# Patient Record
Sex: Female | Born: 1950
Health system: Southern US, Community
[De-identification: ages and names within clinical notes are randomized; demographics above are authoritative.]

## PROBLEM LIST (undated history)

## (undated) DIAGNOSIS — R0600 Dyspnea, unspecified: Secondary | ICD-10-CM

## (undated) DIAGNOSIS — E119 Type 2 diabetes mellitus without complications: Secondary | ICD-10-CM

## (undated) DIAGNOSIS — E7800 Pure hypercholesterolemia, unspecified: Secondary | ICD-10-CM

## (undated) DIAGNOSIS — F32A Depression, unspecified: Secondary | ICD-10-CM

## (undated) DIAGNOSIS — K219 Gastro-esophageal reflux disease without esophagitis: Secondary | ICD-10-CM

## (undated) DIAGNOSIS — M549 Dorsalgia, unspecified: Secondary | ICD-10-CM

## (undated) DIAGNOSIS — K2970 Gastritis, unspecified, without bleeding: Secondary | ICD-10-CM

## (undated) DIAGNOSIS — I82409 Acute embolism and thrombosis of unspecified deep veins of unspecified lower extremity: Secondary | ICD-10-CM

## (undated) DIAGNOSIS — J189 Pneumonia, unspecified organism: Secondary | ICD-10-CM

## (undated) DIAGNOSIS — I509 Heart failure, unspecified: Secondary | ICD-10-CM

## (undated) DIAGNOSIS — M1990 Unspecified osteoarthritis, unspecified site: Secondary | ICD-10-CM

## (undated) DIAGNOSIS — I10 Essential (primary) hypertension: Secondary | ICD-10-CM

## (undated) DIAGNOSIS — F329 Major depressive disorder, single episode, unspecified: Secondary | ICD-10-CM

## (undated) HISTORY — PX: CERVICAL SPINE SURGERY: SHX589

## (undated) HISTORY — DX: Heart failure, unspecified: I50.9

## (undated) HISTORY — DX: Dorsalgia, unspecified: M54.9

## (undated) HISTORY — DX: Major depressive disorder, single episode, unspecified: F32.9

## (undated) HISTORY — PX: APPENDECTOMY: SHX54

## (undated) HISTORY — DX: Depression, unspecified: F32.A

## (undated) HISTORY — PX: DIAGNOSTIC LAPAROSCOPY: SUR761

## (undated) HISTORY — PX: ABDOMINAL HYSTERECTOMY: SHX81

## (undated) HISTORY — DX: Gastro-esophageal reflux disease without esophagitis: K21.9

## (undated) HISTORY — PX: CHOLECYSTECTOMY: SHX55

## (undated) HISTORY — DX: Pure hypercholesterolemia, unspecified: E78.00

## (undated) HISTORY — PX: LUMBAR SPINE SURGERY: SHX701

## (undated) HISTORY — DX: Essential (primary) hypertension: I10

## (undated) HISTORY — PX: BACK SURGERY: SHX140

## (undated) HISTORY — DX: Acute embolism and thrombosis of unspecified deep veins of unspecified lower extremity: I82.409

---

## 2006-02-15 ENCOUNTER — Other Ambulatory Visit: Admission: RE | Admit: 2006-02-15 | Discharge: 2006-02-15 | Payer: Self-pay | Admitting: Family Medicine

## 2006-12-18 ENCOUNTER — Ambulatory Visit (HOSPITAL_COMMUNITY): Admission: RE | Admit: 2006-12-18 | Discharge: 2006-12-19 | Payer: Self-pay | Admitting: Neurosurgery

## 2007-07-29 ENCOUNTER — Ambulatory Visit (HOSPITAL_COMMUNITY): Admission: RE | Admit: 2007-07-29 | Discharge: 2007-07-29 | Payer: Self-pay | Admitting: Neurosurgery

## 2007-08-12 ENCOUNTER — Inpatient Hospital Stay (HOSPITAL_COMMUNITY): Admission: RE | Admit: 2007-08-12 | Discharge: 2007-08-16 | Payer: Self-pay | Admitting: Neurosurgery

## 2011-03-17 NOTE — Op Note (Signed)
NAMELOREL, LEMBO                ACCOUNT NO.:  [id number]   MEDICAL RECORD NO.:  [id number]          PATIENT TYPE:  INP   LOCATION:  8938                         FACILITY:  [facility]   PHYSICIAN:  Leeroy Cha, M.D.   DATE OF BIRTH:  1951-01-23   DATE OF PROCEDURE:  08/12/2007  DATE OF DISCHARGE:                               OPERATIVE REPORT   PREOPERATIVE DIAGNOSIS:  Degenerative disk disease L5-S1 with chronic  radiculopathy.   POSTOPERATIVE DIAGNOSES:  Degenerative disk disease L5-S1 with chronic  radiculopathy.   PROCEDURE:  1. Bilateral L5-S1 discectomy, facetectomy, interbody fusion with      cages, pedicle screws L5-S1.  2. Posterolateral arthrodesis L5-S1 with autograft and bone      morphogenetic protein, CellSaver, and C-arm.   SURGEON:  Leeroy Cha, M.D.   ASSISTANT:  Ashok Pall, M.D.   CLINICAL HISTORY:  The patient,  I have been following her for many  years.  Previously she had a neck fusion, down she had been complaining  of back pain radiating to both legs which is no better with conservative  treatment.  X-rays showed severe degenerative disk disease at L5-S1 with  bilateral foraminotomy.  Surgery was advised.   DESCRIPTION OF PROCEDURE:  The patient was taken to the OR.  She was  positioned in a prone manner.  The skin was cleaned with DuraPrep.  Drapes were applied.  Midline incision from L5-S1 was made.  The muscles  were retracted laterally until we found the lateral aspect of the facet  of L5-S1.  X-rays showed that indeed we were at the level of L5-S1.  Immediately with we saw quite a bit of hypertrophied facet.  We removed  the posterior spinous process, the lamina, and we proceed with a  bilateral facetectomy to gain access to the disk space.  The disk space  was quite narrow, it took [place] a while to use the microcurette to enter  the disk space.  Then used the curette, total discectomy, with removal  of endplate was done.  Then two cages of 10 x  22 with BMP at the tip and  autograft were inserted.   The rest of the disk was filled up with autograft.  Then using the C-arm  first in an AP view and then a lateral view we probed the pedicle of L5-  S1.  Pedicle probe was inserted and then four screws of 5.5 x 45 were  inserted at the level of L5-S1 followed by a rod and caps to keep it in  place.  Having done this, we went laterally and we removed the  periosteum from the lateral aspect of the facet as well as the  transverse process of L5, and the ala of the sacrum.  Then a mix of BMP  and autograft was used for the arthrodesis.  We went back and we placed  again the L5 and S1 nerve root and plenty of space was left for both  areas.  From then on the area was irrigated.  Fentanyl was left in the  epidural space, and the wound  was closed with Vicryl and Steri-Strips.           ______________________________  Leeroy Cha, M.D.    EB/MEDQ  D:  08/12/2007  T:  08/13/2007  Job:  [id number]

## 2011-03-17 NOTE — H&P (Signed)
Tonya Hughes, Tonya Hughes                ACCOUNT NO.:  [id number]   MEDICAL RECORD NO.:  [id number]          PATIENT TYPE:  INP   LOCATION:  3903                         FACILITY:  [facility]   PHYSICIAN:  Leeroy Cha, M.D.   DATE OF BIRTH:  1951/01/30   DATE OF ADMISSION:  08/12/2007  DATE OF DISCHARGE:                              HISTORY & PHYSICAL   Ms. Tonya Hughes is a lady who in the past underwent anterior cervical  diskectomy because of radiculopathy.  The patient did well cervical  wise, but lately she had been complaining of back pain with radiation to  both legs, right one worse than the left one.  She is miserable.  She  has had conservative treatment, and she is not any better.  Because of  the findings, she wants to proceed with surgery.   PAST MEDICAL HISTORY:  1. Cervical diskectomy.  2. Hysterectomy.  3. Appendectomy.  4. Cholecystectomy.   ALLERGIES:  The patient is allergic to PENICILLIN.   SOCIAL HISTORY:  Does not drink.  Smokes a pack of cigarettes a day.   FAMILY HISTORY:  Unremarkable.   REVIEW OF SYSTEMS:  Positive for sinus headaches, high blood pressure,  high cholesterol, endometriosis, and kidney stone.   PHYSICAL EXAMINATION:  HEAD, EARS, NOSE AND THROAT:  Normal.  NECK:  There is a scar.  She has good flexibility.  LUNGS:  Clear.  CARDIAC:  Heart sounds normal.  EXTREMITIES:  Normal pulses.  NEUROLOGIC:  Mental status normal.  Cranial nerves normal.  She has a  weakness in dorsiflexion and plantar flexion of both legs.  Straight leg  raising, SLR, is positive at 45 degrees.  She has a decreased flexion of  the lumbar spine.   The x-ray shows degenerative disk disease at the level of L5-S1 with  bilateral foraminal stenosis.   CLINICAL IMPRESSION:  Degenerative disk disease at L5-S1 with bilateral  chronic S1 radiculopathy.   RECOMMENDATIONS:  The patient is being admitted for surgery.  The  procedure will be L5-S1 diskectomy, interbody fusion with  pedicle screws  and posterolateral arthrodesis.  There risks were explained to her  including the possibility of no improvement whatsoever, need for  surgery, weakness, paralysis, damage to the vessels of the abdomen,  damage to the nerve going to the bladder and bowel.           ______________________________  Leeroy Cha, M.D.     EB/MEDQ  D:  08/12/2007  T:  08/12/2007  Job:  [id number]

## 2011-03-17 NOTE — Discharge Summary (Signed)
[name], [name]                ACCOUNT NO.:  [id number]   MEDICAL RECORD NO.:  [id number]          PATIENT TYPE:  INP   LOCATION:  5465                         FACILITY:  [facility]   PHYSICIAN:  Leeroy Cha, M.D.   DATE OF BIRTH:  [birth year]   DATE OF ADMISSION:  08/12/2007  DATE OF DISCHARGE:  08/16/2007                               DISCHARGE SUMMARY   ADMISSION DIAGNOSIS:  Degenerative disc disease L5-S1 with chronic  radiculopathy status post cervical fusion.   FINAL DIAGNOSIS:  Degenerative disc disease L5-S1 with chronic  radiculopathy status post cervical fusion.   CLINICAL HISTORY:  The patient was admitted because of back pain with  radiation to both legs.  X-rays showed severe degenerative disc disease  at the level 5-1.  Patient wants to proceed with surgery.   LABORATORY:  Normal.   COURSE IN THE HOSPITAL:  The patient was taken to surgery and an L5-S1  fusion was done.  Today, she is ambulating.  She complains of some back  pain and leg pain, but there is no weakness.  She is afebrile.  She is  ready to go home.   CONDITION ON DISCHARGE:  Improvement.   MEDICATIONS:  1. Percocet.  2. Diazepam.  3. Lyrica.   DIET:  Regular.   ACTIVITY:  Not to drive for at least two weeks.   FOLLOWUP:  She will be seen by me in six weeks or before prn           ______________________________  Leeroy Cha, M.D.     EB/MEDQ  D:  08/16/2007  T:  08/16/2007  Job:  [id number]

## 2011-03-20 NOTE — Op Note (Signed)
Tonya Hughes, Tonya Hughes                ACCOUNT NO.:  [id number]   MEDICAL RECORD NO.:  [id number]          PATIENT TYPE:  AMB   LOCATION:  SDS                          FACILITY:  [facility]   PHYSICIAN:  Leeroy Cha, M.D.   DATE OF BIRTH:  Aug 16, 1951   DATE OF PROCEDURE:  12/18/2006  DATE OF DISCHARGE:                               OPERATIVE REPORT   PREOPERATIVE DIAGNOSIS:  C5-C6 herniated disk, with a right C6  radiculopathy.   POSTOPERATIVE DIAGNOSIS:  C5-C6 herniated disk, with a right C6  radiculopathy.   PROCEDURES:  Anterior 5-6 diskectomy, decompression of the spinal cord,  interbody fusion with allograft, plate, microscope used.   SURGEON:  Leeroy Cha, M.D.   ASSISTANT:  Hosie Spangle, M.D.   CLINICAL HISTORY:  The patient is being admitted because of neck pain  with radiation to the right upper extremity.  X-rays showed that she has  cervical stenosis with compromise of the C6 nerve root on the right  side.  Surgery was advised and the risks were explained at the History  and Physical.   DESCRIPTION OF PROCEDURES:  The patient was taken to the OR, and after  intubation, the neck was prepped with DuraPrep.  From then on, a  transverse incision was made in the left side through the skin,  platysma, down to the cervical area.  The patient has a large neck.  X-  rays showed that we were at the level of L4-5.  From then on, we went  down 1 space, and we opened the anterior ligament at the level of 5-6.  We brought the microscope into the area.  A large amount of degenerated  disk was removed.  Then, we opened the posterior ligament.  We found  that from the midline to the right side, there was a combination of  spondylosis, displacing the spinal cord, as well as compromising the C6  nerve root, plus a free herniated disk with a fragment.  Decompression  of the C6 nerve roots bilaterally was achieved.  Decompression medial to  the cervical cord was also achieved.   Then, the endplates were drilled  and allograft that was 8 mm filed down to 7 was inserted, followed by a  plate with 4 screws.  Lateral cervical spine showed good position of the  graft and the plate.  Hemostasis was done with the bipolar.  We waited 5  minutes, and once we were sure that there was not any bleeding, the  wound was closed with Vicryl and Steri-Strips.           ______________________________  Leeroy Cha, M.D.     EB/MEDQ  D:  12/18/2006  T:  12/18/2006  Job:  [id number]

## 2011-08-13 LAB — BASIC METABOLIC PANEL
BUN: 5 — ABNORMAL LOW
CO2: 31
Chloride: 98
Creatinine, Ser: 0.95
GFR calc non Af Amer: >60
Potassium: 3.8
Sodium: 137

## 2011-08-13 LAB — CBC
Hemoglobin: 13.9
RDW: 14.6 — ABNORMAL HIGH
WBC: 12.6 — ABNORMAL HIGH

## 2011-08-13 LAB — TYPE AND SCREEN: ABO/RH(D): A NEG

## 2013-04-18 ENCOUNTER — Other Ambulatory Visit: Payer: Self-pay | Admitting: Neurosurgery

## 2013-04-18 DIAGNOSIS — M5126 Other intervertebral disc displacement, lumbar region: Secondary | ICD-10-CM

## 2013-04-20 ENCOUNTER — Other Ambulatory Visit: Payer: Self-pay | Admitting: Neurosurgery

## 2013-04-20 ENCOUNTER — Ambulatory Visit
Admission: RE | Admit: 2013-04-20 | Discharge: 2013-04-20 | Disposition: A | Discharge status: Home / Self Care | Payer: Medicare Other | Source: Ambulatory Visit | Attending: Neurosurgery | Admitting: Neurosurgery

## 2013-04-20 DIAGNOSIS — M5126 Other intervertebral disc displacement, lumbar region: Secondary | ICD-10-CM

## 2013-04-20 MED ORDER — METHYLPREDNISOLONE ACETATE 40 MG/ML INJ SUSP (RADIOLOG
120.0000 mg | Freq: Once | INTRAMUSCULAR | Status: AC
  Administered 2013-04-20: 120 mg via EPIDURAL

## 2013-04-20 MED ORDER — IOHEXOL 180 MG/ML  SOLN
1.0000 mL | Freq: Once | INTRAMUSCULAR | Status: AC | PRN
  Administered 2013-04-20: 1 mL via EPIDURAL

## 2013-04-28 ENCOUNTER — Other Ambulatory Visit: Payer: Self-pay | Admitting: Neurosurgery

## 2013-04-28 DIAGNOSIS — M5126 Other intervertebral disc displacement, lumbar region: Secondary | ICD-10-CM

## 2013-05-04 ENCOUNTER — Ambulatory Visit
Admission: RE | Admit: 2013-05-04 | Discharge: 2013-05-04 | Disposition: A | Discharge status: Home / Self Care | Payer: Medicare Other | Source: Ambulatory Visit | Attending: Neurosurgery | Admitting: Neurosurgery

## 2013-05-04 ENCOUNTER — Other Ambulatory Visit: Payer: Self-pay | Admitting: Neurosurgery

## 2013-05-04 DIAGNOSIS — M5126 Other intervertebral disc displacement, lumbar region: Secondary | ICD-10-CM

## 2013-05-04 MED ORDER — METHYLPREDNISOLONE ACETATE 40 MG/ML INJ SUSP (RADIOLOG
120.0000 mg | Freq: Once | INTRAMUSCULAR | Status: AC
  Administered 2013-05-04: 120 mg via EPIDURAL

## 2013-05-04 MED ORDER — IOHEXOL 180 MG/ML  SOLN
1.0000 mL | Freq: Once | INTRAMUSCULAR | Status: AC | PRN
  Administered 2013-05-04: 1 mL via EPIDURAL

## 2013-06-23 ENCOUNTER — Other Ambulatory Visit: Payer: Self-pay | Admitting: Neurosurgery

## 2013-06-23 DIAGNOSIS — M5126 Other intervertebral disc displacement, lumbar region: Secondary | ICD-10-CM

## 2013-06-28 ENCOUNTER — Other Ambulatory Visit: Payer: Self-pay | Admitting: Neurosurgery

## 2013-06-28 ENCOUNTER — Ambulatory Visit
Admission: RE | Admit: 2013-06-28 | Discharge: 2013-06-28 | Disposition: A | Discharge status: Home / Self Care | Payer: Medicare Other | Source: Ambulatory Visit | Attending: Neurosurgery | Admitting: Neurosurgery

## 2013-06-28 VITALS — BP 148/76 | HR 70

## 2013-06-28 DIAGNOSIS — M5126 Other intervertebral disc displacement, lumbar region: Secondary | ICD-10-CM

## 2013-06-28 MED ORDER — IOHEXOL 180 MG/ML  SOLN
1.0000 mL | Freq: Once | INTRAMUSCULAR | Status: AC | PRN
  Administered 2013-06-28: 1 mL via EPIDURAL

## 2013-06-28 MED ORDER — METHYLPREDNISOLONE ACETATE 40 MG/ML INJ SUSP (RADIOLOG
120.0000 mg | Freq: Once | INTRAMUSCULAR | Status: AC
  Administered 2013-06-28: 120 mg via EPIDURAL

## 2013-12-04 ENCOUNTER — Ambulatory Visit: Payer: Medicare Other | Attending: Neurosurgery | Admitting: Physical Therapy

## 2013-12-04 DIAGNOSIS — M545 Low back pain, unspecified: Secondary | ICD-10-CM | POA: Insufficient documentation

## 2013-12-04 DIAGNOSIS — R5381 Other malaise: Secondary | ICD-10-CM | POA: Insufficient documentation

## 2013-12-04 DIAGNOSIS — IMO0001 Reserved for inherently not codable concepts without codable children: Secondary | ICD-10-CM | POA: Insufficient documentation

## 2013-12-07 ENCOUNTER — Ambulatory Visit: Payer: Medicare Other | Admitting: Physical Therapy

## 2013-12-12 ENCOUNTER — Ambulatory Visit: Payer: Medicare Other | Admitting: Physical Therapy

## 2013-12-14 ENCOUNTER — Ambulatory Visit: Payer: Medicare Other | Admitting: Physical Therapy

## 2013-12-19 ENCOUNTER — Encounter: Payer: Medicare Other | Admitting: Physical Therapy

## 2013-12-21 ENCOUNTER — Ambulatory Visit: Payer: Medicare Other | Admitting: Physical Therapy

## 2013-12-26 ENCOUNTER — Encounter: Payer: Medicare Other | Admitting: Physical Therapy

## 2013-12-28 ENCOUNTER — Encounter: Payer: Medicare Other | Admitting: Physical Therapy

## 2014-01-02 ENCOUNTER — Ambulatory Visit: Payer: Medicare Other | Attending: Neurosurgery | Admitting: Physical Therapy

## 2014-01-02 DIAGNOSIS — M545 Low back pain, unspecified: Secondary | ICD-10-CM | POA: Insufficient documentation

## 2014-01-02 DIAGNOSIS — R5381 Other malaise: Secondary | ICD-10-CM | POA: Insufficient documentation

## 2014-01-04 ENCOUNTER — Ambulatory Visit: Payer: Medicare Other | Admitting: Physical Therapy

## 2014-01-09 ENCOUNTER — Ambulatory Visit: Payer: Medicare Other | Admitting: Physical Therapy

## 2014-01-11 ENCOUNTER — Encounter: Payer: Medicare Other | Admitting: Physical Therapy

## 2014-01-16 ENCOUNTER — Ambulatory Visit: Payer: Medicare Other | Admitting: Physical Therapy

## 2014-01-23 ENCOUNTER — Ambulatory Visit: Payer: Medicare Other | Admitting: Physical Therapy

## 2014-01-25 ENCOUNTER — Ambulatory Visit: Payer: Medicare Other | Admitting: Physical Therapy

## 2014-01-30 ENCOUNTER — Ambulatory Visit: Payer: Medicare Other | Admitting: Physical Therapy

## 2014-02-01 ENCOUNTER — Ambulatory Visit: Payer: Medicare Other | Attending: Neurosurgery | Admitting: Physical Therapy

## 2014-02-01 DIAGNOSIS — M545 Low back pain, unspecified: Secondary | ICD-10-CM | POA: Insufficient documentation

## 2014-02-01 DIAGNOSIS — R5381 Other malaise: Secondary | ICD-10-CM | POA: Insufficient documentation

## 2014-02-06 ENCOUNTER — Ambulatory Visit: Payer: Medicare Other | Admitting: Physical Therapy

## 2014-02-08 ENCOUNTER — Ambulatory Visit: Payer: Medicare Other | Admitting: Physical Therapy

## 2015-11-05 DIAGNOSIS — R5383 Other fatigue: Secondary | ICD-10-CM | POA: Diagnosis not present

## 2015-11-05 DIAGNOSIS — I10 Essential (primary) hypertension: Secondary | ICD-10-CM | POA: Diagnosis not present

## 2015-11-05 DIAGNOSIS — Z418 Encounter for other procedures for purposes other than remedying health state: Secondary | ICD-10-CM | POA: Diagnosis not present

## 2015-11-05 DIAGNOSIS — Z1211 Encounter for screening for malignant neoplasm of colon: Secondary | ICD-10-CM | POA: Diagnosis not present

## 2015-11-05 DIAGNOSIS — Z6829 Body mass index (BMI) 29.0-29.9, adult: Secondary | ICD-10-CM | POA: Diagnosis not present

## 2015-11-05 DIAGNOSIS — E7800 Pure hypercholesterolemia, unspecified: Secondary | ICD-10-CM | POA: Diagnosis not present

## 2015-11-05 DIAGNOSIS — Z0000 Encounter for general adult medical examination without abnormal findings: Secondary | ICD-10-CM | POA: Diagnosis not present

## 2015-11-05 DIAGNOSIS — F329 Major depressive disorder, single episode, unspecified: Secondary | ICD-10-CM | POA: Diagnosis not present

## 2015-11-05 DIAGNOSIS — Z7189 Other specified counseling: Secondary | ICD-10-CM | POA: Diagnosis not present

## 2015-11-05 DIAGNOSIS — Z1389 Encounter for screening for other disorder: Secondary | ICD-10-CM | POA: Diagnosis not present

## 2016-05-18 DIAGNOSIS — Z6830 Body mass index (BMI) 30.0-30.9, adult: Secondary | ICD-10-CM | POA: Diagnosis not present

## 2016-05-18 DIAGNOSIS — M542 Cervicalgia: Secondary | ICD-10-CM | POA: Diagnosis not present

## 2016-05-18 DIAGNOSIS — E7800 Pure hypercholesterolemia, unspecified: Secondary | ICD-10-CM | POA: Diagnosis not present

## 2016-05-18 DIAGNOSIS — Z720 Tobacco use: Secondary | ICD-10-CM | POA: Diagnosis not present

## 2016-05-18 DIAGNOSIS — Z299 Encounter for prophylactic measures, unspecified: Secondary | ICD-10-CM | POA: Diagnosis not present

## 2016-05-18 DIAGNOSIS — I10 Essential (primary) hypertension: Secondary | ICD-10-CM | POA: Diagnosis not present

## 2016-05-18 DIAGNOSIS — F329 Major depressive disorder, single episode, unspecified: Secondary | ICD-10-CM | POA: Diagnosis not present

## 2016-05-25 DIAGNOSIS — Z791 Long term (current) use of non-steroidal anti-inflammatories (NSAID): Secondary | ICD-10-CM | POA: Diagnosis not present

## 2016-05-25 DIAGNOSIS — F329 Major depressive disorder, single episode, unspecified: Secondary | ICD-10-CM | POA: Diagnosis not present

## 2016-05-25 DIAGNOSIS — Z91030 Bee allergy status: Secondary | ICD-10-CM | POA: Diagnosis not present

## 2016-05-25 DIAGNOSIS — F17200 Nicotine dependence, unspecified, uncomplicated: Secondary | ICD-10-CM | POA: Diagnosis not present

## 2016-05-25 DIAGNOSIS — L500 Allergic urticaria: Secondary | ICD-10-CM | POA: Diagnosis not present

## 2016-05-25 DIAGNOSIS — Z880 Allergy status to penicillin: Secondary | ICD-10-CM | POA: Diagnosis not present

## 2016-05-25 DIAGNOSIS — Z887 Allergy status to serum and vaccine status: Secondary | ICD-10-CM | POA: Diagnosis not present

## 2016-05-25 DIAGNOSIS — I10 Essential (primary) hypertension: Secondary | ICD-10-CM | POA: Diagnosis not present

## 2016-05-25 DIAGNOSIS — Z79899 Other long term (current) drug therapy: Secondary | ICD-10-CM | POA: Diagnosis not present

## 2016-05-26 DIAGNOSIS — F329 Major depressive disorder, single episode, unspecified: Secondary | ICD-10-CM | POA: Diagnosis not present

## 2016-05-26 DIAGNOSIS — L509 Urticaria, unspecified: Secondary | ICD-10-CM | POA: Diagnosis not present

## 2016-05-26 DIAGNOSIS — I10 Essential (primary) hypertension: Secondary | ICD-10-CM | POA: Diagnosis not present

## 2016-05-26 DIAGNOSIS — E7800 Pure hypercholesterolemia, unspecified: Secondary | ICD-10-CM | POA: Diagnosis not present

## 2016-05-27 DIAGNOSIS — F329 Major depressive disorder, single episode, unspecified: Secondary | ICD-10-CM | POA: Diagnosis not present

## 2016-05-27 DIAGNOSIS — L509 Urticaria, unspecified: Secondary | ICD-10-CM | POA: Diagnosis not present

## 2016-05-27 DIAGNOSIS — I10 Essential (primary) hypertension: Secondary | ICD-10-CM | POA: Diagnosis not present

## 2016-12-09 ENCOUNTER — Other Ambulatory Visit: Payer: Self-pay | Admitting: Internal Medicine

## 2016-12-09 DIAGNOSIS — R921 Mammographic calcification found on diagnostic imaging of breast: Secondary | ICD-10-CM

## 2016-12-21 ENCOUNTER — Ambulatory Visit
Admission: RE | Admit: 2016-12-21 | Discharge: 2016-12-21 | Disposition: A | Discharge status: Home / Self Care | Payer: Medicare Other | Source: Ambulatory Visit | Attending: Internal Medicine | Admitting: Internal Medicine

## 2016-12-21 DIAGNOSIS — R921 Mammographic calcification found on diagnostic imaging of breast: Secondary | ICD-10-CM

## 2017-02-25 DIAGNOSIS — M5416 Radiculopathy, lumbar region: Secondary | ICD-10-CM | POA: Insufficient documentation

## 2017-03-27 DIAGNOSIS — M6281 Muscle weakness (generalized): Secondary | ICD-10-CM | POA: Diagnosis not present

## 2017-03-27 DIAGNOSIS — R2689 Other abnormalities of gait and mobility: Secondary | ICD-10-CM | POA: Diagnosis not present

## 2017-03-27 DIAGNOSIS — I10 Essential (primary) hypertension: Secondary | ICD-10-CM | POA: Diagnosis not present

## 2017-03-27 DIAGNOSIS — M545 Low back pain: Secondary | ICD-10-CM | POA: Diagnosis not present

## 2017-03-27 DIAGNOSIS — Z887 Allergy status to serum and vaccine status: Secondary | ICD-10-CM | POA: Diagnosis not present

## 2017-03-27 DIAGNOSIS — F329 Major depressive disorder, single episode, unspecified: Secondary | ICD-10-CM | POA: Diagnosis not present

## 2017-03-27 DIAGNOSIS — Z880 Allergy status to penicillin: Secondary | ICD-10-CM | POA: Diagnosis not present

## 2017-03-27 DIAGNOSIS — Z4789 Encounter for other orthopedic aftercare: Secondary | ICD-10-CM | POA: Diagnosis not present

## 2017-03-27 DIAGNOSIS — M4726 Other spondylosis with radiculopathy, lumbar region: Secondary | ICD-10-CM | POA: Diagnosis not present

## 2017-03-27 DIAGNOSIS — M48061 Spinal stenosis, lumbar region without neurogenic claudication: Secondary | ICD-10-CM | POA: Diagnosis not present

## 2017-04-21 DIAGNOSIS — Z981 Arthrodesis status: Secondary | ICD-10-CM | POA: Insufficient documentation

## 2017-05-31 DIAGNOSIS — S32009A Unspecified fracture of unspecified lumbar vertebra, initial encounter for closed fracture: Secondary | ICD-10-CM | POA: Insufficient documentation

## 2017-07-26 DIAGNOSIS — D62 Acute posthemorrhagic anemia: Secondary | ICD-10-CM | POA: Diagnosis not present

## 2017-07-26 DIAGNOSIS — R278 Other lack of coordination: Secondary | ICD-10-CM | POA: Diagnosis not present

## 2017-07-26 DIAGNOSIS — G8929 Other chronic pain: Secondary | ICD-10-CM | POA: Diagnosis not present

## 2017-07-26 DIAGNOSIS — Z981 Arthrodesis status: Secondary | ICD-10-CM | POA: Diagnosis not present

## 2017-07-26 DIAGNOSIS — I824Z2 Acute embolism and thrombosis of unspecified deep veins of left distal lower extremity: Secondary | ICD-10-CM | POA: Diagnosis not present

## 2017-07-26 DIAGNOSIS — Z4789 Encounter for other orthopedic aftercare: Secondary | ICD-10-CM | POA: Diagnosis not present

## 2017-07-26 DIAGNOSIS — R2689 Other abnormalities of gait and mobility: Secondary | ICD-10-CM | POA: Diagnosis not present

## 2017-07-26 DIAGNOSIS — M6281 Muscle weakness (generalized): Secondary | ICD-10-CM | POA: Diagnosis not present

## 2017-07-26 DIAGNOSIS — F329 Major depressive disorder, single episode, unspecified: Secondary | ICD-10-CM | POA: Diagnosis not present

## 2017-07-26 DIAGNOSIS — E871 Hypo-osmolality and hyponatremia: Secondary | ICD-10-CM | POA: Diagnosis not present

## 2017-07-26 DIAGNOSIS — I10 Essential (primary) hypertension: Secondary | ICD-10-CM | POA: Diagnosis not present

## 2017-08-27 DIAGNOSIS — I824Y1 Acute embolism and thrombosis of unspecified deep veins of right proximal lower extremity: Secondary | ICD-10-CM | POA: Insufficient documentation

## 2017-12-22 ENCOUNTER — Encounter: Payer: Self-pay | Admitting: *Deleted

## 2017-12-23 ENCOUNTER — Encounter: Payer: Self-pay | Admitting: Cardiovascular Disease

## 2017-12-23 ENCOUNTER — Encounter: Payer: Self-pay | Admitting: *Deleted

## 2017-12-23 ENCOUNTER — Ambulatory Visit: Payer: Medicare Other | Admitting: Cardiovascular Disease

## 2017-12-23 ENCOUNTER — Other Ambulatory Visit: Payer: Self-pay

## 2017-12-23 ENCOUNTER — Telehealth: Payer: Self-pay | Admitting: Cardiovascular Disease

## 2017-12-23 VITALS — BP 138/64 | HR 84 | Ht 63.0 in | Wt 135.0 lb

## 2017-12-23 DIAGNOSIS — I428 Other cardiomyopathies: Secondary | ICD-10-CM | POA: Diagnosis not present

## 2017-12-23 DIAGNOSIS — Z9289 Personal history of other medical treatment: Secondary | ICD-10-CM | POA: Diagnosis not present

## 2017-12-23 DIAGNOSIS — I10 Essential (primary) hypertension: Secondary | ICD-10-CM | POA: Diagnosis not present

## 2017-12-23 DIAGNOSIS — I5042 Chronic combined systolic (congestive) and diastolic (congestive) heart failure: Secondary | ICD-10-CM

## 2017-12-23 DIAGNOSIS — D500 Iron deficiency anemia secondary to blood loss (chronic): Secondary | ICD-10-CM

## 2017-12-23 DIAGNOSIS — Z720 Tobacco use: Secondary | ICD-10-CM | POA: Diagnosis not present

## 2017-12-23 MED ORDER — CARVEDILOL 3.125 MG PO TABS: 3.1250 mg | ORAL_TABLET | Freq: Two times a day (BID) | ORAL | 6 refills | Status: DC

## 2017-12-23 MED ORDER — POTASSIUM CHLORIDE CRYS ER 20 MEQ PO TBCR: 20.0000 meq | EXTENDED_RELEASE_TABLET | Freq: Every day | ORAL | 3 refills | Status: DC

## 2017-12-23 NOTE — Patient Instructions (Signed)
Medication Instructions:   Remain off of the Spironolactone.  Begin Coreg 3.115m twice a day.  Continue all other current medications.  Labwork: none  Testing/Procedures:  Your physician has requested that you have a lexiscan myoview. For further information please visit [email or web address] Please follow instruction sheet, as given.  Office will contact with results via phone or letter.    Follow-Up: 2 months   Any Other Special Instructions Will Be Listed Below (If Applicable).  If you need a refill on your cardiac medications before your next appointment, please call your pharmacy.

## 2017-12-23 NOTE — Telephone Encounter (Signed)
Pre-cert Verification for the following procedure   lexiscan myoview scheduled for 12-31-17 at [facility]

## 2017-12-23 NOTE — Progress Notes (Signed)
CARDIOLOGY CONSULT NOTE  Patient ID: Tonya Hughes MRN: [id number] DOB/AGE: Oct 23, 1951 67 y.o.  Admit date: (Not on file) Primary Physician: Monico Blitz, MD Referring Physician: Dr. Manuella Ghazi  Reason for Consultation: CHF  HPI: Tonya Hughes is a 67 y.o. female who is being seen today for the evaluation of CHF at the request of Monico Blitz, MD.   I reviewed notes from her PCP.  She was recently started on Lasix and spironolactone.  She has a history of right leg DVT after back surgery and had been on Xarelto.  Past medical history also includes hypertension and tobacco abuse.  It appears she was hospitalized at [facility] in January 2019.  I reviewed all relevant documentation, labs, and studies.  She was noted to be anemic and received 2 units of packed red blood cells.    I reviewed an echocardiogram performed on 11/19/17 which demonstrated mildly reduced left ventricular systolic function, LVEF 93%, mild concentric LVH, grade 2 diastolic dysfunction, mild left atrial dilatation, and mild mitral and pulmonic regurgitation.  Pulmonary pressures were elevated at 43 mmHg.  CVP was 15 mmHg.  I do not have a copy of her ECG or most recent labs so I will have my staff request these.  She said she seldom has chest pains.  She denies any significant shortness of breath as well as leg swelling, orthopnea, palpitations, syncope, and paroxysmal nocturnal dyspnea.  She told me she underwent back surgery on 07/16/17 and has had some right foot numbness ever since.  She has been using a cane recently.  She tells me she underwent upper endoscopy and colonoscopy while hospitalized.  She said she was diagnosed with gastritis and a hiatal hernia.  She currently denies hematochezia and melena.  She has been smoking a pack and a half of cigarettes daily for at least 50 years.  She ran out of spironolactone and potassium yesterday.  Allergies  Allergen Reactions  . Zanaflex [Tizanidine Hcl]     . Penicillins Itching and Rash    Current Outpatient Medications  Medication Sig Dispense Refill  . furosemide (LASIX) 40 MG tablet Take 40 mg by mouth daily.    Marland Kitchen gabapentin (NEURONTIN) 100 MG capsule Take 100 mg by mouth 3 (three) times daily.    Marland Kitchen HYDROcodone-acetaminophen (NORCO/VICODIN) 5-325 MG tablet Take by mouth.    Marland Kitchen lisinopril (PRINIVIL,ZESTRIL) 5 MG tablet Take 5 mg by mouth daily.    Marland Kitchen PARoxetine (PAXIL) 20 MG tablet Take 20 mg by mouth daily.    . potassium chloride SA (K-DUR,KLOR-CON) 20 MEQ tablet Take 20 mEq by mouth daily.    Marland Kitchen spironolactone (ALDACTONE) 25 MG tablet Take 25 mg by mouth daily.     No current facility-administered medications for this visit.     Past Medical History:  Diagnosis Date  . Back pain   . Congestive heart failure (CHF) ([place])   . Depressive disorder   . DVT (deep venous thrombosis) ([place])    Right LE s/p back surgery stop date lovenox 09/05/17  . GERD (gastroesophageal reflux disease)   . Hypercholesteremia   . Hypertension     Past Surgical History:  Procedure Laterality Date  . ABDOMINAL HYSTERECTOMY    . CERVICAL SPINE SURGERY    . CHOLECYSTECTOMY    . LUMBAR SPINE SURGERY      Social History   Socioeconomic History  . Marital status: Married    Spouse name: Not on file  . Number of  children: Not on file  . Years of education: Not on file  . Highest education level: Not on file  Social Needs  . Financial resource strain: Not on file  . Food insecurity - worry: Not on file  . Food insecurity - inability: Not on file  . Transportation needs - medical: Not on file  . Transportation needs - non-medical: Not on file  Occupational History  . Not on file  Tobacco Use  . Smoking status: Current Every Day Smoker    Packs/day: 0.80    Years: 40.00    Pack years: 32.00    Types: Cigarettes  . Smokeless tobacco: Never Used  Substance and Sexual Activity  . Alcohol use: Not on file  . Drug use: Not on file  . Sexual  activity: Not on file  Other Topics Concern  . Not on file  Social History Narrative  . Not on file     No family history of premature CAD in 1st degree relatives.  Current Meds  Medication Sig  . furosemide (LASIX) 40 MG tablet Take 40 mg by mouth daily.  Marland Kitchen gabapentin (NEURONTIN) 100 MG capsule Take 100 mg by mouth 3 (three) times daily.  Marland Kitchen HYDROcodone-acetaminophen (NORCO/VICODIN) 5-325 MG tablet Take by mouth.  Marland Kitchen lisinopril (PRINIVIL,ZESTRIL) 5 MG tablet Take 5 mg by mouth daily.  Marland Kitchen PARoxetine (PAXIL) 20 MG tablet Take 20 mg by mouth daily.  . potassium chloride SA (K-DUR,KLOR-CON) 20 MEQ tablet Take 20 mEq by mouth daily.  Marland Kitchen spironolactone (ALDACTONE) 25 MG tablet Take 25 mg by mouth daily.      Review of systems complete and found to be negative unless listed above in HPI    Physical exam Blood pressure 138/64, pulse 84, height 5' 3"  (1.6 m), weight 135 lb (61.2 kg), SpO2 98 %. General: NAD Neck: No JVD, no thyromegaly or thyroid nodule.  Lungs: Poor air movement with prolonged expiratory phase and faint expiratory rhonchi bilaterally.  No crackles. CV: Nondisplaced PMI. Regular rate and rhythm, normal S1/S2, no S3/S4, no murmur.  No peripheral edema.   Abdomen: Soft, nontender, no distention.  Skin: Intact without lesions or rashes.  Neurologic: Alert and oriented x 3.  Psych: Normal affect. Extremities: No clubbing or cyanosis.  HEENT: Normal.   ECG: Most recent ECG reviewed.   Labs: Lab Results  Component Value Date/Time   K 3.8 08/10/2007 09:52 AM   BUN 5 (L) 08/10/2007 09:52 AM   CREATININE 0.95 08/10/2007 09:52 AM   HGB 13.9 08/10/2007 09:52 AM     Lipids: No results found for: LDLCALC, LDLDIRECT, CHOL, TRIG, HDL      ASSESSMENT AND PLAN:  1.  Chronic combined systolic and diastolic heart failure/cardiomyopathy: She appears euvolemic and symptomatically stable.  She ran out of spironolactone and potassium yesterday.  I will refill potassium but not  spironolactone.  I will keep her maintained on Lasix 40 mg daily.  She is on lisinopril 5 mg daily.  I will start low-dose carvedilol 3.125 mg twice daily.  I will also obtain a Lexiscan Myoview stress test to evaluate for ischemic heart disease.  I will obtain a copy of her ECG.  2.  Hypertension: Blood pressure is controlled.  I am starting low-dose carvedilol 3.125 mg twice daily.  3.  Tobacco abuse: Currently smoking 1.5 packs of cigarettes daily.  4.  Acute blood loss anemia: I will obtain most recent labs.   Disposition: Follow up in 2 months  Signed: Kate Sable,  M.D., F.A.C.C.  12/23/2017, 10:45 AM

## 2017-12-29 ENCOUNTER — Encounter: Payer: Self-pay | Admitting: *Deleted

## 2017-12-31 ENCOUNTER — Ambulatory Visit (HOSPITAL_COMMUNITY): Payer: Medicare Other

## 2018-01-10 ENCOUNTER — Ambulatory Visit (HOSPITAL_COMMUNITY): Payer: Medicare Other

## 2018-01-10 ENCOUNTER — Encounter (HOSPITAL_COMMUNITY): Payer: Medicare Other

## 2018-01-12 ENCOUNTER — Encounter (HOSPITAL_BASED_OUTPATIENT_CLINIC_OR_DEPARTMENT_OTHER)
Admission: RE | Admit: 2018-01-12 | Discharge: 2018-01-12 | Disposition: A | Discharge status: Home / Self Care | Payer: Medicare Other | Source: Ambulatory Visit | Attending: Cardiovascular Disease | Admitting: Cardiovascular Disease

## 2018-01-12 ENCOUNTER — Encounter (HOSPITAL_COMMUNITY)
Admission: RE | Admit: 2018-01-12 | Discharge: 2018-01-12 | Disposition: A | Discharge status: Home / Self Care | Payer: Medicare Other | Source: Ambulatory Visit | Attending: Cardiovascular Disease | Admitting: Cardiovascular Disease

## 2018-01-12 ENCOUNTER — Encounter (HOSPITAL_COMMUNITY): Payer: Self-pay

## 2018-01-12 DIAGNOSIS — I428 Other cardiomyopathies: Secondary | ICD-10-CM | POA: Diagnosis not present

## 2018-01-12 LAB — NM MYOCAR MULTI W/SPECT W/WALL MOTION / EF
LV dias vol: 66 mL (ref 46–106)
LV sys vol: 21 mL
NUC STRESS TID: 1.54
Peak HR: 102 {beats}/min
RATE: 0.42
Rest HR: 64 {beats}/min
SDS: 0
SRS: 1
SSS: 1

## 2018-01-12 MED ORDER — SODIUM CHLORIDE 0.9% FLUSH
INTRAVENOUS | Status: AC
  Administered 2018-01-12: 10 mL via INTRAVENOUS
  Filled 2018-01-12: qty 10

## 2018-01-12 MED ORDER — TECHNETIUM TC 99M TETROFOSMIN IV KIT
10.0000 | PACK | Freq: Once | INTRAVENOUS | Status: AC | PRN
  Administered 2018-01-12: 11 via INTRAVENOUS

## 2018-01-12 MED ORDER — REGADENOSON 0.4 MG/5ML IV SOLN
INTRAVENOUS | Status: AC
  Administered 2018-01-12: 0.4 mg via INTRAVENOUS
  Filled 2018-01-12: qty 5

## 2018-01-12 MED ORDER — TECHNETIUM TC 99M TETROFOSMIN IV KIT
30.0000 | PACK | Freq: Once | INTRAVENOUS | Status: AC | PRN
  Administered 2018-01-12: 33 via INTRAVENOUS

## 2018-02-23 ENCOUNTER — Ambulatory Visit: Payer: Medicare Other | Admitting: Cardiovascular Disease

## 2018-03-25 ENCOUNTER — Other Ambulatory Visit: Payer: Self-pay | Admitting: Cardiovascular Disease

## 2018-06-29 ENCOUNTER — Other Ambulatory Visit: Payer: Self-pay | Admitting: Cardiovascular Disease

## 2018-10-04 ENCOUNTER — Other Ambulatory Visit: Payer: Self-pay | Admitting: Cardiovascular Disease

## 2019-01-03 ENCOUNTER — Other Ambulatory Visit: Payer: Self-pay | Admitting: Cardiovascular Disease

## 2019-09-21 ENCOUNTER — Other Ambulatory Visit: Payer: Self-pay | Admitting: Cardiovascular Disease

## 2019-10-05 ENCOUNTER — Telehealth: Payer: Self-pay | Admitting: *Deleted

## 2019-10-06 ENCOUNTER — Other Ambulatory Visit: Payer: Self-pay

## 2019-10-06 ENCOUNTER — Encounter: Payer: Self-pay | Admitting: Vascular Surgery

## 2019-10-06 ENCOUNTER — Encounter: Payer: Self-pay | Admitting: *Deleted

## 2019-10-06 ENCOUNTER — Ambulatory Visit (INDEPENDENT_AMBULATORY_CARE_PROVIDER_SITE_OTHER): Payer: Medicare Other | Admitting: Vascular Surgery

## 2019-10-06 VITALS — BP 128/60 | HR 68 | Temp 97.6°F | Resp 20 | Ht 63.0 in | Wt 177.0 lb

## 2019-10-06 DIAGNOSIS — I509 Heart failure, unspecified: Secondary | ICD-10-CM | POA: Insufficient documentation

## 2019-10-06 DIAGNOSIS — F32A Depression, unspecified: Secondary | ICD-10-CM | POA: Insufficient documentation

## 2019-10-06 DIAGNOSIS — I739 Peripheral vascular disease, unspecified: Secondary | ICD-10-CM

## 2019-10-06 DIAGNOSIS — I10 Essential (primary) hypertension: Secondary | ICD-10-CM | POA: Insufficient documentation

## 2019-10-06 DIAGNOSIS — M549 Dorsalgia, unspecified: Secondary | ICD-10-CM | POA: Insufficient documentation

## 2019-10-06 DIAGNOSIS — F329 Major depressive disorder, single episode, unspecified: Secondary | ICD-10-CM | POA: Insufficient documentation

## 2019-10-06 DIAGNOSIS — K219 Gastro-esophageal reflux disease without esophagitis: Secondary | ICD-10-CM | POA: Insufficient documentation

## 2019-10-06 DIAGNOSIS — E7800 Pure hypercholesterolemia, unspecified: Secondary | ICD-10-CM | POA: Insufficient documentation

## 2019-10-06 NOTE — Progress Notes (Signed)
Patient ID: Tonya Hughes, female   DOB: 1951-05-30, 68 y.o.   MRN: [id number]  Reason for Consult: New Patient (Initial Visit)   Referred by Monico Blitz, MD  Subjective:     HPI:  Tonya Hughes is a 68 y.o. female with history of bilateral lower extremity right greater than left pain.  She has a history of congestive heart failure.  Recent underwent ABIs were decreased bilaterally.  She does not take any antiplatelet agents.  She has never had allergy to any statins does not take that either.  Previously was on Lovenox for right lower extremity DVT.  States that she has pain with walking this is somewhat from bunions on the foot but the right foot also hurts just with standing particularly in the calf muscle.  States that she has foot pain at rest as well.  Does have some back issues that caused some of her posterior thigh pain as well.  No tissue loss or ulceration at this time.  She continues to smoke daily.  Past Medical History:  Diagnosis Date  . Back pain   . Congestive heart failure (CHF) ([place])   . Depressive disorder   . DVT (deep venous thrombosis) ([place])    Right LE s/p back surgery stop date lovenox 09/05/17  . GERD (gastroesophageal reflux disease)   . Hypercholesteremia   . Hypertension    History reviewed. No pertinent family history. Past Surgical History:  Procedure Laterality Date  . ABDOMINAL HYSTERECTOMY    . CERVICAL SPINE SURGERY    . CHOLECYSTECTOMY    . LUMBAR SPINE SURGERY      Short Social History:  Social History   Tobacco Use  . Smoking status: Current Every Day Smoker    Packs/day: 0.80    Years: 40.00    Pack years: 32.00    Types: Cigarettes  . Smokeless tobacco: Never Used  Substance Use Topics  . Alcohol use: Not Currently    Allergies  Allergen Reactions  . Zanaflex [Tizanidine Hcl]   . Penicillins Itching and Rash    Current Outpatient Medications  Medication Sig Dispense Refill  . carvedilol (COREG) 3.125 MG tablet TAKE ONE  TABLET BY MOUTH TWICE DAILY 4 tablet 0  . fexofenadine (ALLEGRA) 180 MG tablet Take 180 mg by mouth daily.    . furosemide (LASIX) 40 MG tablet Take 40 mg by mouth daily.    Marland Kitchen lisinopril (PRINIVIL,ZESTRIL) 5 MG tablet Take 5 mg by mouth daily.    Marland Kitchen PARoxetine (PAXIL) 20 MG tablet Take 20 mg by mouth daily.    . potassium chloride SA (K-DUR,KLOR-CON) 20 MEQ tablet TAKE ONE (1) TABLET EACH DAY 7 tablet 0   No current facility-administered medications for this visit.     Review of Systems  Constitutional:  Constitutional negative. HENT: HENT negative.  Eyes: Eyes negative.  Respiratory: Respiratory negative.  Cardiovascular: Cardiovascular negative.  Musculoskeletal: Positive for back pain and leg pain.  Skin: Skin negative.  Hematologic: Hematologic/lymphatic negative.  Psychiatric: Psychiatric negative.        Objective:  Objective   Vitals:   10/06/19 1351  BP: 128/60  Pulse: 68  Resp: 20  Temp: 97.6 F (36.4 C)  SpO2: 95%  Weight: 177 lb (80.3 kg)  Height: 5' 3"  (1.6 m)   Body mass index is 31.35 kg/m.  Physical Exam Constitutional:      Appearance: She is obese.  HENT:     Head: Normocephalic.     Nose:  Nose normal.     Mouth/Throat:     Mouth: Mucous membranes are moist.  Eyes:     Pupils: Pupils are equal, round, and reactive to light.  Neck:     Musculoskeletal: Normal range of motion.     Vascular: No carotid bruit.  Cardiovascular:     Pulses:          Radial pulses are 2+ on the right side and 2+ on the left side.       Femoral pulses are 1+ on the right side and 1+ on the left side.      Popliteal pulses are 0 on the right side and 0 on the left side.  Abdominal:     General: Abdomen is flat.     Palpations: Abdomen is soft.  Skin:    General: Skin is warm and dry.     Capillary Refill: Capillary refill takes 2 to 3 seconds.  Neurological:     General: No focal deficit present.     Mental Status: She is alert.  Psychiatric:        Mood and  Affect: Mood normal.        Behavior: Behavior normal.        Thought Content: Thought content normal.        Judgment: Judgment normal.     Data: ABIs right 0.57 left 0.67     Assessment/Plan:     68 year old female with right greater than left lower extremity pain moderately depressed ABIs bilaterally.  She continues to smoke.  She does have decreased capillary refill particularly on the right does not have wounds at this time.  I have offered watchful waiting versus aortogram possible invention on the right from a left common femoral approach.  This time she wants to proceed with this.  I have discussed smoking cessation.  She will also start baby aspirin.  If we intervene she will likely need Plavix at least for a short time.  She should also be placed on a statin drug given that she has no allergy.  We will get her set up for aortogram possible invention on the right after the new year.     Waynetta Sandy MD [facility]

## 2019-10-26 ENCOUNTER — Encounter: Payer: Self-pay | Admitting: *Deleted

## 2019-10-26 NOTE — Progress Notes (Signed)
Voice mail left for patient with instructions/nasal swab appt.(see letter) as wall as letter mailed to patient. Instructed to call this office if questions.

## 2019-10-30 ENCOUNTER — Telehealth: Payer: Self-pay | Admitting: *Deleted

## 2019-10-30 NOTE — Telephone Encounter (Signed)
Message left on voice mail for home and cell phone. Instructions for 11/13/2019 procedure, as well as instructions letter for procedure and nasal swab mailed to patient's home. Asked to call this office to confirm.

## 2019-10-31 ENCOUNTER — Telehealth: Payer: Self-pay | Admitting: *Deleted

## 2019-10-31 NOTE — Telephone Encounter (Signed)
Spoke to patient on the phone and reviewed all instructions/nasal swab (see Letter) with patient. Verbalized understanding.

## 2019-11-09 ENCOUNTER — Other Ambulatory Visit (HOSPITAL_COMMUNITY)
Admission: RE | Admit: 2019-11-09 | Discharge: 2019-11-09 | Disposition: A | Discharge status: Home / Self Care | Payer: Medicare Other | Source: Ambulatory Visit | Attending: Vascular Surgery | Admitting: Vascular Surgery

## 2019-11-09 DIAGNOSIS — Z01812 Encounter for preprocedural laboratory examination: Secondary | ICD-10-CM | POA: Diagnosis not present

## 2019-11-09 DIAGNOSIS — Z20822 Contact with and (suspected) exposure to covid-19: Secondary | ICD-10-CM | POA: Insufficient documentation

## 2019-11-11 LAB — NOVEL CORONAVIRUS, NAA (HOSP ORDER, SEND-OUT TO REF LAB; TAT 18-24 HRS): SARS-CoV-2, NAA: NOT DETECTED

## 2019-11-13 ENCOUNTER — Encounter (HOSPITAL_COMMUNITY)
Admission: RE | Disposition: A | Discharge status: Home / Self Care | Payer: Self-pay | Source: Home / Self Care | Attending: Vascular Surgery

## 2019-11-13 ENCOUNTER — Other Ambulatory Visit: Payer: Self-pay

## 2019-11-13 ENCOUNTER — Ambulatory Visit (HOSPITAL_COMMUNITY)
Admission: RE | Admit: 2019-11-13 | Discharge: 2019-11-13 | Disposition: A | Discharge status: Home / Self Care | Payer: Medicare Other | Attending: Vascular Surgery | Admitting: Vascular Surgery

## 2019-11-13 DIAGNOSIS — Z86718 Personal history of other venous thrombosis and embolism: Secondary | ICD-10-CM | POA: Diagnosis not present

## 2019-11-13 DIAGNOSIS — I509 Heart failure, unspecified: Secondary | ICD-10-CM | POA: Insufficient documentation

## 2019-11-13 DIAGNOSIS — I110 Hypertensive heart disease with heart failure: Secondary | ICD-10-CM | POA: Insufficient documentation

## 2019-11-13 DIAGNOSIS — F329 Major depressive disorder, single episode, unspecified: Secondary | ICD-10-CM | POA: Insufficient documentation

## 2019-11-13 DIAGNOSIS — I70223 Atherosclerosis of native arteries of extremities with rest pain, bilateral legs: Secondary | ICD-10-CM | POA: Diagnosis not present

## 2019-11-13 DIAGNOSIS — F17210 Nicotine dependence, cigarettes, uncomplicated: Secondary | ICD-10-CM | POA: Insufficient documentation

## 2019-11-13 DIAGNOSIS — Z880 Allergy status to penicillin: Secondary | ICD-10-CM | POA: Diagnosis not present

## 2019-11-13 DIAGNOSIS — Z79899 Other long term (current) drug therapy: Secondary | ICD-10-CM | POA: Insufficient documentation

## 2019-11-13 DIAGNOSIS — K219 Gastro-esophageal reflux disease without esophagitis: Secondary | ICD-10-CM | POA: Insufficient documentation

## 2019-11-13 DIAGNOSIS — I70222 Atherosclerosis of native arteries of extremities with rest pain, left leg: Secondary | ICD-10-CM | POA: Diagnosis not present

## 2019-11-13 DIAGNOSIS — E7800 Pure hypercholesterolemia, unspecified: Secondary | ICD-10-CM | POA: Insufficient documentation

## 2019-11-13 HISTORY — PX: ABDOMINAL AORTOGRAM W/LOWER EXTREMITY: CATH118223

## 2019-11-13 LAB — POCT I-STAT, CHEM 8
BUN: 15 mg/dL (ref 8–23)
Calcium, Ion: 0.96 mmol/L — ABNORMAL LOW (ref 1.15–1.40)
Chloride: 100 mmol/L (ref 98–111)
Creatinine, Ser: 0.8 mg/dL (ref 0.44–1.00)
Glucose, Bld: 123 mg/dL — ABNORMAL HIGH (ref 70–99)
HCT: 33 % — ABNORMAL LOW (ref 36.0–46.0)
Hemoglobin: 11.2 g/dL — ABNORMAL LOW (ref 12.0–15.0)
Potassium: 3.6 mmol/L (ref 3.5–5.1)
Sodium: 136 mmol/L (ref 135–145)
TCO2: 31 mmol/L (ref 22–32)

## 2019-11-13 SURGERY — ABDOMINAL AORTOGRAM W/LOWER EXTREMITY
Anesthesia: LOCAL | Laterality: Bilateral

## 2019-11-13 MED ORDER — SODIUM CHLORIDE 0.9 % WEIGHT BASED INFUSION: 1.0000 mL/kg/h | INTRAVENOUS | Status: DC

## 2019-11-13 MED ORDER — LIDOCAINE HCL (PF) 1 % IJ SOLN
INTRAMUSCULAR | Status: DC | PRN
  Administered 2019-11-13: 20 mL via INTRADERMAL

## 2019-11-13 MED ORDER — MORPHINE SULFATE (PF) 2 MG/ML IV SOLN: 2.0000 mg | INTRAVENOUS | Status: DC | PRN

## 2019-11-13 MED ORDER — MIDAZOLAM HCL 2 MG/2ML IJ SOLN
INTRAMUSCULAR | Status: DC | PRN
  Administered 2019-11-13: 1 mg via INTRAVENOUS

## 2019-11-13 MED ORDER — HEPARIN (PORCINE) IN NACL 1000-0.9 UT/500ML-% IV SOLN
INTRAVENOUS | Status: DC | PRN
  Administered 2019-11-13 (×2): 500 mL

## 2019-11-13 MED ORDER — FENTANYL CITRATE (PF) 100 MCG/2ML IJ SOLN
INTRAMUSCULAR | Status: DC | PRN
  Administered 2019-11-13: 50 ug via INTRAVENOUS

## 2019-11-13 MED ORDER — ONDANSETRON HCL 4 MG/2ML IJ SOLN: 4.0000 mg | Freq: Four times a day (QID) | INTRAMUSCULAR | Status: DC | PRN

## 2019-11-13 MED ORDER — LIDOCAINE HCL (PF) 1 % IJ SOLN
INTRAMUSCULAR | Status: AC
  Filled 2019-11-13: qty 30

## 2019-11-13 MED ORDER — HYDRALAZINE HCL 20 MG/ML IJ SOLN: 5.0000 mg | INTRAMUSCULAR | Status: DC | PRN

## 2019-11-13 MED ORDER — IODIXANOL 320 MG/ML IV SOLN
INTRAVENOUS | Status: DC | PRN
  Administered 2019-11-13: 130 mL via INTRA_ARTERIAL

## 2019-11-13 MED ORDER — FENTANYL CITRATE (PF) 100 MCG/2ML IJ SOLN
INTRAMUSCULAR | Status: AC
  Filled 2019-11-13: qty 2

## 2019-11-13 MED ORDER — ACETAMINOPHEN 325 MG PO TABS: 650.0000 mg | ORAL_TABLET | ORAL | Status: DC | PRN

## 2019-11-13 MED ORDER — MIDAZOLAM HCL 2 MG/2ML IJ SOLN
INTRAMUSCULAR | Status: AC
  Filled 2019-11-13: qty 2

## 2019-11-13 MED ORDER — HEPARIN (PORCINE) IN NACL 1000-0.9 UT/500ML-% IV SOLN
INTRAVENOUS | Status: AC
  Filled 2019-11-13: qty 1000

## 2019-11-13 MED ORDER — SODIUM CHLORIDE 0.9 % IV SOLN: 250.0000 mL | INTRAVENOUS | Status: DC | PRN

## 2019-11-13 MED ORDER — OXYCODONE HCL 5 MG PO TABS: 5.0000 mg | ORAL_TABLET | ORAL | Status: DC | PRN

## 2019-11-13 MED ORDER — SODIUM CHLORIDE 0.9% FLUSH: 3.0000 mL | Freq: Two times a day (BID) | INTRAVENOUS | Status: DC

## 2019-11-13 MED ORDER — SODIUM CHLORIDE 0.9 % IV SOLN: INTRAVENOUS | Status: DC

## 2019-11-13 MED ORDER — LABETALOL HCL 5 MG/ML IV SOLN: 10.0000 mg | INTRAVENOUS | Status: DC | PRN

## 2019-11-13 MED ORDER — SODIUM CHLORIDE 0.9% FLUSH: 3.0000 mL | INTRAVENOUS | Status: DC | PRN

## 2019-11-13 SURGICAL SUPPLY — 13 items
CATH ANGIO 5F BER2 65CM (CATHETERS) ×1 IMPLANT
CATH OMNI FLUSH 5F 65CM (CATHETERS) ×2 IMPLANT
CATH SOFT-VU 4F 65 STRAIGHT (CATHETERS) IMPLANT
CATH SOFT-VU STRAIGHT 4F 65CM (CATHETERS) ×2
KIT MICROPUNCTURE NIT STIFF (SHEATH) ×1 IMPLANT
KIT PV (KITS) ×2 IMPLANT
SHEATH PINNACLE 5F 10CM (SHEATH) ×1 IMPLANT
SHEATH PROBE COVER 6X72 (BAG) ×1 IMPLANT
SYR MEDRAD MARK V 150ML (SYRINGE) ×1 IMPLANT
TRANSDUCER W/STOPCOCK (MISCELLANEOUS) ×2 IMPLANT
TRAY PV CATH (CUSTOM PROCEDURE TRAY) ×2 IMPLANT
WIRE BENTSON .035X145CM (WIRE) ×1 IMPLANT
WIRE ROSEN-J .035X180CM (WIRE) ×1 IMPLANT

## 2019-11-13 NOTE — Progress Notes (Signed)
Site area: left groin fa sheath  Site Prior to Removal:  Level 0 Pressure Applied For: 20 minutes Manual:   yes Patient Status During Pull:  stable Post Pull Site:  Level 0 Post Pull Instructions Given:  yes Post Pull Pulses Present: left pt dopplered Dressing Applied:  Gauze and tegaderm Bedrest begins @ 7034 Comments:

## 2019-11-13 NOTE — Progress Notes (Signed)
   I discussed with patient and husband findings of angiogram today. Options include watchful waiting vs aortobifemoral bypass with at least left common femoral endarterectomy.  Patient would need cardiac evaluation she has previously seen Dr. Bronson Ing in [place].  At this time patient does not want to pursue surgery.  I will have her follow-up in my office in 3 to 4 months with repeat ABIs.  I discussed the importance of protecting her feet.  She will continue on aspirin.  Cedar Ditullio C. Donzetta Matters, MD [facility] Office: [phone] Pager: [phone]

## 2019-11-13 NOTE — Discharge Instructions (Signed)
Femoral Site Care This sheet gives you information about how to care for yourself after your procedure. Your health care provider may also give you more specific instructions. If you have problems or questions, contact your health care provider. What can I expect after the procedure? After the procedure, it is common to have:  Bruising that usually fades within 1-2 weeks.  Tenderness at the site. Follow these instructions at home: Wound care  Follow instructions from your health care provider about how to take care of your insertion site. Make sure you: ? Wash your hands with soap and water before you change your bandage (dressing). If soap and water are not available, use hand sanitizer. ? Change your dressing as told by your health care provider. ? Leave stitches (sutures), skin glue, or adhesive strips in place. These skin closures may need to stay in place for 2 weeks or longer. If adhesive strip edges start to loosen and curl up, you may trim the loose edges. Do not remove adhesive strips completely unless your health care provider tells you to do that.  Do not take baths, swim, or use a hot tub until your health care provider approves.  You may shower 24-48 hours after the procedure or as told by your health care provider. ? Gently wash the site with plain soap and water. ? Pat the area dry with a clean towel. ? Do not rub the site. This may cause bleeding.  Do not apply powder or lotion to the site. Keep the site clean and dry.  Check your femoral site every day for signs of infection. Check for: ? Redness, swelling, or pain. ? Fluid or blood. ? Warmth. ? Pus or a bad smell. Activity  For the first 2-3 days after your procedure, or as long as directed: ? Avoid climbing stairs as much as possible. ? Do not squat.  Do not lift anything that is heavier than 10 lb (4.5 kg), or the limit that you are told, until your health care provider says that it is safe.  Rest as  directed. ? Avoid sitting for a long time without moving. Get up to take short walks every 1-2 hours.  Do not drive for 24 hours if you were given a medicine to help you relax (sedative). General instructions  Take over-the-counter and prescription medicines only as told by your health care provider.  Keep all follow-up visits as told by your health care provider. This is important. Contact a health care provider if you have:  A fever or chills.  You have redness, swelling, or pain around your insertion site. Get help right away if:  The catheter insertion area swells very fast.  You pass out.  You suddenly start to sweat or your skin gets clammy.  The catheter insertion area is bleeding, and the bleeding does not stop when you hold steady pressure on the area.  The area near or just beyond the catheter insertion site becomes pale, cool, tingly, or numb. These symptoms may represent a serious problem that is an emergency. Do not wait to see if the symptoms will go away. Get medical help right away. Call your local emergency services (911 in the [place] Do not drive yourself to the hospital. Summary  After the procedure, it is common to have bruising that usually fades within 1-2 weeks.  Check your femoral site every day for signs of infection.  Do not lift anything that is heavier than 10 lb (4.5 kg), or the   limit that you are told, until your health care provider says that it is safe. This information is not intended to replace advice given to you by your health care provider. Make sure you discuss any questions you have with your health care provider. Document Revised: 11/01/2017 Document Reviewed: 11/01/2017 Elsevier Patient Education  2020 Elsevier Inc.  

## 2019-11-13 NOTE — H&P (Signed)
Tonya Hughes is a 69 y.o. female with history of bilateral lower extremity right greater than left pain. She has a history of congestive heart failure. Recent underwent ABIs were decreased bilaterally. She does not take any antiplatelet agents. She has never had allergy to any statins does not take that either. Previously was on Lovenox for right lower extremity DVT. States that she has pain with walking this is somewhat from bunions on the foot but the right foot also hurts just with standing particularly in the calf muscle. States that she has foot pain at rest as well. Does have some back issues that caused some of her posterior thigh pain as well. No tissue loss or ulceration at this time. She continues to smoke daily.       Past Medical History:  Diagnosis Date  . Back pain   . Congestive heart failure (CHF) ([place])   . Depressive disorder   . DVT (deep venous thrombosis) ([place])    Right LE s/p back surgery stop date lovenox 09/05/17  . GERD (gastroesophageal reflux disease)   . Hypercholesteremia   . Hypertension    History reviewed. No pertinent family history.       Past Surgical History:  Procedure Laterality Date  . ABDOMINAL HYSTERECTOMY    . CERVICAL SPINE SURGERY    . CHOLECYSTECTOMY    . LUMBAR SPINE SURGERY     Short Social History:  Social History        Tobacco Use  . Smoking status: Current Every Day Smoker    Packs/day: 0.80    Years: 40.00    Pack years: 32.00    Types: Cigarettes  . Smokeless tobacco: Never Used  Substance Use Topics  . Alcohol use: Not Currently       Allergies  Allergen Reactions  . Zanaflex [Tizanidine Hcl]   . Penicillins Itching and Rash         Current Outpatient Medications  Medication Sig Dispense Refill  . carvedilol (COREG) 3.125 MG tablet TAKE ONE TABLET BY MOUTH TWICE DAILY 4 tablet 0  . fexofenadine (ALLEGRA) 180 MG tablet Take 180 mg by mouth daily.    . furosemide (LASIX) 40 MG tablet Take 40 mg by mouth  daily.    Marland Kitchen lisinopril (PRINIVIL,ZESTRIL) 5 MG tablet Take 5 mg by mouth daily.    Marland Kitchen PARoxetine (PAXIL) 20 MG tablet Take 20 mg by mouth daily.    . potassium chloride SA (K-DUR,KLOR-CON) 20 MEQ tablet TAKE ONE (1) TABLET EACH DAY 7 tablet 0   No current facility-administered medications for this visit.    Review of Systems  Constitutional: Constitutional negative.  HENT: HENT negative.  Eyes: Eyes negative.  Respiratory: Respiratory negative.  Cardiovascular: Cardiovascular negative.  Musculoskeletal: Positive for back pain and leg pain.  Skin: Skin negative.  Hematologic: Hematologic/lymphatic negative.  Psychiatric: Psychiatric negative.   Vitals:   11/13/19 0656  BP: (!) 174/58  Pulse: 67  Resp: 16  Temp: 98.3 F (36.8 C)  SpO2: 97%    Constitutional:  Appearance: She is obese.  HENT:  Head: Normocephalic.  Nose: Nose normal.  Mouth/Throat:  Mouth: Mucous membranes are moist.  Eyes:  Pupils: Pupils are equal, round, and reactive to light.  Neck:  Musculoskeletal: Normal range of motion.  Vascular: No carotid bruit.  Cardiovascular:  Pulses:  Radial pulses are 2+ on the right side and 2+ on the left side.  Femoral pulses are 1+ on the right side and  1+ on the left side.  Popliteal pulses are 0 on the right side and 0 on the left side.  Abdominal:  General: Abdomen is flat.  Palpations: Abdomen is soft.  Skin:  General: Skin is warm and dry.  Capillary Refill: Capillary refill takes 2 to 3 seconds.  Neurological:  General: No focal deficit present.  Mental Status: She is alert.  Psychiatric:  Mood and Affect: Mood normal.  Behavior: Behavior normal.  Thought Content: Thought content normal.  Judgment: Judgment normal.   Data:  ABIs right 0.57 left 0.67    Assessment/Plan:   69 year old female with right greater than left lower extremity pain moderately depressed ABIs bilaterally. She continues to smoke. She does have decreased capillary refill  particularly on the right does not have wounds at this time. I have offered watchful waiting versus aortogram possible invention on the right from a left common femoral approach. This time she wants to proceed with this. I have discussed smoking cessation. She will also start baby aspirin. If we intervene she will likely need Plavix at least for a short time. She should also be placed on a statin drug given that she has no allergy. We will get her set up for aortogram possible invention on the right after the new year.    Bama Hanselman C. Donzetta Matters, MD [facility] Office: [phone] Pager: [phone]

## 2019-11-13 NOTE — Op Note (Signed)
    Patient name: Tonya Hughes MRN: [id number] DOB: 04/01/1951 Sex: female  11/13/2019 Pre-operative Diagnosis: bilateral lower extremity rest pain Post-operative diagnosis:  Same Surgeon:  Erlene Quan C. Donzetta Matters, MD Procedure Performed: 1.  US guided cannulation of left common femoral artery 2.  Aortogram with bilateral lower extremity angiography 3.  Catheter pullback pressure gradient aorta in the left common iliac artery 4.  Moderate sedation with fentanyl and Versed for 20mnutes  Indications: 69year old female with history of bilateral lower extremity pain.  She has moderately depressed ABIs.  She does not have easily palpable common femoral pulses.  She is indicated for angiography possible intervention.  Findings: Ultrasound evaluation left common femoral artery was heavily diseased.  It was difficult to pass a wire.  There is heavy disease of bilateral common iliac arteries in the distal aorta.  There is a pullback gradient of 40 mmHg systolic across the aortic and iliac lesion on the left.  The left SFA is occluded reconstitutes initially 3 vessels in the posterior tibial was then occluded has runoff via the anterior tibial and peroneal on the left.  On the right side the SFA is patent throughout its course with three-vessel runoff to the foot.  Patient will need consideration of aortobifemoral bypass.   Procedure:  The patient was identified in the holding area and taken to room 8.  The patient was then placed supine on the table and prepped and draped in the usual sterile fashion.  A time out was called.  Ultrasound was used to evaluate the left common femoral artery which was heavily diseased.  The area was anesthetized with 1% lidocaine.  Moderate sedation was administered with fentanyl and Versed and her vital signs were monitored throughout the case.  Ultrasound was used to directly identify and cannulate the common femoral artery.  Micropuncture needle followed the wire and the sheath  was placed.  Bentson wire was placed and would not pass initially.  We placed a 5 French sheath under fluoroscopic guidance.  We then used very catheter to traverse up into the descending thoracic aorta.  Omni catheter was placed.  We performed angiography followed by magnified views of the distal aorta.  We then performed bilateral lower extremity runoff with the above findings.  We exchanged for a straight catheter perform pullback gradient.  We elected that patient would need surgical revascularization as there did not appear to be any endovascular options.  Sheath will be pulled in postoperative holding.  She tolerated procedure without any complication.   Contrast: 130cc   Rochester Serpe C. CDonzetta Matters MD [facility]: [phone]: [phone]

## 2020-01-10 DIAGNOSIS — I10 Essential (primary) hypertension: Secondary | ICD-10-CM | POA: Diagnosis not present

## 2020-01-10 DIAGNOSIS — Z299 Encounter for prophylactic measures, unspecified: Secondary | ICD-10-CM | POA: Diagnosis not present

## 2020-01-10 DIAGNOSIS — I509 Heart failure, unspecified: Secondary | ICD-10-CM | POA: Diagnosis not present

## 2020-01-10 DIAGNOSIS — K122 Cellulitis and abscess of mouth: Secondary | ICD-10-CM | POA: Diagnosis not present

## 2020-01-10 DIAGNOSIS — Z789 Other specified health status: Secondary | ICD-10-CM | POA: Diagnosis not present

## 2020-02-21 ENCOUNTER — Other Ambulatory Visit: Payer: Self-pay | Admitting: *Deleted

## 2020-02-21 DIAGNOSIS — I739 Peripheral vascular disease, unspecified: Secondary | ICD-10-CM

## 2020-02-26 ENCOUNTER — Other Ambulatory Visit: Payer: Self-pay

## 2020-02-26 ENCOUNTER — Ambulatory Visit (HOSPITAL_COMMUNITY)
Admission: RE | Admit: 2020-02-26 | Discharge: 2020-02-26 | Disposition: A | Discharge status: Home / Self Care | Payer: Medicare Other | Source: Ambulatory Visit | Attending: Vascular Surgery | Admitting: Vascular Surgery

## 2020-02-26 DIAGNOSIS — I739 Peripheral vascular disease, unspecified: Secondary | ICD-10-CM | POA: Diagnosis not present

## 2020-03-01 ENCOUNTER — Other Ambulatory Visit: Payer: Self-pay

## 2020-03-01 ENCOUNTER — Ambulatory Visit (INDEPENDENT_AMBULATORY_CARE_PROVIDER_SITE_OTHER): Payer: Medicare Other | Admitting: Vascular Surgery

## 2020-03-01 ENCOUNTER — Encounter: Payer: Self-pay | Admitting: Vascular Surgery

## 2020-03-01 VITALS — Ht 63.0 in | Wt 170.0 lb

## 2020-03-01 DIAGNOSIS — I739 Peripheral vascular disease, unspecified: Secondary | ICD-10-CM

## 2020-03-01 NOTE — Progress Notes (Signed)
Virtual Visit via Telephone Note   I connected with [name] on 03/01/2020 using the [email or web address] by telephone and verified that I was speaking with the correct person using two identifiers.    The limitations of evaluation and management by telemedicine and the availability of in person appointments have been previously discussed with the patient and are documented in the patients chart. The patient expressed understanding and consented to proceed.   Chief Complaint: Bilateral lower extremity rest pain right greater than left   History of Present Illness: Tonya Hughes is a 69 y.o. female with with history of bilateral lower extremity rest pain.  She is undergoing angiography this demonstrated aorto iliac occlusive disease.  She continues to have right rest pain.  She recently underwent ABIs.  She does not have a tissue loss or ulceration.  She continues to walk but is limited by her leg pain as well.  She also has concomitant back pain with history of back surgery.  She currently does not have any chest pain but has seen Dr. Bronson Ing in the past  Past Medical History:  Diagnosis Date  . Back pain   . Congestive heart failure (CHF) ([place])   . Depressive disorder   . DVT (deep venous thrombosis) ([place])    Right LE s/p back surgery stop date lovenox 09/05/17  . GERD (gastroesophageal reflux disease)   . Hypercholesteremia   . Hypertension     Past Surgical History:  Procedure Laterality Date  . ABDOMINAL AORTOGRAM W/LOWER EXTREMITY Bilateral 11/13/2019   Procedure: ABDOMINAL AORTOGRAM W/LOWER EXTREMITY;  Surgeon: Waynetta Sandy, MD;  Location: [facility];  Service: Cardiovascular;  Laterality: Bilateral;  . ABDOMINAL HYSTERECTOMY    . CERVICAL SPINE SURGERY    . CHOLECYSTECTOMY    . LUMBAR SPINE SURGERY      Current Meds  Medication Sig  . aspirin EC 81 MG tablet Take 81 mg by mouth daily.  . carvedilol (COREG) 3.125 MG tablet TAKE ONE TABLET BY MOUTH  TWICE DAILY (Patient taking differently: Take 3.125 mg by mouth 2 (two) times daily. )  . fexofenadine (ALLEGRA) 180 MG tablet Take 180 mg by mouth daily.  . furosemide (LASIX) 40 MG tablet Take 40 mg by mouth daily.  Marland Kitchen lisinopril (PRINIVIL,ZESTRIL) 5 MG tablet Take 5 mg by mouth daily.  Marland Kitchen OVER THE COUNTER MEDICATION Apply 1 application topically at bedtime. Hempvana cream, apply to right foot  . PARoxetine (PAXIL) 20 MG tablet Take 20 mg by mouth daily.  . potassium chloride SA (K-DUR,KLOR-CON) 20 MEQ tablet TAKE ONE (1) TABLET EACH DAY (Patient taking differently: Take 20 mEq by mouth daily. )  . Pseudoephedrine-Ibuprofen (ADVIL COLD & SINUS LIQUI-GELS PO) Take 2 tablets by mouth daily as needed (sinus headaches).  . traMADol (ULTRAM) 50 MG tablet Take 50 mg by mouth every 6 (six) hours as needed for moderate pain.    12 system ROS was negative unless otherwise noted in HPI   Observations/Objective: Awake alert and oriented and demonstrates good understanding of our conversation  ABIs reviewed 0.62 monophasic on the right and 0.57 monophasic on the left.  Toe pressure on the right 51 and left 38  Assessment and Plan: 70 year old female with aortoiliac occlusive disease and bilateral lower extremity rest pain with moderately depressed ABIs that are monophasic.  I discussed with her proceeding with aortobifemoral bypass.  Given the timing of her most recent angiogram I have recommended CT angio abdomen and pelvis with  bilateral lower extremity runoff.  We will also get cardiac clearance for possible surgery and she has seen Dr. Bronson Ing in the past.  After these two are completed we will have her follow-up in person to discuss surgical intervention.    I discussed the assessment and treatment plan with the patient. The patient was provided an opportunity to ask questions and all were answered. The patient agreed with the plan and demonstrated an understanding of the instructions.   The  patient was advised to call back or seek an in-person evaluation if the symptoms worsen or if the condition fails to improve as anticipated.  I spent 11 minutes with the patient via telephone encounter and in chart review today.   Signed, Servando Snare [facility] Office: [phone]  03/01/2020, 3:17 PM

## 2020-03-05 ENCOUNTER — Other Ambulatory Visit: Payer: Self-pay

## 2020-03-05 DIAGNOSIS — I739 Peripheral vascular disease, unspecified: Secondary | ICD-10-CM

## 2020-03-21 ENCOUNTER — Other Ambulatory Visit: Payer: Self-pay

## 2020-03-21 ENCOUNTER — Ambulatory Visit
Admission: RE | Admit: 2020-03-21 | Discharge: 2020-03-21 | Disposition: A | Discharge status: Home / Self Care | Payer: Medicare Other | Source: Ambulatory Visit | Attending: Vascular Surgery | Admitting: Vascular Surgery

## 2020-03-21 DIAGNOSIS — I70201 Unspecified atherosclerosis of native arteries of extremities, right leg: Secondary | ICD-10-CM | POA: Diagnosis not present

## 2020-03-21 DIAGNOSIS — I739 Peripheral vascular disease, unspecified: Secondary | ICD-10-CM

## 2020-03-21 MED ORDER — IOPAMIDOL (ISOVUE-370) INJECTION 76%
90.0000 mL | Freq: Once | INTRAVENOUS | Status: AC | PRN
  Administered 2020-03-21: 90 mL via INTRAVENOUS

## 2020-03-25 ENCOUNTER — Other Ambulatory Visit: Payer: Self-pay

## 2020-03-25 ENCOUNTER — Telehealth: Payer: Self-pay

## 2020-03-25 ENCOUNTER — Ambulatory Visit: Payer: Medicare Other | Admitting: Cardiovascular Disease

## 2020-03-25 ENCOUNTER — Encounter: Payer: Self-pay | Admitting: Cardiovascular Disease

## 2020-03-25 ENCOUNTER — Encounter: Payer: Self-pay | Admitting: *Deleted

## 2020-03-25 VITALS — BP 148/70 | HR 62 | Ht 63.0 in | Wt 178.0 lb

## 2020-03-25 DIAGNOSIS — I5042 Chronic combined systolic (congestive) and diastolic (congestive) heart failure: Secondary | ICD-10-CM

## 2020-03-25 DIAGNOSIS — R0600 Dyspnea, unspecified: Secondary | ICD-10-CM | POA: Diagnosis not present

## 2020-03-25 DIAGNOSIS — Z720 Tobacco use: Secondary | ICD-10-CM | POA: Diagnosis not present

## 2020-03-25 DIAGNOSIS — Z01818 Encounter for other preprocedural examination: Secondary | ICD-10-CM

## 2020-03-25 DIAGNOSIS — I739 Peripheral vascular disease, unspecified: Secondary | ICD-10-CM | POA: Diagnosis not present

## 2020-03-25 DIAGNOSIS — R0609 Other forms of dyspnea: Secondary | ICD-10-CM

## 2020-03-25 MED ORDER — ATORVASTATIN CALCIUM 40 MG PO TABS: 40.0000 mg | ORAL_TABLET | Freq: Every day | ORAL | 6 refills | Status: DC

## 2020-03-25 NOTE — Progress Notes (Addendum)
SUBJECTIVE: The patient presents for overdue follow-up.  I saw her once before in February 2019.  She has aortoiliac occlusive disease and is being scheduled to go aortobifemoral bypass by Dr. Donzetta Matters.  Past medical history includes chronic combined heart failure and tobacco abuse.  Her most recent echocardiogram performed on 11/19/2017 demonstrated mildly reduced left ventricular systolic function, LVEF 70%, mild concentric LVH, grade 2 diastolic dysfunction, mild left atrial dilatation, and mild mitral and pulmonic regurgitation.  Pulmonary pressures were elevated at 43 mmHg.  CVP was 15 mmHg.  ECG performed on 11/13/2019 which I personally reviewed demonstrates sinus rhythm with nonspecific T wave abnormalities.  She underwent a low risk nuclear stress test on 01/12/2018.  She does not get much activity around the house due to bilateral leg pain and weakness.  She denies exertional chest pain.  She walks with a cane.  She says she seldom gets short of breath.  She has been smoking a pack of cigarettes daily since the age of 61.  She does laundry but is unable to climb stairs.  She denies palpitations, orthopnea, leg swelling, and paroxysmal nocturnal dyspnea.  She takes Lasix 40 mg daily.    Review of Systems: As per "subjective", otherwise negative.  Allergies  Allergen Reactions  . Zanaflex [Tizanidine Hcl] Hives and Swelling  . Penicillins Itching and Rash    Did it involve swelling of the face/tongue/throat, SOB, or low BP? No Did it involve sudden or severe rash/hives, skin peeling, or any reaction on the inside of your mouth or nose? Yes Did you need to seek medical attention at a hospital or doctor's office? Yes When did it last happen?40 + years If all above answers are "NO", may proceed with cephalosporin use.     Current Outpatient Medications  Medication Sig Dispense Refill  . aspirin EC 81 MG tablet Take 81 mg by mouth daily.    . carvedilol (COREG)  3.125 MG tablet TAKE ONE TABLET BY MOUTH TWICE DAILY (Patient taking differently: Take 3.125 mg by mouth 2 (two) times daily. ) 4 tablet 0  . fexofenadine (ALLEGRA) 180 MG tablet Take 180 mg by mouth daily.    . furosemide (LASIX) 40 MG tablet Take 40 mg by mouth daily.    Marland Kitchen lisinopril (PRINIVIL,ZESTRIL) 5 MG tablet Take 5 mg by mouth daily.    Marland Kitchen PARoxetine (PAXIL) 20 MG tablet Take 20 mg by mouth daily.    . potassium chloride SA (K-DUR,KLOR-CON) 20 MEQ tablet TAKE ONE (1) TABLET EACH DAY (Patient taking differently: Take 20 mEq by mouth daily. ) 7 tablet 0  . Pseudoephedrine-Ibuprofen (ADVIL COLD & SINUS LIQUI-GELS PO) Take 2 tablets by mouth daily as needed (sinus headaches).    Marland Kitchen atorvastatin (LIPITOR) 40 MG tablet Take 1 tablet (40 mg total) by mouth daily. 30 tablet 6   No current facility-administered medications for this visit.    Past Medical History:  Diagnosis Date  . Back pain   . Congestive heart failure (CHF) ([place])   . Depressive disorder   . DVT (deep venous thrombosis) ([place])    Right LE s/p back surgery stop date lovenox 09/05/17  . GERD (gastroesophageal reflux disease)   . Hypercholesteremia   . Hypertension     Past Surgical History:  Procedure Laterality Date  . ABDOMINAL AORTOGRAM W/LOWER EXTREMITY Bilateral 11/13/2019   Procedure: ABDOMINAL AORTOGRAM W/LOWER EXTREMITY;  Surgeon: Waynetta Sandy, MD;  Location: [facility];  Service: Cardiovascular;  Laterality: Bilateral;  . ABDOMINAL HYSTERECTOMY    . CERVICAL SPINE SURGERY    . CHOLECYSTECTOMY    . LUMBAR SPINE SURGERY      Social History   Socioeconomic History  . Marital status: Married    Spouse name: Not on file  . Number of children: Not on file  . Years of education: Not on file  . Highest education level: Not on file  Occupational History  . Not on file  Tobacco Use  . Smoking status: Current Every Day Smoker    Packs/day: 0.80    Years: 40.00    Pack years: 32.00     Types: Cigarettes  . Smokeless tobacco: Never Used  Substance and Sexual Activity  . Alcohol use: Not Currently  . Drug use: Never  . Sexual activity: Not on file  Other Topics Concern  . Not on file  Social History Narrative  . Not on file   Social Determinants of Health   Financial Resource Strain:   . Difficulty of Paying Living Expenses:   Food Insecurity:   . Worried About Charity fundraiser in the Last Year:   . Arboriculturist in the Last Year:   Transportation Needs:   . Film/video editor (Medical):   Marland Kitchen Lack of Transportation (Non-Medical):   Physical Activity:   . Days of Exercise per Week:   . Minutes of Exercise per Session:   Stress:   . Feeling of Stress :   Social Connections:   . Frequency of Communication with Friends and Family:   . Frequency of Social Gatherings with Friends and Family:   . Attends Religious Services:   . Active Member of Clubs or Organizations:   . Attends Archivist Meetings:   Marland Kitchen Marital Status:   Intimate Partner Violence:   . Fear of Current or Ex-Partner:   . Emotionally Abused:   Marland Kitchen Physically Abused:   . Sexually Abused:     Orson Slick, LPN was present throughout the entirety of the encounter.  Vitals:   03/25/20 0812  BP: (!) 148/70  Pulse: 62  Weight: 178 lb (80.7 kg)  Height: 5' 3"  (1.6 m)    Wt Readings from Last 3 Encounters:  03/25/20 178 lb (80.7 kg)  03/01/20 170 lb (77.1 kg)  11/13/19 172 lb (78 kg)     PHYSICAL EXAM General: NAD HEENT: Normal. Neck: No JVD, no thyromegaly. Lungs: Diffusely diminished breath sounds with poor air movement without crackles or wheezes. CV: Regular rate and rhythm, normal S1/S2, no S3/S4, no murmur. No pretibial or periankle edema.   Abdomen: Soft, nontender, no distention.  Neurologic: Alert and oriented.  Psych: Normal affect. Skin: Normal. Musculoskeletal: No gross deformities.      Labs: Lab Results  Component Value Date/Time   K 3.6  11/13/2019 08:03 AM   BUN 15 11/13/2019 08:03 AM   CREATININE 0.80 11/13/2019 08:03 AM   HGB 11.2 (L) 11/13/2019 08:03 AM     Lipids: No results found for: LDLCALC, LDLDIRECT, CHOL, TRIG, HDL     ASSESSMENT AND PLAN:  1.  Preoperative risk stratification: I am unable to ascertain exercise tolerance.  She has chronic combined heart failure and a long history of tobacco abuse.  She has episodic shortness of breath.  I will obtain a Lexiscan Myoview to more accurately assess risk.  I will also obtain an echocardiogram to evaluate cardiac structure and function.  2.  Chronic combined heart failure: LVEF  45% with grade 2 diastolic dysfunction by echocardiogram in 2019.  She appears euvolemic with NYHA class II symptoms.  Currently on carvedilol, lisinopril, and Lasix 40 mg daily.  I will obtain a follow-up echocardiogram to assess cardiac structure and function.  3.  Tobacco abuse: She needs cessation.  4.  Peripheral arterial disease: She is being scheduled undergo aortobifemoral bypass surgery.  Currently on aspirin.  She needs statin therapy.  I will start atorvastatin 40 mg.   Disposition: Follow up 2 months virtual visit  A high level of decision making was required for increased medical complexities.   Kate Sable, M.D., F.A.C.C.

## 2020-03-25 NOTE — Patient Instructions (Addendum)
Medication Instructions:   Begin Atorvastatin 12m daily.  Continue all other current medications.  Labwork: none  Testing/Procedures:  Your physician has requested that you have an echocardiogram. Echocardiography is a painless test that uses sound waves to create images of your heart. It provides your doctor with information about the size and shape of your heart and how well your heart's chambers and valves are working. This procedure takes approximately one hour. There are no restrictions for this procedure.  Your physician has requested that you have a lexiscan myoview. For further information please visit [email or web address] Please follow instruction sheet, as given.  Office will contact with results via phone or letter.    Follow-Up: 2 months   Any Other Special Instructions Will Be Listed Below (If Applicable).  If you need a refill on your cardiac medications before your next appointment, please call your pharmacy.

## 2020-03-25 NOTE — Telephone Encounter (Signed)
  Patient Consent for Virtual Visit         Tonya Hughes has provided verbal consent on 03/25/2020 for a virtual visit (video or telephone).   CONSENT FOR VIRTUAL VISIT FOR:  Tonya Hughes  By participating in this virtual visit I agree to the following:  I hereby voluntarily request, consent and authorize [facility] and its employed or contracted physicians, physician assistants, nurse practitioners or other licensed health care professionals (the Practitioner), to provide me with telemedicine health care services (the "Services") as deemed necessary by the treating Practitioner. I acknowledge and consent to receive the Services by the Practitioner via telemedicine. I understand that the telemedicine visit will involve communicating with the Practitioner through live audiovisual communication technology and the disclosure of certain medical information by electronic transmission. I acknowledge that I have been given the opportunity to request an in-person assessment or other available alternative prior to the telemedicine visit and am voluntarily participating in the telemedicine visit.  I understand that I have the right to withhold or withdraw my consent to the use of telemedicine in the course of my care at any time, without affecting my right to future care or treatment, and that the Practitioner or I may terminate the telemedicine visit at any time. I understand that I have the right to inspect all information obtained and/or recorded in the course of the telemedicine visit and may receive copies of available information for a reasonable fee.  I understand that some of the potential risks of receiving the Services via telemedicine include:  Marland Kitchen Delay or interruption in medical evaluation due to technological equipment failure or disruption; . Information transmitted may not be sufficient (e.g. poor resolution of images) to allow for appropriate medical decision making by the Practitioner;  and/or  . In rare instances, security protocols could fail, causing a breach of personal health information.  Furthermore, I acknowledge that it is my responsibility to provide information about my medical history, conditions and care that is complete and accurate to the best of my ability. I acknowledge that Practitioner's advice, recommendations, and/or decision may be based on factors not within their control, such as incomplete or inaccurate data provided by me or distortions of diagnostic images or specimens that may result from electronic transmissions. I understand that the practice of medicine is not an exact science and that Practitioner makes no warranties or guarantees regarding treatment outcomes. I acknowledge that a copy of this consent can be made available to me via my patient portal ([facility]), or I can request a printed copy by calling the office of [facility].    I understand that my insurance will be billed for this visit.   I have read or had this consent read to me. . I understand the contents of this consent, which adequately explains the benefits and risks of the Services being provided via telemedicine.  . I have been provided ample opportunity to ask questions regarding this consent and the Services and have had my questions answered to my satisfaction. . I give my informed consent for the services to be provided through the use of telemedicine in my medical care

## 2020-03-29 ENCOUNTER — Telehealth: Payer: Self-pay | Admitting: Cardiovascular Disease

## 2020-03-29 ENCOUNTER — Ambulatory Visit: Payer: Medicare Other | Admitting: Vascular Surgery

## 2020-03-29 ENCOUNTER — Other Ambulatory Visit: Payer: Self-pay

## 2020-03-29 ENCOUNTER — Encounter: Payer: Self-pay | Admitting: Vascular Surgery

## 2020-03-29 VITALS — BP 158/72 | HR 65 | Temp 97.8°F | Resp 20 | Ht 63.0 in | Wt 176.8 lb

## 2020-03-29 DIAGNOSIS — I739 Peripheral vascular disease, unspecified: Secondary | ICD-10-CM | POA: Diagnosis not present

## 2020-03-29 NOTE — Telephone Encounter (Signed)
° °  [facility] Pre-operative Risk Assessment    HEARTCARE STAFF: - Please ensure there is not already an duplicate clearance open for this procedure. - Under Visit Info/Reason for Call, type in Other and utilize the format Clearance MM/DD/YY or Clearance TBD. Do not use dashes or single digits. - If request is for dental extraction, please clarify the # of teeth to be extracted.  Request for surgical clearance:  1. What type of surgery is being performed? Aorta Bifemoral Bypass   2. When is this surgery scheduled? 04/18/20  3. What type of clearance is required (medical clearance vs. Pharmacy clearance to hold med vs. Both)? Medical   4. Are there any medications that need to be held prior to surgery and how long? TBD by Dr. Bronson Ing  5. Practice name and name of physician performing surgery? [facility], Dr. Servando Snare   6. What is the office phone number? [phone]   7.   What is the office fax number? [phone]  8.   Anesthesia type (None, local, MAC, general) ? Doesn't know    Trilby Drummer 03/29/2020, 1:19 PM  _________________________________________________________________   (provider comments below)

## 2020-03-29 NOTE — Progress Notes (Signed)
Patient ID: KAROLYNA BIANCHINI, female   DOB: 1951-07-21, 68 y.o.   MRN: [id number]  Reason for Consult: Follow-up   Referred by Monico Blitz, MD  Subjective:     HPI:  ELIYAH MCSHEA is a 69 y.o. female history of bilateral lower extremity right greater than left foot and leg pain.  Patient does have known congestive heart failure is followed by Dr. Bronson Ing.  Does have a history of a previous DVT.  She has undergone angiography which demonstrated significant aortic disease and subtotal iliac occlusions with high arterial gradient across the blockages.  Patient still does not have any tissue loss or ulceration.  She does continue to smoke.  She is set to have cardiac evaluation with Dr. Bronson Ing.  Previous abdominal surgeries include laparoscopic cholecystectomy as well as emergent tubal ligation and previous cesarean section.  Past Medical History:  Diagnosis Date  . Back pain   . Congestive heart failure (CHF) ([facility])   . Depressive disorder   . DVT (deep venous thrombosis) ([facility])    Right LE s/p back surgery stop date lovenox 09/05/17  . GERD (gastroesophageal reflux disease)   . Hypercholesteremia   . Hypertension    History reviewed. No pertinent family history. Past Surgical History:  Procedure Laterality Date  . ABDOMINAL AORTOGRAM W/LOWER EXTREMITY Bilateral 11/13/2019   Procedure: ABDOMINAL AORTOGRAM W/LOWER EXTREMITY;  Surgeon: Waynetta Sandy, MD;  Location: [facility];  Service: Cardiovascular;  Laterality: Bilateral;  . ABDOMINAL HYSTERECTOMY    . CERVICAL SPINE SURGERY    . CHOLECYSTECTOMY    . LUMBAR SPINE SURGERY      Short Social History:  Social History   Tobacco Use  . Smoking status: Current Every Day Smoker    Packs/day: 1.00    Years: 40.00    Pack years: 40.00    Types: Cigarettes  . Smokeless tobacco: Never Used  Substance Use Topics  . Alcohol use: Not Currently    Allergies  Allergen Reactions  . Zanaflex [Tizanidine Hcl]  Hives and Swelling  . Penicillins Itching and Rash    Did it involve swelling of the face/tongue/throat, SOB, or low BP? No Did it involve sudden or severe rash/hives, skin peeling, or any reaction on the inside of your mouth or nose? Yes Did you need to seek medical attention at a hospital or doctor's office? Yes When did it last happen?40 + years If all above answers are "NO", may proceed with cephalosporin use.     Current Outpatient Medications  Medication Sig Dispense Refill  . aspirin EC 81 MG tablet Take 81 mg by mouth daily.    Marland Kitchen atorvastatin (LIPITOR) 40 MG tablet Take 1 tablet (40 mg total) by mouth daily. 30 tablet 6  . carvedilol (COREG) 3.125 MG tablet TAKE ONE TABLET BY MOUTH TWICE DAILY (Patient taking differently: Take 3.125 mg by mouth 2 (two) times daily. ) 4 tablet 0  . fexofenadine (ALLEGRA) 180 MG tablet Take 180 mg by mouth daily.    . furosemide (LASIX) 40 MG tablet Take 40 mg by mouth daily.    Marland Kitchen lisinopril (PRINIVIL,ZESTRIL) 5 MG tablet Take 5 mg by mouth daily.    Marland Kitchen PARoxetine (PAXIL) 20 MG tablet Take 20 mg by mouth daily.    . potassium chloride SA (K-DUR,KLOR-CON) 20 MEQ tablet TAKE ONE (1) TABLET EACH DAY (Patient taking differently: Take 20 mEq by mouth daily. ) 7 tablet 0  . Pseudoephedrine-Ibuprofen (ADVIL COLD & SINUS LIQUI-GELS PO) Take  2 tablets by mouth daily as needed (sinus headaches).     No current facility-administered medications for this visit.    Review of Systems  Constitutional:  Constitutional negative. HENT: HENT negative.  Eyes: Eyes negative.  Respiratory: Positive for shortness of breath.  Cardiovascular: Cardiovascular negative.  GI: Gastrointestinal negative.  Musculoskeletal: Positive for leg pain and joint pain.  Skin: Skin negative.  Neurological: Positive for numbness.  Psychiatric: Psychiatric negative.        Objective:  Objective   Vitals:   03/29/20 0836  BP: (!) 158/72  Pulse: 65  Resp: 20  Temp: 97.8  F (36.6 C)  SpO2: 94%  Weight: 176 lb 12.8 oz (80.2 kg)  Height: 5' 3"  (1.6 m)   Body mass index is 31.32 kg/m.  Physical Exam HENT:     Head: Normocephalic.     Nose:     Comments: Mask in place Eyes:     Pupils: Pupils are equal, round, and reactive to light.  Cardiovascular:     Pulses:          Femoral pulses are 0 on the right side and 0 on the left side. Pulmonary:     Effort: Pulmonary effort is normal.  Abdominal:     General: Abdomen is flat.     Palpations: Abdomen is soft. There is no mass.  Musculoskeletal:        General: Swelling present. Normal range of motion.     Cervical back: Normal range of motion and neck supple.  Skin:    General: Skin is warm.     Capillary Refill: Capillary refill takes 2 to 3 seconds.  Neurological:     General: No focal deficit present.     Mental Status: She is alert.  Psychiatric:        Mood and Affect: Mood normal.        Behavior: Behavior normal.        Thought Content: Thought content normal.        Judgment: Judgment normal.     Data: CT Angio IMPRESSION: VASCULAR  1. Calcified plaque and stenosis in the distal abdominal aorta and at the aortic bifurcation. Aortoiliac disease is difficult to evaluate due to the spinal hardware artifact but the aortoiliac inflow disease appears to be patient's primary problem. 2. Mild-to-moderate stenosis involving the origin and proximal left SFA. 3. No significant right outflow disease. 4. Mild left runoff disease. Two vessel runoff in the left lower extremity. Segmental occlusion of the left posterior tibial artery. 5. No significant right runoff disease. 6. Multiple areas of visceral artery stenosis as described. Probable bilateral renal artery stenosis and mild-to-moderate stenosis involving the celiac artery trunk.  We reviewed her CT scan together as well as her previous angiogram demonstrates tight stenosis of her aortic bifurcation with significant stenosis in her  aorta extending up to the renal arteries where there is heavy calcification.     Assessment/Plan:     69 year old female with bilateral lower extremity pain.  Right lower extremity is the worst she does have delayed capillary refill.  She does not have palpable femoral pulses.  I discussed with her that I do believe her pain is multifactorial although given her level of aortoiliac disease this is certainly a component.  The most ideal procedure for her would be aortobifemoral bypass but given her CHF she first needs to undergo cardiac clearance.  If she cannot clear from cardiology standpoint we would consider iliac stents bilaterally realizing  that this would be suboptimal and possibly she would require axillary bifemoral bypass in the future.  She demonstrates good understanding.  She will get her cardiac clearance and we will tentatively plan for aortobifemoral bypass.  I discussed the risk of her heart as well as blood loss as well as renal failure up to and including death.  I would expect at least a week in the hospital with 2 weeks of rehab.  She demonstrates good understanding but unfortunately was not accompanied by her family today.  All of this will be pending cardiac clearance and given that she does not have any tissue loss at this time this is not urgent to proceed.     Waynetta Sandy MD [facility]

## 2020-04-02 NOTE — Telephone Encounter (Signed)
Pending upcoming myoview and echo on 6/11 prior to final clearance. Please let the requesting provider know

## 2020-04-03 NOTE — Telephone Encounter (Signed)
Requesting office made aware.

## 2020-04-12 ENCOUNTER — Other Ambulatory Visit: Payer: Self-pay

## 2020-04-12 ENCOUNTER — Encounter (HOSPITAL_COMMUNITY): Admission: RE | Admit: 2020-04-12 | Payer: Medicare Other | Source: Ambulatory Visit

## 2020-04-12 ENCOUNTER — Ambulatory Visit (HOSPITAL_COMMUNITY)
Admission: RE | Admit: 2020-04-12 | Discharge: 2020-04-12 | Disposition: A | Discharge status: Home / Self Care | Payer: Medicare Other | Source: Ambulatory Visit | Attending: Cardiovascular Disease | Admitting: Cardiovascular Disease

## 2020-04-12 ENCOUNTER — Encounter (HOSPITAL_COMMUNITY)
Admission: RE | Admit: 2020-04-12 | Discharge: 2020-04-12 | Disposition: A | Discharge status: Home / Self Care | Payer: Medicare Other | Source: Ambulatory Visit | Attending: Cardiovascular Disease | Admitting: Cardiovascular Disease

## 2020-04-12 DIAGNOSIS — I5042 Chronic combined systolic (congestive) and diastolic (congestive) heart failure: Secondary | ICD-10-CM | POA: Insufficient documentation

## 2020-04-12 DIAGNOSIS — R0600 Dyspnea, unspecified: Secondary | ICD-10-CM | POA: Insufficient documentation

## 2020-04-12 DIAGNOSIS — R0609 Other forms of dyspnea: Secondary | ICD-10-CM

## 2020-04-12 NOTE — Progress Notes (Signed)
*  PRELIMINARY RESULTS* Echocardiogram 2D Echocardiogram has been performed.  Leavy Cella 04/12/2020, 1:29 PM

## 2020-04-15 ENCOUNTER — Encounter (HOSPITAL_COMMUNITY)
Admission: RE | Admit: 2020-04-15 | Discharge: 2020-04-15 | Disposition: A | Discharge status: Home / Self Care | Payer: Medicare Other | Source: Ambulatory Visit | Attending: Cardiovascular Disease | Admitting: Cardiovascular Disease

## 2020-04-15 ENCOUNTER — Encounter (HOSPITAL_COMMUNITY): Payer: Self-pay

## 2020-04-15 ENCOUNTER — Other Ambulatory Visit: Payer: Self-pay

## 2020-04-15 ENCOUNTER — Inpatient Hospital Stay (HOSPITAL_COMMUNITY): Admission: RE | Admit: 2020-04-15 | Payer: Medicare Other | Source: Ambulatory Visit

## 2020-04-15 ENCOUNTER — Ambulatory Visit (HOSPITAL_BASED_OUTPATIENT_CLINIC_OR_DEPARTMENT_OTHER)
Admission: RE | Admit: 2020-04-15 | Discharge: 2020-04-15 | Disposition: A | Discharge status: Home / Self Care | Payer: Medicare Other | Source: Ambulatory Visit | Attending: Cardiovascular Disease | Admitting: Cardiovascular Disease

## 2020-04-15 ENCOUNTER — Encounter (HOSPITAL_COMMUNITY)
Admission: RE | Admit: 2020-04-15 | Discharge: 2020-04-15 | Disposition: A | Discharge status: Home / Self Care | Payer: Medicare Other | Source: Ambulatory Visit | Attending: Vascular Surgery | Admitting: Vascular Surgery

## 2020-04-15 DIAGNOSIS — K660 Peritoneal adhesions (postprocedural) (postinfection): Secondary | ICD-10-CM | POA: Diagnosis not present

## 2020-04-15 DIAGNOSIS — Z86718 Personal history of other venous thrombosis and embolism: Secondary | ICD-10-CM | POA: Insufficient documentation

## 2020-04-15 DIAGNOSIS — D62 Acute posthemorrhagic anemia: Secondary | ICD-10-CM | POA: Diagnosis not present

## 2020-04-15 DIAGNOSIS — Z20822 Contact with and (suspected) exposure to covid-19: Secondary | ICD-10-CM | POA: Diagnosis not present

## 2020-04-15 DIAGNOSIS — I5042 Chronic combined systolic (congestive) and diastolic (congestive) heart failure: Secondary | ICD-10-CM | POA: Insufficient documentation

## 2020-04-15 DIAGNOSIS — Z7982 Long term (current) use of aspirin: Secondary | ICD-10-CM | POA: Insufficient documentation

## 2020-04-15 DIAGNOSIS — J95821 Acute postprocedural respiratory failure: Secondary | ICD-10-CM | POA: Diagnosis not present

## 2020-04-15 DIAGNOSIS — Z79899 Other long term (current) drug therapy: Secondary | ICD-10-CM | POA: Insufficient documentation

## 2020-04-15 DIAGNOSIS — I739 Peripheral vascular disease, unspecified: Secondary | ICD-10-CM | POA: Insufficient documentation

## 2020-04-15 DIAGNOSIS — Z888 Allergy status to other drugs, medicaments and biological substances status: Secondary | ICD-10-CM | POA: Diagnosis not present

## 2020-04-15 DIAGNOSIS — I959 Hypotension, unspecified: Secondary | ICD-10-CM | POA: Diagnosis not present

## 2020-04-15 DIAGNOSIS — F329 Major depressive disorder, single episode, unspecified: Secondary | ICD-10-CM | POA: Insufficient documentation

## 2020-04-15 DIAGNOSIS — I2510 Atherosclerotic heart disease of native coronary artery without angina pectoris: Secondary | ICD-10-CM | POA: Diagnosis not present

## 2020-04-15 DIAGNOSIS — K219 Gastro-esophageal reflux disease without esophagitis: Secondary | ICD-10-CM | POA: Insufficient documentation

## 2020-04-15 DIAGNOSIS — Z01812 Encounter for preprocedural laboratory examination: Secondary | ICD-10-CM | POA: Insufficient documentation

## 2020-04-15 DIAGNOSIS — Z7901 Long term (current) use of anticoagulants: Secondary | ICD-10-CM | POA: Insufficient documentation

## 2020-04-15 DIAGNOSIS — I70223 Atherosclerosis of native arteries of extremities with rest pain, bilateral legs: Secondary | ICD-10-CM | POA: Diagnosis not present

## 2020-04-15 DIAGNOSIS — Z981 Arthrodesis status: Secondary | ICD-10-CM | POA: Diagnosis not present

## 2020-04-15 DIAGNOSIS — R34 Anuria and oliguria: Secondary | ICD-10-CM | POA: Diagnosis not present

## 2020-04-15 DIAGNOSIS — Z880 Allergy status to penicillin: Secondary | ICD-10-CM | POA: Diagnosis not present

## 2020-04-15 DIAGNOSIS — K559 Vascular disorder of intestine, unspecified: Secondary | ICD-10-CM | POA: Diagnosis not present

## 2020-04-15 DIAGNOSIS — I5022 Chronic systolic (congestive) heart failure: Secondary | ICD-10-CM | POA: Diagnosis not present

## 2020-04-15 DIAGNOSIS — I110 Hypertensive heart disease with heart failure: Secondary | ICD-10-CM | POA: Insufficient documentation

## 2020-04-15 DIAGNOSIS — E7800 Pure hypercholesterolemia, unspecified: Secondary | ICD-10-CM | POA: Insufficient documentation

## 2020-04-15 DIAGNOSIS — R0600 Dyspnea, unspecified: Secondary | ICD-10-CM

## 2020-04-15 DIAGNOSIS — T8119XA Other postprocedural shock, initial encounter: Secondary | ICD-10-CM | POA: Diagnosis not present

## 2020-04-15 DIAGNOSIS — N179 Acute kidney failure, unspecified: Secondary | ICD-10-CM | POA: Diagnosis not present

## 2020-04-15 DIAGNOSIS — F17210 Nicotine dependence, cigarettes, uncomplicated: Secondary | ICD-10-CM | POA: Insufficient documentation

## 2020-04-15 DIAGNOSIS — L89626 Pressure-induced deep tissue damage of left heel: Secondary | ICD-10-CM | POA: Diagnosis not present

## 2020-04-15 DIAGNOSIS — K529 Noninfective gastroenteritis and colitis, unspecified: Secondary | ICD-10-CM | POA: Diagnosis not present

## 2020-04-15 DIAGNOSIS — I700 Atherosclerosis of aorta: Secondary | ICD-10-CM | POA: Diagnosis not present

## 2020-04-15 HISTORY — DX: Pneumonia, unspecified organism: J18.9

## 2020-04-15 HISTORY — DX: Dyspnea, unspecified: R06.00

## 2020-04-15 HISTORY — DX: Unspecified osteoarthritis, unspecified site: M19.90

## 2020-04-15 HISTORY — DX: Type 2 diabetes mellitus without complications: E11.9

## 2020-04-15 HISTORY — DX: Gastritis, unspecified, without bleeding: K29.70

## 2020-04-15 LAB — COMPREHENSIVE METABOLIC PANEL
ALT: 15 U/L (ref 0–44)
AST: 16 U/L (ref 15–41)
Albumin: 3.8 g/dL (ref 3.5–5.0)
Alkaline Phosphatase: 47 U/L (ref 38–126)
Anion gap: 10 (ref 5–15)
BUN: 15 mg/dL (ref 8–23)
CO2: 28 mmol/L (ref 22–32)
Calcium: 9.1 mg/dL (ref 8.9–10.3)
Chloride: 96 mmol/L — ABNORMAL LOW (ref 98–111)
Creatinine, Ser: 0.89 mg/dL (ref 0.44–1.00)
GFR calc Af Amer: >60 mL/min (ref >60)
GFR calc non Af Amer: >60 mL/min (ref >60)
Glucose, Bld: 209 mg/dL — ABNORMAL HIGH (ref 70–99)
Potassium: 4 mmol/L (ref 3.5–5.1)
Sodium: 134 mmol/L — ABNORMAL LOW (ref 135–145)
Total Bilirubin: 0.5 mg/dL (ref 0.3–1.2)
Total Protein: 7.6 g/dL (ref 6.5–8.1)

## 2020-04-15 LAB — URINALYSIS, ROUTINE W REFLEX MICROSCOPIC
Bacteria, UA: NONE SEEN
Bilirubin Urine: NEGATIVE
Glucose, UA: NEGATIVE mg/dL
Ketones, ur: NEGATIVE mg/dL
Leukocytes,Ua: NEGATIVE
Nitrite: NEGATIVE
Protein, ur: NEGATIVE mg/dL
Specific Gravity, Urine: 1.011 (ref 1.005–1.030)
pH: 6 (ref 5.0–8.0)

## 2020-04-15 LAB — PROTIME-INR
INR: 1.1 (ref 0.8–1.2)
Prothrombin Time: 13.3 seconds (ref 11.4–15.2)

## 2020-04-15 LAB — CBC
HCT: 39 % (ref 36.0–46.0)
Hemoglobin: 12.5 g/dL (ref 12.0–15.0)
MCH: 29.1 pg (ref 26.0–34.0)
MCHC: 32.1 g/dL (ref 30.0–36.0)
MCV: 90.9 fL (ref 80.0–100.0)
Platelets: 264 10*3/uL (ref 150–400)
RBC: 4.29 MIL/uL (ref 3.87–5.11)
RDW: 14.4 % (ref 11.5–15.5)
WBC: 11.2 10*3/uL — ABNORMAL HIGH (ref 4.0–10.5)
nRBC: 0 % (ref 0.0–0.2)

## 2020-04-15 LAB — NM MYOCAR MULTI W/SPECT W/WALL MOTION / EF
LV dias vol: 97 mL (ref 46–106)
LV sys vol: 55 mL
Peak HR: 81 {beats}/min
RATE: 0.49
Rest HR: 59 {beats}/min
SDS: 15
SRS: 0
SSS: 15
TID: 3.01

## 2020-04-15 LAB — APTT: aPTT: 35 seconds (ref 24–36)

## 2020-04-15 LAB — SURGICAL PCR SCREEN
MRSA, PCR: NEGATIVE
Staphylococcus aureus: NEGATIVE

## 2020-04-15 MED ORDER — TECHNETIUM TC 99M TETROFOSMIN IV KIT
10.0000 | PACK | Freq: Once | INTRAVENOUS | Status: AC | PRN
  Administered 2020-04-15: 9.89 via INTRAVENOUS

## 2020-04-15 MED ORDER — SODIUM CHLORIDE FLUSH 0.9 % IV SOLN
INTRAVENOUS | Status: AC
  Administered 2020-04-15: 10 mL via INTRAVENOUS
  Filled 2020-04-15: qty 10

## 2020-04-15 MED ORDER — REGADENOSON 0.4 MG/5ML IV SOLN
INTRAVENOUS | Status: AC
  Administered 2020-04-15: 0.4 mg via INTRAVENOUS
  Filled 2020-04-15: qty 5

## 2020-04-15 MED ORDER — TECHNETIUM TC 99M TETROFOSMIN IV KIT
30.0000 | PACK | Freq: Once | INTRAVENOUS | Status: AC | PRN
  Administered 2020-04-15: 28 via INTRAVENOUS

## 2020-04-15 NOTE — Pre-Procedure Instructions (Signed)
Your procedure is scheduled on Thursday, April 18 2020 from 09:30 AM- 2:17 PM.  Report to [facility] Main Entrance "A" at 07:30 A.M., and check in at the Admitting office.  Call this number if you have problems the morning of surgery:  [phone]  Call [phone] if you have any questions prior to your surgery date Monday-Friday 8am-4pm.    Remember:  Do not eat or drink after midnight the night before your surgery.    Take these medicines the morning of surgery with A SIP OF WATER: atorvastatin (LIPITOR) carvedilol (COREG) fexofenadine (ALLEGRA) PARoxetine (PAXIL)    *Follow your surgeon's instructions on when to stop Aspirin.  If no instructions were given by your surgeon then you will need to call the office to get those instructions.    As of today, STOP taking any Aleve, Naproxen, Ibuprofen, Motrin, Advil, Goody's, BC's, all herbal medications, fish oil, and all vitamins.          The Morning of Surgery:            Do not wear jewelry, make up, or nail polish.            Do not wear lotions, powders, perfumes, or deodorant.            Do not shave 48 hours prior to surgery.              Do not bring valuables to the hospital.            [facility] is not responsible for any belongings or valuables.  Do NOT Smoke (Tobacco/Vapping) or drink Alcohol 24 hours prior to your procedure.  If you use a CPAP at night, you may bring all equipment for your overnight stay.   Contacts, glasses, dentures or bridgework may not be worn into surgery.      For patients admitted to the hospital, discharge time will be determined by your treatment team.   Patients discharged the day of surgery will not be allowed to drive home, and someone needs to stay with them for 24 hours.    Special instructions:   [facility]- Preparing For Surgery  Before surgery, you can play an important role. Because skin is not sterile, your skin needs to be as free of germs as possible. You can reduce  the number of germs on your skin by washing with CHG (chlorahexidine gluconate) Soap before surgery.  CHG is an antiseptic cleaner which kills germs and bonds with the skin to continue killing germs even after washing.    Oral Hygiene is also important to reduce your risk of infection.  Remember - BRUSH YOUR TEETH THE MORNING OF SURGERY WITH YOUR REGULAR TOOTHPASTE  Please do not use if you have an allergy to CHG or antibacterial soaps. If your skin becomes reddened/irritated stop using the CHG.  Do not shave (including legs and underarms) for at least 48 hours prior to first CHG shower. It is OK to shave your face.  Please follow these instructions carefully.   1. Shower the NIGHT BEFORE SURGERY and the MORNING OF SURGERY with CHG Soap.   2. If you chose to wash your hair, wash your hair first as usual with your normal shampoo.  3. After you shampoo, rinse your hair and body thoroughly to remove the shampoo.  4. Use CHG as you would any other liquid soap. You can apply CHG directly to the skin and wash gently with a scrungie or a clean  washcloth.   5. Apply the CHG Soap to your body ONLY FROM THE NECK DOWN.  Do not use on open wounds or open sores. Avoid contact with your eyes, ears, mouth and genitals (private parts). Wash Face and genitals (private parts)  with your normal soap.   6. Wash thoroughly, paying special attention to the area where your surgery will be performed.  7. Thoroughly rinse your body with warm water from the neck down.  8. DO NOT shower/wash with your normal soap after using and rinsing off the CHG Soap.  9. Pat yourself dry with a CLEAN TOWEL.  10. Wear CLEAN PAJAMAS to bed the night before surgery, wear comfortable clothes the morning of surgery  11. Place CLEAN SHEETS on your bed the night of your first shower and DO NOT SLEEP WITH PETS.   Day of Surgery: Shower with CHG Soap.  Do not apply any deodorants/lotions.  Please wear clean clothes to the  hospital/surgery center.   Remember to brush your teeth WITH YOUR REGULAR TOOTHPASTE.   Please read over the following fact sheets that you were given.

## 2020-04-15 NOTE — Progress Notes (Signed)
PCP - Monico Blitz, MD Cardiologist - Kate Sable, MD  PPM/ICD - Denies  Chest x-ray - N/A EKG - 11/13/19 Stress Test - 04/15/20 ECHO - 04/12/20 Cardiac Cath - Denies  Sleep Study - Denies  Patient denies being a diabetic.  Blood Thinner Instructions: N/A Aspirin Instructions: Per patient, continue.  ERAS Protcol - No PRE-SURGERY Ensure or G2- N/A  COVID TEST- 04/17/20 @ 8208 @ [facility]   Anesthesia review: Yes, cardiac hx.  Patient denies shortness of breath, fever, cough and chest pain at PAT appointment   All instructions explained to the patient, with a verbal understanding of the material. Patient agrees to go over the instructions while at home for a better understanding. Patient also instructed to self quarantine after being tested for COVID-19. The opportunity to ask questions was provided.

## 2020-04-16 LAB — HEMOGLOBIN A1C
Hgb A1c MFr Bld: 6.9 % — ABNORMAL HIGH (ref 4.8–5.6)
Mean Plasma Glucose: 151.33 mg/dL

## 2020-04-16 NOTE — Progress Notes (Addendum)
Anesthesia Chart Review:  Case: [id number] Date/Time: 04/18/20 0915   Procedure: AORTA BIFEMORAL BYPASS (N/A )   Anesthesia type: General   Pre-op diagnosis: PERIPHERAL ARTERY DISEASE   Location: [facility] ROOM 11 / [facility]   Surgeons: Waynetta Sandy, MD      DISCUSSION: Patient is a 69 year old female scheduled for the above procedure.  History includes smoking, chronic combined systolic and diastolic CHF, HTN, hypercholesterolemia, DVT (post-op left soleal vein DVT 07/25/17), dyspnea, GERD, gastritis, depression, neck surgery (C5-6 ACDF 12/18/06), back surgery (L5-S1 posterolateral arthrodesis 08/12/07; left oblique lateral lumbar interbody fusion with plating L2-L4 03/21/17; s/p removal of posterior instrumentation from L4-S1 and extension of fusion to L2 and now with L2-S1 instrumentation 06/28/17; s/p open reduction of L1 compression fracture, extension of fusion T10-S1 07/16/17).   She was evaluated by cardiologist Dr. Bronson Ing on 03/26/11 for overdue follow-up and preoperative evaluation for AFBG. Echo and stress ordered (see below).  A1c added to 04/15/20 labs due to non-fasting glucose of 209. Result of 6.9%, which is consistent with a diabetes diagnosis with average glucose of 151. Dr. Donzetta Matters is aware. Diabetes Coordinator consult also entered. I attempted to call patient, but only got voicemail.  Preoperative COVID-19 test is scheduled for 04/17/2020. Will follow-up cardiology preoperative input and attempt to contact patient again regarding A1c results.   ADDENDUM 04/17/20 4:53 PM: I spoke with patient regarding A1c results consistent with new diagnosis of DM2. She reported "pre-diabetes" when she was pregnant, but otherwise denied diabetes history. She does urinate frequently, but associated with Lasix use. No polydipsia or rapid weight loss. She confirmed PCP is Monico Blitz, MD, and that I could send him results for follow-up (which I did). I advised that DM Coordinator consult was  requested while she is hospitalized, but that she would also need to schedule close follow-up with Dr. Manuella Ghazi. She is interested in post-operative nicotine patches, so staff message sent to Dr. Donzetta Matters. 04/17/20 COVID-19 test is still in process. Anesthesia team to evaluate on the day of surgery. In regards to cardiology input, Jenkins Rouge, MD wrote, "Low risk study no ischemia ok to proceed with surgery".   VS: BP (!) 170/56   Pulse 72   Temp 36.9 C (Oral)   Resp 20   Ht 5' 3"  (1.6 m)   Wt 80.9 kg   SpO2 100%   BMI 31.61 kg/m    PROVIDERS: Monico Blitz, MD his PCP Kate Sable, MD is cardiologist   LABS: Labs reviewed: Acceptable for surgery. A1c 6.9%.  (all labs ordered are listed, but only abnormal results are displayed)  Labs Reviewed  CBC - Abnormal; Notable for the following components:      Result Value   WBC 11.2 (*)    All other components within normal limits  COMPREHENSIVE METABOLIC PANEL - Abnormal; Notable for the following components:   Sodium 134 (*)    Chloride 96 (*)    Glucose, Bld 209 (*)    All other components within normal limits  URINALYSIS, ROUTINE W REFLEX MICROSCOPIC - Abnormal; Notable for the following components:   Hgb urine dipstick SMALL (*)    All other components within normal limits  SURGICAL PCR SCREEN  APTT  PROTIME-INR  TYPE AND SCREEN   PFTs 06/21/17 [facility]): FVC Pre-Actual L 2.05   FVC %Pred-Pre % 67.1   FVC LLN L 2.37   FEV1 Pre-Actual L 1.64   FEV1 %Pred-Pre % 69.9   FEV1 LLN L 1.78  FEV1FVC Pre-Actual % 80   FEV1FVC LLN % 67   SVC Pre-Actual L 2.04   SVC %Pred-Pre % 71.2   FEV1SVC Pre-Actual % 80     IMAGES: CTA Ao+BiFem 03/21/20: IMPRESSION: VASCULAR 1. Calcified plaque and stenosis in the distal abdominal aorta and at the aortic bifurcation. Aortoiliac disease is difficult to evaluate due to the spinal hardware artifact but the aortoiliac inflow disease appears to be patient's primary problem. 2.  Mild-to-moderate stenosis involving the origin and proximal left SFA. 3. No significant right outflow disease. 4. Mild left runoff disease. Two vessel runoff in the left lower extremity. Segmental occlusion of the left posterior tibial artery. 5. No significant right runoff disease. 6. Multiple areas of visceral artery stenosis as described. Probable bilateral renal artery stenosis and mild-to-moderate stenosis involving the celiac artery trunk. NON-VASCULAR 1. No acute abnormality in the abdomen or pelvis. 2. Extensive postsurgical changes involving the spine. 3. Periumbilical hernia containing fat.   EKG: 11/13/2019: Normal sinus rhythm Nonspecific T wave abnormality Abnormal ECG Confirmed by Buford Dresser [phone]) on 11/13/2019 10:24:48 PM   CV: Nuclear stress test 04/15/20:  Electrically negative for ischemia  Probable normal perfusion and soft tissue attenuation (diaphragm) No ischemia.  LVEF calculated at 44% Visual estimate suggests LVEF greater Consider echo if not done already to further define LVEF  This is a low risk study. (EF 60-65% by 04/12/20 echo)  Echo 04/12/20: IMPRESSIONS  1. Left ventricular ejection fraction, by estimation, is 60 to 65%. The  left ventricle has normal function. The left ventricle has no regional  wall motion abnormalities. There is mild left ventricular hypertrophy.  Left ventricular diastolic parameters  are consistent with Grade I diastolic dysfunction (impaired relaxation).  2. Right ventricular systolic function is normal. The right ventricular  size is normal.  3. The mitral valve is normal in structure. No evidence of mitral valve  regurgitation. No evidence of mitral stenosis.  4. The aortic valve is tricuspid. Aortic valve regurgitation is not  visualized. Moderate sclerosis without stenosis.  5. The inferior vena cava is normal in size with greater than 50%  respiratory variability, suggesting right atrial pressure  of 3 mmHg.    Aortogram with BLE angiography 11/13/19: Findings: Ultrasound evaluation left common femoral artery was heavily diseased.  It was difficult to pass a wire.  There is heavy disease of bilateral common iliac arteries in the distal aorta.  There is a pullback gradient of 40 mmHg systolic across the aortic and iliac lesion on the left.  The left SFA is occluded reconstitutes initially 3 vessels in the posterior tibial was then occluded has runoff via the anterior tibial and peroneal on the left.  On the right side the SFA is patent throughout its course with three-vessel runoff to the foot. - Patient will need consideration of aortobifemoral bypass.   Past Medical History:  Diagnosis Date  . Arthritis    HANDS  . Back pain   . Congestive heart failure (CHF) ([place])   . Depressive disorder   . DVT (deep venous thrombosis) ([place])    Right LE s/p back surgery stop date lovenox 09/05/17  . Dyspnea   . Gastritis   . GERD (gastroesophageal reflux disease)   . Hypercholesteremia   . Hypertension   . Pneumonia     Past Surgical History:  Procedure Laterality Date  . ABDOMINAL AORTOGRAM W/LOWER EXTREMITY Bilateral 11/13/2019   Procedure: ABDOMINAL AORTOGRAM W/LOWER EXTREMITY;  Surgeon: Waynetta Sandy, MD;  Location: [facility]  INVASIVE CV LAB;  Service: Cardiovascular;  Laterality: Bilateral;  . ABDOMINAL HYSTERECTOMY    . APPENDECTOMY    . BACK SURGERY    . CERVICAL SPINE SURGERY    . CHOLECYSTECTOMY    . DIAGNOSTIC LAPAROSCOPY     lap. chole  . LUMBAR SPINE SURGERY      MEDICATIONS: . aspirin EC 81 MG tablet  . atorvastatin (LIPITOR) 40 MG tablet  . carvedilol (COREG) 3.125 MG tablet  . fexofenadine (ALLEGRA) 180 MG tablet  . furosemide (LASIX) 40 MG tablet  . lisinopril (PRINIVIL,ZESTRIL) 5 MG tablet  . PARoxetine (PAXIL) 20 MG tablet  . potassium chloride SA (K-DUR,KLOR-CON) 20 MEQ tablet   No current facility-administered medications for this encounter.     Myra Gianotti, PA-C Surgical Short Stay/Anesthesiology [facility] Phone [phone] [facility] Phone [phone] 04/16/2020 5:29 PM

## 2020-04-17 ENCOUNTER — Telehealth: Payer: Self-pay | Admitting: Cardiovascular Disease

## 2020-04-17 ENCOUNTER — Other Ambulatory Visit (HOSPITAL_COMMUNITY)
Admission: RE | Admit: 2020-04-17 | Discharge: 2020-04-17 | Disposition: A | Discharge status: Home / Self Care | Payer: Medicare Other | Source: Ambulatory Visit | Attending: Vascular Surgery | Admitting: Vascular Surgery

## 2020-04-17 ENCOUNTER — Encounter (HOSPITAL_COMMUNITY): Payer: Self-pay

## 2020-04-17 DIAGNOSIS — Z20822 Contact with and (suspected) exposure to covid-19: Secondary | ICD-10-CM | POA: Insufficient documentation

## 2020-04-17 DIAGNOSIS — Z01812 Encounter for preprocedural laboratory examination: Secondary | ICD-10-CM | POA: Insufficient documentation

## 2020-04-17 LAB — SARS CORONAVIRUS 2 (TAT 6-24 HRS): SARS Coronavirus 2: NEGATIVE

## 2020-04-17 NOTE — Telephone Encounter (Signed)
Low risk study no ischemia ok to proceed with surgery

## 2020-04-17 NOTE — Anesthesia Preprocedure Evaluation (Addendum)
Anesthesia Evaluation  Patient identified by MRN, date of birth, ID band Patient awake    Reviewed: Allergy & Precautions, NPO status , Patient's Chart, lab work & pertinent test results  Airway Mallampati: II  TM Distance: >3 FB Neck ROM: Full    Dental  (+) Dental Advisory Given, Edentulous Upper, Missing   Pulmonary Current Smoker and Patient abstained from smoking.,    Pulmonary exam normal breath sounds clear to auscultation       Cardiovascular hypertension, Pt. on home beta blockers and Pt. on medications +CHF and + DVT  Normal cardiovascular exam Rhythm:Regular Rate:Normal  Echo 04/12/20: IMPRESSIONS  1. Left ventricular ejection fraction, by estimation, is 60 to 65%. The  left ventricle has normal function. The left ventricle has no regional  wall motion abnormalities. There is mild left ventricular hypertrophy.  Left ventricular diastolic parameters  are consistent with Grade I diastolic dysfunction (impaired relaxation).  2. Right ventricular systolic function is normal. The right ventricular  size is normal.  3. The mitral valve is normal in structure. No evidence of mitral valve  regurgitation. No evidence of mitral stenosis.  4. The aortic valve is tricuspid. Aortic valve regurgitation is not  visualized. Moderate sclerosis without stenosis.  5. The inferior vena cava is normal in size with greater than 50%  respiratory variability, suggesting right atrial pressure of 3 mmHg.    Neuro/Psych PSYCHIATRIC DISORDERS Depression C5-6 ACDF 12/18/06  Neuromuscular disease    GI/Hepatic Neg liver ROS, GERD  ,  Endo/Other  diabetesObesity   Renal/GU negative Renal ROS     Musculoskeletal  (+) Arthritis ,   Abdominal   Peds  Hematology negative hematology ROS (+)   Anesthesia Other Findings   Reproductive/Obstetrics                           Anesthesia Physical Anesthesia  Plan  ASA: IV  Anesthesia Plan: General   Post-op Pain Management:    Induction: Intravenous  PONV Risk Score and Plan: 2 and Midazolam, Dexamethasone and Ondansetron  Airway Management Planned: Oral ETT  Additional Equipment: Arterial line, CVP and Ultrasound Guidance Line Placement  Intra-op Plan:   Post-operative Plan: Possible Post-op intubation/ventilation  Informed Consent: I have reviewed the patients History and Physical, chart, labs and discussed the procedure including the risks, benefits and alternatives for the proposed anesthesia with the patient or authorized representative who has indicated his/her understanding and acceptance.     Dental advisory given  Plan Discussed with: CRNA  Anesthesia Plan Comments: (PAT note written by Myra Gianotti, PA-C. )     Anesthesia Quick Evaluation

## 2020-04-17 NOTE — Telephone Encounter (Signed)
Patient is having surgery tomorrow, Ebony Hail from Anesthesia is calling because she needs someone to sign off on the patients stress test. She was requesting to speak with someone in preop but I spoke with Coletta Memos and he stated that the patient's doctor or DOD will need to sign it. Please advise.

## 2020-04-17 NOTE — Telephone Encounter (Signed)
error 

## 2020-04-18 ENCOUNTER — Other Ambulatory Visit: Payer: Self-pay

## 2020-04-18 ENCOUNTER — Inpatient Hospital Stay (HOSPITAL_COMMUNITY): Payer: Medicare Other | Admitting: Vascular Surgery

## 2020-04-18 ENCOUNTER — Encounter (HOSPITAL_COMMUNITY)
Admission: AD | Disposition: A | Discharge status: Skilled Nursing Facility | Payer: Self-pay | Source: Home / Self Care | Attending: Vascular Surgery

## 2020-04-18 ENCOUNTER — Inpatient Hospital Stay (HOSPITAL_COMMUNITY)
Admission: AD | Admit: 2020-04-18 | Discharge: 2020-04-29 | DRG: 268 | Disposition: A | Discharge status: Skilled Nursing Facility | Payer: Medicare Other | Attending: Vascular Surgery | Admitting: Vascular Surgery

## 2020-04-18 ENCOUNTER — Inpatient Hospital Stay (HOSPITAL_COMMUNITY): Payer: Medicare Other

## 2020-04-18 ENCOUNTER — Encounter (HOSPITAL_COMMUNITY): Payer: Self-pay | Admitting: Vascular Surgery

## 2020-04-18 DIAGNOSIS — T8119XA Other postprocedural shock, initial encounter: Secondary | ICD-10-CM | POA: Diagnosis not present

## 2020-04-18 DIAGNOSIS — R34 Anuria and oliguria: Secondary | ICD-10-CM | POA: Diagnosis present

## 2020-04-18 DIAGNOSIS — J90 Pleural effusion, not elsewhere classified: Secondary | ICD-10-CM | POA: Diagnosis not present

## 2020-04-18 DIAGNOSIS — Z981 Arthrodesis status: Secondary | ICD-10-CM

## 2020-04-18 DIAGNOSIS — N179 Acute kidney failure, unspecified: Secondary | ICD-10-CM | POA: Diagnosis not present

## 2020-04-18 DIAGNOSIS — R197 Diarrhea, unspecified: Secondary | ICD-10-CM | POA: Diagnosis not present

## 2020-04-18 DIAGNOSIS — Z880 Allergy status to penicillin: Secondary | ICD-10-CM

## 2020-04-18 DIAGNOSIS — K660 Peritoneal adhesions (postprocedural) (postinfection): Secondary | ICD-10-CM | POA: Diagnosis present

## 2020-04-18 DIAGNOSIS — Z743 Need for continuous supervision: Secondary | ICD-10-CM | POA: Diagnosis not present

## 2020-04-18 DIAGNOSIS — Z79899 Other long term (current) drug therapy: Secondary | ICD-10-CM | POA: Diagnosis not present

## 2020-04-18 DIAGNOSIS — J95821 Acute postprocedural respiratory failure: Secondary | ICD-10-CM | POA: Diagnosis not present

## 2020-04-18 DIAGNOSIS — K529 Noninfective gastroenteritis and colitis, unspecified: Secondary | ICD-10-CM | POA: Diagnosis present

## 2020-04-18 DIAGNOSIS — D62 Acute posthemorrhagic anemia: Secondary | ICD-10-CM | POA: Diagnosis not present

## 2020-04-18 DIAGNOSIS — J9811 Atelectasis: Secondary | ICD-10-CM | POA: Diagnosis not present

## 2020-04-18 DIAGNOSIS — I779 Disorder of arteries and arterioles, unspecified: Secondary | ICD-10-CM | POA: Diagnosis not present

## 2020-04-18 DIAGNOSIS — Z781 Physical restraint status: Secondary | ICD-10-CM

## 2020-04-18 DIAGNOSIS — I959 Hypotension, unspecified: Secondary | ICD-10-CM | POA: Diagnosis present

## 2020-04-18 DIAGNOSIS — R6889 Other general symptoms and signs: Secondary | ICD-10-CM | POA: Diagnosis not present

## 2020-04-18 DIAGNOSIS — L89626 Pressure-induced deep tissue damage of left heel: Secondary | ICD-10-CM | POA: Diagnosis present

## 2020-04-18 DIAGNOSIS — M6281 Muscle weakness (generalized): Secondary | ICD-10-CM | POA: Diagnosis not present

## 2020-04-18 DIAGNOSIS — Z9911 Dependence on respirator [ventilator] status: Secondary | ICD-10-CM | POA: Diagnosis not present

## 2020-04-18 DIAGNOSIS — I5022 Chronic systolic (congestive) heart failure: Secondary | ICD-10-CM | POA: Diagnosis present

## 2020-04-18 DIAGNOSIS — R579 Shock, unspecified: Secondary | ICD-10-CM | POA: Diagnosis present

## 2020-04-18 DIAGNOSIS — Z48812 Encounter for surgical aftercare following surgery on the circulatory system: Secondary | ICD-10-CM | POA: Diagnosis not present

## 2020-04-18 DIAGNOSIS — Z7982 Long term (current) use of aspirin: Secondary | ICD-10-CM | POA: Diagnosis not present

## 2020-04-18 DIAGNOSIS — F17210 Nicotine dependence, cigarettes, uncomplicated: Secondary | ICD-10-CM | POA: Diagnosis present

## 2020-04-18 DIAGNOSIS — E7800 Pure hypercholesterolemia, unspecified: Secondary | ICD-10-CM | POA: Diagnosis not present

## 2020-04-18 DIAGNOSIS — I110 Hypertensive heart disease with heart failure: Secondary | ICD-10-CM | POA: Diagnosis not present

## 2020-04-18 DIAGNOSIS — I70223 Atherosclerosis of native arteries of extremities with rest pain, bilateral legs: Principal | ICD-10-CM | POA: Diagnosis present

## 2020-04-18 DIAGNOSIS — Z48815 Encounter for surgical aftercare following surgery on the digestive system: Secondary | ICD-10-CM | POA: Diagnosis not present

## 2020-04-18 DIAGNOSIS — J9690 Respiratory failure, unspecified, unspecified whether with hypoxia or hypercapnia: Secondary | ICD-10-CM | POA: Diagnosis not present

## 2020-04-18 DIAGNOSIS — I700 Atherosclerosis of aorta: Secondary | ICD-10-CM | POA: Diagnosis present

## 2020-04-18 DIAGNOSIS — I509 Heart failure, unspecified: Secondary | ICD-10-CM | POA: Diagnosis not present

## 2020-04-18 DIAGNOSIS — Z95828 Presence of other vascular implants and grafts: Secondary | ICD-10-CM

## 2020-04-18 DIAGNOSIS — J9600 Acute respiratory failure, unspecified whether with hypoxia or hypercapnia: Secondary | ICD-10-CM

## 2020-04-18 DIAGNOSIS — R195 Other fecal abnormalities: Secondary | ICD-10-CM | POA: Diagnosis not present

## 2020-04-18 DIAGNOSIS — I2510 Atherosclerotic heart disease of native coronary artery without angina pectoris: Secondary | ICD-10-CM | POA: Diagnosis present

## 2020-04-18 DIAGNOSIS — Z86718 Personal history of other venous thrombosis and embolism: Secondary | ICD-10-CM | POA: Diagnosis not present

## 2020-04-18 DIAGNOSIS — R0989 Other specified symptoms and signs involving the circulatory and respiratory systems: Secondary | ICD-10-CM | POA: Diagnosis not present

## 2020-04-18 DIAGNOSIS — Z20822 Contact with and (suspected) exposure to covid-19: Secondary | ICD-10-CM | POA: Diagnosis present

## 2020-04-18 DIAGNOSIS — K559 Vascular disorder of intestine, unspecified: Secondary | ICD-10-CM | POA: Diagnosis not present

## 2020-04-18 DIAGNOSIS — R58 Hemorrhage, not elsewhere classified: Secondary | ICD-10-CM | POA: Diagnosis not present

## 2020-04-18 DIAGNOSIS — Z888 Allergy status to other drugs, medicaments and biological substances status: Secondary | ICD-10-CM

## 2020-04-18 DIAGNOSIS — R2689 Other abnormalities of gait and mobility: Secondary | ICD-10-CM | POA: Diagnosis not present

## 2020-04-18 DIAGNOSIS — Z9889 Other specified postprocedural states: Secondary | ICD-10-CM

## 2020-04-18 DIAGNOSIS — T8189XA Other complications of procedures, not elsewhere classified, initial encounter: Secondary | ICD-10-CM | POA: Diagnosis not present

## 2020-04-18 DIAGNOSIS — R578 Other shock: Secondary | ICD-10-CM

## 2020-04-18 DIAGNOSIS — E872 Acidosis: Secondary | ICD-10-CM | POA: Diagnosis not present

## 2020-04-18 DIAGNOSIS — J9601 Acute respiratory failure with hypoxia: Secondary | ICD-10-CM | POA: Diagnosis not present

## 2020-04-18 DIAGNOSIS — R279 Unspecified lack of coordination: Secondary | ICD-10-CM | POA: Diagnosis not present

## 2020-04-18 DIAGNOSIS — J811 Chronic pulmonary edema: Secondary | ICD-10-CM | POA: Diagnosis not present

## 2020-04-18 DIAGNOSIS — R109 Unspecified abdominal pain: Secondary | ICD-10-CM | POA: Diagnosis not present

## 2020-04-18 HISTORY — PX: ENDARTERECTOMY FEMORAL: SHX5804

## 2020-04-18 HISTORY — PX: AORTA - BILATERAL FEMORAL ARTERY BYPASS GRAFT: SHX1175

## 2020-04-18 HISTORY — PX: AORTIC ENDARTERECETOMY: SHX5724

## 2020-04-18 LAB — POCT I-STAT 7, (LYTES, BLD GAS, ICA,H+H)
Acid-Base Excess: 1 mmol/L (ref 0.0–2.0)
Acid-base deficit: 7 mmol/L — ABNORMAL HIGH (ref 0.0–2.0)
Acid-base deficit: 9 mmol/L — ABNORMAL HIGH (ref 0.0–2.0)
Acid-base deficit: 7 mmol/L — ABNORMAL HIGH (ref 0.0–2.0)
Acid-base deficit: 8 mmol/L — ABNORMAL HIGH (ref 0.0–2.0)
Acid-base deficit: 5 mmol/L — ABNORMAL HIGH (ref 0.0–2.0)
Bicarbonate: 26.1 mmol/L (ref 20.0–28.0)
Bicarbonate: 19.6 mmol/L — ABNORMAL LOW (ref 20.0–28.0)
Bicarbonate: 18.6 mmol/L — ABNORMAL LOW (ref 20.0–28.0)
Bicarbonate: 18.1 mmol/L — ABNORMAL LOW (ref 20.0–28.0)
Bicarbonate: 18.3 mmol/L — ABNORMAL LOW (ref 20.0–28.0)
Bicarbonate: 22.6 mmol/L (ref 20.0–28.0)
Calcium, Ion: 1.16 mmol/L (ref 1.15–1.40)
Calcium, Ion: 1.04 mmol/L — ABNORMAL LOW (ref 1.15–1.40)
Calcium, Ion: 0.96 mmol/L — ABNORMAL LOW (ref 1.15–1.40)
Calcium, Ion: 1.02 mmol/L — ABNORMAL LOW (ref 1.15–1.40)
Calcium, Ion: 1.16 mmol/L (ref 1.15–1.40)
Calcium, Ion: 1.16 mmol/L (ref 1.15–1.40)
HCT: 33 % — ABNORMAL LOW (ref 36.0–46.0)
HCT: 32 % — ABNORMAL LOW (ref 36.0–46.0)
HCT: 25 % — ABNORMAL LOW (ref 36.0–46.0)
HCT: 25 % — ABNORMAL LOW (ref 36.0–46.0)
HCT: 26 % — ABNORMAL LOW (ref 36.0–46.0)
HCT: 34 % — ABNORMAL LOW (ref 36.0–46.0)
Hemoglobin: 11.2 g/dL — ABNORMAL LOW (ref 12.0–15.0)
Hemoglobin: 10.9 g/dL — ABNORMAL LOW (ref 12.0–15.0)
Hemoglobin: 8.5 g/dL — ABNORMAL LOW (ref 12.0–15.0)
Hemoglobin: 8.5 g/dL — ABNORMAL LOW (ref 12.0–15.0)
Hemoglobin: 8.8 g/dL — ABNORMAL LOW (ref 12.0–15.0)
Hemoglobin: 11.6 g/dL — ABNORMAL LOW (ref 12.0–15.0)
O2 Saturation: 100 %
O2 Saturation: 100 %
O2 Saturation: 100 %
O2 Saturation: 100 %
O2 Saturation: 100 %
O2 Saturation: 94 %
Patient temperature: 35.3
Patient temperature: 35.9
Patient temperature: 36.5
Patient temperature: 36.6
Patient temperature: 36.7
Patient temperature: 98.4
Potassium: 4.4 mmol/L (ref 3.5–5.1)
Potassium: 5.2 mmol/L — ABNORMAL HIGH (ref 3.5–5.1)
Potassium: 5.6 mmol/L — ABNORMAL HIGH (ref 3.5–5.1)
Potassium: 5.3 mmol/L — ABNORMAL HIGH (ref 3.5–5.1)
Potassium: 5.5 mmol/L — ABNORMAL HIGH (ref 3.5–5.1)
Potassium: 5.2 mmol/L — ABNORMAL HIGH (ref 3.5–5.1)
Sodium: 135 mmol/L (ref 135–145)
Sodium: 134 mmol/L — ABNORMAL LOW (ref 135–145)
Sodium: 137 mmol/L (ref 135–145)
Sodium: 138 mmol/L (ref 135–145)
Sodium: 139 mmol/L (ref 135–145)
Sodium: 138 mmol/L (ref 135–145)
TCO2: 27 mmol/L (ref 22–32)
TCO2: 21 mmol/L — ABNORMAL LOW (ref 22–32)
TCO2: 20 mmol/L — ABNORMAL LOW (ref 22–32)
TCO2: 19 mmol/L — ABNORMAL LOW (ref 22–32)
TCO2: 19 mmol/L — ABNORMAL LOW (ref 22–32)
TCO2: 24 mmol/L (ref 22–32)
pCO2 arterial: 41 mmHg (ref 32.0–48.0)
pCO2 arterial: 40.5 mmHg (ref 32.0–48.0)
pCO2 arterial: 45.1 mmHg (ref 32.0–48.0)
pCO2 arterial: 36.4 mmHg (ref 32.0–48.0)
pCO2 arterial: 37.8 mmHg (ref 32.0–48.0)
pCO2 arterial: 52.3 mmHg — ABNORMAL HIGH (ref 32.0–48.0)
pH, Arterial: 7.404 (ref 7.350–7.450)
pH, Arterial: 7.287 — ABNORMAL LOW (ref 7.350–7.450)
pH, Arterial: 7.22 — ABNORMAL LOW (ref 7.350–7.450)
pH, Arterial: 7.287 — ABNORMAL LOW (ref 7.350–7.450)
pH, Arterial: 7.308 — ABNORMAL LOW (ref 7.350–7.450)
pH, Arterial: 7.243 — ABNORMAL LOW (ref 7.350–7.450)
pO2, Arterial: 229 mmHg — ABNORMAL HIGH (ref 83.0–108.0)
pO2, Arterial: 198 mmHg — ABNORMAL HIGH (ref 83.0–108.0)
pO2, Arterial: 279 mmHg — ABNORMAL HIGH (ref 83.0–108.0)
pO2, Arterial: 214 mmHg — ABNORMAL HIGH (ref 83.0–108.0)
pO2, Arterial: 243 mmHg — ABNORMAL HIGH (ref 83.0–108.0)
pO2, Arterial: 84 mmHg (ref 83.0–108.0)

## 2020-04-18 LAB — MAGNESIUM: Magnesium: 1.5 mg/dL — ABNORMAL LOW (ref 1.7–2.4)

## 2020-04-18 LAB — COMPREHENSIVE METABOLIC PANEL
ALT: 48 U/L — ABNORMAL HIGH (ref 0–44)
AST: 79 U/L — ABNORMAL HIGH (ref 15–41)
Albumin: 3.2 g/dL — ABNORMAL LOW (ref 3.5–5.0)
Alkaline Phosphatase: 28 U/L — ABNORMAL LOW (ref 38–126)
Anion gap: 9 (ref 5–15)
BUN: 18 mg/dL (ref 8–23)
CO2: 22 mmol/L (ref 22–32)
Calcium: 7.9 mg/dL — ABNORMAL LOW (ref 8.9–10.3)
Chloride: 104 mmol/L (ref 98–111)
Creatinine, Ser: 1.35 mg/dL — ABNORMAL HIGH (ref 0.44–1.00)
GFR calc Af Amer: 47 mL/min — ABNORMAL LOW (ref >60)
GFR calc non Af Amer: 41 mL/min — ABNORMAL LOW (ref >60)
Glucose, Bld: 229 mg/dL — ABNORMAL HIGH (ref 70–99)
Potassium: 5.1 mmol/L (ref 3.5–5.1)
Sodium: 135 mmol/L (ref 135–145)
Total Bilirubin: 1.6 mg/dL — ABNORMAL HIGH (ref 0.3–1.2)
Total Protein: 4.6 g/dL — ABNORMAL LOW (ref 6.5–8.1)

## 2020-04-18 LAB — PREPARE RBC (CROSSMATCH)

## 2020-04-18 LAB — POCT I-STAT, CHEM 8
BUN: 21 mg/dL (ref 8–23)
BUN: 19 mg/dL (ref 8–23)
Calcium, Ion: 1.09 mmol/L — ABNORMAL LOW (ref 1.15–1.40)
Calcium, Ion: 1.19 mmol/L (ref 1.15–1.40)
Chloride: 100 mmol/L (ref 98–111)
Chloride: 102 mmol/L (ref 98–111)
Creatinine, Ser: 0.9 mg/dL (ref 0.44–1.00)
Creatinine, Ser: 1.3 mg/dL — ABNORMAL HIGH (ref 0.44–1.00)
Glucose, Bld: 189 mg/dL — ABNORMAL HIGH (ref 70–99)
Glucose, Bld: 237 mg/dL — ABNORMAL HIGH (ref 70–99)
HCT: 24 % — ABNORMAL LOW (ref 36.0–46.0)
HCT: 24 % — ABNORMAL LOW (ref 36.0–46.0)
Hemoglobin: 8.2 g/dL — ABNORMAL LOW (ref 12.0–15.0)
Hemoglobin: 8.2 g/dL — ABNORMAL LOW (ref 12.0–15.0)
Potassium: 5.1 mmol/L (ref 3.5–5.1)
Potassium: 5.3 mmol/L — ABNORMAL HIGH (ref 3.5–5.1)
Sodium: 135 mmol/L (ref 135–145)
Sodium: 139 mmol/L (ref 135–145)
TCO2: 24 mmol/L (ref 22–32)
TCO2: 20 mmol/L — ABNORMAL LOW (ref 22–32)

## 2020-04-18 LAB — CBC WITH DIFFERENTIAL/PLATELET
Abs Immature Granulocytes: 0.54 10*3/uL — ABNORMAL HIGH (ref 0.00–0.07)
Basophils Absolute: 0.1 10*3/uL (ref 0.0–0.1)
Basophils Relative: 0 %
Eosinophils Absolute: 0.1 10*3/uL (ref 0.0–0.5)
Eosinophils Relative: 0 %
HCT: 35.9 % — ABNORMAL LOW (ref 36.0–46.0)
Hemoglobin: 11.7 g/dL — ABNORMAL LOW (ref 12.0–15.0)
Immature Granulocytes: 2 %
Lymphocytes Relative: 7 %
Lymphs Abs: 1.5 10*3/uL (ref 0.7–4.0)
MCH: 28.7 pg (ref 26.0–34.0)
MCHC: 32.6 g/dL (ref 30.0–36.0)
MCV: 88.2 fL (ref 80.0–100.0)
Monocytes Absolute: 1.1 10*3/uL — ABNORMAL HIGH (ref 0.1–1.0)
Monocytes Relative: 5 %
Neutro Abs: 19.9 10*3/uL — ABNORMAL HIGH (ref 1.7–7.7)
Neutrophils Relative %: 86 %
Platelets: 201 10*3/uL (ref 150–400)
RBC: 4.07 MIL/uL (ref 3.87–5.11)
RDW: 16.3 % — ABNORMAL HIGH (ref 11.5–15.5)
WBC: 23.2 10*3/uL — ABNORMAL HIGH (ref 4.0–10.5)
nRBC: 0 % (ref 0.0–0.2)

## 2020-04-18 LAB — LACTIC ACID, PLASMA
Lactic Acid, Venous: 4.4 mmol/L (ref 0.5–1.9)
Lactic Acid, Venous: 2.4 mmol/L (ref 0.5–1.9)

## 2020-04-18 LAB — PROTIME-INR
INR: 1.5 — ABNORMAL HIGH (ref 0.8–1.2)
Prothrombin Time: 17.3 seconds — ABNORMAL HIGH (ref 11.4–15.2)

## 2020-04-18 LAB — GLUCOSE, CAPILLARY
Glucose-Capillary: 149 mg/dL — ABNORMAL HIGH (ref 70–99)
Glucose-Capillary: 199 mg/dL — ABNORMAL HIGH (ref 70–99)

## 2020-04-18 LAB — APTT: aPTT: 41 seconds — ABNORMAL HIGH (ref 24–36)

## 2020-04-18 LAB — HIV ANTIBODY (ROUTINE TESTING W REFLEX): HIV Screen 4th Generation wRfx: NONREACTIVE

## 2020-04-18 LAB — PHOSPHORUS: Phosphorus: 6 mg/dL — ABNORMAL HIGH (ref 2.5–4.6)

## 2020-04-18 SURGERY — CREATION, BYPASS, ARTERIAL, AORTA TO FEMORAL, BILATERAL, USING GRAFT
Anesthesia: General | Site: Groin | Laterality: Right

## 2020-04-18 MED ORDER — 0.9 % SODIUM CHLORIDE (POUR BTL) OPTIME
TOPICAL | Status: DC | PRN
  Administered 2020-04-18 (×2): 1000 mL

## 2020-04-18 MED ORDER — FUROSEMIDE 10 MG/ML IJ SOLN
INTRAMUSCULAR | Status: DC | PRN
  Administered 2020-04-18 (×2): 10 mg via INTRAMUSCULAR

## 2020-04-18 MED ORDER — VANCOMYCIN HCL IN DEXTROSE 1-5 GM/200ML-% IV SOLN
INTRAVENOUS | Status: AC
  Administered 2020-04-18: 1000 mg via INTRAVENOUS
  Filled 2020-04-18: qty 200

## 2020-04-18 MED ORDER — MORPHINE SULFATE (PF) 2 MG/ML IV SOLN
2.0000 mg | INTRAVENOUS | Status: DC | PRN
  Administered 2020-04-18 (×2): 2 mg via INTRAVENOUS
  Administered 2020-04-19 – 2020-04-23 (×23): 4 mg via INTRAVENOUS
  Administered 2020-04-24 – 2020-04-27 (×2): 2 mg via INTRAVENOUS
  Filled 2020-04-18 (×14): qty 2
  Filled 2020-04-18 (×2): qty 1
  Filled 2020-04-18 (×5): qty 2
  Filled 2020-04-18: qty 1
  Filled 2020-04-18 (×3): qty 2
  Filled 2020-04-18: qty 1
  Filled 2020-04-18 (×2): qty 2

## 2020-04-18 MED ORDER — DEXAMETHASONE SODIUM PHOSPHATE 10 MG/ML IJ SOLN
INTRAMUSCULAR | Status: DC | PRN
Start: 2020-04-18 — End: 2020-04-18
  Administered 2020-04-18: 5 mg via INTRAVENOUS

## 2020-04-18 MED ORDER — MIDAZOLAM HCL 2 MG/2ML IJ SOLN
INTRAMUSCULAR | Status: AC
  Administered 2020-04-18: 1 mg via INTRAVENOUS
  Filled 2020-04-18: qty 2

## 2020-04-18 MED ORDER — SODIUM CHLORIDE 0.9 % IV SOLN: INTRAVENOUS | Status: DC

## 2020-04-18 MED ORDER — PROPOFOL 10 MG/ML IV BOLUS
INTRAVENOUS | Status: DC | PRN
  Administered 2020-04-18: 50 mg via INTRAVENOUS
  Administered 2020-04-18: 20 mg via INTRAVENOUS

## 2020-04-18 MED ORDER — CHLORHEXIDINE GLUCONATE 0.12 % MT SOLN
OROMUCOSAL | Status: AC
  Administered 2020-04-18: 15 mL via OROMUCOSAL
  Filled 2020-04-18: qty 15

## 2020-04-18 MED ORDER — VANCOMYCIN HCL IN DEXTROSE 1-5 GM/200ML-% IV SOLN: 1000.0000 mg | INTRAVENOUS | Status: AC

## 2020-04-18 MED ORDER — CHLORHEXIDINE GLUCONATE 0.12% ORAL RINSE (MEDLINE KIT)
15.0000 mL | Freq: Two times a day (BID) | OROMUCOSAL | Status: DC
  Administered 2020-04-18 – 2020-04-21 (×6): 15 mL via OROMUCOSAL

## 2020-04-18 MED ORDER — ROCURONIUM BROMIDE 10 MG/ML (PF) SYRINGE
PREFILLED_SYRINGE | INTRAVENOUS | Status: DC | PRN
  Administered 2020-04-18: 30 mg via INTRAVENOUS
  Administered 2020-04-18: 20 mg via INTRAVENOUS
  Administered 2020-04-18: 40 mg via INTRAVENOUS
  Administered 2020-04-18: 30 mg via INTRAVENOUS
  Administered 2020-04-18: 60 mg via INTRAVENOUS
  Administered 2020-04-18: 40 mg via INTRAVENOUS

## 2020-04-18 MED ORDER — POTASSIUM CHLORIDE CRYS ER 20 MEQ PO TBCR: 20.0000 meq | EXTENDED_RELEASE_TABLET | Freq: Every day | ORAL | Status: DC | PRN

## 2020-04-18 MED ORDER — GUAIFENESIN-DM 100-10 MG/5ML PO SYRP: 15.0000 mL | ORAL_SOLUTION | ORAL | Status: DC | PRN

## 2020-04-18 MED ORDER — SODIUM CHLORIDE 0.9 % IV SOLN: 250.0000 mL | INTRAVENOUS | Status: DC

## 2020-04-18 MED ORDER — SODIUM CHLORIDE 0.9 % IV SOLN
INTRAVENOUS | Status: DC | PRN
  Administered 2020-04-18: 500 mL

## 2020-04-18 MED ORDER — CHLORHEXIDINE GLUCONATE CLOTH 2 % EX PADS: 6.0000 | MEDICATED_PAD | Freq: Once | CUTANEOUS | Status: DC

## 2020-04-18 MED ORDER — ACETAMINOPHEN 500 MG PO TABS
1000.0000 mg | ORAL_TABLET | Freq: Once | ORAL | Status: AC
  Administered 2020-04-18: 1000 mg via ORAL
  Filled 2020-04-18: qty 2

## 2020-04-18 MED ORDER — VANCOMYCIN HCL 1000 MG IV SOLR
INTRAVENOUS | Status: DC | PRN
  Administered 2020-04-18: 1000 mg via INTRAVENOUS

## 2020-04-18 MED ORDER — OXYCODONE HCL 5 MG PO TABS
5.0000 mg | ORAL_TABLET | ORAL | Status: DC | PRN
  Administered 2020-04-24: 5 mg via ORAL
  Administered 2020-04-24 – 2020-04-29 (×8): 10 mg via ORAL
  Filled 2020-04-18: qty 2
  Filled 2020-04-18: qty 1
  Filled 2020-04-18 (×7): qty 2

## 2020-04-18 MED ORDER — ALBUMIN HUMAN 5 % IV SOLN
INTRAVENOUS | Status: DC | PRN
Start: 2020-04-18 — End: 2020-04-18

## 2020-04-18 MED ORDER — MAGNESIUM SULFATE 2 GM/50ML IV SOLN
2.0000 g | Freq: Every day | INTRAVENOUS | Status: AC | PRN
  Administered 2020-04-19: 2 g via INTRAVENOUS
  Filled 2020-04-18: qty 50

## 2020-04-18 MED ORDER — HEMOSTATIC AGENTS (NO CHARGE) OPTIME
TOPICAL | Status: DC | PRN
  Administered 2020-04-18 (×2): 1 via TOPICAL

## 2020-04-18 MED ORDER — SODIUM CHLORIDE 0.9% IV SOLUTION: Freq: Once | INTRAVENOUS | Status: DC

## 2020-04-18 MED ORDER — ALBUTEROL SULFATE (2.5 MG/3ML) 0.083% IN NEBU
2.5000 mg | INHALATION_SOLUTION | RESPIRATORY_TRACT | Status: DC
  Administered 2020-04-18 – 2020-04-19 (×3): 2.5 mg via RESPIRATORY_TRACT
  Filled 2020-04-18 (×3): qty 3

## 2020-04-18 MED ORDER — CALCIUM CHLORIDE 10 % IV SOLN
INTRAVENOUS | Status: DC | PRN
Start: 2020-04-18 — End: 2020-04-18
  Administered 2020-04-18 (×2): 500 mg via INTRAVENOUS

## 2020-04-18 MED ORDER — LACTATED RINGERS IV SOLN: INTRAVENOUS | Status: DC

## 2020-04-18 MED ORDER — INSULIN ASPART 100 UNIT/ML ~~LOC~~ SOLN
0.0000 [IU] | SUBCUTANEOUS | Status: DC
  Administered 2020-04-18 – 2020-04-19 (×2): 3 [IU] via SUBCUTANEOUS
  Administered 2020-04-19 (×2): 2 [IU] via SUBCUTANEOUS
  Administered 2020-04-19 – 2020-04-20 (×6): 3 [IU] via SUBCUTANEOUS
  Administered 2020-04-20: 2 [IU] via SUBCUTANEOUS
  Administered 2020-04-20 (×3): 3 [IU] via SUBCUTANEOUS
  Administered 2020-04-21 (×5): 2 [IU] via SUBCUTANEOUS
  Administered 2020-04-23 (×3): 3 [IU] via SUBCUTANEOUS
  Administered 2020-04-24: 5 [IU] via SUBCUTANEOUS
  Administered 2020-04-24: 3 [IU] via SUBCUTANEOUS
  Administered 2020-04-24: 2 [IU] via SUBCUTANEOUS
  Administered 2020-04-24 – 2020-04-25 (×3): 3 [IU] via SUBCUTANEOUS
  Administered 2020-04-25 (×4): 2 [IU] via SUBCUTANEOUS
  Administered 2020-04-26: 3 [IU] via SUBCUTANEOUS
  Administered 2020-04-26 – 2020-04-28 (×6): 2 [IU] via SUBCUTANEOUS
  Administered 2020-04-28: 3 [IU] via SUBCUTANEOUS

## 2020-04-18 MED ORDER — ALUM & MAG HYDROXIDE-SIMETH 200-200-20 MG/5ML PO SUSP: 15.0000 mL | ORAL | Status: DC | PRN

## 2020-04-18 MED ORDER — PHENYLEPHRINE 40 MCG/ML (10ML) SYRINGE FOR IV PUSH (FOR BLOOD PRESSURE SUPPORT)
PREFILLED_SYRINGE | INTRAVENOUS | Status: DC | PRN
  Administered 2020-04-18: 80 ug via INTRAVENOUS
  Administered 2020-04-18: 120 ug via INTRAVENOUS
  Administered 2020-04-18 (×2): 80 ug via INTRAVENOUS
  Administered 2020-04-18: 40 ug via INTRAVENOUS
  Administered 2020-04-18 (×3): 80 ug via INTRAVENOUS
  Administered 2020-04-18 (×2): 20 ug via INTRAVENOUS
  Administered 2020-04-18: 80 ug via INTRAVENOUS

## 2020-04-18 MED ORDER — HEPARIN SODIUM (PORCINE) 5000 UNIT/ML IJ SOLN: 5000.0000 [IU] | Freq: Three times a day (TID) | INTRAMUSCULAR | Status: DC

## 2020-04-18 MED ORDER — ACETAMINOPHEN 325 MG PO TABS: 650.0000 mg | ORAL_TABLET | ORAL | Status: DC | PRN

## 2020-04-18 MED ORDER — ACETAMINOPHEN 325 MG RE SUPP
325.0000 mg | RECTAL | Status: DC | PRN
  Filled 2020-04-18: qty 2

## 2020-04-18 MED ORDER — POLYETHYLENE GLYCOL 3350 17 G PO PACK: 17.0000 g | PACK | Freq: Every day | ORAL | Status: DC | PRN

## 2020-04-18 MED ORDER — ONDANSETRON HCL 4 MG/2ML IJ SOLN: 4.0000 mg | Freq: Four times a day (QID) | INTRAMUSCULAR | Status: DC | PRN

## 2020-04-18 MED ORDER — SODIUM CHLORIDE 0.9 % IV SOLN: 500.0000 mL | Freq: Once | INTRAVENOUS | Status: DC | PRN

## 2020-04-18 MED ORDER — HEPARIN SODIUM (PORCINE) 5000 UNIT/ML IJ SOLN
5000.0000 [IU] | Freq: Three times a day (TID) | INTRAMUSCULAR | Status: DC
  Administered 2020-04-19 – 2020-04-29 (×31): 5000 [IU] via SUBCUTANEOUS
  Filled 2020-04-18 (×31): qty 1

## 2020-04-18 MED ORDER — PROPOFOL 10 MG/ML IV BOLUS
INTRAVENOUS | Status: AC
  Filled 2020-04-18: qty 40

## 2020-04-18 MED ORDER — BUDESONIDE 0.25 MG/2ML IN SUSP
0.2500 mg | Freq: Two times a day (BID) | RESPIRATORY_TRACT | Status: DC
  Administered 2020-04-18 – 2020-04-20 (×4): 0.25 mg via RESPIRATORY_TRACT
  Filled 2020-04-18 (×4): qty 2

## 2020-04-18 MED ORDER — SODIUM CHLORIDE 0.9 % IV SOLN
INTRAVENOUS | Status: AC
  Filled 2020-04-18: qty 1.2

## 2020-04-18 MED ORDER — VASOPRESSIN 20 UNIT/ML IV SOLN
INTRAVENOUS | Status: AC
  Filled 2020-04-18: qty 1

## 2020-04-18 MED ORDER — HYDRALAZINE HCL 20 MG/ML IJ SOLN: 5.0000 mg | INTRAMUSCULAR | Status: DC | PRN

## 2020-04-18 MED ORDER — FENTANYL CITRATE (PF) 250 MCG/5ML IJ SOLN
INTRAMUSCULAR | Status: AC
  Filled 2020-04-18: qty 5

## 2020-04-18 MED ORDER — PROPOFOL 500 MG/50ML IV EMUL
INTRAVENOUS | Status: DC | PRN
Start: 2020-04-18 — End: 2020-04-18
  Administered 2020-04-18: 75 ug/kg/min via INTRAVENOUS

## 2020-04-18 MED ORDER — ORAL CARE MOUTH RINSE: 15.0000 mL | Freq: Once | OROMUCOSAL | Status: AC

## 2020-04-18 MED ORDER — DEXMEDETOMIDINE HCL IN NACL 400 MCG/100ML IV SOLN
0.4000 ug/kg/h | INTRAVENOUS | Status: DC
  Administered 2020-04-18: 0.4 ug/kg/h via INTRAVENOUS
  Administered 2020-04-19 (×2): 0.8 ug/kg/h via INTRAVENOUS
  Administered 2020-04-19: 1 ug/kg/h via INTRAVENOUS
  Administered 2020-04-19: 0.7 ug/kg/h via INTRAVENOUS
  Administered 2020-04-19 – 2020-04-21 (×5): 1 ug/kg/h via INTRAVENOUS
  Administered 2020-04-22: 1.2 ug/kg/h via INTRAVENOUS
  Administered 2020-04-22: 1 ug/kg/h via INTRAVENOUS
  Administered 2020-04-22: 1.1 ug/kg/h via INTRAVENOUS
  Filled 2020-04-18 (×17): qty 100

## 2020-04-18 MED ORDER — CHLORHEXIDINE GLUCONATE 0.12 % MT SOLN: 15.0000 mL | Freq: Once | OROMUCOSAL | Status: AC

## 2020-04-18 MED ORDER — SODIUM CHLORIDE 0.9 % IV BOLUS
1000.0000 mL | Freq: Once | INTRAVENOUS | Status: AC
  Administered 2020-04-18: 1000 mL via INTRAVENOUS

## 2020-04-18 MED ORDER — ORAL CARE MOUTH RINSE
15.0000 mL | OROMUCOSAL | Status: DC
  Administered 2020-04-18 – 2020-04-21 (×30): 15 mL via OROMUCOSAL

## 2020-04-18 MED ORDER — DOCUSATE SODIUM 100 MG PO CAPS: 100.0000 mg | ORAL_CAPSULE | Freq: Two times a day (BID) | ORAL | Status: DC | PRN

## 2020-04-18 MED ORDER — VASOPRESSIN 20 UNIT/ML IV SOLN
INTRAVENOUS | Status: DC | PRN
Start: 2020-04-18 — End: 2020-04-18
  Administered 2020-04-18 (×4): 1 [IU] via INTRAVENOUS

## 2020-04-18 MED ORDER — FAMOTIDINE IN NACL 20-0.9 MG/50ML-% IV SOLN
20.0000 mg | Freq: Every day | INTRAVENOUS | Status: DC
  Administered 2020-04-18 – 2020-04-26 (×9): 20 mg via INTRAVENOUS
  Filled 2020-04-18 (×10): qty 50

## 2020-04-18 MED ORDER — LABETALOL HCL 5 MG/ML IV SOLN
10.0000 mg | INTRAVENOUS | Status: DC | PRN
  Administered 2020-04-24: 10 mg via INTRAVENOUS
  Filled 2020-04-18: qty 4

## 2020-04-18 MED ORDER — SODIUM BICARBONATE 8.4 % IV SOLN
INTRAVENOUS | Status: DC | PRN
Start: 2020-04-18 — End: 2020-04-18
  Administered 2020-04-18 (×2): 50 meq via INTRAVENOUS

## 2020-04-18 MED ORDER — PHENOL 1.4 % MT LIQD
1.0000 | OROMUCOSAL | Status: DC | PRN
  Administered 2020-04-21: 1 via OROMUCOSAL
  Filled 2020-04-18: qty 177

## 2020-04-18 MED ORDER — HEPARIN SODIUM (PORCINE) 1000 UNIT/ML IJ SOLN
INTRAMUSCULAR | Status: DC | PRN
  Administered 2020-04-18: 5000 [IU] via INTRAVENOUS
  Administered 2020-04-18: 8000 [IU] via INTRAVENOUS

## 2020-04-18 MED ORDER — NOREPINEPHRINE 4 MG/250ML-% IV SOLN
5.0000 ug/min | INTRAVENOUS | Status: DC
  Administered 2020-04-19: 10 ug/min via INTRAVENOUS
  Administered 2020-04-19: 6 ug/min via INTRAVENOUS
  Administered 2020-04-19: 4 ug/min via INTRAVENOUS
  Administered 2020-04-20: 3 ug/min via INTRAVENOUS
  Filled 2020-04-18 (×5): qty 250

## 2020-04-18 MED ORDER — ONDANSETRON HCL 4 MG/2ML IJ SOLN
4.0000 mg | Freq: Four times a day (QID) | INTRAMUSCULAR | Status: DC | PRN
  Administered 2020-04-26 – 2020-04-29 (×3): 4 mg via INTRAVENOUS
  Filled 2020-04-18 (×4): qty 2

## 2020-04-18 MED ORDER — FENTANYL CITRATE (PF) 100 MCG/2ML IJ SOLN
INTRAMUSCULAR | Status: AC
  Administered 2020-04-18: 50 ug via INTRAVENOUS
  Filled 2020-04-18: qty 2

## 2020-04-18 MED ORDER — ARFORMOTEROL TARTRATE 15 MCG/2ML IN NEBU
15.0000 ug | INHALATION_SOLUTION | Freq: Two times a day (BID) | RESPIRATORY_TRACT | Status: DC
  Administered 2020-04-18 – 2020-04-27 (×15): 15 ug via RESPIRATORY_TRACT
  Filled 2020-04-18 (×21): qty 2

## 2020-04-18 MED ORDER — PROTAMINE SULFATE 10 MG/ML IV SOLN
INTRAVENOUS | Status: DC | PRN
Start: 2020-04-18 — End: 2020-04-18
  Administered 2020-04-18: 50 mg via INTRAVENOUS

## 2020-04-18 MED ORDER — NOREPINEPHRINE 4 MG/250ML-% IV SOLN
INTRAVENOUS | Status: DC | PRN
Start: 2020-04-18 — End: 2020-04-18
  Administered 2020-04-18: 2 ug/min via INTRAVENOUS

## 2020-04-18 MED ORDER — MIDAZOLAM HCL 2 MG/2ML IJ SOLN: 1.0000 mg | Freq: Once | INTRAMUSCULAR | Status: AC

## 2020-04-18 MED ORDER — ACETAMINOPHEN 325 MG PO TABS
325.0000 mg | ORAL_TABLET | ORAL | Status: DC | PRN
  Administered 2020-04-19: 325 mg via ORAL
  Administered 2020-04-20 – 2020-04-29 (×5): 650 mg via ORAL
  Filled 2020-04-18 (×6): qty 2

## 2020-04-18 MED ORDER — FENTANYL CITRATE (PF) 100 MCG/2ML IJ SOLN: 50.0000 ug | Freq: Once | INTRAMUSCULAR | Status: AC

## 2020-04-18 MED ORDER — BISACODYL 10 MG RE SUPP: 10.0000 mg | Freq: Every day | RECTAL | Status: DC | PRN

## 2020-04-18 MED ORDER — LIDOCAINE 2% (20 MG/ML) 5 ML SYRINGE
INTRAMUSCULAR | Status: DC | PRN
  Administered 2020-04-18: 80 mg via INTRAVENOUS

## 2020-04-18 MED ORDER — METOPROLOL TARTRATE 5 MG/5ML IV SOLN: 2.0000 mg | INTRAVENOUS | Status: DC | PRN

## 2020-04-18 MED ORDER — FENTANYL CITRATE (PF) 100 MCG/2ML IJ SOLN
INTRAMUSCULAR | Status: DC | PRN
  Administered 2020-04-18: 100 ug via INTRAVENOUS
  Administered 2020-04-18: 50 ug via INTRAVENOUS
  Administered 2020-04-18: 100 ug via INTRAVENOUS
  Administered 2020-04-18: 50 ug via INTRAVENOUS

## 2020-04-18 MED ORDER — ALBUMIN HUMAN 5 % IV SOLN
25.0000 g | INTRAVENOUS | Status: AC
  Administered 2020-04-18 (×2): 25 g via INTRAVENOUS
  Filled 2020-04-18 (×2): qty 500

## 2020-04-18 MED ORDER — FENTANYL CITRATE (PF) 100 MCG/2ML IJ SOLN
50.0000 ug | INTRAMUSCULAR | Status: DC | PRN
  Administered 2020-04-18 – 2020-04-19 (×3): 50 ug via INTRAVENOUS
  Filled 2020-04-18 (×3): qty 2

## 2020-04-18 MED ORDER — PHENYLEPHRINE HCL-NACL 10-0.9 MG/250ML-% IV SOLN
INTRAVENOUS | Status: DC | PRN
Start: 2020-04-18 — End: 2020-04-18
  Administered 2020-04-18: 30 ug/min via INTRAVENOUS
  Administered 2020-04-18: 50 ug/min via INTRAVENOUS

## 2020-04-18 MED ORDER — CHLORHEXIDINE GLUCONATE CLOTH 2 % EX PADS
6.0000 | MEDICATED_PAD | Freq: Every day | CUTANEOUS | Status: DC
  Administered 2020-04-18 – 2020-04-28 (×10): 6 via TOPICAL

## 2020-04-18 SURGICAL SUPPLY — 66 items
ADH SKN CLS APL DERMABOND .7 (GAUZE/BANDAGES/DRESSINGS) ×4
CANISTER SUCT 3000ML PPV (MISCELLANEOUS) ×4 IMPLANT
CLIP VESOCCLUDE MED 24/CT (CLIP) ×4 IMPLANT
CLIP VESOCCLUDE MED 6/CT (CLIP) ×2 IMPLANT
CLIP VESOCCLUDE SM WIDE 24/CT (CLIP) ×4 IMPLANT
COVER WAND RF STERILE (DRAPES) ×2 IMPLANT
DERMABOND ADVANCED (GAUZE/BANDAGES/DRESSINGS) ×4
DERMABOND ADVANCED .7 DNX12 (GAUZE/BANDAGES/DRESSINGS) ×4 IMPLANT
DRSG COVADERM 4X14 (GAUZE/BANDAGES/DRESSINGS) ×2 IMPLANT
DRSG COVADERM 4X6 (GAUZE/BANDAGES/DRESSINGS) ×2 IMPLANT
DRSG COVADERM 4X8 (GAUZE/BANDAGES/DRESSINGS) ×2 IMPLANT
ELECT BLADE 4.0 EZ CLEAN MEGAD (MISCELLANEOUS) ×4
ELECT BLADE 6.5 EXT (BLADE) ×2 IMPLANT
ELECT CAUTERY BLADE 6.4 (BLADE) ×2 IMPLANT
ELECT REM PT RETURN 9FT ADLT (ELECTROSURGICAL) ×4
ELECTRODE BLDE 4.0 EZ CLN MEGD (MISCELLANEOUS) ×2 IMPLANT
ELECTRODE REM PT RTRN 9FT ADLT (ELECTROSURGICAL) ×2 IMPLANT
FELT TEFLON 1X6 (MISCELLANEOUS) ×2 IMPLANT
GAUZE SPONGE 4X4 12PLY STRL (GAUZE/BANDAGES/DRESSINGS) ×2 IMPLANT
GLOVE BIO SURGEON STRL SZ7.5 (GLOVE) ×4 IMPLANT
GOWN STRL REUS W/ TWL LRG LVL3 (GOWN DISPOSABLE) ×4 IMPLANT
GOWN STRL REUS W/ TWL XL LVL3 (GOWN DISPOSABLE) ×2 IMPLANT
GOWN STRL REUS W/TWL LRG LVL3 (GOWN DISPOSABLE) ×8
GOWN STRL REUS W/TWL XL LVL3 (GOWN DISPOSABLE) ×4
GRAFT HEMASHIELD 14X7MM (Vascular Products) ×2 IMPLANT
GRAFT HEMASHIELD 16MM (Vascular Products) ×2 IMPLANT
HEMOSTAT SNOW SURGICEL 2X4 (HEMOSTASIS) ×4 IMPLANT
INSERT FOGARTY 61MM (MISCELLANEOUS) ×2 IMPLANT
INSERT FOGARTY SM (MISCELLANEOUS) ×8 IMPLANT
KIT BASIN OR (CUSTOM PROCEDURE TRAY) ×4 IMPLANT
KIT TURNOVER KIT B (KITS) ×4 IMPLANT
NS IRRIG 1000ML POUR BTL (IV SOLUTION) ×12 IMPLANT
PACK AORTA (CUSTOM PROCEDURE TRAY) ×4 IMPLANT
PAD ARMBOARD 7.5X6 YLW CONV (MISCELLANEOUS) ×8 IMPLANT
PENCIL BUTTON HOLSTER BLD 10FT (ELECTRODE) ×2 IMPLANT
RETAINER VISCERA MED (MISCELLANEOUS) ×4 IMPLANT
SPONGE LAP 18X18 RF (DISPOSABLE) ×14 IMPLANT
STAPLER VISISTAT 35W (STAPLE) ×4 IMPLANT
SUT MNCRL AB 4-0 PS2 18 (SUTURE) ×12 IMPLANT
SUT PDS AB 1 TP1 54 (SUTURE) ×8 IMPLANT
SUT PROLENE 3 0 SH 48 (SUTURE) ×10 IMPLANT
SUT PROLENE 4 0 RB 1 (SUTURE) ×4
SUT PROLENE 4 0 SH DA (SUTURE) ×6 IMPLANT
SUT PROLENE 4-0 RB1 .5 CRCL 36 (SUTURE) IMPLANT
SUT PROLENE 5 0 C 1 24 (SUTURE) ×20 IMPLANT
SUT PROLENE 5 0 C 1 36 (SUTURE) ×6 IMPLANT
SUT PROLENE 6 0 BV (SUTURE) ×4 IMPLANT
SUT SILK 2 0 (SUTURE) ×4
SUT SILK 2 0 TIES 17X18 (SUTURE) ×4
SUT SILK 2 0SH CR/8 30 (SUTURE) ×4 IMPLANT
SUT SILK 2-0 18XBRD TIE 12 (SUTURE) ×2 IMPLANT
SUT SILK 2-0 18XBRD TIE BLK (SUTURE) ×2 IMPLANT
SUT SILK 3 0 (SUTURE) ×8
SUT SILK 3 0 TIES 17X18 (SUTURE) ×4
SUT SILK 3-0 18XBRD TIE 12 (SUTURE) ×2 IMPLANT
SUT SILK 3-0 18XBRD TIE BLK (SUTURE) ×2 IMPLANT
SUT SILK 4 0 (SUTURE) ×4
SUT SILK 4-0 18XBRD TIE 12 (SUTURE) IMPLANT
SUT VIC AB 2-0 CT1 27 (SUTURE) ×20
SUT VIC AB 2-0 CT1 TAPERPNT 27 (SUTURE) ×10 IMPLANT
SUT VIC AB 3-0 SH 27 (SUTURE) ×24
SUT VIC AB 3-0 SH 27X BRD (SUTURE) ×4 IMPLANT
TOWEL GREEN STERILE (TOWEL DISPOSABLE) ×4 IMPLANT
TOWEL SURG RFD BLUE STRL DISP (DISPOSABLE) ×4 IMPLANT
TRAY FOLEY MTR SLVR 16FR STAT (SET/KITS/TRAYS/PACK) ×4 IMPLANT
WATER STERILE IRR 1000ML POUR (IV SOLUTION) ×8 IMPLANT

## 2020-04-18 NOTE — Transfer of Care (Signed)
Immediate Anesthesia Transfer of Care Note  Patient: Tonya Hughes  Procedure(s) Performed: AORTA BIFEMORAL BYPASS (N/A Abdomen) RIGHT FEMORAL ENDARTERECTOMY (Right Groin) abdominal AORTIC ENDARTERECETOMY (N/A Abdomen)  Patient Location: ICU  Anesthesia Type:General  Level of Consciousness: sedated and Patient remains intubated per anesthesia plan  Airway & Oxygen Therapy: Patient remains intubated per anesthesia plan and Patient placed on Ventilator (see vital sign flow sheet for setting)  Post-op Assessment: Report given to RN and Post -op Vital signs reviewed and stable  Post vital signs: Reviewed and stable  Last Vitals:  Vitals Value Taken Time  BP    Temp 36.9 C 04/18/20 1550  Pulse    Resp    SpO2      Last Pain:  Vitals:   04/18/20 1550  TempSrc: Axillary  PainSc:       Patients Stated Pain Goal: 3 (63/89/37 3428)  Complications: No complications documented.

## 2020-04-18 NOTE — Consult Note (Signed)
NAME:  Tonya Hughes, MRN:  [id number], DOB:  1950/12/01, LOS: 0 ADMISSION DATE:  04/18/2020, CONSULTATION DATE: 04/18/2020 REFERRING MD: Servando Snare, MD, CHIEF COMPLAINT: Shock, postop aortobifem.  Brief History   This is a 69 year old female with significant coronary disease and peripheral vascular disease.  Also has chronic systolic heart failure.  Patient was taken to the operating room today for an elective aortobifemoral bypass.  During operation patient had significant blood loss and decision was made for transfer to the intensive care unit as she received product, fluid resuscitation with minimal urine output.  Patient was left intubated and transferred to the ICU.  Pulmonary was consulted for recommendations of management.  History of present illness   69 year old female past medical history, chronic systolic heart failure, DVT, gastritis, GERD, hypercholesterolemia, hypertension, tobacco abuse, diabetes.  Patient was found to have aortoiliac occlusive disease and bilateral lower extremity rest pain.  She had mildly depressed ABIs that were monophasic.  Decision was made to take patient for elective aortobifemoral bypass by Dr. Servando Snare.  Patient postoperatively was left intubated as she received significant blood product transfusion as well as crystalloid.  She received approximately 3 L of resuscitation.  Patient was hypotensive postoperatively and transferred to the intensive care unit intubated on mechanical life support with phenylephrine and norepinephrine vasopressor infusions.  Patient was sedated on propofol.  Patient was evaluated in the intensive care unit.  Upon arrival to the ICU.  Stable.  Sedate RASS of -5 on propofol.  Additionally receiving continuous norepinephrine and phenylephrine.  She had her already started to make some urine in [place].  Past Medical History   Past Medical History:  Diagnosis Date  . Arthritis    HANDS  . Back pain   . Congestive heart failure  (CHF) ([place])   . Depressive disorder   . Diabetes mellitus without complication ([place])    new, A1c 6.9% 04/2020  . DVT (deep venous thrombosis) ([place])    Right LE s/p back surgery stop date lovenox 09/05/17  . Dyspnea   . Gastritis   . GERD (gastroesophageal reflux disease)   . Hypercholesteremia   . Hypertension   . Pneumonia      Significant Hospital Events   04/18/2020: ICU admission  Consults:  Vascular surgery Pulmonary critical care  Procedures:  6/172021: Elective aortobifemoral bypass  Significant Diagnostic Tests:  04/15/2020: Nuclear medicine cardiac imaging, LVEF 44%  Micro Data:   Antimicrobials:   Interim history/subjective:  Please see HPI above  Objective   Blood pressure (!) 165/57, pulse 65, temperature 98.4 F (36.9 C), temperature source Axillary, resp. rate 10, height 5' 3"  (1.6 m), weight 79.8 kg, SpO2 100 %.        Intake/Output Summary (Last 24 hours) at 04/18/2020 1600 Last data filed at 04/18/2020 1537 Gross per 24 hour  Intake 7720 ml  Output 2060 ml  Net 5660 ml   Filed Weights   04/18/20 0749  Weight: 79.8 kg    Examination: General: Elderly female, intubated mechanical life support HENT: NCAT, sedate, pupils reactive Lungs: Bilateral mechanically ventilated breath sounds no crackles no wheeze Cardiovascular: Regular rate rhythm, S1-S2 Abdomen: Obese pannus, soft, lower abdominal and groin bandage postop CDI Extremities: No significant edema, no mottling Neuro: Alert oriented x3 following commands  Resolved Hospital Problem list     Assessment & Plan:   Patient requiring mechanical ventilation perioperatively -Adult mechanical ventilator protocol -Wean FiO2 and PEEP as tolerated -Medically optimize heart prior to liberation from  ventilator. -PAD guidelines for sedation, currently on propofol  Acute blood loss anemia, postop blood loss -Related to surgery -Product resuscitation has occurred. -Trend H&H -No obvious sign of  bleeding at this time.  Hypotension Chronic systolic heart failure Presumed hypotension from hypovolemia, postop No clinical indication of cardiogenic shock at this time -Attempt to maintain euvolemia -Additional fluid resuscitation -We will reassess -Wean norepinephrine to maintain mean arterial pressure greater than 65 mmHg. -Can restart goal-directed heart failure regimen once off vasopressors.  Oliguria -Follow urine output -Repeat BMP  Smoker  Probably COPD  - scheduled nebs   Best practice:  Diet: N.p.o. Pain/Anxiety/Delirium protocol (if indicated): propofol  VAP protocol (if indicated): yes DVT prophylaxis: heparn GI prophylaxis: H2b Glucose control: n/a Mobility: BR Code Status: FULL Family Communication: per primary  Disposition:   Labs   CBC: Recent Labs  Lab 04/15/20 1505  WBC 11.2*  HGB 12.5  HCT 39.0  MCV 90.9  PLT 932    Basic Metabolic Panel: Recent Labs  Lab 04/15/20 1505  NA 134*  K 4.0  CL 96*  CO2 28  GLUCOSE 209*  BUN 15  CREATININE 0.89  CALCIUM 9.1   GFR: Estimated Creatinine Clearance: 61.4 mL/min (by C-G formula based on SCr of 0.89 mg/dL). Recent Labs  Lab 04/15/20 1505  WBC 11.2*    Liver Function Tests: Recent Labs  Lab 04/15/20 1505  AST 16  ALT 15  ALKPHOS 47  BILITOT 0.5  PROT 7.6  ALBUMIN 3.8   No results for input(s): LIPASE, AMYLASE in the last 168 hours. No results for input(s): AMMONIA in the last 168 hours.  ABG    Component Value Date/Time   TCO2 31 11/13/2019 0803     Coagulation Profile: Recent Labs  Lab 04/15/20 1505  INR 1.1    Cardiac Enzymes: No results for input(s): CKTOTAL, CKMB, CKMBINDEX, TROPONINI in the last 168 hours.  HbA1C: Hgb A1c MFr Bld  Date/Time Value Ref Range Status  04/16/2020 01:00 PM 6.9 (H) 4.8 - 5.6 % Final    Comment:    (NOTE) Pre diabetes:          5.7%-6.4%  Diabetes:              >6.4%  Glycemic control for   <7.0% adults with diabetes      CBG: Recent Labs  Lab 04/18/20 0751  GLUCAP 149*    Review of Systems:    Unable to obtain do to critical illness   Past Medical History  She,  has a past medical history of Arthritis, Back pain, Congestive heart failure (CHF) ([place]), Depressive disorder, Diabetes mellitus without complication ([place]), DVT (deep venous thrombosis) ([place]), Dyspnea, Gastritis, GERD (gastroesophageal reflux disease), Hypercholesteremia, Hypertension, and Pneumonia.   Surgical History    Past Surgical History:  Procedure Laterality Date  . ABDOMINAL AORTOGRAM W/LOWER EXTREMITY Bilateral 11/13/2019   Procedure: ABDOMINAL AORTOGRAM W/LOWER EXTREMITY;  Surgeon: Waynetta Sandy, MD;  Location: [facility] CV LAB;  Service: Cardiovascular;  Laterality: Bilateral;  . ABDOMINAL HYSTERECTOMY    . APPENDECTOMY    . BACK SURGERY    . CERVICAL SPINE SURGERY    . CHOLECYSTECTOMY    . DIAGNOSTIC LAPAROSCOPY     lap. chole  . LUMBAR SPINE SURGERY       Social History   reports that she has been smoking cigarettes. She has a 40.00 pack-year smoking history. She has never used smokeless tobacco. She reports previous alcohol use. She reports that  she does not use drugs.   Family History   Her family history is not on file.   Allergies Allergies  Allergen Reactions  . Zanaflex [Tizanidine Hcl] Hives and Swelling  . Penicillins Itching and Rash    Did it involve swelling of the face/tongue/throat, SOB, or low BP? No Did it involve sudden or severe rash/hives, skin peeling, or any reaction on the inside of your mouth or nose? Yes Did you need to seek medical attention at a hospital or doctor's office? Yes When did it last happen?40 + years If all above answers are "NO", may proceed with cephalosporin use.      Home Medications  Prior to Admission medications   Medication Sig Start Date End Date Taking? Authorizing Provider  aspirin EC 81 MG tablet Take 81 mg by mouth daily.   Yes  [provider]  atorvastatin (LIPITOR) 40 MG tablet Take 1 tablet (40 mg total) by mouth daily. 03/25/20 06/23/20 Yes Herminio Commons, MD  carvedilol (COREG) 3.125 MG tablet TAKE ONE TABLET BY MOUTH TWICE DAILY Patient taking differently: Take 3.125 mg by mouth 2 (two) times daily.  09/21/19  Yes Herminio Commons, MD  fexofenadine (ALLEGRA) 180 MG tablet Take 180 mg by mouth daily.   Yes [provider]  furosemide (LASIX) 40 MG tablet Take 40 mg by mouth daily.   Yes [provider]  lisinopril (PRINIVIL,ZESTRIL) 5 MG tablet Take 5 mg by mouth daily.   Yes [provider]  PARoxetine (PAXIL) 20 MG tablet Take 20 mg by mouth daily.   Yes [provider]  potassium chloride SA (K-DUR,KLOR-CON) 20 MEQ tablet TAKE ONE (1) TABLET EACH DAY Patient taking differently: Take 20 mEq by mouth daily.  01/03/19  Yes Herminio Commons, MD    This patient is critically ill with multiple organ system failure; which, requires frequent high complexity decision making, assessment, support, evaluation, and titration of therapies. This was completed through the application of advanced monitoring technologies and extensive interpretation of multiple databases. During this encounter critical care time was devoted to patient care services described in this note for 32 minutes.  Garner Nash, DO [facility] 04/18/2020 4:51 PM

## 2020-04-18 NOTE — H&P (Signed)
   History and Physical Update  The patient was interviewed and re-examined.  The patient's previous History and Physical has been reviewed and is unchanged from recent office visit. She has been evaluated by cardiology and thought to be low risk. No wound on her feet. We have discussed the risks, benefits and alternatives and she agrees to proceed.   Tonya Hughes C. Donzetta Matters, MD [facility] Office: [phone] Pager: [phone]   04/18/2020, 8:15 AM

## 2020-04-18 NOTE — Anesthesia Procedure Notes (Signed)
Central Venous Catheter Insertion Performed by: Catalina Gravel, MD, anesthesiologist Start/End6/17/2021 8:50 AM, 04/18/2020 9:00 AM Preanesthetic checklist: patient identified, IV checked, site marked, risks and benefits discussed, surgical consent, monitors and equipment checked, pre-op evaluation, timeout performed and anesthesia consent Position: Trendelenburg Lidocaine 1% used for infiltration and patient sedated Hand hygiene performed , maximum sterile barriers used  and Seldinger technique used Catheter size: 8 Fr Total catheter length 16. Central line was placed.Double lumen Procedure performed using ultrasound guided technique. Ultrasound Notes:anatomy identified, needle tip was noted to be adjacent to the nerve/plexus identified, no ultrasound evidence of intravascular and/or intraneural injection and image(s) printed for medical record Attempts: 1 Following insertion, line sutured, dressing applied and Biopatch. Post procedure assessment: blood return through all ports, free fluid flow and no air  Patient tolerated the procedure well with no immediate complications.

## 2020-04-18 NOTE — Anesthesia Procedure Notes (Signed)
Procedure Name: Intubation Date/Time: 04/18/2020 9:24 AM Performed by: Valda Favia, CRNA Pre-anesthesia Checklist: Patient identified, Emergency Drugs available, Suction available and Patient being monitored Patient Re-evaluated:Patient Re-evaluated prior to induction Oxygen Delivery Method: Circle System Utilized Preoxygenation: Pre-oxygenation with 100% oxygen Induction Type: IV induction Ventilation: Mask ventilation without difficulty Laryngoscope Size: Mac and 3 Grade View: Grade I Tube type: Oral Number of attempts: 1 Airway Equipment and Method: Stylet Placement Confirmation: ETT inserted through vocal cords under direct vision,  positive ETCO2 and breath sounds checked- equal and bilateral Secured at: 22 cm Tube secured with: Tape Dental Injury: Teeth and Oropharynx as per pre-operative assessment  Comments: Placed by D. [place], [place]

## 2020-04-18 NOTE — Anesthesia Postprocedure Evaluation (Signed)
Anesthesia Post Note  Patient: Tonya Hughes  Procedure(s) Performed: AORTA BIFEMORAL BYPASS (N/A Abdomen) RIGHT FEMORAL ENDARTERECTOMY (Right Groin) abdominal AORTIC ENDARTERECETOMY (N/A Abdomen)     Patient location during evaluation: SICU Anesthesia Type: General Level of consciousness: sedated Pain management: pain level controlled Vital Signs Assessment: post-procedure vital signs reviewed and stable Respiratory status: patient remains intubated per anesthesia plan Cardiovascular status: stable Postop Assessment: no apparent nausea or vomiting Anesthetic complications: no   No complications documented.  Last Vitals:  Vitals:   04/18/20 1958 04/18/20 2011  BP:    Pulse:  66  Resp:  19  Temp: 36.6 C   SpO2:  100%    Last Pain:  Vitals:   04/18/20 1958  TempSrc: Oral  PainSc:                  Catalina Gravel

## 2020-04-18 NOTE — Anesthesia Procedure Notes (Addendum)
Arterial Line Insertion Start/End6/17/2021 9:00 AM, 04/18/2020 9:05 AM Performed by: Catalina Gravel, MD, Valda Favia, CRNA, anesthesiologist  Patient location: Pre-op. Preanesthetic checklist: patient identified, IV checked, site marked, risks and benefits discussed, surgical consent, monitors and equipment checked, pre-op evaluation, timeout performed and anesthesia consent Lidocaine 1% used for infiltration Left, radial was placed Catheter size: 20 Fr Hand hygiene performed  and maximum sterile barriers used   Attempts: 1 Procedure performed using ultrasound guided technique. Ultrasound Notes:anatomy identified, needle tip was noted to be adjacent to the nerve/plexus identified, no ultrasound evidence of intravascular and/or intraneural injection and image(s) printed for medical record Following insertion, dressing applied and Biopatch. Post procedure assessment: normal and unchanged  Patient tolerated the procedure well with no immediate complications.

## 2020-04-18 NOTE — Op Note (Signed)
Patient name: Tonya Hughes MRN: [id number] DOB: 18-Mar-1951 Sex: female  04/18/2020 Pre-operative Diagnosis: Critical bilateral lower extremity ischemia with rest pain Post-operative diagnosis:  Same Surgeon:  Eda Paschal. Donzetta Matters, MD Assistants: Marty Heck, MD; Annamarie Major, MD; Paulo Fruit, [place] Procedure Performed: 1.  Lysis of abdominal adhesions greater than 30 minutes 2.  Aortic endarterectomy 3.  Right common femoral endarterectomy 4.  Aortobifemoral bypass with 14 x 7 mm Hemashield 5.  Ligation of left renal vein  Indications: 69 year old female with bilateral lower extremity rest pain has been considered to be multifactorial but she has significant aortoiliac occlusive disease with heavy calcification.  She has undergone angiography did not really have endovascular options we have considered axillobifemoral bypass versus aortobifemoral bypass she is undergoing cardiac clearance we will proceed with aortobifemoral bypass.  Findings: Right common femoral artery was heavily calcified.  After endarterectomy we had good backbleeding from the SFA and profunda femoris arteries.  After bypass on the right there were good signals in both the profunda and the SFA.  The left common femoral artery had a high bifurcation under the inguinal ligament.  The SFA itself was heavily calcified.  We bypass to the common femoral artery with a toe on the profunda and a completion there was very good flow in the profunda that was pulsatile weaker signal in the SFA.  The left renal vein was ligated for additional exposure to the bilateral renal arteries.  The only soft area for clamping was right at the renal arteries.  Aortic endarterectomy was performed for a place to sew.  Unfortunately this did cause a tear on the back wall that was repaired with pledgeted sutures.  We also required distal aortic endarterectomy to oversew our aorta.  There was minimal backbleeding from the IMA but it was a heavily  diseased artery therefore it was ligated and not reimplanted.  Patient remained intubated given underlying cardiopulmonary disease requiring ongoing resuscitation.   Procedure:  The patient was identified in the holding area and taken to the operating room where she was placed supine on the operating table and general anesthesia was induced.  She was sterilely prepped and draped in the bilateral groins and abdomen in the usual fashion antibiotics were minister timeout was called.  We began with concomitant bilateral vertical groin incisions.  Dr. Carlis Abbott was working in the right groin in the left groin.  We both dissected down to the common femoral artery.  The crossing vein was divided overlying the external iliac artery.  Bifurcation on the left was noted to be quite high we placed Vesseloops around the SFA and profunda as well as external leg arteries bilaterally.  We started the beginnings of tunneling for aortobifemoral bypass.  Midline incision was then made from the xiphoid to pubis.  We dissected down through subcutaneous fat to the fascial layer.  This was opened in the midline.  There were significant adhesions these were taken down from the patient's left to the right.  There were also significant adhesions between the colon and the small intestine these were taken down.  This took over 30 minutes.  We confirmed NG tube in place this was secured by anesthesia.  We inspected the entirety of our small bowel and colon all appeared healthy there were no lesions.  An Omni retractor was then placed.  The retroperitoneum was opened.  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  We dissected out the IMA.  This did not  have pulsatility in it.  We dissected higher identified the left renal vein.  We divided the inferior mesenteric vein.  Due to the patient's size we had difficulty exposing the entire aorta.  I then decided to clamp the renal vein in the midline in 2 places.  I then  divided it.  The place closest to the kidney I oversewed with running 5-0 Prolene suture in a mattress fashion.  Unfortunately the clamp did come off on the IVC side.  We are able to get a clamp around this and ultimately tied it off and doubly clipped it.  Dr. Trula Slade scrubbed in to help with this portion of the case.  Ultimately during that episode we lost 400 cc of blood patient had minimal hypotension that was treated by anesthesia promptly.  We then dissected out the renal arteries bilaterally.  There was only one soft area for clamping this was just below the renal arteries bilaterally.  We placed a right angle around this completely encircling the aorta at this level.  We dissected down to the bifurcation this was heavily diseased.  We dissected these out for several centimeters.  We then tunneled from the bilateral groins to the abdomen using aortic clamp and placed umbilical tapes.  Both of these were right on the external leg arteries and deep to the ureters we confirmed this anatomically.  This time the patient was fully heparinized with 8000 units of heparin.  We used a Harkins C-clamp just below the renal arteries to clamp the aorta.  We then clamped the bilateral common iliac arteries however given the calcification these did not clamp well.  We also used an aortic clamp to clamp the distal aorta this also did not clamp very well.  We then ligated the IMA at its origin.  It had minimal backbleeding but given his disease I thought it best not to attempt reimplantation.  I then ligated with 2-0 silk suture.  We then transected the aorta a few centimeters distal to our proximal clamp.  This was heavily calcified.  Distally I did excise 2 cm of the aorta.  We then performed distal endarterectomy and we then able to oversew the aorta distally with running 3-0 Prolene suture in a mattress fashion.  Proximally we also performed endarterectomy.  We did have difficulty getting complete control at the renal  arteries but ultimately we were able to do this.  We did have an injury to the back wall of the aorta during endarterectomy.  A 14 x 7 Dacron graft was then brought to the field and trimmed to size.  It was sewn end-to-end to the aorta with 3-0 Prolene suture.  Prior to completion we clamped our limbs bilaterally.  We then released our clamp.  Unfortunately were 2 areas of bleeding the previously noted injured area and one area in our suture anastomosis.  These were both oversewn with 4-0 Prolene pledgeted sutures.  Ultimately we did have good hemostasis and we removed our Harkins C-clamp.  We confirmed flow in our bilateral renal arteries with Doppler at this time.  Patient was also given additional 5000 use of heparin at this time.  We then tunneled our bilateral limbs to the groins.  We started with the right side which was heavily calcified of the common femoral artery bifurcation.  We clamped her outflow followed by her inflow.  We opened from the SFA longitudinally onto the common femoral artery.  We then performed endarterectomy of the common femoral artery up  into the external iliac artery we did establish antegrade bleeding in the external.  We also had good backbleeding from both the profunda and the SFA.  The graft was pulled straight and trimmed to size and sewn end-to-side with 5-0 Prolene suture.  Prior to completion we allowed flushing all directions.  Upon completion we notified anesthesia then released our clamps.  We did have good signals in the SFA and profunda distally.  We did have to reclamped for some hypotension which was transient.  Ultimately we removed our clamps and we had good hemostasis.  We packed this groin turned our attention the left groin.  Bifurcation on the left side was high under the inguinal ligament.  Given that the come femoral artery was soft with the SFA was heavily calcified I elected that I would need to bypass to the common femoral artery in case the patient ever  needed SFA intervention I therefore elected to bypass up under the inguinal ligament to the common femoral artery.  I manipulated the Omni retractor and placed the renal vein retractor on the inguinal ligament to retract this cephalad.  We then clamped the SFA and profunda followed by the external iliac arteries.  We opened longitudinally from the common femoral to the profunda.  We did have backbleeding and antegrade bleeding.  We irrigated with heparinized saline.  I did not attempt endarterectomy given how high up under the inguinal ligament our arteriotomy was.  We pulled our graft return to the size and sewed it in place with 5-0 Prolene suture in end-to-side fashion.  Prior to completion we allowed flushing all directions.  Upon completion we did have one repair stitch placed in the toe.  We notified anesthesia prior to releasing our clamps.  Upon release in the class patient did tolerate fine.  We did really strong pulsatility in our profunda femoris artery and good signal there.  We had a weaker signal in her SFA.  We obtain hemostasis in this wound and irrigated.  We turned our attention back to the abdomen.  There was 1 area near the left renal vein that we did ligate with 4-0 Prolene suture.  We obtain hemostasis in the abdomen 50 mg of protamine was administered.  We inspected our bowel running the entirety of the small bowel and also inspected the colon these all appeared healthy.  We again confirmed placement of the NG tube.  We closed the retroperitoneum with running 2-0 Vicryl suture.  We returned all the abdominal contents into place.  We closed the midline fascia with 0 PDS suture from either side tied in the middle.  We closed our groins with running 2-0 Vicryl sutures followed by interrupted 3-0 Vicryl sutures.  Staples were placed to both the groin and the abdomen skin.  Plan was for patient to remain intubated and transferred to the ICU.  All counts were correct at completion.  EBL: 1800  cc.  She was transfused 550 cc of Cell Saver and 3 units of packed red blood cells.   Pericles Carmicheal C. Donzetta Matters, MD [facility] Office: [phone] Pager: [phone]

## 2020-04-19 ENCOUNTER — Encounter (HOSPITAL_COMMUNITY): Payer: Self-pay | Admitting: Vascular Surgery

## 2020-04-19 ENCOUNTER — Encounter (HOSPITAL_COMMUNITY)
Admission: AD | Disposition: A | Discharge status: Skilled Nursing Facility | Payer: Self-pay | Source: Home / Self Care | Attending: Vascular Surgery

## 2020-04-19 ENCOUNTER — Inpatient Hospital Stay (HOSPITAL_COMMUNITY): Payer: Medicare Other

## 2020-04-19 ENCOUNTER — Telehealth: Payer: Self-pay | Admitting: *Deleted

## 2020-04-19 DIAGNOSIS — R578 Other shock: Secondary | ICD-10-CM

## 2020-04-19 HISTORY — PX: COLONOSCOPY: SHX5424

## 2020-04-19 LAB — BPAM FFP
Blood Product Expiration Date: 202106202359
Blood Product Expiration Date: 202106202359
ISSUE DATE / TIME: 202106171142
ISSUE DATE / TIME: 202106171142
Unit Type and Rh: 6200
Unit Type and Rh: 6200

## 2020-04-19 LAB — POCT I-STAT 7, (LYTES, BLD GAS, ICA,H+H)
Acid-base deficit: 7 mmol/L — ABNORMAL HIGH (ref 0.0–2.0)
Bicarbonate: 18.9 mmol/L — ABNORMAL LOW (ref 20.0–28.0)
Calcium, Ion: 0.97 mmol/L — ABNORMAL LOW (ref 1.15–1.40)
HCT: 24 % — ABNORMAL LOW (ref 36.0–46.0)
Hemoglobin: 8.2 g/dL — ABNORMAL LOW (ref 12.0–15.0)
O2 Saturation: 98 %
Patient temperature: 99.2
Potassium: 4.8 mmol/L (ref 3.5–5.1)
Sodium: 142 mmol/L (ref 135–145)
TCO2: 20 mmol/L — ABNORMAL LOW (ref 22–32)
pCO2 arterial: 38.6 mmHg (ref 32.0–48.0)
pH, Arterial: 7.3 — ABNORMAL LOW (ref 7.350–7.450)
pO2, Arterial: 115 mmHg — ABNORMAL HIGH (ref 83.0–108.0)

## 2020-04-19 LAB — PREPARE FRESH FROZEN PLASMA
Unit division: 0
Unit division: 0

## 2020-04-19 LAB — CBC WITH DIFFERENTIAL/PLATELET
Abs Immature Granulocytes: 0.13 10*3/uL — ABNORMAL HIGH (ref 0.00–0.07)
Basophils Absolute: 0.1 10*3/uL (ref 0.0–0.1)
Basophils Relative: 0 %
Eosinophils Absolute: 0.1 10*3/uL (ref 0.0–0.5)
Eosinophils Relative: 1 %
HCT: 27.7 % — ABNORMAL LOW (ref 36.0–46.0)
Hemoglobin: 8.9 g/dL — ABNORMAL LOW (ref 12.0–15.0)
Immature Granulocytes: 1 %
Lymphocytes Relative: 7 %
Lymphs Abs: 1.3 10*3/uL (ref 0.7–4.0)
MCH: 28.4 pg (ref 26.0–34.0)
MCHC: 32.1 g/dL (ref 30.0–36.0)
MCV: 88.5 fL (ref 80.0–100.0)
Monocytes Absolute: 1.1 10*3/uL — ABNORMAL HIGH (ref 0.1–1.0)
Monocytes Relative: 5 %
Neutro Abs: 17.1 10*3/uL — ABNORMAL HIGH (ref 1.7–7.7)
Neutrophils Relative %: 86 %
Platelets: 160 10*3/uL (ref 150–400)
RBC: 3.13 MIL/uL — ABNORMAL LOW (ref 3.87–5.11)
RDW: 17.5 % — ABNORMAL HIGH (ref 11.5–15.5)
WBC: 19.7 10*3/uL — ABNORMAL HIGH (ref 4.0–10.5)
nRBC: 0 % (ref 0.0–0.2)

## 2020-04-19 LAB — BASIC METABOLIC PANEL
Anion gap: 9 (ref 5–15)
BUN: 35 mg/dL — ABNORMAL HIGH (ref 8–23)
CO2: 19 mmol/L — ABNORMAL LOW (ref 22–32)
Calcium: 6.6 mg/dL — ABNORMAL LOW (ref 8.9–10.3)
Chloride: 111 mmol/L (ref 98–111)
Creatinine, Ser: 2.08 mg/dL — ABNORMAL HIGH (ref 0.44–1.00)
GFR calc Af Amer: 28 mL/min — ABNORMAL LOW (ref >60)
GFR calc non Af Amer: 24 mL/min — ABNORMAL LOW (ref >60)
Glucose, Bld: 177 mg/dL — ABNORMAL HIGH (ref 70–99)
Potassium: 4.9 mmol/L (ref 3.5–5.1)
Sodium: 139 mmol/L (ref 135–145)

## 2020-04-19 LAB — CBC
HCT: 27.9 % — ABNORMAL LOW (ref 36.0–46.0)
Hemoglobin: 9.1 g/dL — ABNORMAL LOW (ref 12.0–15.0)
MCH: 28.8 pg (ref 26.0–34.0)
MCHC: 32.6 g/dL (ref 30.0–36.0)
MCV: 88.3 fL (ref 80.0–100.0)
Platelets: 169 10*3/uL (ref 150–400)
RBC: 3.16 MIL/uL — ABNORMAL LOW (ref 3.87–5.11)
RDW: 17.3 % — ABNORMAL HIGH (ref 11.5–15.5)
WBC: 18.7 10*3/uL — ABNORMAL HIGH (ref 4.0–10.5)
nRBC: 0 % (ref 0.0–0.2)

## 2020-04-19 LAB — LACTIC ACID, PLASMA
Lactic Acid, Venous: 1.9 mmol/L (ref 0.5–1.9)
Lactic Acid, Venous: 2.6 mmol/L (ref 0.5–1.9)

## 2020-04-19 LAB — GLUCOSE, CAPILLARY
Glucose-Capillary: 141 mg/dL — ABNORMAL HIGH (ref 70–99)
Glucose-Capillary: 145 mg/dL — ABNORMAL HIGH (ref 70–99)
Glucose-Capillary: 158 mg/dL — ABNORMAL HIGH (ref 70–99)
Glucose-Capillary: 164 mg/dL — ABNORMAL HIGH (ref 70–99)
Glucose-Capillary: 166 mg/dL — ABNORMAL HIGH (ref 70–99)
Glucose-Capillary: 166 mg/dL — ABNORMAL HIGH (ref 70–99)
Glucose-Capillary: 166 mg/dL — ABNORMAL HIGH (ref 70–99)

## 2020-04-19 LAB — AMYLASE: Amylase: 115 U/L — ABNORMAL HIGH (ref 28–100)

## 2020-04-19 LAB — COMPREHENSIVE METABOLIC PANEL
ALT: 49 U/L — ABNORMAL HIGH (ref 0–44)
AST: 92 U/L — ABNORMAL HIGH (ref 15–41)
Albumin: 3.2 g/dL — ABNORMAL LOW (ref 3.5–5.0)
Alkaline Phosphatase: 32 U/L — ABNORMAL LOW (ref 38–126)
Anion gap: 9 (ref 5–15)
BUN: 27 mg/dL — ABNORMAL HIGH (ref 8–23)
CO2: 21 mmol/L — ABNORMAL LOW (ref 22–32)
Calcium: 6.8 mg/dL — ABNORMAL LOW (ref 8.9–10.3)
Chloride: 110 mmol/L (ref 98–111)
Creatinine, Ser: 1.68 mg/dL — ABNORMAL HIGH (ref 0.44–1.00)
GFR calc Af Amer: 36 mL/min — ABNORMAL LOW (ref >60)
GFR calc non Af Amer: 31 mL/min — ABNORMAL LOW (ref >60)
Glucose, Bld: 157 mg/dL — ABNORMAL HIGH (ref 70–99)
Potassium: 5.1 mmol/L (ref 3.5–5.1)
Sodium: 140 mmol/L (ref 135–145)
Total Bilirubin: 0.8 mg/dL (ref 0.3–1.2)
Total Protein: 4.5 g/dL — ABNORMAL LOW (ref 6.5–8.1)

## 2020-04-19 LAB — PHOSPHORUS: Phosphorus: 5.6 mg/dL — ABNORMAL HIGH (ref 2.5–4.6)

## 2020-04-19 LAB — MAGNESIUM: Magnesium: 1.4 mg/dL — ABNORMAL LOW (ref 1.7–2.4)

## 2020-04-19 LAB — OCCULT BLOOD X 1 CARD TO LAB, STOOL: Fecal Occult Bld: POSITIVE — AB

## 2020-04-19 SURGERY — COLONOSCOPY
Anesthesia: Moderate Sedation

## 2020-04-19 MED ORDER — ALBUMIN HUMAN 5 % IV SOLN
25.0000 g | Freq: Once | INTRAVENOUS | Status: AC
  Administered 2020-04-19: 25 g via INTRAVENOUS
  Filled 2020-04-19: qty 500

## 2020-04-19 MED ORDER — SODIUM CHLORIDE 0.9% FLUSH: 10.0000 mL | INTRAVENOUS | Status: DC | PRN

## 2020-04-19 MED ORDER — MIDAZOLAM HCL 5 MG/5ML IJ SOLN
INTRAMUSCULAR | Status: DC | PRN
  Administered 2020-04-19: 1 mg via INTRAVENOUS
  Administered 2020-04-19: 2 mg via INTRAVENOUS

## 2020-04-19 MED ORDER — CIPROFLOXACIN IN D5W 400 MG/200ML IV SOLN
400.0000 mg | INTRAVENOUS | Status: DC
  Administered 2020-04-20 – 2020-04-22 (×3): 400 mg via INTRAVENOUS
  Filled 2020-04-19 (×4): qty 200

## 2020-04-19 MED ORDER — METRONIDAZOLE IN NACL 5-0.79 MG/ML-% IV SOLN
500.0000 mg | Freq: Three times a day (TID) | INTRAVENOUS | Status: DC
  Administered 2020-04-19 – 2020-04-26 (×20): 500 mg via INTRAVENOUS
  Filled 2020-04-19 (×19): qty 100

## 2020-04-19 MED ORDER — SODIUM CHLORIDE 0.9% FLUSH
10.0000 mL | Freq: Two times a day (BID) | INTRAVENOUS | Status: DC
  Administered 2020-04-19 – 2020-04-21 (×4): 10 mL
  Administered 2020-04-21: 20 mL
  Administered 2020-04-22 – 2020-04-29 (×9): 10 mL

## 2020-04-19 MED ORDER — DIPHENHYDRAMINE HCL 50 MG/ML IJ SOLN
INTRAMUSCULAR | Status: AC
  Filled 2020-04-19: qty 1

## 2020-04-19 MED ORDER — FUROSEMIDE 10 MG/ML IJ SOLN
40.0000 mg | Freq: Once | INTRAMUSCULAR | Status: AC
  Administered 2020-04-19: 40 mg via INTRAVENOUS
  Filled 2020-04-19: qty 4

## 2020-04-19 MED ORDER — MIDAZOLAM HCL (PF) 5 MG/ML IJ SOLN
INTRAMUSCULAR | Status: AC
  Filled 2020-04-19: qty 3

## 2020-04-19 MED ORDER — METRONIDAZOLE IN NACL 5-0.79 MG/ML-% IV SOLN: 500.0000 mg | Freq: Four times a day (QID) | INTRAVENOUS | Status: DC

## 2020-04-19 MED ORDER — ALBUTEROL SULFATE (2.5 MG/3ML) 0.083% IN NEBU
2.5000 mg | INHALATION_SOLUTION | Freq: Four times a day (QID) | RESPIRATORY_TRACT | Status: DC
  Administered 2020-04-19 – 2020-04-21 (×9): 2.5 mg via RESPIRATORY_TRACT
  Filled 2020-04-19 (×9): qty 3

## 2020-04-19 MED ORDER — ACETAMINOPHEN 160 MG/5ML PO SOLN: 650.0000 mg | Freq: Four times a day (QID) | ORAL | Status: DC | PRN

## 2020-04-19 MED ORDER — FENTANYL CITRATE (PF) 100 MCG/2ML IJ SOLN
INTRAMUSCULAR | Status: AC
  Filled 2020-04-19: qty 4

## 2020-04-19 MED ORDER — CIPROFLOXACIN IN D5W 400 MG/200ML IV SOLN
400.0000 mg | Freq: Two times a day (BID) | INTRAVENOUS | Status: DC
  Administered 2020-04-19: 400 mg via INTRAVENOUS
  Filled 2020-04-19 (×2): qty 200

## 2020-04-19 NOTE — Consult Note (Signed)
Referring Provider:   Dr. Servando Snare Primary Care Physician:  Monico Blitz, MD Primary Gastroenterologist: None (unassigned)  Reason for Consultation: Concern of colonic ischemia  HPI: Tonya Hughes is a 69 y.o. female 1 day postop from an aortobifem procedure, in a patient with severe vascular disease.  The history is obtained from the referring physician, who notes that the patient has been having nonbloody but Hemoccult positive diarrhea, a rising lactate level (following transient improvement), an ongoing requirement for pressors, and leukocytosis.  There is a concern that she may have colonic ischemia.  The patient's husband, with whom I spoke on the phone, indicates that she has not previously had a colonoscopy, to his knowledge.  Today, the patient has had a drop in serum bicarbonate level, to a current level of 19, and her lactate level has risen from 1.9 to 2.6.  The patient's white count remains elevated at 19,700.  This morning, the patient had an arterial pH of 7.3 on her blood gas.     Past Medical History:  Diagnosis Date  . Arthritis    HANDS  . Back pain   . Congestive heart failure (CHF) ([place])   . Depressive disorder   . Diabetes mellitus without complication (JAARS)    new, A1c 6.9% 04/2020  . DVT (deep venous thrombosis) ([place])    Right LE s/p back surgery stop date lovenox 09/05/17  . Dyspnea   . Gastritis   . GERD (gastroesophageal reflux disease)   . Hypercholesteremia   . Hypertension   . Pneumonia     Past Surgical History:  Procedure Laterality Date  . ABDOMINAL AORTOGRAM W/LOWER EXTREMITY Bilateral 11/13/2019   Procedure: ABDOMINAL AORTOGRAM W/LOWER EXTREMITY;  Surgeon: Waynetta Sandy, MD;  Location: [facility];  Service: Cardiovascular;  Laterality: Bilateral;  . ABDOMINAL HYSTERECTOMY    . AORTA - BILATERAL FEMORAL ARTERY BYPASS GRAFT N/A 04/18/2020   Procedure: AORTA BIFEMORAL BYPASS;  Surgeon: Waynetta Sandy, MD;   Location: [facility] OR;  Service: Vascular;  Laterality: N/A;  . AORTIC ENDARTERECETOMY N/A 04/18/2020   Procedure: abdominal AORTIC ENDARTERECETOMY;  Surgeon: Waynetta Sandy, MD;  Location: [facility];  Service: Vascular;  Laterality: N/A;  . APPENDECTOMY    . BACK SURGERY    . CERVICAL SPINE SURGERY    . CHOLECYSTECTOMY    . DIAGNOSTIC LAPAROSCOPY     lap. chole  . ENDARTERECTOMY FEMORAL Right 04/18/2020   Procedure: RIGHT FEMORAL ENDARTERECTOMY;  Surgeon: Waynetta Sandy, MD;  Location: [facility];  Service: Vascular;  Laterality: Right;  . LUMBAR SPINE SURGERY      Prior to Admission medications   Medication Sig Start Date End Date Taking? Authorizing Provider  aspirin EC 81 MG tablet Take 81 mg by mouth daily.   Yes [provider]  atorvastatin (LIPITOR) 40 MG tablet Take 1 tablet (40 mg total) by mouth daily. 03/25/20 06/23/20 Yes Herminio Commons, MD  carvedilol (COREG) 3.125 MG tablet TAKE ONE TABLET BY MOUTH TWICE DAILY Patient taking differently: Take 3.125 mg by mouth 2 (two) times daily.  09/21/19  Yes Herminio Commons, MD  fexofenadine (ALLEGRA) 180 MG tablet Take 180 mg by mouth daily.   Yes [provider]  furosemide (LASIX) 40 MG tablet Take 40 mg by mouth daily.   Yes [provider]  lisinopril (PRINIVIL,ZESTRIL) 5 MG tablet Take 5 mg by mouth daily.   Yes [provider]  PARoxetine (PAXIL) 20 MG tablet Take 20 mg  by mouth daily.   Yes [provider]  potassium chloride SA (K-DUR,KLOR-CON) 20 MEQ tablet TAKE ONE (1) TABLET EACH DAY Patient taking differently: Take 20 mEq by mouth daily.  01/03/19  Yes Herminio Commons, MD    Current Facility-Administered Medications  Medication Dose Route Frequency Provider Last Rate Last Admin  . 0.9 %  sodium chloride infusion (Manually program via Guardrails IV Fluids)   Intravenous Once Baglia, Corrina, PA-C      . 0.9 %  sodium chloride infusion  500 mL Intravenous Once  PRN Baglia, Corrina, PA-C      . 0.9 %  sodium chloride infusion   Intravenous Continuous Icard, Bradley L, DO   Stopped at 04/19/20 1740  . 0.9 %  sodium chloride infusion  250 mL Intravenous Continuous Icard, Bradley L, DO   Held at 04/18/20 1739  . acetaminophen (TYLENOL) 160 MG/5ML solution 650 mg  650 mg Oral Q6H PRN Waynetta Sandy, MD      . acetaminophen (TYLENOL) tablet 325-650 mg  325-650 mg Oral Q4H PRN Baglia, Corrina, PA-C   325 mg at 04/19/20 2015   Or  . acetaminophen (TYLENOL) suppository 325-650 mg  325-650 mg Rectal Q4H PRN Baglia, Corrina, PA-C      . albuterol (PROVENTIL) (2.5 MG/3ML) 0.083% nebulizer solution 2.5 mg  2.5 mg Nebulization Q6H Waynetta Sandy, MD   2.5 mg at 04/19/20 1539  . alum & mag hydroxide-simeth (MAALOX/MYLANTA) 200-200-20 MG/5ML suspension 15-30 mL  15-30 mL Oral Q2H PRN Baglia, Corrina, PA-C      . arformoterol (BROVANA) nebulizer solution 15 mcg  15 mcg Nebulization BID Icard, Bradley L, DO   15 mcg at 04/19/20 0824  . bisacodyl (DULCOLAX) suppository 10 mg  10 mg Rectal Daily PRN Baglia, Corrina, PA-C      . budesonide (PULMICORT) nebulizer solution 0.25 mg  0.25 mg Nebulization BID Icard, Bradley L, DO   0.25 mg at 04/19/20 0824  . chlorhexidine gluconate (MEDLINE KIT) (PERIDEX) 0.12 % solution 15 mL  15 mL Mouth Rinse BID Icard, Bradley L, DO   15 mL at 04/19/20 1955  . Chlorhexidine Gluconate Cloth 2 % PADS 6 each  6 each Topical Daily Icard, Bradley L, DO   6 each at 04/19/20 1000  . ciprofloxacin (CIPRO) IVPB 400 mg  400 mg Intravenous Q12H Waynetta Sandy, MD   Stopped at 04/19/20 1733  . dexmedetomidine (PRECEDEX) 400 MCG/100ML (4 mcg/mL) infusion  0.4-1.2 mcg/kg/hr Intravenous Titrated Icard, Bradley L, DO 19.95 mL/hr at 04/19/20 1800 1 mcg/kg/hr at 04/19/20 1800  . docusate sodium (COLACE) capsule 100 mg  100 mg Oral BID PRN Icard, Bradley L, DO      . famotidine (PEPCID) IVPB 20 mg premix  20 mg Intravenous QHS  Icard, Bradley L, DO   Paused at 04/18/20 2245  . fentaNYL (SUBLIMAZE) injection 50 mcg  50 mcg Intravenous Q1H PRN Icard, Bradley L, DO   50 mcg at 04/19/20 0533  . guaiFENesin-dextromethorphan (ROBITUSSIN DM) 100-10 MG/5ML syrup 15 mL  15 mL Oral Q4H PRN Baglia, Corrina, PA-C      . heparin injection 5,000 Units  5,000 Units Subcutaneous Q8H Baglia, Corrina, PA-C   5,000 Units at 04/19/20 1442  . hydrALAZINE (APRESOLINE) injection 5 mg  5 mg Intravenous Q20 Min PRN Baglia, Corrina, PA-C      . insulin aspart (novoLOG) injection 0-15 Units  0-15 Units Subcutaneous Q4H Marty Heck, MD   3 Units at 04/19/20  1954  . labetalol (NORMODYNE) injection 10 mg  10 mg Intravenous Q10 min PRN Baglia, Corrina, PA-C      . MEDLINE mouth rinse  15 mL Mouth Rinse 10 times per day Icard, Bradley L, DO   15 mL at 04/19/20 1825  . metoprolol tartrate (LOPRESSOR) injection 2-5 mg  2-5 mg Intravenous Q2H PRN Baglia, Corrina, PA-C      . metroNIDAZOLE (FLAGYL) IVPB 500 mg  500 mg Intravenous Q8H Waynetta Sandy, MD 100 mL/hr at 04/19/20 1800 Rate Verify at 04/19/20 1800  . morphine 2 MG/ML injection 2-5 mg  2-5 mg Intravenous Q1H PRN Baglia, Corrina, PA-C   4 mg at 04/19/20 1930  . norepinephrine (LEVOPHED) 27m in 2572mpremix infusion  5-50 mcg/min Intravenous Titrated Icard, Bradley L, DO 30 mL/hr at 04/19/20 1800 8 mcg/min at 04/19/20 1800  . ondansetron (ZOFRAN) injection 4 mg  4 mg Intravenous Q6H PRN Baglia, Corrina, PA-C      . oxyCODONE (Oxy IR/ROXICODONE) immediate release tablet 5-10 mg  5-10 mg Oral Q4H PRN Baglia, Corrina, PA-C      . phenol (CHLORASEPTIC) mouth spray 1 spray  1 spray Mouth/Throat PRN Baglia, Corrina, PA-C      . polyethylene glycol (MIRALAX / GLYCOLAX) packet 17 g  17 g Oral Daily PRN Icard, Bradley L, DO      . potassium chloride SA (KLOR-CON) CR tablet 20-40 mEq  20-40 mEq Oral Daily PRN Baglia, Corrina, PA-C      . sodium chloride flush (NS) 0.9 % injection 10-40 mL   10-40 mL Intracatheter Q12H CaWaynetta SandyMD   10 mL at 04/19/20 0222  . sodium chloride flush (NS) 0.9 % injection 10-40 mL  10-40 mL Intracatheter PRN CaWaynetta SandyMD        Allergies as of 03/29/2020 - Review Complete 03/29/2020  Allergen Reaction Noted  . Zanaflex [tizanidine hcl] Hives and Swelling 12/22/2017  . Penicillins Itching and Rash 04/20/2013    History reviewed. No pertinent family history.  Social History   Socioeconomic History  . Marital status: Married    Spouse name: Not on file  . Number of children: Not on file  . Years of education: Not on file  . Highest education level: Not on file  Occupational History  . Not on file  Tobacco Use  . Smoking status: Current Every Day Smoker    Packs/day: 1.00    Years: 40.00    Pack years: 40.00    Types: Cigarettes  . Smokeless tobacco: Never Used  Vaping Use  . Vaping Use: Never used  Substance and Sexual Activity  . Alcohol use: Not Currently  . Drug use: Never  . Sexual activity: Not on file  Other Topics Concern  . Not on file  Social History Narrative  . Not on file   Social Determinants of Health   Financial Resource Strain:   . Difficulty of Paying Living Expenses:   Food Insecurity:   . Worried About RuCharity fundraisern the Last Year:   . RaArboriculturistn the Last Year:   Transportation Needs:   . LaFilm/video editorMedical):   . Marland Kitchenack of Transportation (Non-Medical):   Physical Activity:   . Days of Exercise per Week:   . Minutes of Exercise per Session:   Stress:   . Feeling of Stress :   Social Connections:   . Frequency of Communication with Friends and Family:   .  Frequency of Social Gatherings with Friends and Family:   . Attends Religious Services:   . Active Member of Clubs or Organizations:   . Attends Archivist Meetings:   Marland Kitchen Marital Status:   Intimate Partner Violence:   . Fear of Current or Ex-Partner:   . Emotionally Abused:    Marland Kitchen Physically Abused:   . Sexually Abused:     Review of Systems: not obtainable--patient on ventilator  Physical Exam: Vital signs in last 24 hours: Temp:  [96.9 F (36.1 C)-100.3 F (37.9 C)] 99.8 F (37.7 C) (06/18 1540) Pulse Rate:  [55-73] 66 (06/18 1800) Resp:  [11-26] 26 (06/18 1800) BP: (92-133)/(36-58) 121/49 (06/18 1800) SpO2:  [91 %-100 %] 95 % (06/18 1800) FiO2 (%):  [30 %-40 %] 30 % (06/18 1539) Weight:  [87.1 kg] 87.1 kg (06/18 0500) Last BM Date: 04/19/20  Patient not yet examined; please see pre-sedation assessment to be performed later this evening.  Intake/Output from previous day: 06/17 0701 - 06/18 0700 In: 11858.6 [I.V.:6835.8; Blood:2120; IV Piggyback:2902.9] Out: 1610 [Urine:1325; Emesis/NG output:150; Blood:1800] Intake/Output this shift: No intake/output data recorded.  Lab Results: Recent Labs    04/18/20 1648 04/18/20 1706 04/19/20 0421 04/19/20 0606 04/19/20 1549  WBC 23.2*  --  18.7*  --  19.7*  HGB 11.7*   < > 9.1* 8.2* 8.9*  HCT 35.9*   < > 27.9* 24.0* 27.7*  PLT 201  --  169  --  160   < > = values in this interval not displayed.   BMET Recent Labs    04/18/20 1648 04/18/20 1706 04/19/20 0421 04/19/20 0606 04/19/20 1549  NA 135   < > 140 142 139  K 5.1   < > 5.1 4.8 4.9  CL 104  --  110  --  111  CO2 22  --  21*  --  19*  GLUCOSE 229*  --  157*  --  177*  BUN 18  --  27*  --  35*  CREATININE 1.35*  --  1.68*  --  2.08*  CALCIUM 7.9*  --  6.8*  --  6.6*   < > = values in this interval not displayed.   LFT Recent Labs    04/19/20 0421  PROT 4.5*  ALBUMIN 3.2*  AST 92*  ALT 49*  ALKPHOS 32*  BILITOT 0.8   PT/INR Recent Labs    04/18/20 1648  LABPROT 17.3*  INR 1.5*    Studies/Results: DG Chest Port 1 View  Result Date: 04/19/2020 CLINICAL DATA:  Check endotracheal tube placement EXAM: PORTABLE CHEST 1 VIEW COMPARISON:  04/18/2020 FINDINGS: Endotracheal tube, gastric catheter and right jugular central line  are again seen and stable. Cardiac shadow is mildly enlarged stable. The lungs are well aerated bilaterally. Minimal bibasilar atelectasis is seen. No bony abnormality is noted. IMPRESSION: Mild bibasilar atelectasis. Tubes and lines as described. Electronically Signed   By: Inez Catalina M.D.   On: 04/19/2020 09:00   DG Chest Port 1 View  Result Date: 04/18/2020 CLINICAL DATA:  Ventilator dependent EXAM: PORTABLE CHEST 1 VIEW COMPARISON:  11/17/2017 FINDINGS: Cardiac shadow is mildly enlarged but stable. Aortic calcifications are seen. Endotracheal tube and gastric catheter are noted in satisfactory position. Right jugular central line is seen at the cavoatrial junction. No pneumothorax is noted. Mild vascular congestion is seen. Postsurgical changes are noted in the thoracolumbar spine. IMPRESSION: Mild vascular congestion. Tubes and lines as described. Electronically Signed   By: Elta Guadeloupe  Lukens M.D.   On: 04/18/2020 16:55    Impression: The patient is certainly at risk for colonic ischemia in view of her recent surgery, and her labs are concerning.  Plan: I agree with Dr. Donzetta Matters that it would be helpful to examine the colon and determine the presence or absence of colonic mucosal ischemia to help guide further management.  We could consider doing a CT scan but direct visualization of the colonic mucosa will give a more accurate assessment of the severity of any ischemic change, and by inference, tissue viability.  I have described the nature, purpose, and risks of colonoscopy with the patient's husband by telephone and he is agreeable to proceed.  He understands that, if the tissues are severely compromised, they may be more fragile, leading to an increased risk of perforation.  The procedure will be done unprepped (since the patient is having diarrhea) at the bedside, hopefully with need for little or no additional sedation since she is already on a ventilator.    LOS: 1 day   Youlanda Mighty Carley Glendenning   04/19/2020, 8:42 PM   Pager [phone] If no answer or after 5 PM call [phone]

## 2020-04-19 NOTE — Progress Notes (Signed)
Initial Nutrition Assessment  DOCUMENTATION CODES:   Not applicable  INTERVENTION:   If unable to extubate within next 24 hours, recommend initiate tube feeding via NG tube: Vital AF 1.2 at 55 ml/h (1320 ml per day) Pro-stat 30 ml BID  Provides 1784 kcal, 129 gm protein, 1071 ml free water daily  NUTRITION DIAGNOSIS:   Inadequate oral intake related to inability to eat as evidenced by NPO status.  GOAL:   Patient will meet greater than or equal to 90% of their needs  MONITOR:   Vent status, Labs, I & O's  REASON FOR ASSESSMENT:   Ventilator    ASSESSMENT:   69 yo female admitted with shock, postop aortobifemoral bypass surgery. PMH includes significant coronary disease, HF, PVD, HLD, HTN, GERD, DM.   Patient is currently intubated on ventilator support MV: 11.1 L/min Temp (24hrs), Avg:98.7 F (37.1 C), Min:96.9 F (36.1 C), Max:100.3 F (37.9 C)   Labs reviewed. BUN 27 (H), creat 1.68 (H), phos 5.6 (H), mag 1.4 (L), Hgb 8.2 (L) CBG: 141-158  Medications reviewed and include novolog, levophed.  FOBT + Patient failed SBT this morning. May retry this afternoon per RN. NG tube in place, but no plans to begin TF at this time due to potential extubation.   79.8 kg on admission, currently 87.1 kg  I/O +8.8 L  NUTRITION - FOCUSED PHYSICAL EXAM:    Most Recent Value  Orbital Region No depletion  Upper Arm Region No depletion  Thoracic and Lumbar Region No depletion  Buccal Region No depletion  Temple Region No depletion  Clavicle Bone Region No depletion  Clavicle and Acromion Bone Region No depletion  Scapular Bone Region No depletion  Dorsal Hand No depletion  Patellar Region No depletion  Anterior Thigh Region No depletion  Posterior Calf Region No depletion  Edema (RD Assessment) None  Hair Reviewed  Eyes Unable to assess  Mouth Unable to assess  Skin Reviewed  Nails Reviewed       Diet Order:   Diet Order            Diet NPO time  specified  Diet effective now                 EDUCATION NEEDS:   No education needs have been identified at this time  Skin:  Skin Assessment: Reviewed RN Assessment  Last BM:  6/18  Height:   Ht Readings from Last 1 Encounters:  04/18/20 5' 3"  (1.6 m)    Weight:   Wt Readings from Last 1 Encounters:  04/19/20 87.1 kg    Ideal Body Weight:  52.3 kg  BMI:  31.2 using admit weight  Estimated Nutritional Needs:   Kcal:  1715  Protein:  120-130 gm  Fluid:  1.8 L    Lucas Mallow, RD, LDN, CNSC Please refer to Amion for contact information.

## 2020-04-19 NOTE — Progress Notes (Signed)
eLink Physician-Brief Progress Note Patient Name: Tonya Hughes DOB: 05-28-1951 MRN: [id number]   Date of Service  04/19/2020  HPI/Events of Note  Patient is at very high risk for self extubation.  eICU Interventions  Bilateral soft wrist restraints ordered.        Kerry Kass Annabelle Rexroad 04/19/2020, 6:44 AM

## 2020-04-19 NOTE — Progress Notes (Signed)
Flexible sigmoidoscopy was well-tolerated.  It showed severe ischemic changes beginning in the distal rectum.  Please see dictated report with accompanying photographs for more details.  At this time, the tissue that I was able to observe did not look frankly gangrenous.  However, the the extent and severity of more proximal involvement cannot be ascertained from tonight's limited exam.  Recommendations:  1.  I will leave the Flexi-Seal tube out, so that the balloon does not contribute to pressure necrosis of the compromised rectal mucosa.  2.  Management was discussed with Dr. Donzetta Matters.  Since the tissue I was able to see on tonight's exam does not look frankly gangrenous, there is no definite mandate for operative intervention at this time.  Given the patient's poor underlying medical status, I agree with Dr. Claretha Cooper current plan for close observation rather than immediate surgery.  If the patient appears to be getting worse over time, surgery may become inevitable.  3.  If needed, a CT scan could provide further information about the extent, and to some degree the severity, of more proximal ischemic involvement.  4.  I have discussed the findings and plans with the patient's husband by telephone.  He was very appreciative for our efforts, and is happy to be called at any time.  5.  Per discussion with Dr. Donzetta Matters, there probably is no further role for our involvement in this patient's care, so we will sign off at this time, but will remain on standby in case we can be of further help.  Cleotis Nipper, M.D. Pager [phone] If no answer or after 5 PM call [phone]

## 2020-04-19 NOTE — Telephone Encounter (Signed)
Laurine Blazer, LPN  01/07/5693 3:70 AM EDT Back to Top    Left message to return call.

## 2020-04-19 NOTE — Progress Notes (Signed)
°  °  Labs reviewed.  Abdomen remains soft.  Lactate has cleared.  Continue antibiotics and will follow stool output and leukocytosis.  Vencent Hauschild C. Donzetta Matters, MD [facility] Office: [phone] Pager: [phone]

## 2020-04-19 NOTE — Progress Notes (Signed)
OT Cancellation Note  Patient Details Name: Tonya Hughes MRN: [id number] DOB: June 09, 1951   Cancelled Treatment:    Reason Eval/Treat Not Completed: Medical issues which prohibited therapy; pt remains intubated and weaning with RN stating hold until post extubation. Will follow up as able.  Lou Cal, OT Acute Rehabilitation Services Pager [phone] Office [phone]   Raymondo Band 04/19/2020, 9:02 AM

## 2020-04-19 NOTE — Progress Notes (Signed)
eLink Physician-Brief Progress Note Patient Name: Tonya Hughes DOB: [birth year] MRN: [id number]   Date of Service  04/19/2020  HPI/Events of Note  Frequent loose stools.  eICU Interventions  RN given a verbal order for Flexiseal insertion.        Kerry Kass Chaquana Nichols 04/19/2020, 4:02 AM

## 2020-04-19 NOTE — Progress Notes (Signed)
PT Cancellation Note  Patient Details Name: ZOUA CAPORASO MRN: [id number] DOB: 06/04/1951   Cancelled Treatment:    Reason Eval/Treat Not Completed: Patient not medically ready (pt remains intubated and weaning with RN stating hold until post extubation)   Amaryah Mallen B Reizy Dunlow 04/19/2020, 8:40 AM  Bayard Males, PT Acute Rehabilitation Services Pager: [phone] Office: [phone]

## 2020-04-19 NOTE — Op Note (Signed)
[facility] Patient Name: Tonya Hughes Procedure Date : 04/19/2020 MRN: [id number] Attending MD: Ronald Lobo , MD Date of Birth: Jun 08, 1951 CSN: [id number] Age: 69 Admit Type: Inpatient Procedure:                Colonoscopy Indications:              Diarrhea, post-operative assessment (heme positive,                            but non-bloody) following aorto-bifem surgery                            yesterday, now with rising lactate level and                            leukocytosis Providers:                Ronald Lobo, MD, Wynonia Sours, RN, Carlyn Reichert,                            RN, Laverda Sorenson, Technician Referring MD:              Medicines:                Midazolam 3 mg IV Complications:            No immediate complications. Estimated Blood Loss:     Estimated blood loss: none. Procedure:                Pre-Anesthesia Assessment:                           - Prior to the procedure, a History and Physical                            was performed, and patient medications and                            allergies were reviewed. The patient's tolerance of                            previous anesthesia was also reviewed. The risks                            and benefits of the procedure and the sedation                            options and risks were discussed with the patient.                            All questions were answered, and informed consent                            was obtained. Prior Anticoagulants: The patient has                            taken no previous anticoagulant  or antiplatelet                            agents except for aspirin. ASA Grade Assessment: IV                            - A patient with severe systemic disease that is a                            constant threat to life. After reviewing the risks                            and benefits, the patient was deemed in                            satisfactory condition to undergo  the procedure.                           After obtaining informed consent, the colonoscope                            was passed under direct vision. Throughout the                            procedure, the patient's blood pressure, pulse, and                            oxygen saturations were monitored continuously. The                            PCF-H190DL ([id number]) Olympus pediatric colonoscope                            was introduced through the anus and advanced to the                            the sigmoid colon. The colonoscopy was technically                            difficult and complex due to restricted mobility of                            the colon. The patient tolerated the procedure                            well. The quality of the bowel preparation                            (unprepped) was excellent, consistent with the                            history of diarrhea. Scope In: 9:58:25 PM Scope Out: 10:04:10 PM Total Procedure Duration: 0 hours 5 minutes 45 seconds  Findings:  The perianal and digital rectal examinations were normal.      Severely congested and inflamed mucosa was found in the proximal rectum,       in the mid rectum and in the distal sigmoid colon. There was complete       loss of vascularity, severe mucosal edema, overlying exudate, and a       violaceous coloration to the mucosa all suggestive of SEVERE ISCHEMIC       CHANGES with mucosal necrosis, but without frank gangrene. The distal       rectal mucosa had some erythema and exudate but nowhere near the       severity of changes seen more proximally.      There was a frankly necrotic odor present in association with this       patient's exam.      At approximately 25 cm of scope insertion, felt to correspond to the       distal sigmoid, there was considerable resistance to further passage of       the scope, perhaps due to severe edema with spasm and/or luminal       narrowing. I elected  not to aggressively attempt further advancement, so       as to try to avoid the risk of perforating the colonic wall.      I also elected not to obtain biopsies since it was felt that the       diagnosis was clear endoscopically, and because, by the time the results       would be available, they would not likely contribute to patient       management decisions at that point.      Retroflexion was not performed in the rectum due to the adjacent       severely compromised mucosa. Impression:               - SEVERE ischemic changes, without frank gangrene;                            see above discussion.                           - Incomplete exam: unable to get proximal to the                            distal sigmoid region, see above discussion. Since                            the majority of the colon was not visualized on                            this exam, the status of the more proximal sections                            of the colon is unknown.                           - No specimens collected. Recommendation:           - Close clinical observation by Vascular Surgery  team, with consideration of surgery with colectomy                            if the patient's condition deteriorates. Procedure Code(s):        --- Professional ---                           [phone], 76, Colonoscopy, flexible; diagnostic,                            including collection of specimen(s) by brushing or                            washing, when performed (separate procedure) Diagnosis Code(s):        --- Professional ---                           K63.89, Other specified diseases of intestine                           K62.89, Other specified diseases of anus and rectum                           K52.9, Noninfective gastroenteritis and colitis,                            unspecified                           R19.7, Diarrhea, unspecified CPT copyright 2019 American Medical  Association. All rights reserved. The codes documented in this report are preliminary and upon coder review may  be revised to meet current compliance requirements. Ronald Lobo, MD 04/19/2020 10:50:34 PM This report has been signed electronically. Number of Addenda: 0

## 2020-04-19 NOTE — Progress Notes (Signed)
   I was called about elevated lactic acid tonight with temperature of 103.4.  She has persistent Levophed requirement her abdomen is soft she does have good urine output but there is ongoing concern for colonic ischemia.  I have consulted gastroenterology who has graciously agreed to evaluate tonight with bedside scope.  I will follow-up results of scope patient will possibly need ongoing hydration versus return to OR.  I have discussed this with the patient's husband Carlos American and he demonstrates good understanding.  Servando Snare, MD

## 2020-04-19 NOTE — Telephone Encounter (Signed)
-----   Message from Arnoldo Lenis, MD sent at 04/17/2020  9:36 AM EDT ----- Normal heart function by echo   J BrancH MD

## 2020-04-19 NOTE — Progress Notes (Signed)
CRITICAL VALUE ALERT  Critical Value:  Lactic acid 2.6, temperature 103.4   Date & Time Notied:  04/19/20 1950  Provider Notified: Donzetta Matters  Orders Received/Actions taken: new orders received

## 2020-04-19 NOTE — Progress Notes (Addendum)
NAME:  Tonya Hughes, MRN:  [id number], DOB:  [birth year], LOS: 1 ADMISSION DATE:  04/18/2020, CONSULTATION DATE: 04/18/2020 REFERRING MD: Servando Snare, MD, CHIEF COMPLAINT: Shock, postop aortobifem.  Brief History   This is a 69 year old female with significant coronary disease and peripheral vascular disease.  Also has chronic systolic heart failure.  Patient was taken to the operating room today for an elective aortobifemoral bypass.  During operation patient had significant blood loss and decision was made for transfer to the intensive care unit as she received product, fluid resuscitation with minimal urine output.  Patient was left intubated and transferred to the ICU.  Pulmonary was consulted for recommendations of management.  History of present illness   69 year old female past medical history, chronic systolic heart failure, DVT, gastritis, GERD, hypercholesterolemia, hypertension, tobacco abuse, diabetes.  Patient was found to have aortoiliac occlusive disease and bilateral lower extremity rest pain.  She had mildly depressed ABIs that were monophasic.  Decision was made to take patient for elective aortobifemoral bypass by Dr. Servando Snare.  Patient postoperatively was left intubated as she received significant blood product transfusion as well as crystalloid.  She received approximately 3 L of resuscitation.  Patient was hypotensive postoperatively and transferred to the intensive care unit intubated on mechanical life support with phenylephrine and norepinephrine vasopressor infusions.  Patient was sedated on propofol.  Patient was evaluated in the intensive care unit.  Upon arrival to the ICU.  Stable.  Sedate RASS of -5 on propofol.  Additionally receiving continuous norepinephrine and phenylephrine.  She had her already started to make some urine in [place].  Past Medical History   Past Medical History:  Diagnosis Date  . Arthritis    HANDS  . Back pain   . Congestive heart failure  (CHF) ([place])   . Depressive disorder   . Diabetes mellitus without complication ([place])    new, A1c 6.9% 04/2020  . DVT (deep venous thrombosis) ([place])    Right LE s/p back surgery stop date lovenox 09/05/17  . Dyspnea   . Gastritis   . GERD (gastroesophageal reflux disease)   . Hypercholesteremia   . Hypertension   . Pneumonia      Significant Hospital Events   04/18/2020: ICU admission  Consults:  Vascular surgery Pulmonary critical care  Procedures:  6/172021: Elective aortobifemoral bypass  Significant Diagnostic Tests:  04/15/2020: Nuclear medicine cardiac imaging, LVEF 44%  Micro Data:    Antimicrobials:  Vancomycin x1, per-op  Interim history/subjective:  Seen lying in bed in no acute distress this a.m.  Nurse reports mild agitation overnight requiring restraints for safety.  RN also reports increased urine output after fluid bolus and albumin.  Objective   Blood pressure (!) 109/54, pulse 64, temperature 98.8 F (37.1 C), temperature source Oral, resp. rate 18, height 5' 3"  (1.6 m), weight 87.1 kg, SpO2 94 %. CVP:  [1 mmHg-42 mmHg] 10 mmHg  Vent Mode: PRVC FiO2 (%):  [30 %-50 %] 30 % Set Rate:  [12 bmp-18 bmp] 18 bmp Vt Set:  [420 mL] 420 mL PEEP:  [5 cmH20] 5 cmH20 Pressure Support:  [10 cmH20] 10 cmH20 Plateau Pressure:  [21 cmH20] 21 cmH20   Intake/Output Summary (Last 24 hours) at 04/19/2020 0803 Last data filed at 04/19/2020 0700 Gross per 24 hour  Intake 11858.64 ml  Output 3275 ml  Net 8583.64 ml   Filed Weights   04/18/20 0749 04/19/20 0500  Weight: 79.8 kg 87.1 kg    Examination: General:  Middle-aged female lying in bed in no acute distress HEENT: ETT, MM pink/moist, PERRL, sclera nonicteric Neuro: Sleepy but easily arousable to verbal stimuli, able to follow simple commands CV: s1s2 regular rate and rhythm, no murmur, rubs, or gallops,  PULM:  Clear to ascultation, no added breath sounds, no increased work of GI: soft, bowel sounds active in  all 4 quadrants, non-tender, non-distended Extremities: warm/dry, no edema  Skin: no rashes or lesions  Resolved Hospital Problem list     Assessment & Plan:   Patient requiring mechanical ventilation perioperatively - Patient postoperatively was left intubated as she received significant blood product transfusion as well as crystalloid.  She received approximately 3 L of resuscitation P: Continue ventilator support with lung protective strategies  Trial of SBT this a.m. if able to pass and mentation allows will extubate Wean PEEP and FiO2 for sats greater than 90%. Head of bed elevated 30 degrees. Plateau pressures less than 30 cm H20.  Follow intermittent chest x-ray and ABG.   Ensure adequate pulmonary hygiene  Follow cultures  VAP bundle in place  PAD protocol  Hemorrhagic shock -Estimated blood loss 1800 cc per operative report Chronic systolic heart failure -No clinical indication of cardiogenic shock at this time -Home medications include carvedilol, Lasix, lisinopril, and Paxil P: Remains on low-dose norepinephrine, wean as tolerated MAP goal greater than 65 Goal of a bulimia Close monitoring of volume status Resume home Can resume goal-directed heart failure regiment once of vasopressors  Acute blood loss anemia, postop blood loss -Related to surgery -Thus far during hospitalization patient has received 3 units of PRBC and 2 of FFP P: Hemoglobin remained stable at 9.1, preprocedure hemoglobin was 12.5 Trend CBC Monitor for recurrence of the bleeding Further transfusion per primary  Acute Kidney Injury  -in the setting of hypovolemic shock. Baseline creatinine 0.8-0.9, creatinine currently 1.68 P: Follow renal function  Trend Bmet Avoid nephrotoxins, ensure adequate renal perfusion  Has received fluid resuscitation Close monitoring of urine output If creatinine continues to rise and urine output remains low may need nephrology consult  Smoker  -Likely  underlying COPD P: Continue scheduled nebs Supportive care  Hypomagnesemia P: Supplemented Trend  Leukocytosis -Likely reactive from recent surgery in hemorrhagic shock P: Trend CBC and fever curve No acute indications for antibiotic therapy at this time  Best practice:  Diet: N.p.o. Pain/Anxiety/Delirium protocol (if indicated): propofol  VAP protocol (if indicated): yes DVT prophylaxis: heparn GI prophylaxis: H2b Glucose control: n/a Mobility: BR Code Status: FULL Family Communication: per primary  Disposition:   Labs   CBC: Recent Labs  Lab 04/15/20 1505 04/18/20 1036 04/18/20 1527 04/18/20 1648 04/18/20 1706 04/19/20 0421 04/19/20 0606  WBC 11.2*  --   --  23.2*  --  18.7*  --   NEUTROABS  --   --   --  19.9*  --   --   --   HGB 12.5   < > 8.5* 11.7* 11.6* 9.1* 8.2*  HCT 39.0   < > 25.0* 35.9* 34.0* 27.9* 24.0*  MCV 90.9  --   --  88.2  --  88.3  --   PLT 264  --   --  201  --  169  --    < > = values in this interval not displayed.    Basic Metabolic Panel: Recent Labs  Lab 04/15/20 1505 04/18/20 1036 04/18/20 1148 04/18/20 1237 04/18/20 1523 04/18/20 1523 04/18/20 1527 04/18/20 1648 04/18/20 1706 04/19/20 0421 04/19/20 0606  NA 134*   < > 135   < > 139   < > 139 135 138 140 142  K 4.0   < > 5.1   < > 5.3*   < > 5.3* 5.1 5.2* 5.1 4.8  CL 96*  --  100  --  102  --   --  104  --  110  --   CO2 28  --   --   --   --   --   --  22  --  21*  --   GLUCOSE 209*  --  189*  --  237*  --   --  229*  --  157*  --   BUN 15  --  21  --  19  --   --  18  --  27*  --   CREATININE 0.89  --  0.90  --  1.30*  --   --  1.35*  --  1.68*  --   CALCIUM 9.1  --   --   --   --   --   --  7.9*  --  6.8*  --   MG  --   --   --   --   --   --   --  1.5*  --  1.4*  --   PHOS  --   --   --   --   --   --   --  6.0*  --  5.6*  --    < > = values in this interval not displayed.   GFR: Estimated Creatinine Clearance: 34 mL/min (A) (by C-G formula based on SCr of 1.68  mg/dL (H)). Recent Labs  Lab 04/15/20 1505 04/18/20 1648 04/18/20 1649 04/18/20 1907 04/19/20 0421  WBC 11.2* 23.2*  --   --  18.7*  LATICACIDVEN  --   --  4.4* 2.4*  --     Liver Function Tests: Recent Labs  Lab 04/15/20 1505 04/18/20 1648 04/19/20 0421  AST 16 79* 92*  ALT 15 48* 49*  ALKPHOS 47 28* 32*  BILITOT 0.5 1.6* 0.8  PROT 7.6 4.6* 4.5*  ALBUMIN 3.8 3.2* 3.2*   Recent Labs  Lab 04/19/20 0421  AMYLASE 115*   No results for input(s): AMMONIA in the last 168 hours.  ABG    Component Value Date/Time   PHART 7.300 (L) 04/19/2020 0606   PCO2ART 38.6 04/19/2020 0606   PO2ART 115 (H) 04/19/2020 0606   HCO3 18.9 (L) 04/19/2020 0606   TCO2 20 (L) 04/19/2020 0606   ACIDBASEDEF 7.0 (H) 04/19/2020 0606   O2SAT 98.0 04/19/2020 0606     Coagulation Profile: Recent Labs  Lab 04/15/20 1505 04/18/20 1648  INR 1.1 1.5*    Cardiac Enzymes: No results for input(s): CKTOTAL, CKMB, CKMBINDEX, TROPONINI in the last 168 hours.  HbA1C: Hgb A1c MFr Bld  Date/Time Value Ref Range Status  04/16/2020 01:00 PM 6.9 (H) 4.8 - 5.6 % Final    Comment:    (NOTE) Pre diabetes:          5.7%-6.4%  Diabetes:              >6.4%  Glycemic control for   <7.0% adults with diabetes     CBG: Recent Labs  Lab 04/18/20 0751 04/18/20 1955 04/19/20 0008 04/19/20 0409  GLUCAP 149* 199* 164* 145*    CRITICAL CARE Performed by: Johnsie Cancel  Total critical care time: 84  minutes  Critical care time was exclusive of separately billable procedures and treating other patients.  Critical care was necessary to treat or prevent imminent or life-threatening deterioration.  Critical care was time spent personally by me on the following activities: development of treatment plan with patient and/or surrogate as well as nursing, discussions with consultants, evaluation of patient's response to treatment, examination of patient, obtaining history from patient or surrogate,  ordering and performing treatments and interventions, ordering and review of laboratory studies, ordering and review of radiographic studies, pulse oximetry and re-evaluation of patient's condition.  Johnsie Cancel, NP-C [facility] Contact / Pager information can be found on Amion  04/19/2020, 8:39 AM    PCCM:  69 yo FM, cad, severe peripheral vascular disease, chronic systolic heart failure, s/p aortobifemoral bypass.   BP (!) 105/51   Pulse 67   Temp 100.1 F (37.8 C) (Oral)   Resp (!) 22   Ht 5' 3"  (1.6 m)   Wt 87.1 kg   SpO2 96%   BMI 34.01 kg/m   Gen: elderly, obese FM, comfortable on vent  HENT: tracking  Heart: RRR, s1 s2  Lungs: BL vented breaths  Labs reviewed  UOP reviewed   A:  Post-op vent management  Shock- resolved, post-op anemia  Chronic systolic heart failure Positive cumulative fluid balance  Smoker, likely copd   P: Failed SBT this AM  Lasix X1 today  Follow uop Follow BMP Follow cbc and temp curve  This patient is critically ill with multiple organ system failure; which, requires frequent high complexity decision making, assessment, support, evaluation, and titration of therapies. This was completed through the application of advanced monitoring technologies and extensive interpretation of multiple databases. During this encounter critical care time was devoted to patient care services described in this note for 32 minutes.   Garner Nash, DO [facility] 04/19/2020 2:33 PM

## 2020-04-19 NOTE — Progress Notes (Signed)
PHARMACY NOTE:  ANTIMICROBIAL RENAL DOSAGE ADJUSTMENT  Current antimicrobial regimen includes a mismatch between antimicrobial dosage and estimated renal function.  As per policy approved by the Pharmacy & Therapeutics and Medical Executive Committees, the antimicrobial dosage will be adjusted accordingly.  Current antimicrobial dosage:  Cipro 464m IV Q12H  Indication: intra-abdominal infection  Renal Function:  Estimated Creatinine Clearance: 27.5 mL/min (A) (by C-G formula based on SCr of 2.08 mg/dL (H)). []      On intermittent HD, scheduled: []      On CRRT    Antimicrobial dosage has been changed to:  Cipro 4088mIV Q24H   Moriah Loughry D. DaMina MarblePharmD, BCPS, BCMarion/18/2021, 9:31 PM

## 2020-04-19 NOTE — Progress Notes (Addendum)
AAA Progress Note    04/19/2020 7:16 AM 1 Day Post-Op  Subjective:  Intubated; RN reports combative this am and tried to pull ET tube.   RN reports BM all night with some small specks of blood and now has flexiseal.   Also reports serosanguinous drainage from both groins with left >right (dressings changed this morning)  Afebrile HR 50's-80's SB/NSR 90's-130's cuff and 120's-180's a-line 93%  30 % FiO2  Gtts:   Dexmedetomidine Norepi  (labile and has been titrated through the night)  Vitals:   04/19/20 0637 04/19/20 0700  BP:  (!) 109/54  Pulse: 66 64  Resp: (!) 22 18  Temp:    SpO2: 93% 94%    Physical Exam: Cardiac:  regular Lungs:  intubated Abdomen:  Soft, NT to palpation; +BM Incisions:  Covered and dry Extremities:  +doppler signal bilateral PT and right DP General:  sedated  CBC    Component Value Date/Time   WBC 18.7 (H) 04/19/2020 0421   RBC 3.16 (L) 04/19/2020 0421   HGB 8.2 (L) 04/19/2020 0606   HCT 24.0 (L) 04/19/2020 0606   PLT 169 04/19/2020 0421   MCV 88.3 04/19/2020 0421   MCH 28.8 04/19/2020 0421   MCHC 32.6 04/19/2020 0421   RDW 17.3 (H) 04/19/2020 0421   LYMPHSABS 1.5 04/18/2020 1648   MONOABS 1.1 (H) 04/18/2020 1648   EOSABS 0.1 04/18/2020 1648   BASOSABS 0.1 04/18/2020 1648    BMET    Component Value Date/Time   NA 142 04/19/2020 0606   K 4.8 04/19/2020 0606   CL 110 04/19/2020 0421   CO2 21 (L) 04/19/2020 0421   GLUCOSE 157 (H) 04/19/2020 0421   BUN 27 (H) 04/19/2020 0421   CREATININE 1.68 (H) 04/19/2020 0421   CALCIUM 6.8 (L) 04/19/2020 0421   GFRNONAA 31 (L) 04/19/2020 0421   GFRAA 36 (L) 04/19/2020 0421    INR    Component Value Date/Time   INR 1.5 (H) 04/18/2020 1648     Intake/Output Summary (Last 24 hours) at 04/19/2020 0716 Last data filed at 04/19/2020 0700 Gross per 24 hour  Intake 11858.64 ml  Output 3275 ml  Net 8583.64 ml     Assessment/Plan:  69 y.o. female is s/p  Aortobifemoral bypass grafting 1  Day Post-Op  -Vascular:  Pt has monophasic PT doppler signals bilaterally and right DP.  RN reports that the DP was present earlier in shift but now difficult to find.  Her feet are warm bilaterally.  -Cardiac:  HR is regular; she is requiring pressor support with norepi that has been titrated through the night for labile pressures.  -Pulmonary:  Intubated-most likely extubate today per CCM -Neuro:  Sedated but RN reports she moves all extremities and does not have any deficits when sedation weaned -Renal:  Creatinine up to 1.68 from 0.9-UOP has dropped off throughout the night.  If creatinine continues to worsen, may need renal consult.  She has received albumin and fluid bolus with increased output.  -GI:  Abdomen is soft; she is having somewhat loose stools and required flexiseal.  RN reports some specks of blood in stool.  Difficult to tell with stool in bag.  Will get hemoccult.   -Incisions:  All incisions are covered and dry.  RN reports serosanguinous drainage from bilateral groins with left more than right.  She will need meticulous wound care as she is at high risk for infection given her body habitus.   -Heme/ID:  Acute surgical blood  loss anemia with hgb of 8.2 this am, which is down from 11.7 yesterday afternoon and 12.5 pre-op.  She received 3 units PRBC's and 2 FFP intraoperatively yesterday.  Given she is on Levophed, may need transfusion to help wean this.  Will d/w Dr. Donzetta Matters.  Leukocytosis of 18.7k (down from yesterday of 23.2k).  Most likely related to peri-op, but will need to monitor.  -General:  Intubated and sedated.     Leontine Locket, PA-C [facility] [phone] 04/19/2020 7:16 AM   I have independently interviewed and examined patient and agree with PA assessment and plan above.  She has had watery stools overnight fecal occult blood is positive however not grossly bloody.  Abdomen is soft white count improved from surgery today.  She remains on Levophed  and intubated.  Must have high suspicion for colonic ischemia with ligation of inferior mesenteric artery.  We will start IV antibiotics and recheck labs today. feet are warm and well-perfused she has a good posterior tibial signal on the right and on the l back anterior eft side dorsalis pedis urine output has increased today.  CCM managing ventilator and critical care and their care is much appreciated.  Suzane Vanderweide C. Donzetta Matters, MD [facility] [place] Office: [phone] Pager: [phone]

## 2020-04-20 ENCOUNTER — Encounter (HOSPITAL_COMMUNITY): Payer: Self-pay | Admitting: Vascular Surgery

## 2020-04-20 ENCOUNTER — Inpatient Hospital Stay (HOSPITAL_COMMUNITY): Payer: Medicare Other

## 2020-04-20 DIAGNOSIS — K559 Vascular disorder of intestine, unspecified: Secondary | ICD-10-CM

## 2020-04-20 DIAGNOSIS — J9601 Acute respiratory failure with hypoxia: Secondary | ICD-10-CM

## 2020-04-20 DIAGNOSIS — N179 Acute kidney failure, unspecified: Secondary | ICD-10-CM

## 2020-04-20 LAB — GLUCOSE, CAPILLARY
Glucose-Capillary: 170 mg/dL — ABNORMAL HIGH (ref 70–99)
Glucose-Capillary: 169 mg/dL — ABNORMAL HIGH (ref 70–99)
Glucose-Capillary: 186 mg/dL — ABNORMAL HIGH (ref 70–99)
Glucose-Capillary: 144 mg/dL — ABNORMAL HIGH (ref 70–99)
Glucose-Capillary: 152 mg/dL — ABNORMAL HIGH (ref 70–99)
Glucose-Capillary: 153 mg/dL — ABNORMAL HIGH (ref 70–99)

## 2020-04-20 LAB — CBC
HCT: 22.9 % — ABNORMAL LOW (ref 36.0–46.0)
Hemoglobin: 7.2 g/dL — ABNORMAL LOW (ref 12.0–15.0)
MCH: 28.3 pg (ref 26.0–34.0)
MCHC: 31.4 g/dL (ref 30.0–36.0)
MCV: 90.2 fL (ref 80.0–100.0)
Platelets: 126 10*3/uL — ABNORMAL LOW (ref 150–400)
RBC: 2.54 MIL/uL — ABNORMAL LOW (ref 3.87–5.11)
RDW: 17.2 % — ABNORMAL HIGH (ref 11.5–15.5)
WBC: 13.6 10*3/uL — ABNORMAL HIGH (ref 4.0–10.5)
nRBC: 0 % (ref 0.0–0.2)

## 2020-04-20 LAB — BASIC METABOLIC PANEL
Anion gap: 9 (ref 5–15)
BUN: 37 mg/dL — ABNORMAL HIGH (ref 8–23)
CO2: 20 mmol/L — ABNORMAL LOW (ref 22–32)
Calcium: 6.6 mg/dL — ABNORMAL LOW (ref 8.9–10.3)
Chloride: 112 mmol/L — ABNORMAL HIGH (ref 98–111)
Creatinine, Ser: 2.13 mg/dL — ABNORMAL HIGH (ref 0.44–1.00)
GFR calc Af Amer: 27 mL/min — ABNORMAL LOW (ref >60)
GFR calc non Af Amer: 23 mL/min — ABNORMAL LOW (ref >60)
Glucose, Bld: 181 mg/dL — ABNORMAL HIGH (ref 70–99)
Potassium: 4.5 mmol/L (ref 3.5–5.1)
Sodium: 141 mmol/L (ref 135–145)

## 2020-04-20 LAB — PREPARE RBC (CROSSMATCH)

## 2020-04-20 LAB — HEMOGLOBIN AND HEMATOCRIT, BLOOD
HCT: 30.1 % — ABNORMAL LOW (ref 36.0–46.0)
Hemoglobin: 9.9 g/dL — ABNORMAL LOW (ref 12.0–15.0)

## 2020-04-20 LAB — LACTIC ACID, PLASMA: Lactic Acid, Venous: 1.6 mmol/L (ref 0.5–1.9)

## 2020-04-20 MED ORDER — SODIUM CHLORIDE 0.9% IV SOLUTION: Freq: Once | INTRAVENOUS | Status: AC

## 2020-04-20 MED ORDER — IOHEXOL 300 MG/ML  SOLN
75.0000 mL | Freq: Once | INTRAMUSCULAR | Status: AC | PRN
  Administered 2020-04-20: 75 mL via INTRAVENOUS

## 2020-04-20 MED ORDER — SODIUM CHLORIDE 0.9 % IV SOLN: INTRAVENOUS | Status: DC

## 2020-04-20 MED ORDER — BUDESONIDE 0.5 MG/2ML IN SUSP
0.5000 mg | Freq: Two times a day (BID) | RESPIRATORY_TRACT | Status: DC
  Administered 2020-04-20 – 2020-04-27 (×11): 0.5 mg via RESPIRATORY_TRACT
  Filled 2020-04-20 (×16): qty 2

## 2020-04-20 MED ORDER — CHLORHEXIDINE GLUCONATE 0.12 % MT SOLN
OROMUCOSAL | Status: AC
  Filled 2020-04-20: qty 15

## 2020-04-20 MED ORDER — FUROSEMIDE 10 MG/ML IJ SOLN
60.0000 mg | Freq: Once | INTRAMUSCULAR | Status: AC
  Administered 2020-04-20: 60 mg via INTRAVENOUS
  Filled 2020-04-20: qty 6

## 2020-04-20 NOTE — Progress Notes (Signed)
°  °  CT scan reviewed demonstrates thickening of of the rectum and sigmoid colon.  Aortobifemoral bypass graft grossly patent.  I can readily palpate right common femoral pulse difficulty on the left given her size and high anastomosis but certainly all looks patent by CT scan although there are still very weak signals in the left foot possibly due to Levophed infusion.  I will consult general surgery to have them evaluate patient.  Plan communicated to critical care team Dr. Elsworth Soho as well as patient's husband.  Servando Snare, MD

## 2020-04-20 NOTE — Progress Notes (Signed)
Communicated CT regarding pt's STAT CT w contrast orders. CT staff unable to go through with scan with contrast d/t pt's decreased GFR, CrCl, and serum creatinine. MD Donzetta Matters notified of this concern and provided with CT department contact information. Will await further instruction. Will continue to monitor and assess.

## 2020-04-20 NOTE — Progress Notes (Addendum)
Progress Note    04/20/2020 10:41 AM 1 Day Post-Op  Subjective: Underwent sigmoidoscopy overnight with findings of ischemic changes beginning in the distal rectum as far as the scope could be passed.  Overnight she has continued Levophed requirement although not increasing.  She continues to make urine.  Flexi-Seal tube has been left out there is been no further GI output  Vitals:   04/20/20 0845 04/20/20 0900  BP: (!) 108/49 (!) 124/52  Pulse: 78 79  Resp: (!) 27 (!) 33  Temp: 98.2 F (36.8 C) 98.4 F (36.9 C)  SpO2: 95%     Physical Exam: Remains intubated does respond easily to commands Abdomen is soft with mild tenderness to palpation diffusely Bilateral groin incisions clean dry intact with staples I cannot readily feel palpable common femoral pulses nor can I find signals in her distal feet where previously she had them  CBC    Component Value Date/Time   WBC 13.6 (H) 04/20/2020 0438   RBC 2.54 (L) 04/20/2020 0438   HGB 7.2 (L) 04/20/2020 0438   HCT 22.9 (L) 04/20/2020 0438   PLT 126 (L) 04/20/2020 0438   MCV 90.2 04/20/2020 0438   MCH 28.3 04/20/2020 0438   MCHC 31.4 04/20/2020 0438   RDW 17.2 (H) 04/20/2020 0438   LYMPHSABS 1.3 04/19/2020 1549   MONOABS 1.1 (H) 04/19/2020 1549   EOSABS 0.1 04/19/2020 1549   BASOSABS 0.1 04/19/2020 1549    BMET    Component Value Date/Time   NA 141 04/20/2020 0438   K 4.5 04/20/2020 0438   CL 112 (H) 04/20/2020 0438   CO2 20 (L) 04/20/2020 0438   GLUCOSE 181 (H) 04/20/2020 0438   BUN 37 (H) 04/20/2020 0438   CREATININE 2.13 (H) 04/20/2020 0438   CALCIUM 6.6 (L) 04/20/2020 0438   GFRNONAA 23 (L) 04/20/2020 0438   GFRAA 27 (L) 04/20/2020 0438    INR    Component Value Date/Time   INR 1.5 (H) 04/18/2020 1648     Intake/Output Summary (Last 24 hours) at 04/20/2020 1041 Last data filed at 04/20/2020 1000 Gross per 24 hour  Intake 1801.11 ml  Output 1430 ml  Net 371.11 ml     Assessment:  69 y.o. female is  s/p aortobifemoral bypass for rest pain bilateral lower extremities with moderately depressed ABIs.  She had heavily calcified aorta.  She now is undergoing flexible sigmoidoscopy demonstrating severe ischemic changes beginning in the rectum extending up into the sigmoid colon.  We elected not to operate last night given patient's overall stability with soft abdomen and she continues to make urine labs overall improving.  Plan: -Vascular:   I cannot detect strong signals at her feet today only venous appearing signals her left foot does appear ischemic this could be secondary to ongoing pressor requirement although it is difficult to palpate her common femoral pulses as well.  We are proceeding with CT scan abdomen pelvis with contrast today. -Cardiac:  She continues on Levophed drip -Pulmonary:   CCM following and input much appreciated continue intubation for now -Neuro:   She is sedated on Precedex easily arousable -Renal:   Creatinine appears to be peaking just over 2 which is double her baseline she does have adequate urine output -GI:   Images from last night's flexible sigmoidoscopy were reviewed and I discussed the outcomes with Dr. Cristina Gong.  We will proceed with CT scan to evaluate both her aortobifemoral bypass graft as well as her colon today. -Incisions:  All  incisions are covered and dry.  RN reports serosanguinous drainage from bilateral groins with left more than right.  She will need meticulous wound care as she is at high risk for infection given her body habitus.   -Heme/ID:   Continue IV antibiotics for concern of colonic ischemia.  Transfused 2 units packed red blood cells this morning for symptomatic anemia  I have called to update her husband.   Katarina Riebe C. Donzetta Matters, MD [facility] Office: [phone] Pager: [phone]  04/20/2020 10:41 AM

## 2020-04-20 NOTE — Progress Notes (Addendum)
NAME:  Tonya Hughes, MRN:  [id number], DOB:  [birth year], LOS: 2 ADMISSION DATE:  04/18/2020, CONSULTATION DATE: 04/18/2020 REFERRING MD: Servando Snare, MD, CHIEF COMPLAINT: Shock, postop aortobifem.  Brief History   This is a 69 year old female with significant coronary disease and peripheral vascular disease.  Also has chronic systolic heart failure.  Patient was taken to the operating room today for an elective aortobifemoral bypass.  During operation patient had significant blood loss and decision was made for transfer to the intensive care unit as she received product, fluid resuscitation with minimal urine output.  Patient was left intubated and transferred to the ICU.  Pulmonary was consulted for recommendations of management.    Past Medical History   Past Medical History:  Diagnosis Date  . Arthritis    HANDS  . Back pain   . Congestive heart failure (CHF) ([place])   . Depressive disorder   . Diabetes mellitus without complication ([place])    new, A1c 6.9% 04/2020  . DVT (deep venous thrombosis) ([place])    Right LE s/p back surgery stop date lovenox 09/05/17  . Dyspnea   . Gastritis   . GERD (gastroesophageal reflux disease)   . Hypercholesteremia   . Hypertension   . Pneumonia      Significant Hospital Events   04/18/2020: ICU admission  Consults:  Vascular surgery Pulmonary critical care  Procedures:  6/172021: Elective aortobifemoral bypass 6/18 >> flex sig -severe ischemic changes from distal rectum onwards, not frank gangrene ETT 6/18 >>  Significant Diagnostic Tests:  04/15/2020: Nuclear medicine cardiac imaging, LVEF 44%  Micro Data:    Antimicrobials:  Vancomycin x1, per-op 6/18 cipro >> 6/18 flagyl >>  Interim history/subjective:   Persistently hypotensive although on low-dose of Levophed. Critically ill intubated Overnight persistent high lactate, flex sig performed and evidence of bowel ischemia noted Urine output adequate Agitated this morning when  Precedex lowered Febrile 101  Objective   Blood pressure (!) 93/52, pulse 79, temperature 98.8 F (37.1 C), temperature source Oral, resp. rate (!) 29, height 5' 3"  (1.6 m), weight 89.4 kg, SpO2 98 %. CVP:  [1 mmHg-25 mmHg] 13 mmHg  Vent Mode: PRVC FiO2 (%):  [30 %-40 %] 30 % Set Rate:  [18 bmp] 18 bmp Vt Set:  [420 mL] 420 mL PEEP:  [5 cmH20] 5 cmH20 Pressure Support:  [5 cmH20] 5 cmH20   Intake/Output Summary (Last 24 hours) at 04/20/2020 0804 Last data filed at 04/20/2020 0700 Gross per 24 hour  Intake 1878.64 ml  Output 1380 ml  Net 498.64 ml   Filed Weights   04/18/20 0749 04/19/20 0500 04/20/20 0145  Weight: 79.8 kg 87.1 kg 89.4 kg    Examination: General: Elderly woman lying in bed in mild distress HEENT: ETT, MM pink/moist, PERRL, sclera nonicteric Neuro: Follows one-step commands but severely anxious and agitated and squirming in bed CV: s1s2 regular rate and rhythm, no murmur, rubs, or gallops,  PULM:  bilateral scattered crackles, increased accessory muscle use when placed on pressure support GI: soft, bowel sounds active in all 4 quadrants, non-tender, non-distended Extremities: warm/dry, no edema  Skin: no rashes or lesions  Chest x-ray 6/18 personally reviewed shows mild bibasilar atelectasis and cardiomegaly Labs show plateauing of creatinine, normal electrolytes, lactate decreased, decreasing leukocytosis, drop in hemoglobin further from 8.9-7.2  Resolved Hospital Problem list     Assessment & Plan:  Course complicated by blood loss and ischemic bowel   Acute respiratory failure.  Perioperatively - Patient postoperatively  was left intubated as she received significant blood product transfusion as well as crystalloid.  She received approximately 3 L of resuscitation P:  Spontaneous breathing trials with goal extubation -May need further optimization and diuresis before extubating -Use Precedex and intermittent fentanyl as needed  Hemorrhagic shock ,  improving -Estimated blood loss 1800 cc per operative report Chronic systolic heart failure -Home medications include carvedilol, Lasix, lisinopril, and Paxil P: Remains on low-dose norepinephrine, lower doses today MAP goal greater than 65 Can resume goal-directed heart failure regiment once of vasopressors  Acute blood loss anemia, postop blood loss -Related to surgery - 3 units of PRBC and 2 of FFP so far P: We will transfuse 1 unit PRBC given cardio vascular comorbidities , aim for goal hemoglobin 8 NML Monitor for recurrence of the bleeding   Acute Kidney Injury  -in the setting of hypovolemic shock. Baseline creatinine 0.8-0.9, creatinine currently 1.68 P: Follow renal function  Trend Bmet Avoid nephrotoxins, ensure adequate renal perfusion  We will diurese with Lasix 60x1 If creatinine continues to rise and urine output remains low may need nephrology consult  Ischemic bowel -Conservative care per vascular and GI -Hold off tube feeds for now  Smoker  -Likely underlying COPD P: Continue scheduled nebs Supportive care  Hypomagnesemia P: Supplemented Trend  Leukocytosis -Likely reactive from recent surgery in hemorrhagic shock , improving P: Trend CBC and fever curve No acute indications for antibiotic therapy at this time  Best practice:  Diet: N.p.o. Pain/Anxiety/Delirium protocol (if indicated): Precedex VAP protocol (if indicated): yes DVT prophylaxis: heparn GI prophylaxis: H2b Glucose control: n/a Mobility: BR Code Status: FULL Family Communication: per primary  Disposition:   The patient is critically ill with multiple organ systems failure and requires high complexity decision making for assessment and support, frequent evaluation and titration of therapies, application of advanced monitoring technologies and extensive interpretation of multiple databases. Critical Care Time devoted to patient care services described in this note independent of  APP/resident  time is 35 minutes.   Kara Mead MD. [facility].  Pulmonary & Critical care  If no response to pager , please call [phone]   04/20/2020

## 2020-04-20 NOTE — Progress Notes (Signed)
PT Cancellation Note  Patient Details Name: AYAKA ANDES MRN: [id number] DOB: Nov 12, 1950   Cancelled Treatment:    Reason Eval/Treat Not Completed: Patient not medically ready (pt remains intubated with plans for hopeful extubation today with continued hold on eval per RN)   Sandy Salaam Han Vejar 04/20/2020, 9:30 AM  Bayard Males, PT Acute Rehabilitation Services Pager: [phone] Office: [phone]

## 2020-04-20 NOTE — Consult Note (Signed)
Reason for Consult:  Ischemic colitis Referring Physician: Servando Snare, MD GI - [place]  Tonya Hughes is an 69 y.o. female.  HPI: This is a 69 year old female with significant coronary artery disease and extensive peripheral vascular disease s/p elective aortobifemoral bypass 04/18/20 by Dr. Donzetta Matters.  The IMA was ligated and not reimplanted.  On POD #1, the patient began to have some bloody diarrhea and an elevated lactate level, as well increasing requirement for pressors.  Dr. Cristina Gong performed a flex sigmoidoscopy on 04/19/20 that showed severe ischemic changes beginning in the distal rectum.  No obvious gangrene.    Today, the patient's pressor requirement has decreased and the lactate is now normal at 1.6.  She has had no further diarrhea.   UOP has been adequate.  Abdominal tenderness is improved.  She received 2 u PRBC today and follow-up Hgb 9.9.  CT scan today showed decreased enhancement of the left colon down through the sigmoid colon to the rectum with some mild wall thickening.  Past Medical History:  Diagnosis Date  . Arthritis    HANDS  . Back pain   . Congestive heart failure (CHF) ([place])   . Depressive disorder   . Diabetes mellitus without complication ([place])    new, A1c 6.9% 04/2020  . DVT (deep venous thrombosis) ([place])    Right LE s/p back surgery stop date lovenox 09/05/17  . Dyspnea   . Gastritis   . GERD (gastroesophageal reflux disease)   . Hypercholesteremia   . Hypertension   . Pneumonia     Past Surgical History:  Procedure Laterality Date  . ABDOMINAL AORTOGRAM W/LOWER EXTREMITY Bilateral 11/13/2019   Procedure: ABDOMINAL AORTOGRAM W/LOWER EXTREMITY;  Surgeon: Waynetta Sandy, MD;  Location: [facility];  Service: Cardiovascular;  Laterality: Bilateral;  . ABDOMINAL HYSTERECTOMY    . AORTA - BILATERAL FEMORAL ARTERY BYPASS GRAFT N/A 04/18/2020   Procedure: AORTA BIFEMORAL BYPASS;  Surgeon: Waynetta Sandy, MD;  Location: [facility];   Service: Vascular;  Laterality: N/A;  . AORTIC ENDARTERECETOMY N/A 04/18/2020   Procedure: abdominal AORTIC ENDARTERECETOMY;  Surgeon: Waynetta Sandy, MD;  Location: [facility];  Service: Vascular;  Laterality: N/A;  . APPENDECTOMY    . BACK SURGERY    . CERVICAL SPINE SURGERY    . CHOLECYSTECTOMY    . DIAGNOSTIC LAPAROSCOPY     lap. chole  . ENDARTERECTOMY FEMORAL Right 04/18/2020   Procedure: RIGHT FEMORAL ENDARTERECTOMY;  Surgeon: Waynetta Sandy, MD;  Location: [facility];  Service: Vascular;  Laterality: Right;  . LUMBAR SPINE SURGERY      History reviewed. No pertinent family history.  Social History:  reports that she has been smoking cigarettes. She has a 40.00 pack-year smoking history. She has never used smokeless tobacco. She reports previous alcohol use. She reports that she does not use drugs.  Allergies:  Allergies  Allergen Reactions  . Zanaflex [Tizanidine Hcl] Hives and Swelling  . Penicillins Itching and Rash    Did it involve swelling of the face/tongue/throat, SOB, or low BP? No Did it involve sudden or severe rash/hives, skin peeling, or any reaction on the inside of your mouth or nose? Yes Did you need to seek medical attention at a hospital or doctor's office? Yes When did it last happen?40 + years If all above answers are "NO", may proceed with cephalosporin use.     Medications:  Prior to Admission medications   Medication Sig Start Date End Date Taking?  Authorizing Provider  aspirin EC 81 MG tablet Take 81 mg by mouth daily.   Yes [provider]  atorvastatin (LIPITOR) 40 MG tablet Take 1 tablet (40 mg total) by mouth daily. 03/25/20 06/23/20 Yes Herminio Commons, MD  carvedilol (COREG) 3.125 MG tablet TAKE ONE TABLET BY MOUTH TWICE DAILY Patient taking differently: Take 3.125 mg by mouth 2 (two) times daily.  09/21/19  Yes Herminio Commons, MD  fexofenadine (ALLEGRA) 180 MG tablet Take 180 mg by mouth daily.   Yes  [provider]  furosemide (LASIX) 40 MG tablet Take 40 mg by mouth daily.   Yes [provider]  lisinopril (PRINIVIL,ZESTRIL) 5 MG tablet Take 5 mg by mouth daily.   Yes [provider]  PARoxetine (PAXIL) 20 MG tablet Take 20 mg by mouth daily.   Yes [provider]  potassium chloride SA (K-DUR,KLOR-CON) 20 MEQ tablet TAKE ONE (1) TABLET EACH DAY Patient taking differently: Take 20 mEq by mouth daily.  01/03/19  Yes Herminio Commons, MD     Results for orders placed or performed during the hospital encounter of 04/18/20 (from the past 48 hour(s))  Lactic acid, plasma     Status: Abnormal   Collection Time: 04/18/20  7:07 PM  Result Value Ref Range   Lactic Acid, Venous 2.4 (HH) 0.5 - 1.9 mmol/L    Comment: CRITICAL VALUE NOTED.  VALUE IS CONSISTENT WITH PREVIOUSLY REPORTED AND CALLED VALUE. Performed at [facility], [place] [address], [place], [place] [address]   Glucose, capillary     Status: Abnormal   Collection Time: 04/18/20  7:55 PM  Result Value Ref Range   Glucose-Capillary 199 (H) 70 - 99 mg/dL    Comment: Glucose reference range applies only to samples taken after fasting for at least 8 hours.  Glucose, capillary     Status: Abnormal   Collection Time: 04/19/20 12:08 AM  Result Value Ref Range   Glucose-Capillary 164 (H) 70 - 99 mg/dL    Comment: Glucose reference range applies only to samples taken after fasting for at least 8 hours.  Glucose, capillary     Status: Abnormal   Collection Time: 04/19/20  4:09 AM  Result Value Ref Range   Glucose-Capillary 145 (H) 70 - 99 mg/dL    Comment: Glucose reference range applies only to samples taken after fasting for at least 8 hours.  CBC     Status: Abnormal   Collection Time: 04/19/20  4:21 AM  Result Value Ref Range   WBC 18.7 (H) 4.0 - 10.5 K/uL   RBC 3.16 (L) 3.87 - 5.11 MIL/uL   Hemoglobin 9.1 (L) 12.0 - 15.0 g/dL   HCT 27.9 (L) 36 - 46 %   MCV 88.3 80.0 - 100.0 fL   MCH  28.8 26.0 - 34.0 pg   MCHC 32.6 30.0 - 36.0 g/dL   RDW 17.3 (H) 11.5 - 15.5 %   Platelets 169 150 - 400 K/uL   nRBC 0.0 0.0 - 0.2 %    Comment: Performed at [facility] [address], [place], [place] [address]  Comprehensive metabolic panel     Status: Abnormal   Collection Time: 04/19/20  4:21 AM  Result Value Ref Range   Sodium 140 135 - 145 mmol/L   Potassium 5.1 3.5 - 5.1 mmol/L   Chloride 110 98 - 111 mmol/L   CO2 21 (L) 22 - 32 mmol/L   Glucose, Bld 157 (H)  70 - 99 mg/dL    Comment: Glucose reference range applies only to samples taken after fasting for at least 8 hours.   BUN 27 (H) 8 - 23 mg/dL   Creatinine, Ser 1.68 (H) 0.44 - 1.00 mg/dL   Calcium 6.8 (L) 8.9 - 10.3 mg/dL   Total Protein 4.5 (L) 6.5 - 8.1 g/dL   Albumin 3.2 (L) 3.5 - 5.0 g/dL   AST 92 (H) 15 - 41 U/L   ALT 49 (H) 0 - 44 U/L   Alkaline Phosphatase 32 (L) 38 - 126 U/L   Total Bilirubin 0.8 0.3 - 1.2 mg/dL   GFR calc non Af Amer 31 (L) >60 mL/min   GFR calc Af Amer 36 (L) >60 mL/min   Anion gap 9 5 - 15    Comment: Performed at [facility] [address] [place], [place] [address]  Magnesium     Status: Abnormal   Collection Time: 04/19/20  4:21 AM  Result Value Ref Range   Magnesium 1.4 (L) 1.7 - 2.4 mg/dL    Comment: Performed at [facility] [address], [place], [place] [address]  Amylase     Status: Abnormal   Collection Time: 04/19/20  4:21 AM  Result Value Ref Range   Amylase 115 (H) 28 - 100 U/L    Comment: Performed at [facility], [place] [address], [place], [place] [address]  Phosphorus     Status: Abnormal   Collection Time: 04/19/20  4:21 AM  Result Value Ref Range   Phosphorus 5.6 (H) 2.5 - 4.6 mg/dL    Comment: Performed at [facility] [address], [place], [place] [address]  I-STAT 7, (LYTES, BLD GAS, ICA, H+H)     Status: Abnormal   Collection Time: 04/19/20  6:06 AM  Result Value Ref Range   pH, Arterial 7.300 (L) 7.35 - 7.45   pCO2  arterial 38.6 32 - 48 mmHg   pO2, Arterial 115 (H) 83 - 108 mmHg   Bicarbonate 18.9 (L) 20.0 - 28.0 mmol/L   TCO2 20 (L) 22 - 32 mmol/L   O2 Saturation 98.0 %   Acid-base deficit 7.0 (H) 0.0 - 2.0 mmol/L   Sodium 142 135 - 145 mmol/L   Potassium 4.8 3.5 - 5.1 mmol/L   Calcium, Ion 0.97 (L) 1.15 - 1.40 mmol/L   HCT 24.0 (L) 36 - 46 %   Hemoglobin 8.2 (L) 12.0 - 15.0 g/dL   Patient temperature 99.2 F    Sample type ARTERIAL   Glucose, capillary     Status: Abnormal   Collection Time: 04/19/20  8:13 AM  Result Value Ref Range   Glucose-Capillary 141 (H) 70 - 99 mg/dL    Comment: Glucose reference range applies only to samples taken after fasting for at least 8 hours.  Occult blood card to lab, stool     Status: Abnormal   Collection Time: 04/19/20  9:00 AM  Result Value Ref Range   Fecal Occult Bld POSITIVE (A) NEGATIVE    Comment: Performed at [facility], [address], [place], [place] [address]  Glucose, capillary     Status: Abnormal   Collection Time: 04/19/20 11:58 AM  Result Value Ref Range   Glucose-Capillary 158 (H) 70 - 99 mg/dL    Comment: Glucose reference range applies only to samples taken after fasting for at least 8 hours.  Glucose, capillary     Status: Abnormal  Collection Time: 04/19/20  3:37 PM  Result Value Ref Range   Glucose-Capillary 166 (H) 70 - 99 mg/dL    Comment: Glucose reference range applies only to samples taken after fasting for at least 8 hours.  Basic metabolic panel     Status: Abnormal   Collection Time: 04/19/20  3:49 PM  Result Value Ref Range   Sodium 139 135 - 145 mmol/L   Potassium 4.9 3.5 - 5.1 mmol/L   Chloride 111 98 - 111 mmol/L   CO2 19 (L) 22 - 32 mmol/L   Glucose, Bld 177 (H) 70 - 99 mg/dL    Comment: Glucose reference range applies only to samples taken after fasting for at least 8 hours.   BUN 35 (H) 8 - 23 mg/dL   Creatinine, Ser 2.08 (H) 0.44 - 1.00 mg/dL   Calcium 6.6 (L) 8.9 - 10.3 mg/dL   GFR calc non Af Amer  24 (L) >60 mL/min   GFR calc Af Amer 28 (L) >60 mL/min   Anion gap 9 5 - 15    Comment: Performed at [facility] [address], [place], [place] [address]  Lactic acid, plasma     Status: None   Collection Time: 04/19/20  3:49 PM  Result Value Ref Range   Lactic Acid, Venous 1.9 0.5 - 1.9 mmol/L    Comment: Performed at [facility] [address] [place], [place] [address]  CBC with Differential/Platelet     Status: Abnormal   Collection Time: 04/19/20  3:49 PM  Result Value Ref Range   WBC 19.7 (H) 4.0 - 10.5 K/uL   RBC 3.13 (L) 3.87 - 5.11 MIL/uL   Hemoglobin 8.9 (L) 12.0 - 15.0 g/dL   HCT 27.7 (L) 36 - 46 %   MCV 88.5 80.0 - 100.0 fL   MCH 28.4 26.0 - 34.0 pg   MCHC 32.1 30.0 - 36.0 g/dL   RDW 17.5 (H) 11.5 - 15.5 %   Platelets 160 150 - 400 K/uL   nRBC 0.0 0.0 - 0.2 %   Neutrophils Relative % 86 %   Neutro Abs 17.1 (H) 1.7 - 7.7 K/uL   Lymphocytes Relative 7 %   Lymphs Abs 1.3 0.7 - 4.0 K/uL   Monocytes Relative 5 %   Monocytes Absolute 1.1 (H) 0 - 1 K/uL   Eosinophils Relative 1 %   Eosinophils Absolute 0.1 0 - 0 K/uL   Basophils Relative 0 %   Basophils Absolute 0.1 0 - 0 K/uL   Immature Granulocytes 1 %   Abs Immature Granulocytes 0.13 (H) 0.00 - 0.07 K/uL    Comment: Performed at [facility] [address] [place], [place] [address]  Lactic acid, plasma     Status: Abnormal   Collection Time: 04/19/20  6:19 PM  Result Value Ref Range   Lactic Acid, Venous 2.6 (HH) 0.5 - 1.9 mmol/L    Comment: CRITICAL RESULT CALLED TO, READ BACK BY AND VERIFIED WITH: Arnell Sieving RN [id number] [phone] Sander Radon Performed at [facility], [address], [place], [place] [address]   Glucose, capillary     Status: Abnormal   Collection Time: 04/19/20  7:46 PM  Result Value Ref Range   Glucose-Capillary 166 (H) 70 - 99 mg/dL    Comment: Glucose reference range applies only to samples taken after fasting for at least 8 hours.  Glucose, capillary      Status: Abnormal  Collection Time: 04/19/20 11:20 PM  Result Value Ref Range   Glucose-Capillary 166 (H) 70 - 99 mg/dL    Comment: Glucose reference range applies only to samples taken after fasting for at least 8 hours.  Glucose, capillary     Status: Abnormal   Collection Time: 04/20/20  4:33 AM  Result Value Ref Range   Glucose-Capillary 170 (H) 70 - 99 mg/dL    Comment: Glucose reference range applies only to samples taken after fasting for at least 8 hours.  CBC     Status: Abnormal   Collection Time: 04/20/20  4:38 AM  Result Value Ref Range   WBC 13.6 (H) 4.0 - 10.5 K/uL   RBC 2.54 (L) 3.87 - 5.11 MIL/uL   Hemoglobin 7.2 (L) 12.0 - 15.0 g/dL   HCT 22.9 (L) 36 - 46 %   MCV 90.2 80.0 - 100.0 fL   MCH 28.3 26.0 - 34.0 pg   MCHC 31.4 30.0 - 36.0 g/dL   RDW 17.2 (H) 11.5 - 15.5 %   Platelets 126 (L) 150 - 400 K/uL    Comment: Immature Platelet Fraction may be clinically indicated, consider ordering this additional test [id number] REPEATED TO VERIFY    nRBC 0.0 0.0 - 0.2 %    Comment: Performed at [facility], [place] [address] [place], [place] [address]  Basic metabolic panel     Status: Abnormal   Collection Time: 04/20/20  4:38 AM  Result Value Ref Range   Sodium 141 135 - 145 mmol/L   Potassium 4.5 3.5 - 5.1 mmol/L   Chloride 112 (H) 98 - 111 mmol/L   CO2 20 (L) 22 - 32 mmol/L   Glucose, Bld 181 (H) 70 - 99 mg/dL    Comment: Glucose reference range applies only to samples taken after fasting for at least 8 hours.   BUN 37 (H) 8 - 23 mg/dL   Creatinine, Ser 2.13 (H) 0.44 - 1.00 mg/dL   Calcium 6.6 (L) 8.9 - 10.3 mg/dL   GFR calc non Af Amer 23 (L) >60 mL/min   GFR calc Af Amer 27 (L) >60 mL/min   Anion gap 9 5 - 15    Comment: Performed at [facility] [address], [place], [place] [address]  Lactic acid, plasma     Status: None   Collection Time: 04/20/20  4:38 AM  Result Value Ref Range   Lactic Acid, Venous 1.6 0.5 - 1.9 mmol/L    Comment:  Performed at [facility] [address], [place], [place] [address]  Prepare RBC (crossmatch)     Status: None   Collection Time: 04/20/20  7:45 AM  Result Value Ref Range   Order Confirmation      BB SAMPLE OR UNITS ALREADY AVAILABLE Performed at [facility] [address], [place], [place] [address]   Glucose, capillary     Status: Abnormal   Collection Time: 04/20/20  8:16 AM  Result Value Ref Range   Glucose-Capillary 169 (H) 70 - 99 mg/dL    Comment: Glucose reference range applies only to samples taken after fasting for at least 8 hours.  Prepare RBC (crossmatch)     Status: None   Collection Time: 04/20/20  8:27 AM  Result Value Ref Range   Order Confirmation      ORDER PROCESSED BY BLOOD BANK Performed at [facility], [place] [address] [place], [place] [address]   Glucose, capillary  Status: Abnormal   Collection Time: 04/20/20 11:29 AM  Result Value Ref Range   Glucose-Capillary 186 (H) 70 - 99 mg/dL    Comment: Glucose reference range applies only to samples taken after fasting for at least 8 hours.  Glucose, capillary     Status: Abnormal   Collection Time: 04/20/20  3:11 PM  Result Value Ref Range   Glucose-Capillary 144 (H) 70 - 99 mg/dL    Comment: Glucose reference range applies only to samples taken after fasting for at least 8 hours.  Hemoglobin and hematocrit, blood     Status: Abnormal   Collection Time: 04/20/20  3:41 PM  Result Value Ref Range   Hemoglobin 9.9 (L) 12.0 - 15.0 g/dL    Comment: REPEATED TO VERIFY POST TRANSFUSION SPECIMEN    HCT 30.1 (L) 36 - 46 %    Comment: Performed at [facility] [address], [place], [place]  [address]    CT ABDOMEN PELVIS W CONTRAST  Result Date: 04/20/2020 CLINICAL DATA:  69 year old female with a history of abdominal pain and fever, status post aorto iliac surgery EXAM: CT ABDOMEN AND PELVIS WITH CONTRAST TECHNIQUE: Multidetector CT imaging of the abdomen and pelvis was performed  using the standard protocol following bolus administration of intravenous contrast. CONTRAST:  22m OMNIPAQUE IOHEXOL 300 MG/ML  SOLN COMPARISON:  CT 03/21/2020 FINDINGS: Lower chest: Small pleural effusions of the bilateral lung bases with associated atelectasis. Hepatobiliary: Unremarkable appearance of the liver. Cholecystectomy. Pancreas: Unremarkable Spleen: Unremarkable Adrenals/Urinary Tract: - Right adrenal gland:  Unremarkable - Left adrenal gland: Unremarkable. - Right kidney: Relatively symmetric perfusion to the left kidney. No hydronephrosis. Unremarkable right ureter. Vascular calcifications in the hilum of the right kidney. No focal lesion. - Left Kidney: Relatively symmetric perfusion to the right kidney. No hydronephrosis. No focal lesion. - Urinary Bladder: Urinary bladder relatively decompressed with a balloon retention catheter in place. Stomach/Bowel: - Stomach: Gastric tube terminates within the stomach. Stomach is decompressed. - Small bowel:Small bowel appears relatively unremarkable without distention, air-fluid levels, or transition. No focal wall thickening - Appendix: Appendix is not visualized, however, no inflammatory changes are present adjacent to the cecum to indicate an appendicitis. - Colon: Proximal colon is decompressed with small stool burden. Mucosa enhances of the right colon transverse colon to the splenic flexure. At the splenic flexure and through the descending colon there is mild circumferential wall thickening with decreased attenuation of the colon wall, particularly within the sigmoid colon on image 64. Decreased attenuation extends through the rectum. No significant distention. No perforation. Vascular/Lymphatic: Surgical changes of aorto bi-iliac bypass. The proximal anastomosis is not well visualized given the streak artifact, although just beyond the anastomosis the graft is patent throughout its length. Both the left and the right limb of the graft are patent to  the distal anastomosis. The proximal femoral vasculature remains patent. Study is nondiagnostic for confident evaluation of any stenosis at the proximal surgical anastomosis. Atherosclerotic changes of the celiac artery origin and the SMA artery origin, better characterized on prior CT angiogram. Atherosclerotic changes at the bilateral renal artery origins, better characterized on prior CT angiogram. Reproductive: Hysterectomy. Other: Postsurgical changes of the midline abdomen. Surgical changes in the periaortic region without evidence of extravasation of contrast or blood products. Trace fluid in the right pericolic gutter. Trace fluid in the anatomic pelvis. Musculoskeletal: Thoracolumbar fixation of T10-S1. This contributes to significant streak artifact limiting evaluation of the canal, and the retroperitoneum. No acute displaced fracture. IMPRESSION:  Interval surgical changes of aorto bi-iliac bypass. Although the proximal bypass anastomosis is not well evaluated given the protocol and the significant streak artifact, the bypass appears patent, including the proximal aortic limb, and the bilateral iliac limbs. Proximal femoral vasculature appears patent. Decreased attenuation/enhancement of the left colon extending from the splenic flexure through the sigmoid colon and rectum, concerning for decreased perfusion/ischemia. No evidence of perforation. Prelim results were discussed at the time of interpretation on 04/20/2020 at 2:22 pm with Dr. Donzetta Matters. CT again demonstrates mesenteric and renal arterial disease, better characterized on the previous CT angiogram 03/21/2020. Aortic Atherosclerosis (ICD10-I70.0). Relatively symmetric perfusion of the kidneys. Additional ancillary findings as above. Signed, Dulcy Fanny. Dellia Nims, RPVI [facility] Electronically Signed   By: Corrie Mckusick D.O.   On: 04/20/2020 14:24   DG Chest Port 1 View  Result Date:  04/19/2020 CLINICAL DATA:  Check endotracheal tube placement EXAM: PORTABLE CHEST 1 VIEW COMPARISON:  04/18/2020 FINDINGS: Endotracheal tube, gastric catheter and right jugular central line are again seen and stable. Cardiac shadow is mildly enlarged stable. The lungs are well aerated bilaterally. Minimal bibasilar atelectasis is seen. No bony abnormality is noted. IMPRESSION: Mild bibasilar atelectasis. Tubes and lines as described. Electronically Signed   By: Inez Catalina M.D.   On: 04/19/2020 09:00    Review of Systems  Unable to perform ROS: Intubated   Blood pressure (!) 122/56, pulse 72, temperature 99.7 F (37.6 C), resp. rate (!) 22, height 5' 3"  (1.6 m), weight 89.4 kg, SpO2 99 %. Physical Exam Intubated, sedated, arousable Follows some commands Levophed down to 3 Normotensive Abd - soft, no significant left sided tenderness; no guarding, no rebound  Assessment/Plan: Ischemic colitis of the left/sigmoid colon and proximal rectum - likely chronic disease exacerbated by recent surgical bypass with ligation of the IMA. No signs of perforation or worsening ischemia - patient seems to be clinically improving Would be cautious in advance diet when patient extubated Will follow.  Imogene Burn Abbegail Matuska 04/20/2020, 5:48 PM

## 2020-04-20 NOTE — Progress Notes (Signed)
RT in to troubleshoot left radial arterial line. Line flushes, but does not pull back blood, and no waveform is noted on monitor. RT attempted to redress line, but catheter was kinked, and unable to be saved. Arterial line removed at that time.

## 2020-04-21 ENCOUNTER — Inpatient Hospital Stay (HOSPITAL_COMMUNITY): Payer: Medicare Other

## 2020-04-21 ENCOUNTER — Encounter (HOSPITAL_COMMUNITY): Payer: Self-pay | Admitting: Gastroenterology

## 2020-04-21 LAB — BASIC METABOLIC PANEL
Anion gap: 9 (ref 5–15)
BUN: 36 mg/dL — ABNORMAL HIGH (ref 8–23)
CO2: 21 mmol/L — ABNORMAL LOW (ref 22–32)
Calcium: 6.8 mg/dL — ABNORMAL LOW (ref 8.9–10.3)
Chloride: 114 mmol/L — ABNORMAL HIGH (ref 98–111)
Creatinine, Ser: 2.04 mg/dL — ABNORMAL HIGH (ref 0.44–1.00)
GFR calc Af Amer: 29 mL/min — ABNORMAL LOW (ref >60)
GFR calc non Af Amer: 25 mL/min — ABNORMAL LOW (ref >60)
Glucose, Bld: 144 mg/dL — ABNORMAL HIGH (ref 70–99)
Potassium: 4 mmol/L (ref 3.5–5.1)
Sodium: 144 mmol/L (ref 135–145)

## 2020-04-21 LAB — PHOSPHORUS: Phosphorus: 3.8 mg/dL (ref 2.5–4.6)

## 2020-04-21 LAB — GLUCOSE, CAPILLARY
Glucose-Capillary: 124 mg/dL — ABNORMAL HIGH (ref 70–99)
Glucose-Capillary: 126 mg/dL — ABNORMAL HIGH (ref 70–99)
Glucose-Capillary: 128 mg/dL — ABNORMAL HIGH (ref 70–99)
Glucose-Capillary: 135 mg/dL — ABNORMAL HIGH (ref 70–99)
Glucose-Capillary: 138 mg/dL — ABNORMAL HIGH (ref 70–99)
Glucose-Capillary: 96 mg/dL (ref 70–99)

## 2020-04-21 LAB — CBC
HCT: 28.4 % — ABNORMAL LOW (ref 36.0–46.0)
Hemoglobin: 9.5 g/dL — ABNORMAL LOW (ref 12.0–15.0)
MCH: 30.2 pg (ref 26.0–34.0)
MCHC: 33.5 g/dL (ref 30.0–36.0)
MCV: 90.2 fL (ref 80.0–100.0)
Platelets: 110 10*3/uL — ABNORMAL LOW (ref 150–400)
RBC: 3.15 MIL/uL — ABNORMAL LOW (ref 3.87–5.11)
RDW: 16.1 % — ABNORMAL HIGH (ref 11.5–15.5)
WBC: 12.4 10*3/uL — ABNORMAL HIGH (ref 4.0–10.5)
nRBC: 0 % (ref 0.0–0.2)

## 2020-04-21 LAB — MAGNESIUM: Magnesium: 2.1 mg/dL (ref 1.7–2.4)

## 2020-04-21 MED ORDER — FUROSEMIDE 10 MG/ML IJ SOLN
40.0000 mg | Freq: Once | INTRAMUSCULAR | Status: AC
  Administered 2020-04-21: 40 mg via INTRAVENOUS
  Filled 2020-04-21: qty 4

## 2020-04-21 MED ORDER — ORAL CARE MOUTH RINSE
15.0000 mL | Freq: Two times a day (BID) | OROMUCOSAL | Status: DC
  Administered 2020-04-21 – 2020-04-28 (×13): 15 mL via OROMUCOSAL

## 2020-04-21 MED ORDER — ALBUTEROL SULFATE (2.5 MG/3ML) 0.083% IN NEBU
2.5000 mg | INHALATION_SOLUTION | Freq: Two times a day (BID) | RESPIRATORY_TRACT | Status: DC
  Administered 2020-04-21 – 2020-04-27 (×9): 2.5 mg via RESPIRATORY_TRACT
  Filled 2020-04-21 (×14): qty 3

## 2020-04-21 MED ORDER — NICOTINE 14 MG/24HR TD PT24
14.0000 mg | MEDICATED_PATCH | Freq: Every day | TRANSDERMAL | Status: DC
  Administered 2020-04-21 – 2020-04-29 (×9): 14 mg via TRANSDERMAL
  Filled 2020-04-21 (×9): qty 1

## 2020-04-21 NOTE — Progress Notes (Signed)
NAME:  Tonya Hughes, MRN:  [id number], DOB:  01-12-1951, LOS: 3 ADMISSION DATE:  04/18/2020, CONSULTATION DATE: 04/18/2020 REFERRING MD: Servando Snare, MD, CHIEF COMPLAINT: Shock, postop aortobifem.  Brief History   This is a 69 year old female with significant coronary disease and peripheral vascular disease.  Also has chronic systolic heart failure.  Patient was taken to the operating room today for an elective aortobifemoral bypass.  During operation patient had significant blood loss and decision was made for transfer to the intensive care unit as she received product, fluid resuscitation with minimal urine output.  Patient was left intubated and transferred to the ICU.  Pulmonary was consulted for recommendations of management.    Past Medical History   Past Medical History:  Diagnosis Date  . Arthritis    HANDS  . Back pain   . Congestive heart failure (CHF) ([place])   . Depressive disorder   . Diabetes mellitus without complication ([place])    new, A1c 6.9% 04/2020  . DVT (deep venous thrombosis) ([place])    Right LE s/p back surgery stop date lovenox 09/05/17  . Dyspnea   . Gastritis   . GERD (gastroesophageal reflux disease)   . Hypercholesteremia   . Hypertension   . Pneumonia      Significant Hospital Events   04/18/2020: ICU admission  Consults:  Vascular surgery Pulmonary critical care  Procedures:  6/172021: Elective aortobifemoral bypass 6/18 >> flex sig -severe ischemic changes from distal rectum onwards, not frank gangrene ETT 6/18 >>  Significant Diagnostic Tests:  04/15/2020: Nuclear medicine cardiac imaging, LVEF 44%  Micro Data:    Antimicrobials:  Vancomycin x1, per-op 6/18 cipro >> 6/18 flagyl >>  Interim history/subjective:    Critically ill intubated , but appears to be improving. On lower dose of Levophed. Good urine output with Lasix. Febrile 102.8, defervesced this morning    Objective   Blood pressure (!) 114/48, pulse 73, temperature  98.5 F (36.9 C), temperature source Oral, resp. rate 15, height 5' 3"  (1.6 m), weight 88.7 kg, SpO2 99 %. CVP:  [2 mmHg-34 mmHg] 9 mmHg  Vent Mode: PRVC FiO2 (%):  [30 %-40 %] 40 % Set Rate:  [18 bmp] 18 bmp Vt Set:  [410 mL] 410 mL PEEP:  [5 cmH20] 5 cmH20 Pressure Support:  [10 cmH20] 10 cmH20 Plateau Pressure:  [13 cmH20-20 cmH20] 20 cmH20   Intake/Output Summary (Last 24 hours) at 04/21/2020 1123 Last data filed at 04/21/2020 1000 Gross per 24 hour  Intake 2341.55 ml  Output 3010 ml  Net -668.45 ml   Filed Weights   04/19/20 0500 04/20/20 0145 04/21/20 0317  Weight: 87.1 kg 89.4 kg 88.7 kg    Examination: General: Elderly woman lying in bed in no distress HEENT: ETT, MM pink/moist, PERRL, sclera nonicteric Neuro: Calmer on Precedex, follows one-step commands CV: s1s2 regular rate and rhythm, no murmur, rubs, or gallops,  PULM: Bilateral vented breath sounds, no rhonchi, no accessory muscle use  GI: soft, bowel sounds active in all 4 quadrants, non-tender, non-distended Extremities: warm/dry, no edema  Skin: no rashes or lesions  Chest x-ray 6/ 20 personally reviewed shows bilateral vascular congestion Labs show plateauing of creatinine at 2.0, normal electrolytes, lactate decreased, decreasing leukocytosis, hemoglobin improved from 7.2-9.5 after 1 unit PRBC  Resolved Hospital Problem list     Assessment & Plan:  Course complicated by blood loss anemia and ischemic bowel   Acute respiratory failure.  Perioperatively - Patient postoperatively was left intubated as  she received significant blood product transfusion as well as crystalloid.   P:  Spontaneous breathing trials with goal extubation -Can DC Precedex post extubation  Hemorrhagic shock , improving -Estimated blood loss 1800 cc per operative report Chronic systolic heart failure -Home medications include carvedilol, Lasix, lisinopril, and Paxil P: Hopeful that she will come off Levophed MAP goal greater  than 65 Can resume goal-directed heart failure regiment once off vasopressors  Acute blood loss anemia, postop blood loss -Related to surgery - 4 units of PRBC and 2 of FFP so far P: No further evidence of blood loss    Acute Kidney Injury  -in the setting of hypovolemic shock. Baseline creatinine 0.8-0.9, creatinine currently 1.68 P: Diuresis with Lasix 40 again for extubation Trend Bmet Avoid nephrotoxins, ensure adequate renal perfusion    Ischemic bowel -Conservative care per vascular and GI -Hold off tube feeds for now  Smoker  -Likely underlying COPD P: Continue scheduled nebs Supportive care   Leukocytosis -Likely reactive from recent surgery in hemorrhagic shock , improving P: On empiric antibiotics per primary service  Best practice:  Diet: N.p.o. Pain/Anxiety/Delirium protocol (if indicated): Precedex VAP protocol (if indicated): yes DVT prophylaxis: heparn GI prophylaxis: H2b Glucose control: n/a Mobility: BR Code Status: FULL Family Communication: per primary  Disposition:   The patient is critically ill with multiple organ systems failure and requires high complexity decision making for assessment and support, frequent evaluation and titration of therapies, application of advanced monitoring technologies and extensive interpretation of multiple databases. Critical Care Time devoted to patient care services described in this note independent of APP/resident  time is 32 minutes.    Kara Mead MD. Shade Flood. [facility]  If no response to pager , please call [phone]   04/21/2020

## 2020-04-21 NOTE — Progress Notes (Addendum)
2 Days Post-Op  Subjective:  Feels ok this morning.  Denies abdominal pain unless you palpate of which she says that pain is better.  RN states no BMs  ROS: unable, on vent  Objective: Vital signs in last 24 hours: Temp:  [98.2 F (36.8 C)-102.8 F (39.3 C)] 98.5 F (36.9 C) (06/20 0715) Pulse Rate:  [31-83] 69 (06/20 0800) Resp:  [16-33] 19 (06/20 0800) BP: (97-156)/(42-81) 113/52 (06/20 0800) SpO2:  [85 %-100 %] 99 % (06/20 0800) FiO2 (%):  [30 %-40 %] 40 % (06/20 0349) Weight:  [88.7 kg] 88.7 kg (06/20 0317) Last BM Date: 04/19/20  Intake/Output from previous day: 06/19 0701 - 06/20 0700 In: 2365.9 [I.V.:1109.8; Blood:706; IV Piggyback:550.1] Out: 7412 [Urine:3440] Intake/Output this shift: Total I/O In: 51.3 [I.V.:51.3] Out: 115 [Urine:115]  PE: Abd: soft, mildly tender in lower abdomen, but nods that this is improving, few BS, NGT with some bilious output.  No guarding or peritoneal signs  Lab Results:  Recent Labs    04/20/20 0438 04/20/20 0438 04/20/20 1541 04/21/20 0321  WBC 13.6*  --   --  12.4*  HGB 7.2*   < > 9.9* 9.5*  HCT 22.9*   < > 30.1* 28.4*  PLT 126*  --   --  110*   < > = values in this interval not displayed.   BMET Recent Labs    04/20/20 0438 04/21/20 0321  NA 141 144  K 4.5 4.0  CL 112* 114*  CO2 20* 21*  GLUCOSE 181* 144*  BUN 37* 36*  CREATININE 2.13* 2.04*  CALCIUM 6.6* 6.8*   PT/INR Recent Labs    04/18/20 1648  LABPROT 17.3*  INR 1.5*   CMP     Component Value Date/Time   NA 144 04/21/2020 0321   K 4.0 04/21/2020 0321   CL 114 (H) 04/21/2020 0321   CO2 21 (L) 04/21/2020 0321   GLUCOSE 144 (H) 04/21/2020 0321   BUN 36 (H) 04/21/2020 0321   CREATININE 2.04 (H) 04/21/2020 0321   CALCIUM 6.8 (L) 04/21/2020 0321   PROT 4.5 (L) 04/19/2020 0421   ALBUMIN 3.2 (L) 04/19/2020 0421   AST 92 (H) 04/19/2020 0421   ALT 49 (H) 04/19/2020 0421   ALKPHOS 32 (L) 04/19/2020 0421   BILITOT 0.8 04/19/2020 0421   GFRNONAA  25 (L) 04/21/2020 0321   GFRAA 29 (L) 04/21/2020 0321   Lipase  No results found for: LIPASE     Studies/Results: CT ABDOMEN PELVIS W CONTRAST  Result Date: 04/20/2020 CLINICAL DATA:  69 year old female with a history of abdominal pain and fever, status post aorto iliac surgery EXAM: CT ABDOMEN AND PELVIS WITH CONTRAST TECHNIQUE: Multidetector CT imaging of the abdomen and pelvis was performed using the standard protocol following bolus administration of intravenous contrast. CONTRAST:  1m OMNIPAQUE IOHEXOL 300 MG/ML  SOLN COMPARISON:  CT 03/21/2020 FINDINGS: Lower chest: Small pleural effusions of the bilateral lung bases with associated atelectasis. Hepatobiliary: Unremarkable appearance of the liver. Cholecystectomy. Pancreas: Unremarkable Spleen: Unremarkable Adrenals/Urinary Tract: - Right adrenal gland:  Unremarkable - Left adrenal gland: Unremarkable. - Right kidney: Relatively symmetric perfusion to the left kidney. No hydronephrosis. Unremarkable right ureter. Vascular calcifications in the hilum of the right kidney. No focal lesion. - Left Kidney: Relatively symmetric perfusion to the right kidney. No hydronephrosis. No focal lesion. - Urinary Bladder: Urinary bladder relatively decompressed with a balloon retention catheter in place. Stomach/Bowel: - Stomach: Gastric tube terminates within the stomach. Stomach is  decompressed. - Small bowel:Small bowel appears relatively unremarkable without distention, air-fluid levels, or transition. No focal wall thickening - Appendix: Appendix is not visualized, however, no inflammatory changes are present adjacent to the cecum to indicate an appendicitis. - Colon: Proximal colon is decompressed with small stool burden. Mucosa enhances of the right colon transverse colon to the splenic flexure. At the splenic flexure and through the descending colon there is mild circumferential wall thickening with decreased attenuation of the colon wall, particularly  within the sigmoid colon on image 64. Decreased attenuation extends through the rectum. No significant distention. No perforation. Vascular/Lymphatic: Surgical changes of aorto bi-iliac bypass. The proximal anastomosis is not well visualized given the streak artifact, although just beyond the anastomosis the graft is patent throughout its length. Both the left and the right limb of the graft are patent to the distal anastomosis. The proximal femoral vasculature remains patent. Study is nondiagnostic for confident evaluation of any stenosis at the proximal surgical anastomosis. Atherosclerotic changes of the celiac artery origin and the SMA artery origin, better characterized on prior CT angiogram. Atherosclerotic changes at the bilateral renal artery origins, better characterized on prior CT angiogram. Reproductive: Hysterectomy. Other: Postsurgical changes of the midline abdomen. Surgical changes in the periaortic region without evidence of extravasation of contrast or blood products. Trace fluid in the right pericolic gutter. Trace fluid in the anatomic pelvis. Musculoskeletal: Thoracolumbar fixation of T10-S1. This contributes to significant streak artifact limiting evaluation of the canal, and the retroperitoneum. No acute displaced fracture. IMPRESSION: Interval surgical changes of aorto bi-iliac bypass. Although the proximal bypass anastomosis is not well evaluated given the protocol and the significant streak artifact, the bypass appears patent, including the proximal aortic limb, and the bilateral iliac limbs. Proximal femoral vasculature appears patent. Decreased attenuation/enhancement of the left colon extending from the splenic flexure through the sigmoid colon and rectum, concerning for decreased perfusion/ischemia. No evidence of perforation. Prelim results were discussed at the time of interpretation on 04/20/2020 at 2:22 pm with Dr. Donzetta Matters. CT again demonstrates mesenteric and renal arterial disease,  better characterized on the previous CT angiogram 03/21/2020. Aortic Atherosclerosis (ICD10-I70.0). Relatively symmetric perfusion of the kidneys. Additional ancillary findings as above. Signed, Dulcy Fanny. Dellia Nims, RPVI [facility] Electronically Signed   By: Corrie Mckusick D.O.   On: 04/20/2020 14:24    Anti-infectives: Anti-infectives (From admission, onward)   Start     Dose/Rate Route Frequency Ordered Stop   04/20/20 1600  ciprofloxacin (CIPRO) IVPB 400 mg     Discontinue     400 mg 200 mL/hr over 60 Minutes Intravenous Every 24 hours 04/19/20 2132     04/19/20 1800  metroNIDAZOLE (FLAGYL) IVPB 500 mg  Status:  Discontinued        500 mg 100 mL/hr over 60 Minutes Intravenous Every 6 hours 04/19/20 1530 04/19/20 1549   04/19/20 1700  metroNIDAZOLE (FLAGYL) IVPB 500 mg     Discontinue     500 mg 100 mL/hr over 60 Minutes Intravenous Every 8 hours 04/19/20 1549     04/19/20 1630  ciprofloxacin (CIPRO) IVPB 400 mg  Status:  Discontinued        400 mg 200 mL/hr over 60 Minutes Intravenous Every 12 hours 04/19/20 1530 04/19/20 2132   04/18/20 0743  vancomycin (VANCOCIN) IVPB 1000 mg/200 mL premix        1,000 mg 200 mL/hr over 60 Minutes Intravenous 60 min pre-op 04/18/20 0743 04/18/20 1900  Assessment/Plan Ischemic colitis of the left/sigmoid colon and proximal rectum - likely chronic disease exacerbated by recent surgical bypass with ligation of the IMA.  -no further bloody BMs at this time  -hgb stable  -WBC down slightly  -temp overnight, unclear if that's related to above etiology  -continue conservative management.  No needs for acute surgical intervention at this time unless she worsens and develops obvious acute abdomen c/w ischemia.  -will follow  FEN - NGT/IVFs VTE - on hold due to bloody BM 6/18 ID - C/F   LOS: 3 days    Henreitta Cea , [facility] 04/21/2020, 8:30 AM Please see  Amion for pager number during day hours 7:00am-4:30pm or 7:00am -11:30am on weekends  Agree with above. Extubated and in chair.  Has NGT.  Sucking on lollipop sponge.  Alphonsa Overall, MD, [facility] Surgery Office phone:  [phone]

## 2020-04-21 NOTE — Progress Notes (Signed)
°  Progress Note    04/21/2020 9:54 AM 2 Days Post-Op  Subjective: Febrile overnight otherwise she has been stable Levophed requirement decreasing  Vitals:   04/21/20 0915 04/21/20 0930  BP: (!) 135/45 (!) 114/48  Pulse: 83 73  Resp: (!) 26 15  Temp:    SpO2: 100% 99%    Physical Exam: She arouses with stimulation remains intubated Abdomen is soft she does have discomfort to palpation in the left lower quadrant Bilateral groins incisions clean dry intact Bilateral feet are warm today there are easily identified dorsalis pedis signal on the left and posterior tibial signal on the right that are monophasic  CBC    Component Value Date/Time   WBC 12.4 (H) 04/21/2020 0321   RBC 3.15 (L) 04/21/2020 0321   HGB 9.5 (L) 04/21/2020 0321   HCT 28.4 (L) 04/21/2020 0321   PLT 110 (L) 04/21/2020 0321   MCV 90.2 04/21/2020 0321   MCH 30.2 04/21/2020 0321   MCHC 33.5 04/21/2020 0321   RDW 16.1 (H) 04/21/2020 0321   LYMPHSABS 1.3 04/19/2020 1549   MONOABS 1.1 (H) 04/19/2020 1549   EOSABS 0.1 04/19/2020 1549   BASOSABS 0.1 04/19/2020 1549    BMET    Component Value Date/Time   NA 144 04/21/2020 0321   K 4.0 04/21/2020 0321   CL 114 (H) 04/21/2020 0321   CO2 21 (L) 04/21/2020 0321   GLUCOSE 144 (H) 04/21/2020 0321   BUN 36 (H) 04/21/2020 0321   CREATININE 2.04 (H) 04/21/2020 0321   CALCIUM 6.8 (L) 04/21/2020 0321   GFRNONAA 25 (L) 04/21/2020 0321   GFRAA 29 (L) 04/21/2020 0321    INR    Component Value Date/Time   INR 1.5 (H) 04/18/2020 1648     Intake/Output Summary (Last 24 hours) at 04/21/2020 0954 Last data filed at 04/21/2020 0800 Gross per 24 hour  Intake 2417.16 ml  Output 3420 ml  Net -1002.84 ml     Assessment:  69 y.o. female is s/p aortobifemoral bypass complicated by left colon and sigmoid ischemic colitis considered acute on chronic secondary to ligation of the IMA.  Improved signals in her feet today.  Plan: -Vascular:improved signals in feet  today, graft patent by CT yesterday -Cardiac: levophed requirement decreasing -Pulmonary: CCM following and input much appreciated  -Neuro: She is sedated on Precedex easily arousable -Renal: Creatinine peaked above 2, she has had adequate uop overnight -GI:  Appreciate input from general surgery.  Leukocytosis continues to improve abdominal pain also improving. -Heme/ID: Continue IV antibiotics for concern of colonic ischemia.    H&H stable after transfusion yesterday.  I have called to update her husband this morning.   Robbi Spells C. Donzetta Matters, MD [facility] [place] Office: [phone] Pager: [phone]  04/21/2020 9:54 AM

## 2020-04-21 NOTE — Progress Notes (Signed)
Attempted to call pt's son. The pt's son did not answer the phone and the voicemail for the son's phone is not set up. Unable to leave voicemail at this time, will attempt to reach son again.

## 2020-04-21 NOTE — Procedures (Signed)
Extubation Procedure Note  Patient Details:   Name: Tonya Hughes DOB: [birth year] MRN: [id number]   Airway Documentation:    Vent end date: 04/21/20 Vent end time: 1150   Evaluation  O2 sats: stable throughout Complications: No apparent complications Patient did tolerate procedure well. Bilateral Breath Sounds: Diminished, Fine crackles   Yes   Pt extubated per physician order. Pt suctioned via ETT and orally prior. Pt with positive cuff leak. Upon extubation pt placed on 3L nasal cannula with humidity. Pt able to speak name, give a good cough and no stridor was heard. Incentive spirometer was given to pt also.   Sharla Kidney 04/21/2020, 11:54 AM

## 2020-04-21 NOTE — Progress Notes (Signed)
PT Cancellation Note  Patient Details Name: Tonya Hughes MRN: [id number] DOB: [birth year]   Cancelled Treatment:    Reason Eval/Treat Not Completed: Patient not medically ready. Pt remains intubated.   Shary Decamp [facility] 04/21/2020, 10:36 AM [place] Pager [phone] Office [phone]

## 2020-04-22 ENCOUNTER — Inpatient Hospital Stay (HOSPITAL_COMMUNITY): Payer: Medicare Other

## 2020-04-22 LAB — BPAM RBC
Blood Product Expiration Date: 202106282359
Blood Product Expiration Date: 202106302359
Blood Product Expiration Date: 202106302359
Blood Product Expiration Date: 202106302359
Blood Product Expiration Date: 202107012359
Blood Product Expiration Date: 202107032359
Blood Product Expiration Date: 202107032359
Blood Product Expiration Date: 202107052359
ISSUE DATE / TIME: 202106171140
ISSUE DATE / TIME: 202106171140
ISSUE DATE / TIME: 202106171140
ISSUE DATE / TIME: 202106181033
ISSUE DATE / TIME: 202106190845
ISSUE DATE / TIME: 202106191156
Unit Type and Rh: 600
Unit Type and Rh: 600
Unit Type and Rh: 600
Unit Type and Rh: 600
Unit Type and Rh: 600
Unit Type and Rh: 600
Unit Type and Rh: 600
Unit Type and Rh: 600

## 2020-04-22 LAB — TYPE AND SCREEN
ABO/RH(D): A NEG
Antibody Screen: NEGATIVE
Unit division: 0
Unit division: 0
Unit division: 0
Unit division: 0
Unit division: 0
Unit division: 0
Unit division: 0
Unit division: 0

## 2020-04-22 LAB — GLUCOSE, CAPILLARY
Glucose-Capillary: 116 mg/dL — ABNORMAL HIGH (ref 70–99)
Glucose-Capillary: 116 mg/dL — ABNORMAL HIGH (ref 70–99)
Glucose-Capillary: 101 mg/dL — ABNORMAL HIGH (ref 70–99)
Glucose-Capillary: 113 mg/dL — ABNORMAL HIGH (ref 70–99)
Glucose-Capillary: 115 mg/dL — ABNORMAL HIGH (ref 70–99)
Glucose-Capillary: 81 mg/dL (ref 70–99)

## 2020-04-22 LAB — BASIC METABOLIC PANEL
Anion gap: 9 (ref 5–15)
BUN: 39 mg/dL — ABNORMAL HIGH (ref 8–23)
CO2: 21 mmol/L — ABNORMAL LOW (ref 22–32)
Calcium: 7.1 mg/dL — ABNORMAL LOW (ref 8.9–10.3)
Chloride: 116 mmol/L — ABNORMAL HIGH (ref 98–111)
Creatinine, Ser: 1.91 mg/dL — ABNORMAL HIGH (ref 0.44–1.00)
GFR calc Af Amer: 31 mL/min — ABNORMAL LOW (ref >60)
GFR calc non Af Amer: 27 mL/min — ABNORMAL LOW (ref >60)
Glucose, Bld: 122 mg/dL — ABNORMAL HIGH (ref 70–99)
Potassium: 4.1 mmol/L (ref 3.5–5.1)
Sodium: 146 mmol/L — ABNORMAL HIGH (ref 135–145)

## 2020-04-22 LAB — CBC
HCT: 26.8 % — ABNORMAL LOW (ref 36.0–46.0)
Hemoglobin: 8.4 g/dL — ABNORMAL LOW (ref 12.0–15.0)
MCH: 29.2 pg (ref 26.0–34.0)
MCHC: 31.3 g/dL (ref 30.0–36.0)
MCV: 93.1 fL (ref 80.0–100.0)
Platelets: 131 10*3/uL — ABNORMAL LOW (ref 150–400)
RBC: 2.88 MIL/uL — ABNORMAL LOW (ref 3.87–5.11)
RDW: 16.6 % — ABNORMAL HIGH (ref 11.5–15.5)
WBC: 12.6 10*3/uL — ABNORMAL HIGH (ref 4.0–10.5)
nRBC: 0 % (ref 0.0–0.2)

## 2020-04-22 LAB — MAGNESIUM: Magnesium: 2.1 mg/dL (ref 1.7–2.4)

## 2020-04-22 LAB — PHOSPHORUS: Phosphorus: 4.2 mg/dL (ref 2.5–4.6)

## 2020-04-22 MED ORDER — DIPHENHYDRAMINE HCL 50 MG/ML IJ SOLN
25.0000 mg | Freq: Four times a day (QID) | INTRAMUSCULAR | Status: DC | PRN
  Administered 2020-04-23 – 2020-04-27 (×2): 25 mg via INTRAVENOUS
  Filled 2020-04-22 (×2): qty 1

## 2020-04-22 MED ORDER — DEXTROSE 5 % IV SOLN
500.0000 mg | Freq: Once | INTRAVENOUS | Status: AC
  Administered 2020-04-22: 500 mg via INTRAVENOUS
  Filled 2020-04-22: qty 500

## 2020-04-22 NOTE — Progress Notes (Addendum)
Progress Note    04/22/2020 7:37 AM 3 Days Post-Op  Subjective:  Awake, alert, not c0-operating with exam and fussing at her attendant RN. Extubated yesterday Off Levophed at approx 5pm yesterday   Vitals:   04/22/20 0700 04/22/20 0727  BP: (!) 104/45   Pulse: 69   Resp: 18   Temp:  98.1 F (36.7 C)  SpO2: 98%     Physical Exam: Cardiac:  RRR Lungs:  nonlabored Incisions:  Well approximated.  No drainage Extremities:  Moves all well. Feet warm.+Doppler signals Abdomen:  Soft, no peritoneal signs  CBC    Component Value Date/Time   WBC 12.6 (H) 04/22/2020 0400   RBC 2.88 (L) 04/22/2020 0400   HGB 8.4 (L) 04/22/2020 0400   HCT 26.8 (L) 04/22/2020 0400   PLT 131 (L) 04/22/2020 0400   MCV 93.1 04/22/2020 0400   MCH 29.2 04/22/2020 0400   MCHC 31.3 04/22/2020 0400   RDW 16.6 (H) 04/22/2020 0400   LYMPHSABS 1.3 04/19/2020 1549   MONOABS 1.1 (H) 04/19/2020 1549   EOSABS 0.1 04/19/2020 1549   BASOSABS 0.1 04/19/2020 1549    BMET    Component Value Date/Time   NA 146 (H) 04/22/2020 0400   K 4.1 04/22/2020 0400   CL 116 (H) 04/22/2020 0400   CO2 21 (L) 04/22/2020 0400   GLUCOSE 122 (H) 04/22/2020 0400   BUN 39 (H) 04/22/2020 0400   CREATININE 1.91 (H) 04/22/2020 0400   CALCIUM 7.1 (L) 04/22/2020 0400   GFRNONAA 27 (L) 04/22/2020 0400   GFRAA 31 (L) 04/22/2020 0400     Intake/Output Summary (Last 24 hours) at 04/22/2020 0737 Last data filed at 04/22/2020 0700 Gross per 24 hour  Intake 1278.12 ml  Output 1860 ml  Net -581.88 ml   Urine output 1660 cc NG tube output 200 cc BM yesterday HOSPITAL MEDICATIONS Scheduled Meds: . sodium chloride   Intravenous Once  . albuterol  2.5 mg Nebulization BID  . arformoterol  15 mcg Nebulization BID  . budesonide (PULMICORT) nebulizer solution  0.5 mg Nebulization BID  . Chlorhexidine Gluconate Cloth  6 each Topical Daily  . heparin  5,000 Units Subcutaneous Q8H  . insulin aspart  0-15 Units Subcutaneous Q4H  .  mouth rinse  15 mL Mouth Rinse BID  . nicotine  14 mg Transdermal Daily  . sodium chloride flush  10-40 mL Intracatheter Q12H   Continuous Infusions: . sodium chloride    . sodium chloride 10 mL/hr at 04/19/20 2200  . sodium chloride Stopped (04/18/20 1739)  . sodium chloride 20 mL/hr at 04/22/20 0700  . chlorothiazide (DIURIL) IV    . ciprofloxacin Stopped (04/21/20 1820)  . dexmedetomidine (PRECEDEX) IV infusion 1.1 mcg/kg/hr (04/22/20 0700)  . famotidine (PEPCID) IV Stopped (04/21/20 2255)  . metronidazole 500 mg (04/22/20 0116)  . norepinephrine (LEVOPHED) Adult infusion Stopped (04/21/20 1716)   PRN Meds:.sodium chloride, acetaminophen **OR** acetaminophen, alum & mag hydroxide-simeth, bisacodyl, docusate sodium, fentaNYL (SUBLIMAZE) injection, guaiFENesin-dextromethorphan, hydrALAZINE, labetalol, metoprolol tartrate, morphine injection, ondansetron, oxyCODONE, phenol, polyethylene glycol, potassium chloride, sodium chloride flush  Assessment: 69 y.o. female is s/p aortobifemoral bypass complicated by left colon and sigmoid ischemic colitis considered acute on chronic secondary to ligation of the IMA.    Plan: -Vascular: signals in feet today, graft patent by CT Saturday -Cardiac:off levophed -Pulmonary:extubated -Neuro:acute delirium -Renal:Creatinine peaked above 2, she has had adequate uop overnight -GI: Appreciate input from general surgery.  Leukocytosis essentially unchanged. Keep strict NPO  -Heme/ID:Continue IV  antibiotics for concern of colonic ischemia.  Hemoglobin down one gram. Transfused Saturday. No reports of bloody stool. Continue to monitor -Per Dr. Donzetta Matters -DVT prophylaxis:  Heparin [place]   Risa Grill, PA-C [facility] [phone] 04/22/2020  7:37 AM   I have independently interviewed and examined patient and agree with PA assessment and plan above.  Patient is quite upset about not being able to eat.  Overall her  abdominal pain is much improved she is extubated off of Levophed.  General surgery input as well as GI evaluation much appreciated.  She is okay for transfer to the floor today.  Jourden Gilson C. Donzetta Matters, MD [facility] [place] Office: [phone] Pager: [phone]

## 2020-04-22 NOTE — Progress Notes (Signed)
Inpatient Rehab Admissions Coordinator Note:   Per PT/OT recommendations, pt was screened for CIR candidacy by Gayland Curry, MS, CCC-SLP.  At this time we are recommending an Inpatient Rehab consult.  AC will contact attending to request consult order.  Please contact me with questions.    Gayland Curry, [place], [place] Admissions Coordinator [phone] 04/22/20 3:39 PM

## 2020-04-22 NOTE — Progress Notes (Signed)
3 Days Post-Op   Subjective/Chief Complaint: Complains of feeling hungry   Objective: Vital signs in last 24 hours: Temp:  [98.1 F (36.7 C)-99.3 F (37.4 C)] 98.1 F (36.7 C) (06/21 0727) Pulse Rate:  [66-86] 67 (06/21 0816) Resp:  [13-31] 18 (06/21 0816) BP: (93-149)/(34-116) 129/50 (06/21 0816) SpO2:  [92 %-100 %] 98 % (06/21 0700) Weight:  [85.8 kg] 85.8 kg (06/21 0500) Last BM Date: 04/19/20  Intake/Output from previous day: 06/20 0701 - 06/21 0700 In: 1278.1 [I.V.:917.3; IV Piggyback:360.8] Out: 1860 [VHQIO:9629; Emesis/NG output:200] Intake/Output this shift: No intake/output data recorded.  General appearance: alert and cooperative Resp: clear to auscultation bilaterally Cardio: regular rate and rhythm GI: soft, minimal tenderness. quiet. incision looks good  Lab Results:  Recent Labs    04/21/20 0321 04/22/20 0400  WBC 12.4* 12.6*  HGB 9.5* 8.4*  HCT 28.4* 26.8*  PLT 110* 131*   BMET Recent Labs    04/21/20 0321 04/22/20 0400  NA 144 146*  K 4.0 4.1  CL 114* 116*  CO2 21* 21*  GLUCOSE 144* 122*  BUN 36* 39*  CREATININE 2.04* 1.91*  CALCIUM 6.8* 7.1*   PT/INR No results for input(s): LABPROT, INR in the last 72 hours. ABG No results for input(s): PHART, HCO3 in the last 72 hours.  Invalid input(s): PCO2, PO2  Studies/Results: CT ABDOMEN PELVIS W CONTRAST  Result Date: 04/20/2020 CLINICAL DATA:  69 year old female with a history of abdominal pain and fever, status post aorto iliac surgery EXAM: CT ABDOMEN AND PELVIS WITH CONTRAST TECHNIQUE: Multidetector CT imaging of the abdomen and pelvis was performed using the standard protocol following bolus administration of intravenous contrast. CONTRAST:  81m OMNIPAQUE IOHEXOL 300 MG/ML  SOLN COMPARISON:  CT 03/21/2020 FINDINGS: Lower chest: Small pleural effusions of the bilateral lung bases with associated atelectasis. Hepatobiliary: Unremarkable appearance of the liver. Cholecystectomy. Pancreas:  Unremarkable Spleen: Unremarkable Adrenals/Urinary Tract: - Right adrenal gland:  Unremarkable - Left adrenal gland: Unremarkable. - Right kidney: Relatively symmetric perfusion to the left kidney. No hydronephrosis. Unremarkable right ureter. Vascular calcifications in the hilum of the right kidney. No focal lesion. - Left Kidney: Relatively symmetric perfusion to the right kidney. No hydronephrosis. No focal lesion. - Urinary Bladder: Urinary bladder relatively decompressed with a balloon retention catheter in place. Stomach/Bowel: - Stomach: Gastric tube terminates within the stomach. Stomach is decompressed. - Small bowel:Small bowel appears relatively unremarkable without distention, air-fluid levels, or transition. No focal wall thickening - Appendix: Appendix is not visualized, however, no inflammatory changes are present adjacent to the cecum to indicate an appendicitis. - Colon: Proximal colon is decompressed with small stool burden. Mucosa enhances of the right colon transverse colon to the splenic flexure. At the splenic flexure and through the descending colon there is mild circumferential wall thickening with decreased attenuation of the colon wall, particularly within the sigmoid colon on image 64. Decreased attenuation extends through the rectum. No significant distention. No perforation. Vascular/Lymphatic: Surgical changes of aorto bi-iliac bypass. The proximal anastomosis is not well visualized given the streak artifact, although just beyond the anastomosis the graft is patent throughout its length. Both the left and the right limb of the graft are patent to the distal anastomosis. The proximal femoral vasculature remains patent. Study is nondiagnostic for confident evaluation of any stenosis at the proximal surgical anastomosis. Atherosclerotic changes of the celiac artery origin and the SMA artery origin, better characterized on prior CT angiogram. Atherosclerotic changes at the bilateral renal  artery origins, better  characterized on prior CT angiogram. Reproductive: Hysterectomy. Other: Postsurgical changes of the midline abdomen. Surgical changes in the periaortic region without evidence of extravasation of contrast or blood products. Trace fluid in the right pericolic gutter. Trace fluid in the anatomic pelvis. Musculoskeletal: Thoracolumbar fixation of T10-S1. This contributes to significant streak artifact limiting evaluation of the canal, and the retroperitoneum. No acute displaced fracture. IMPRESSION: Interval surgical changes of aorto bi-iliac bypass. Although the proximal bypass anastomosis is not well evaluated given the protocol and the significant streak artifact, the bypass appears patent, including the proximal aortic limb, and the bilateral iliac limbs. Proximal femoral vasculature appears patent. Decreased attenuation/enhancement of the left colon extending from the splenic flexure through the sigmoid colon and rectum, concerning for decreased perfusion/ischemia. No evidence of perforation. Prelim results were discussed at the time of interpretation on 04/20/2020 at 2:22 pm with Dr. Donzetta Matters. CT again demonstrates mesenteric and renal arterial disease, better characterized on the previous CT angiogram 03/21/2020. Aortic Atherosclerosis (ICD10-I70.0). Relatively symmetric perfusion of the kidneys. Additional ancillary findings as above. Signed, Dulcy Fanny. Dellia Nims, RPVI [facility] Electronically Signed   By: Corrie Mckusick D.O.   On: 04/20/2020 14:24   DG Chest Port 1 View  Result Date: 04/21/2020 CLINICAL DATA:  69 year old female with respiratory failure EXAM: PORTABLE CHEST 1 VIEW COMPARISON:  04/19/2020 FINDINGS: Cardiomediastinal silhouette unchanged. Persistently low lung volumes with basilar opacities partially obscuring the heart borders and hemidiaphragms. Unchanged right IJ sheath. Endotracheal tube terminates 2.1 cm above  the carina, unchanged. Gastric tube unchanged. No pneumothorax. IMPRESSION: Persistently low lung volumes with persisting basilar opacities, potentially combination of atelectasis/consolidation, pleural fluid, and/or local edema. Unchanged right IJ central line. Endotracheal tube terminates 2.1 cm above the carina. Unchanged gastric tube. Electronically Signed   By: Corrie Mckusick D.O.   On: 04/21/2020 12:07    Anti-infectives: Anti-infectives (From admission, onward)   Start     Dose/Rate Route Frequency Ordered Stop   04/20/20 1600  ciprofloxacin (CIPRO) IVPB 400 mg     Discontinue     400 mg 200 mL/hr over 60 Minutes Intravenous Every 24 hours 04/19/20 2132     04/19/20 1800  metroNIDAZOLE (FLAGYL) IVPB 500 mg  Status:  Discontinued        500 mg 100 mL/hr over 60 Minutes Intravenous Every 6 hours 04/19/20 1530 04/19/20 1549   04/19/20 1700  metroNIDAZOLE (FLAGYL) IVPB 500 mg     Discontinue     500 mg 100 mL/hr over 60 Minutes Intravenous Every 8 hours 04/19/20 1549     04/19/20 1630  ciprofloxacin (CIPRO) IVPB 400 mg  Status:  Discontinued        400 mg 200 mL/hr over 60 Minutes Intravenous Every 12 hours 04/19/20 1530 04/19/20 2132   04/18/20 0743  vancomycin (VANCOCIN) IVPB 1000 mg/200 mL premix        1,000 mg 200 mL/hr over 60 Minutes Intravenous 60 min pre-op 04/18/20 0743 04/18/20 1900      Assessment/Plan: s/p Procedure(s) with comments: COLONOSCOPY (N/A) - bedside exam, pt on ventilator Continue ng and bowel rest for postop ileus  Ischemic colitis of the left/sigmoid colon and proximal rectum - likely chronic disease exacerbated by recent surgical bypass with ligation of the IMA.             -no further bloody BMs at this time             -hgb stable             -  WBC stable  -Continue abx             -continue conservative management.  No needs for acute surgical intervention at this time unless she worsens and develops obvious acute abdomen c/w ischemia.              -will follow  FEN - NGT/IVFs, allow ice chips for comfort VTE - on hold due to bloody BM 6/18 ID - C/F  LOS: 4 days    Autumn Messing III 04/22/2020

## 2020-04-22 NOTE — Progress Notes (Signed)
NAME:  Tonya Hughes, MRN:  [id number], DOB:  Jan 08, 1951, LOS: 4 ADMISSION DATE:  04/18/2020, CONSULTATION DATE: 04/18/2020 REFERRING MD: Servando Snare, MD, CHIEF COMPLAINT: Shock, postop aortobifem.  Brief History   This is a 69 year old female with significant coronary disease and peripheral vascular disease.  Also has chronic systolic heart failure.  Patient was taken to the operating room today for an elective aortobifemoral bypass.  During operation patient had significant blood loss and decision was made for transfer to the intensive care unit as she received product, fluid resuscitation with minimal urine output.  Patient was left intubated and transferred to the ICU.  Pulmonary was consulted for recommendations of management.  Past Medical History   Past Medical History:  Diagnosis Date  . Arthritis    HANDS  . Back pain   . Congestive heart failure (CHF) ([place])   . Depressive disorder   . Diabetes mellitus without complication ([place])    new, A1c 6.9% 04/2020  . DVT (deep venous thrombosis) ([place])    Right LE s/p back surgery stop date lovenox 09/05/17  . Dyspnea   . Gastritis   . GERD (gastroesophageal reflux disease)   . Hypercholesteremia   . Hypertension   . Pneumonia      Significant Hospital Events   04/18/2020: ICU admission  Consults:  Vascular surgery Pulmonary critical care  Procedures:  6/172021: Elective aortobifemoral bypass 6/18 >> flex sig -severe ischemic changes from distal rectum onwards, not frank gangrene ETT 6/18 >>6/20  Significant Diagnostic Tests:  04/15/2020: Nuclear medicine cardiac imaging, LVEF 44%  Micro Data:    Antimicrobials:  Vancomycin x1, per-op 6/18 cipro >> 6/18 flagyl >>  Interim history/subjective:  Extubated 6/20 She is pretty anxious and delirious this am.  Denies abd pain.  Objective   Blood pressure (!) 104/45, pulse 69, temperature 98.5 F (36.9 C), temperature source Oral, resp. rate 18, height 5' 3"  (1.6 m),  weight 85.8 kg, SpO2 98 %. CVP:  [2 mmHg-19 mmHg] 11 mmHg  Vent Mode: CPAP;PSV FiO2 (%):  [40 %] 40 % PEEP:  [5 cmH20] 5 cmH20 Pressure Support:  [8 cmH20] 8 cmH20 Plateau Pressure:  [20 cmH20] 20 cmH20   Intake/Output Summary (Last 24 hours) at 04/22/2020 0717 Last data filed at 04/22/2020 0700 Gross per 24 hour  Intake 1278.12 ml  Output 1860 ml  Net -581.88 ml   Filed Weights   04/20/20 0145 04/21/20 0317 04/22/20 0500  Weight: 89.4 kg 88.7 kg 85.8 kg    Examination: GEN: chronically ill woman in NAD HEENT: NGT in place with bilious output CV: RRR, ext warm PULM: Transmitted upper airway sounds, no wheezing GI: Soft, +BS EXT: Trace edema NEURO: Moves all 4 ext to command PSYCH: Anxious, AO to self and place but not situation SKIN: Slightly pale  Fever curve improved Sodium up BUN 36>>39 Renal function stable 2>>1.9 Hgb 9.5>>8.4 Plts improving Sugars good Net +7.5L this hospitalization, [facility] Problem list     Assessment & Plan:    Acute respiratory failure.  Perioperative. Resolved but lingering issues with atelectasis and poor mobility.  Patient postoperatively was left intubated as she received significant blood product transfusion as well as crystalloid.   - IS, up to chair, PT consult  Chronic systolic heart failure-Home medications include carvedilol, Lasix, lisinopril, and Paxil - Cannot resume in setting of lack of PO  Hemorrhagic shock, Acute blood loss anemia, postop blood loss.  Related to surgery.  4 units  of PRBC and 2 of FFP so far - monitor for s/s of further bleeding, has been steady so far  Acute Kidney Injury -in the setting of hypovolemic shock. Baseline creatinine 0.8-0.9, creatinine currently 1.9 - Avoid nephrotoxins - Diuril x 1  Ischemic bowel -Conservative care per vascular and surgery -Hold off tube feeds for now -Ice chips for comfort -? When/if will need TPN bridge  Smoker -Likely underlying  COPD - scheduled nebs  Leukocytosis-Likely reactive from recent surgery in hemorrhagic shock , improving -On empiric antibiotics per primary service  Delirium- mild, limited treatment options given lack of oral access.  Continue to re-orient as able.  Best practice:  Diet: N.p.o. Pain/Anxiety/Delirium protocol (if indicated): off VAP protocol (if indicated): yes DVT prophylaxis: heparn GI prophylaxis: H2b Glucose control: n/a Mobility: up to chair Code Status: FULL Family Communication: per primary  Disposition: ICU today, still a little tenuous from resp standpoint with this delirium, if looks stable tomorrow can do surgical stepdown  The patient is critically ill with multiple organ systems failure and requires high complexity decision making for assessment and support, frequent evaluation and titration of therapies, application of advanced monitoring technologies and extensive interpretation of multiple databases. Critical Care Time devoted to patient care services described in this note independent of APP/resident time (if applicable)  is 34 minutes.   Erskine Emery MD [facility] 04/22/2020 7:24 AM Personal pager: [phone] If unanswered, please page CCM On-call: [phone]

## 2020-04-22 NOTE — Progress Notes (Signed)
After discussion with family and further discussion with patient have determined she is actually not having delirium and is just angry.  She has been screaming at staff all day, per her sister this is fairly normal for her.  I have asked sister to come in.  Given this and stability off vent/pressors, she is stable for transfer to progressive.  Have called Dr. Donzetta Matters who is in agreement.  PCCM will sign off, call if we can be of further help.  Erskine Emery MD PCCM

## 2020-04-22 NOTE — Evaluation (Signed)
Physical Therapy Evaluation Patient Details Name: Tonya Hughes MRN: [id number] DOB: 01-Jun-1951 Today's Date: 04/22/2020   History of Present Illness  69 yo female with aortoiliac occulsive disease admitted for aortobifem BPG with significant blood loss operatively remained intubated post op. Ischemic colitis post op. Extubated 6/20. PMHx: CAD, PVD, systolic heart failure, HTN, DM  Clinical Impression  Pt supine on arrival perseverating on wanting ice, wet cloth and phone to call husband. On arrival pt stating desire to be OOB and get food. Pt without awareness of deficits or current ischemic colitis continually stating we don't understand her and that her stomach is fine and she needs food. Pt with decreased cognition, strength, mobility, transfers and function who will benefit from acute therapy to maximize mobility, safety and function.   BP supine 111/53 Sitting 118/55 In chair end of session 122/69 HR 72-74 SpO2 99% on 1L    Follow Up Recommendations CIR    Equipment Recommendations  3in1 (PT)    Recommendations for Other Services Rehab consult     Precautions / Restrictions Precautions Precautions: Fall Precaution Comments: NGtube      Mobility  Bed Mobility Overal bed mobility: Needs Assistance Bed Mobility: Rolling;Sidelying to Sit Rolling: Max assist;+2 for physical assistance Sidelying to sit: Max assist;+2 for physical assistance       General bed mobility comments: max multimodal cues, assist for LEs over EOB and trunk elevation, use of bedpad to support hips with rolling   Transfers Overall transfer level: Needs assistance Equipment used: Rolling walker (2 wheeled) Transfers: Sit to/from [facility] Sit to Stand: Max assist;+2 physical assistance Stand pivot transfers: Max assist;+2 physical assistance       General transfer comment: physical assist to rise from bed to RW with pt with incontinent liquid BM in standing with return to  sitting EOB for BSC. Pt then stood from bed and pivoted to [facility] with RW with max +2 assist. Max assist in standing for pericare with additional stand and pivot to recliner. Max multimodal cues and physical assist for hip extension and RW movement  Ambulation/Gait             General Gait Details: not yet able  Stairs            Wheelchair Mobility    Modified Rankin (Stroke Patients Only)       Balance Overall balance assessment: Needs assistance   Sitting balance-Leahy Scale: Fair Sitting balance - Comments: EOB with minguard assist   Standing balance support: Bilateral upper extremity supported Standing balance-Leahy Scale: Poor Standing balance comment: bil UE support on RW with physical assist for balance in standing                             Pertinent Vitals/Pain Faces Pain Scale: Hurts little more Pain Location: generalized Pain Descriptors / Indicators: Discomfort Pain Intervention(s): Limited activity within patient's tolerance;Monitored during session;Repositioned    Home Living Family/patient expects to be discharged to:: Private residence Living Arrangements: Spouse/significant other Available Help at Discharge: Family;Available PRN/intermittently Type of Home: House Home Access: Stairs to enter Entrance Stairs-Rails: Psychiatric nurse of Steps: 3 Home Layout: One level Home Equipment: Shower seat;Bedside commode;Walker - 4 wheels      Prior Function Level of Independence: Independent with assistive device(s)         Comments: using rollator, does her own bathing and dressing, drives and grocery shops a little bit  Hand Dominance        Extremity/Trunk Assessment   Upper Extremity Assessment Upper Extremity Assessment: Generalized weakness    Lower Extremity Assessment Lower Extremity Assessment: Generalized weakness    Cervical / Trunk Assessment Cervical / Trunk Assessment: Kyphotic   Communication   Communication: Expressive difficulties (mild)  Cognition Arousal/Alertness: Awake/alert Behavior During Therapy: Agitated;Impulsive Overall Cognitive Status: Impaired/Different from baseline Area of Impairment: Attention;Memory;Following commands;Safety/judgement;Awareness;Problem solving;Orientation                 Orientation Level: Disoriented to;Time Current Attention Level: Focused Memory: Decreased recall of precautions;Decreased short-term memory Following Commands: Follows one step commands inconsistently;Follows one step commands with increased time Safety/Judgement: Decreased awareness of safety;Decreased awareness of deficits Awareness: Intellectual Problem Solving: Slow processing;Decreased initiation;Difficulty sequencing;Requires verbal cues;Requires tactile cues General Comments: pt highly distractible, perseverating on needing to find a phone, wanting ice, having a washcloth, requires max cues for redirection and for safety due to impulsivity. Pt with poor insight into current status stating "my stomach is fine give me food"; easily agitated. Per family via RN when pt is hospitalized this is her cognition and spouse refusing to talk to her currently      General Comments      Exercises     Assessment/Plan    PT Assessment Patient needs continued PT services  PT Problem List Decreased strength;Decreased mobility;Decreased safety awareness;Decreased coordination;Decreased activity tolerance;Decreased cognition;Decreased balance;Decreased knowledge of use of DME       PT Treatment Interventions DME instruction;Therapeutic exercise;Gait training;Balance training;Functional mobility training;Cognitive remediation;Neuromuscular re-education;Therapeutic activities;Patient/family education    PT Goals (Current goals can be found in the Care Plan section)  Acute Rehab PT Goals Patient Stated Goal: get food and talk to my husband PT Goal Formulation:  With patient Time For Goal Achievement: 05/06/20 Potential to Achieve Goals: Fair    Frequency Min 3X/week   Barriers to discharge Decreased caregiver support      Co-evaluation PT/OT/SLP Co-Evaluation/Treatment: Yes Reason for Co-Treatment: Complexity of the patient's impairments (multi-system involvement);For patient/therapist safety;Necessary to address cognition/behavior during functional activity PT goals addressed during session: Mobility/safety with mobility         AM-PAC PT "6 Clicks" Mobility  Outcome Measure Help needed turning from your back to your side while in a flat bed without using bedrails?: A Lot Help needed moving from lying on your back to sitting on the side of a flat bed without using bedrails?: Total Help needed moving to and from a bed to a chair (including a wheelchair)?: Total Help needed standing up from a chair using your arms (e.g., wheelchair or bedside chair)?: Total Help needed to walk in hospital room?: Total Help needed climbing 3-5 steps with a railing? : Total 6 Click Score: 7    End of Session Equipment Utilized During Treatment: Gait belt Activity Tolerance: Patient tolerated treatment well Patient left: in chair;with call bell/phone within reach;with chair alarm set Nurse Communication: Mobility status PT Visit Diagnosis: Other abnormalities of gait and mobility (R26.89);Difficulty in walking, not elsewhere classified (R26.2);Muscle weakness (generalized) (M62.81);Other symptoms and signs involving the nervous system (L97.673)    Time: 4193-7902 PT Time Calculation (min) (ACUTE ONLY): 22 min   Charges:   PT Evaluation $PT Eval Moderate Complexity: 1 Mod          [place], PT Acute Rehabilitation Services Pager: [phone] Office: [phone]   Sandy Salaam Alayza Pieper 04/22/2020, 12:47 PM

## 2020-04-22 NOTE — Evaluation (Signed)
Occupational Therapy Evaluation Patient Details Name: Tonya Hughes MRN: [id number] DOB: 1951-04-24 Today's Date: 04/22/2020    History of Present Illness 69 yo female with aortoiliac occulsive disease admitted for aortobifem BPG with significant blood loss operatively remained intubated post op. Ischemic colitis post op. Extubated 6/20. PMHx: CAD, PVD, systolic heart failure, HTN, DM   Clinical Impression   This 69 y/o female presents with the above. PTA pt reports living with her spouse, reports was mod independent with ADL and functional mobility using rollator. Pt currently presenting with weakness, decreased sitting/standing balance and impaired cognition impacting her functional performance. Pt requiring two person assist for safe completion of all mobility tasks today, tolerating OOB to [facility] and then to recliner. Pt highly distractible, requiring frequent redirection as well as cues for safety throughout due to pt impulsivity. She currently requires max-totalA for LB and toileting ADL, minA for seated UB ADL. She will benefit from continued acute OT services and currently recommend follow up therapy services in CIR setting to maximize her overall safety and independence with ADL and moblity prior to return home.   BP start of session (supine) 111/53 Sitting EOB 118/55 End of session seated in recliner 122/69 SpO2 >90% on 1L O2.     Follow Up Recommendations  CIR    Equipment Recommendations  Other (comment) (TBD in next venue)    Recommendations for Other Services Rehab consult     Precautions / Restrictions Precautions Precautions: Fall Precaution Comments: abdominal Restrictions Weight Bearing Restrictions: No      Mobility Bed Mobility Overal bed mobility: Needs Assistance Bed Mobility: Rolling;Sidelying to Sit Rolling: Max assist;+2 for physical assistance Sidelying to sit: Max assist;+2 for physical assistance       General bed mobility comments: max multimodal  cues, assist for LEs over EOB and trunk elevation, use of bedpad to support hips with rolling   Transfers Overall transfer level: Needs assistance Equipment used: Rolling walker (2 wheeled) Transfers: Sit to/from [facility] Sit to Stand: Max assist;+2 physical assistance Stand pivot transfers: Max assist;+2 physical assistance       General transfer comment: boosting and steadying assist at RW, multimodal cues for safe UE placement; requires assist for weight shifting and for RW management with transitions; stand pivot to [facility] and then to recliner; max cues for safety throughout given pt weakness and impaired cognition     Balance                                           ADL either performed or assessed with clinical judgement   ADL Overall ADL's : Needs assistance/impaired Eating/Feeding: NPO   Grooming: Set up;Supervision/safety;Sitting Grooming Details (indicate cue type and reason): supported sitting; requires close minguard up to modA for balance when sitting EOB Upper Body Bathing: Minimal assistance;Sitting   Lower Body Bathing: Maximal assistance;+2 for physical assistance;Sit to/from stand;Sitting/lateral leans   Upper Body Dressing : Minimal assistance;Sitting   Lower Body Dressing: Maximal assistance;+2 for physical assistance;Sit to/from stand Lower Body Dressing Details (indicate cue type and reason): totalA to don socks at bed level Toilet Transfer: Maximal assistance;+2 for physical assistance;Stand-pivot;BSC;RW;Cueing for safety;Cueing for sequencing   Toileting- Clothing Manipulation and Hygiene: Total assistance;+2 for physical assistance;Sit to/from stand;Cueing for sequencing;Cueing for safety Toileting - Clothing Manipulation Details (indicate cue type and reason): assist for balance and for posterior pericare  Functional mobility during ADLs: Maximal assistance;+2 for physical assistance;Rolling walker (stand pivot  transfer ) General ADL Comments: pt largely with limitations due to impaired cognition/agitation today, also presenting with weakness, decreased sitting/standing balance                         Pertinent Vitals/Pain Pain Assessment: Faces Faces Pain Scale: Hurts little more Pain Location: generalized Pain Descriptors / Indicators: Discomfort Pain Intervention(s): Monitored during session;Repositioned     Hand Dominance     Extremity/Trunk Assessment Upper Extremity Assessment Upper Extremity Assessment: Generalized weakness   Lower Extremity Assessment Lower Extremity Assessment: Defer to PT evaluation       Communication Communication Communication: Expressive difficulties (mild)   Cognition Arousal/Alertness: Awake/alert Behavior During Therapy: Agitated;Impulsive Overall Cognitive Status: Impaired/Different from baseline Area of Impairment: Attention;Memory;Following commands;Safety/judgement;Awareness;Problem solving;Orientation                 Orientation Level: Disoriented to;Time Current Attention Level: Focused Memory: Decreased recall of precautions;Decreased short-term memory Following Commands: Follows one step commands inconsistently;Follows one step commands with increased time Safety/Judgement: Decreased awareness of safety;Decreased awareness of deficits Awareness: Intellectual Problem Solving: Slow processing;Decreased initiation;Difficulty sequencing;Requires verbal cues;Requires tactile cues General Comments: pt highly distractible, perseverating on needing to find a phone, wanting ice, having a washcloth, requires max cues for redirection and for safety due to impulsivity. Pt with poor insight into current status stating "my stomach is fine give me food"; easily agitated    General Comments       Exercises     Shoulder Instructions      Home Living Family/patient expects to be discharged to:: Private residence Living Arrangements:  Spouse/significant other Available Help at Discharge: Family;Available PRN/intermittently Type of Home: House Home Access: Stairs to enter [employer] of Steps: 3 Entrance Stairs-Rails: Right;Left Home Layout: One level     Bathroom Shower/Tub: Occupational psychologist: Standard     Home Equipment: Shower seat;Bedside commode;Walker - 4 wheels          Prior Functioning/Environment Level of Independence: Independent with assistive device(s)        Comments: using rollator, does her own bathing and dressing, drives and grocery shops a little bit         OT Problem List: Decreased strength;Decreased range of motion;Decreased activity tolerance;Impaired balance (sitting and/or standing);Decreased cognition;Decreased safety awareness;Decreased knowledge of use of DME or AE;Decreased knowledge of precautions;Obesity;Pain;Cardiopulmonary status limiting activity      OT Treatment/Interventions: Self-care/ADL training;Therapeutic exercise;Energy conservation;DME and/or AE instruction;Therapeutic activities;Cognitive remediation/compensation;Patient/family education;Balance training    OT Goals(Current goals can be found in the care plan section) Acute Rehab OT Goals Patient Stated Goal: wants food and a phone OT Goal Formulation: With patient Time For Goal Achievement: 05/06/20 Potential to Achieve Goals: Good  OT Frequency: Min 2X/week   Barriers to D/C:            Co-evaluation PT/OT/SLP Co-Evaluation/Treatment: Yes Reason for Co-Treatment: Complexity of the patient's impairments (multi-system involvement);Necessary to address cognition/behavior during functional activity;For patient/therapist safety;To address functional/ADL transfers   OT goals addressed during session: ADL's and self-care      AM-PAC OT "6 Clicks" Daily Activity     Outcome Measure Help from another person eating meals?: Total (NPO) Help from another person taking care of  personal grooming?: A Little Help from another person toileting, which includes using toliet, bedpan, or urinal?: Total Help from another person bathing (including washing, rinsing, drying)?: A  Lot Help from another person to put on and taking off regular upper body clothing?: A Lot Help from another person to put on and taking off regular lower body clothing?: A Lot 6 Click Score: 11   End of Session Equipment Utilized During Treatment: Gait belt;Rolling walker;Oxygen Nurse Communication: Mobility status (+2 for transfers)  Activity Tolerance: Patient tolerated treatment well;Treatment limited secondary to agitation Patient left: in chair;with call bell/phone within reach;with chair alarm set  OT Visit Diagnosis: Other abnormalities of gait and mobility (R26.89);Muscle weakness (generalized) (M62.81);Other symptoms and signs involving cognitive function                Time: 8004-4715 OT Time Calculation (min): 24 min Charges:  OT General Charges $OT Visit: 1 Visit OT Evaluation $OT Eval High Complexity: 1 High  Lou Cal, OT Acute Rehabilitation Services Pager [phone] Office [phone]    Raymondo Band 04/22/2020, 11:14 AM

## 2020-04-23 LAB — BASIC METABOLIC PANEL
Anion gap: 11 (ref 5–15)
BUN: 44 mg/dL — ABNORMAL HIGH (ref 8–23)
CO2: 21 mmol/L — ABNORMAL LOW (ref 22–32)
Calcium: 7.7 mg/dL — ABNORMAL LOW (ref 8.9–10.3)
Chloride: 117 mmol/L — ABNORMAL HIGH (ref 98–111)
Creatinine, Ser: 1.77 mg/dL — ABNORMAL HIGH (ref 0.44–1.00)
GFR calc Af Amer: 34 mL/min — ABNORMAL LOW (ref >60)
GFR calc non Af Amer: 29 mL/min — ABNORMAL LOW (ref >60)
Glucose, Bld: 102 mg/dL — ABNORMAL HIGH (ref 70–99)
Potassium: 3.8 mmol/L (ref 3.5–5.1)
Sodium: 149 mmol/L — ABNORMAL HIGH (ref 135–145)

## 2020-04-23 LAB — GLUCOSE, CAPILLARY
Glucose-Capillary: 100 mg/dL — ABNORMAL HIGH (ref 70–99)
Glucose-Capillary: 123 mg/dL — ABNORMAL HIGH (ref 70–99)
Glucose-Capillary: 158 mg/dL — ABNORMAL HIGH (ref 70–99)
Glucose-Capillary: 172 mg/dL — ABNORMAL HIGH (ref 70–99)
Glucose-Capillary: 176 mg/dL — ABNORMAL HIGH (ref 70–99)
Glucose-Capillary: 63 mg/dL — ABNORMAL LOW (ref 70–99)
Glucose-Capillary: 85 mg/dL (ref 70–99)
Glucose-Capillary: 88 mg/dL (ref 70–99)

## 2020-04-23 LAB — CBC
HCT: 31 % — ABNORMAL LOW (ref 36.0–46.0)
Hemoglobin: 9.6 g/dL — ABNORMAL LOW (ref 12.0–15.0)
MCH: 29.2 pg (ref 26.0–34.0)
MCHC: 31 g/dL (ref 30.0–36.0)
MCV: 94.2 fL (ref 80.0–100.0)
Platelets: 197 10*3/uL (ref 150–400)
RBC: 3.29 MIL/uL — ABNORMAL LOW (ref 3.87–5.11)
RDW: 16.5 % — ABNORMAL HIGH (ref 11.5–15.5)
WBC: 15.9 10*3/uL — ABNORMAL HIGH (ref 4.0–10.5)
nRBC: 0 % (ref 0.0–0.2)

## 2020-04-23 MED ORDER — DEXTROSE 50 % IV SOLN
25.0000 mL | Freq: Once | INTRAVENOUS | Status: AC
  Administered 2020-04-23: 25 mL via INTRAVENOUS
  Filled 2020-04-23: qty 50

## 2020-04-23 MED ORDER — CIPROFLOXACIN IN D5W 400 MG/200ML IV SOLN
400.0000 mg | Freq: Two times a day (BID) | INTRAVENOUS | Status: DC
  Administered 2020-04-23 – 2020-04-26 (×6): 400 mg via INTRAVENOUS
  Filled 2020-04-23 (×6): qty 200

## 2020-04-23 MED ORDER — KCL IN DEXTROSE-NACL 10-5-0.45 MEQ/L-%-% IV SOLN
INTRAVENOUS | Status: DC
  Filled 2020-04-23 (×7): qty 1000

## 2020-04-23 NOTE — Progress Notes (Signed)
PHARMACY NOTE:  ANTIMICROBIAL RENAL DOSAGE ADJUSTMENT  Current antimicrobial regimen includes a mismatch between antimicrobial dosage and estimated renal function.  As per policy approved by the Pharmacy & Therapeutics and Medical Executive Committees, the antimicrobial dosage will be adjusted accordingly.  Current antimicrobial dosage:  Cipro 449m IV Q24H  Indication: intra-abdominal infection  Renal Function:  Estimated Creatinine Clearance: 32.6 mL/min (A) (by C-G formula based on SCr of 1.77 mg/dL (H)). []      On intermittent HD, scheduled: []      On CRRT    Antimicrobial dosage has been changed to:  Cipro 404mIV Q12H   KiAntonietta JewelPharmD, BC[facility] City[facility]acist  Phone: [phone]/22/2021 3:23 PM  Please check AMION for all [place] numbers After 10:00 PM, call [phone]

## 2020-04-23 NOTE — Progress Notes (Signed)
Patient ID: Tonya Hughes, female   DOB: 1951/01/14, 69 y.o.   MRN: [id number]    4 Days Post-Op  Subjective: Patient upset this morning because she wants to have some coffee.  Had a "blow out" yesterday and "messed myself" Denies abdominal pain today.  ROS: See above, otherwise other systems negative  Objective: Vital signs in last 24 hours: Temp:  [97.9 F (36.6 C)-98.7 F (37.1 C)] 98.6 F (37 C) (06/22 0741) Pulse Rate:  [66-87] 87 (06/22 0741) Resp:  [15-24] 18 (06/22 0741) BP: (111-173)/(45-105) 173/49 (06/22 0741) SpO2:  [89 %-100 %] 97 % (06/22 0741) Weight:  [88.6 kg] 88.6 kg (06/22 0417) Last BM Date: 04/22/20  Intake/Output from previous day: 06/21 0701 - 06/22 0700 In: 432 [I.V.:282; IV Piggyback:150] Out: 2015 [Urine:1665; Emesis/NG output:350] Intake/Output this shift: No intake/output data recorded.  PE: Abd: soft, seems NT to palpation, midline wound is c/d/i with staples.  Few BS.  Only 350cc of output documented in the last 24 hrs.  Less than 50cc currently in new cannister.  Lab Results:  Recent Labs    04/21/20 0321 04/22/20 0400  WBC 12.4* 12.6*  HGB 9.5* 8.4*  HCT 28.4* 26.8*  PLT 110* 131*   BMET Recent Labs    04/21/20 0321 04/22/20 0400  NA 144 146*  K 4.0 4.1  CL 114* 116*  CO2 21* 21*  GLUCOSE 144* 122*  BUN 36* 39*  CREATININE 2.04* 1.91*  CALCIUM 6.8* 7.1*   PT/INR No results for input(s): LABPROT, INR in the last 72 hours. CMP     Component Value Date/Time   NA 146 (H) 04/22/2020 0400   K 4.1 04/22/2020 0400   CL 116 (H) 04/22/2020 0400   CO2 21 (L) 04/22/2020 0400   GLUCOSE 122 (H) 04/22/2020 0400   BUN 39 (H) 04/22/2020 0400   CREATININE 1.91 (H) 04/22/2020 0400   CALCIUM 7.1 (L) 04/22/2020 0400   PROT 4.5 (L) 04/19/2020 0421   ALBUMIN 3.2 (L) 04/19/2020 0421   AST 92 (H) 04/19/2020 0421   ALT 49 (H) 04/19/2020 0421   ALKPHOS 32 (L) 04/19/2020 0421   BILITOT 0.8 04/19/2020 0421   GFRNONAA 27 (L) 04/22/2020 0400     GFRAA 31 (L) 04/22/2020 0400   Lipase  No results found for: LIPASE     Studies/Results: DG Chest Port 1 View  Result Date: 04/22/2020 CLINICAL DATA:  Respiratory failure. EXAM: PORTABLE CHEST 1 VIEW COMPARISON:  One-view chest x-ray 04/21/2020 FINDINGS: Patient has been extubated. NG tube courses off the inferior border of the film. Right IJ line is stable. Moderate pulmonary vascular congestion is slightly improved. Bilateral effusions and basilar airspace opacities are again noted. Aeration is slightly improved. Lung volumes remain low. IMPRESSION: 1. Improving pulmonary vascular congestion and bilateral effusions. 2. Bibasilar airspace disease likely reflects atelectasis. Infection is not excluded. Electronically Signed   By: San Morelle M.D.   On: 04/22/2020 08:28    Anti-infectives: Anti-infectives (From admission, onward)   Start     Dose/Rate Route Frequency Ordered Stop   04/20/20 1600  ciprofloxacin (CIPRO) IVPB 400 mg     Discontinue     400 mg 200 mL/hr over 60 Minutes Intravenous Every 24 hours 04/19/20 2132     04/19/20 1800  metroNIDAZOLE (FLAGYL) IVPB 500 mg  Status:  Discontinued        500 mg 100 mL/hr over 60 Minutes Intravenous Every 6 hours 04/19/20 1530 04/19/20 1549   04/19/20 1700  metroNIDAZOLE (FLAGYL) IVPB 500 mg     Discontinue     500 mg 100 mL/hr over 60 Minutes Intravenous Every 8 hours 04/19/20 1549     04/19/20 1630  ciprofloxacin (CIPRO) IVPB 400 mg  Status:  Discontinued        400 mg 200 mL/hr over 60 Minutes Intravenous Every 12 hours 04/19/20 1530 04/19/20 2132   04/18/20 0743  vancomycin (VANCOCIN) IVPB 1000 mg/200 mL premix        1,000 mg 200 mL/hr over 60 Minutes Intravenous 60 min pre-op 04/18/20 0743 04/18/20 1900       Assessment/Plan Ischemic colitis of the left/sigmoid colon and proximal rectum - likely chronic disease exacerbated by recent surgical bypass with ligation of the IMA. -no further bloody BMs at this  time -hgb stable, pending -WBC stable, pending for today -Continue abx, per primary service -continue conservative management. No needs for acute surgical intervention at this time unless she worsens and develops obvious acute abdomen c/w ischemia. -passing stool.  Minimal NGT output.  Discussed with Dr. Donzetta Matters, will Dc NGT and allow clear liquids today   FEN - CLD VTE -on hold due to bloody BM 6/18 ID -C/F   LOS: 5 days    Henreitta Cea , [facility] 04/23/2020, 8:35 AM Please see Amion for pager number during day hours 7:00am-4:30pm or 7:00am -11:30am on weekends

## 2020-04-23 NOTE — Progress Notes (Signed)
Hypoglycemic Event  CBG: 63  Treatment: D50 25 mL (12.5 gm)  Symptoms: Hungry  Follow-up CBG: Time:0113 CBG Result=123  Possible Reasons for Event: Inadequate meal intake  Comments/MD notified:Dr.Fields    Shirline Kendle, [employer]

## 2020-04-23 NOTE — Progress Notes (Addendum)
Progress Note    04/23/2020 8:12 AM 4 Days Post-Op  Subjective: She was transferred to progressive care unit yesterday.  She states she was out of bed yesterday and attempted to walk a short distance.    This morning, she is less aggressive, complaining of thirst, dry mouth and hunger.  Behavior yesterday was evidently not associated with an acute delirium.  She continues to demonstrate poor insight into her surgical procedure and postoperative care.  She denies lower extremity pain or abdominal pain this morning.  She is not passing flatus.  She is tolerating ice chips   Vitals:   04/23/20 0417 04/23/20 0741  BP: (!) 167/59 (!) 173/49  Pulse: 86 87  Resp:  18  Temp: 98.4 F (36.9 C) 98.6 F (37 C)  SpO2: 94% 97%    Physical Exam: Cardiac: Rate and rhythm are regular Lungs: Scattered wheezes Incisions: Midline incision and both groin incisions are well approximated without drainage or erythema. Extremities: Feet are warm with intact sensation and motor function.  Skin intact Abdomen: Nondistended.  Normoactive bowel sounds. NG tube output: 350 cc, bilious Urine output: 1665 cc CBC    Component Value Date/Time   WBC 12.6 (H) 04/22/2020 0400   RBC 2.88 (L) 04/22/2020 0400   HGB 8.4 (L) 04/22/2020 0400   HCT 26.8 (L) 04/22/2020 0400   PLT 131 (L) 04/22/2020 0400   MCV 93.1 04/22/2020 0400   MCH 29.2 04/22/2020 0400   MCHC 31.3 04/22/2020 0400   RDW 16.6 (H) 04/22/2020 0400   LYMPHSABS 1.3 04/19/2020 1549   MONOABS 1.1 (H) 04/19/2020 1549   EOSABS 0.1 04/19/2020 1549   BASOSABS 0.1 04/19/2020 1549    BMET    Component Value Date/Time   NA 146 (H) 04/22/2020 0400   K 4.1 04/22/2020 0400   CL 116 (H) 04/22/2020 0400   CO2 21 (L) 04/22/2020 0400   GLUCOSE 122 (H) 04/22/2020 0400   BUN 39 (H) 04/22/2020 0400   CREATININE 1.91 (H) 04/22/2020 0400   CALCIUM 7.1 (L) 04/22/2020 0400   GFRNONAA 27 (L) 04/22/2020 0400   GFRAA 31 (L) 04/22/2020 0400      Intake/Output Summary (Last 24 hours) at 04/23/2020 0812 Last data filed at 04/23/2020 0636 Gross per 24 hour  Intake 390 ml  Output 2015 ml  Net -1625 ml    HOSPITAL MEDICATIONS Scheduled Meds: . sodium chloride   Intravenous Once  . albuterol  2.5 mg Nebulization BID  . arformoterol  15 mcg Nebulization BID  . budesonide (PULMICORT) nebulizer solution  0.5 mg Nebulization BID  . Chlorhexidine Gluconate Cloth  6 each Topical Daily  . heparin  5,000 Units Subcutaneous Q8H  . insulin aspart  0-15 Units Subcutaneous Q4H  . mouth rinse  15 mL Mouth Rinse BID  . nicotine  14 mg Transdermal Daily  . sodium chloride flush  10-40 mL Intracatheter Q12H   Continuous Infusions: . sodium chloride    . sodium chloride 10 mL/hr at 04/19/20 2200  . sodium chloride Stopped (04/18/20 1739)  . sodium chloride 20 mL/hr at 04/22/20 0845  . ciprofloxacin 400 mg (04/22/20 1510)  . dexmedetomidine (PRECEDEX) IV infusion 1.2 mcg/kg/hr (04/22/20 1050)  . famotidine (PEPCID) IV 20 mg (04/22/20 2223)  . metronidazole 500 mg (04/23/20 0111)  . norepinephrine (LEVOPHED) Adult infusion Stopped (04/21/20 1716)   PRN Meds:.sodium chloride, acetaminophen **OR** acetaminophen, alum & mag hydroxide-simeth, bisacodyl, diphenhydrAMINE, docusate sodium, fentaNYL (SUBLIMAZE) injection, guaiFENesin-dextromethorphan, hydrALAZINE, labetalol, metoprolol tartrate, morphine injection, ondansetron,  oxyCODONE, phenol, polyethylene glycol, potassium chloride, sodium chloride flush  Assessment: Postoperative day 4 aorto bifemoral bypass.  Hemodynamically stable.  Lower extremities well perfused. Good urine output.      Ischemic colitis of the left/sigmoid colon and proximal rectum - likely chronic disease exacerbated by recent surgical bypass with ligation of the IMA.  General surgery consulted and following.  Bowel sounds are present today but she is not yet passing flatus. CBC pending.  Plan: -CBC, BMP now.   Discontinue Foley catheter.  Transfer to 4 E. -Continue nasogastric intubation to wall suction and allow ice chips only for now.  Continue maintenance IV fluids, antibiotics. -Continue mobilization, PT/OT -DVT prophylaxis: Heparin subcutaneously   Risa Grill, PA-C [facility] [phone] 04/23/2020  8:12 AM    I have interviewed and examined patient with PA and agree with assessment and plan above.  Discussed with general surgery will place her NG tube out today and clear liquid diet.  She needs to be out of bed as tolerated.  Leili Eskenazi C. Donzetta Matters, MD [facility] [place] Office: [phone] Pager: [phone]

## 2020-04-24 LAB — GLUCOSE, CAPILLARY
Glucose-Capillary: 179 mg/dL — ABNORMAL HIGH (ref 70–99)
Glucose-Capillary: 177 mg/dL — ABNORMAL HIGH (ref 70–99)
Glucose-Capillary: 177 mg/dL — ABNORMAL HIGH (ref 70–99)
Glucose-Capillary: 145 mg/dL — ABNORMAL HIGH (ref 70–99)
Glucose-Capillary: 157 mg/dL — ABNORMAL HIGH (ref 70–99)
Glucose-Capillary: 201 mg/dL — ABNORMAL HIGH (ref 70–99)

## 2020-04-24 LAB — BASIC METABOLIC PANEL
Anion gap: 7 (ref 5–15)
BUN: 34 mg/dL — ABNORMAL HIGH (ref 8–23)
CO2: 22 mmol/L (ref 22–32)
Calcium: 7.3 mg/dL — ABNORMAL LOW (ref 8.9–10.3)
Chloride: 116 mmol/L — ABNORMAL HIGH (ref 98–111)
Creatinine, Ser: 1.45 mg/dL — ABNORMAL HIGH (ref 0.44–1.00)
GFR calc Af Amer: 43 mL/min — ABNORMAL LOW (ref >60)
GFR calc non Af Amer: 37 mL/min — ABNORMAL LOW (ref >60)
Glucose, Bld: 188 mg/dL — ABNORMAL HIGH (ref 70–99)
Potassium: 3.4 mmol/L — ABNORMAL LOW (ref 3.5–5.1)
Sodium: 145 mmol/L (ref 135–145)

## 2020-04-24 LAB — CBC WITH DIFFERENTIAL/PLATELET
Abs Immature Granulocytes: 0.17 10*3/uL — ABNORMAL HIGH (ref 0.00–0.07)
Basophils Absolute: 0 10*3/uL (ref 0.0–0.1)
Basophils Relative: 0 %
Eosinophils Absolute: 0.3 10*3/uL (ref 0.0–0.5)
Eosinophils Relative: 3 %
HCT: 27 % — ABNORMAL LOW (ref 36.0–46.0)
Hemoglobin: 8.4 g/dL — ABNORMAL LOW (ref 12.0–15.0)
Immature Granulocytes: 2 %
Lymphocytes Relative: 5 %
Lymphs Abs: 0.6 10*3/uL — ABNORMAL LOW (ref 0.7–4.0)
MCH: 29.2 pg (ref 26.0–34.0)
MCHC: 31.1 g/dL (ref 30.0–36.0)
MCV: 93.8 fL (ref 80.0–100.0)
Monocytes Absolute: 0.5 10*3/uL (ref 0.1–1.0)
Monocytes Relative: 4 %
Neutro Abs: 10.1 10*3/uL — ABNORMAL HIGH (ref 1.7–7.7)
Neutrophils Relative %: 86 %
Platelets: 196 10*3/uL (ref 150–400)
RBC: 2.88 MIL/uL — ABNORMAL LOW (ref 3.87–5.11)
RDW: 16.6 % — ABNORMAL HIGH (ref 11.5–15.5)
WBC: 11.7 10*3/uL — ABNORMAL HIGH (ref 4.0–10.5)
nRBC: 0 % (ref 0.0–0.2)

## 2020-04-24 MED ORDER — ENSURE ENLIVE PO LIQD: 237.0000 mL | Freq: Two times a day (BID) | ORAL | Status: DC

## 2020-04-24 MED ORDER — ADULT MULTIVITAMIN W/MINERALS CH
1.0000 | ORAL_TABLET | Freq: Every day | ORAL | Status: DC
  Administered 2020-04-25 – 2020-04-29 (×5): 1 via ORAL
  Filled 2020-04-24 (×5): qty 1

## 2020-04-24 MED FILL — Sodium Chloride IV Soln 0.9%: INTRAVENOUS | Qty: 1000 | Status: AC

## 2020-04-24 MED FILL — Heparin Sodium (Porcine) Inj 1000 Unit/ML: INTRAMUSCULAR | Qty: 30 | Status: AC

## 2020-04-24 MED FILL — Sodium Chloride Irrigation Soln 0.9%: Qty: 3000 | Status: AC

## 2020-04-24 NOTE — Progress Notes (Addendum)
  Progress Note    04/24/2020 7:42 AM 5 Days Post-Op  Subjective:  BM yesterday.  Would like to eat something more substantial   Vitals:   04/24/20 0349 04/24/20 0427  BP: (!) 160/50 (!) 146/55  Pulse: 73 70  Resp: 19 17  Temp:    SpO2: 92% 91%   Physical Exam: Lungs:  Non labored Incisions:  abd and groin incisions c/d/i Extremities:  Palpable R DP; L foot warm and well perfused Abdomen:  Soft, NT, ND Neurologic: A&O  CBC    Component Value Date/Time   WBC 11.7 (H) 04/24/2020 0341   RBC 2.88 (L) 04/24/2020 0341   HGB 8.4 (L) 04/24/2020 0341   HCT 27.0 (L) 04/24/2020 0341   PLT 196 04/24/2020 0341   MCV 93.8 04/24/2020 0341   MCH 29.2 04/24/2020 0341   MCHC 31.1 04/24/2020 0341   RDW 16.6 (H) 04/24/2020 0341   LYMPHSABS 0.6 (L) 04/24/2020 0341   MONOABS 0.5 04/24/2020 0341   EOSABS 0.3 04/24/2020 0341   BASOSABS 0.0 04/24/2020 0341    BMET    Component Value Date/Time   NA 145 04/24/2020 0341   K 3.4 (L) 04/24/2020 0341   CL 116 (H) 04/24/2020 0341   CO2 22 04/24/2020 0341   GLUCOSE 188 (H) 04/24/2020 0341   BUN 34 (H) 04/24/2020 0341   CREATININE 1.45 (H) 04/24/2020 0341   CALCIUM 7.3 (L) 04/24/2020 0341   GFRNONAA 37 (L) 04/24/2020 0341   GFRAA 43 (L) 04/24/2020 0341    INR    Component Value Date/Time   INR 1.5 (H) 04/18/2020 1648     Intake/Output Summary (Last 24 hours) at 04/24/2020 0742 Last data filed at 04/24/2020 0557 Gross per 24 hour  Intake 4014.72 ml  Output 950 ml  Net 3064.72 ml     Assessment/Plan:  69 y.o. female is s/p ABF due to BLE CLI and rest pain 5 Days Post-Op   BLE warm and well perfused General surgery advancing diet Encouraged OOB and participation with therapy teams PT/OT recommending CIR   Dagoberto Ligas, PA-C [facility] [phone] 04/24/2020 7:42 AM   I have independently interviewed and patient and agree with PA assessment and plan above.  Appreciate general surgery assistance.   We will continue antibiotics per their recommendations until leukocytosis and pain have resolved.  Clears today.  Gray Maugeri C. Donzetta Matters, MD [facility] [place] Office: [phone] Pager: [phone]

## 2020-04-24 NOTE — Progress Notes (Signed)
Occupational Therapy Treatment Patient Details Name: Tonya Hughes MRN: [id number] DOB: [birth year] Today's Date: 04/24/2020    History of present illness 69 yo female with aortoiliac occulsive disease admitted for aortobifem BPG with significant blood loss operatively remained intubated post op. Ischemic colitis post op. Extubated 6/20. PMHx: CAD, PVD, systolic heart failure, HTN, DM   OT comments  Pt seen for OT follow up, showing improvement in cognition and mobility from evaluation. Pt completed bed mobility with mod A and sit <> stands with min - mod A +2 and RW. Pt completed functional mobility to sink with mod A +2 and RW. Pt stood at sink with min A to engage in grooming tasks. She was able to stand for ~50% of tasks before needing seated rest breaks. Pt continues to show cognitive deficits in orientation, attention, command following, safety, awareness, and problem solving. Pt needed frequent redirective cues throughout session to remain on task and to support safety. She still remains unaware of current deficits- frequently asks for food or tries to engage in tasks beyond her current physical limits. Continue to recommend CIR as d/c plan. Will continue to follow per POC listed below.   Follow Up Recommendations  CIR    Equipment Recommendations  Other (comment) (TBD)    Recommendations for Other Services Rehab consult    Precautions / Restrictions Precautions Precautions: Fall Restrictions Weight Bearing Restrictions: No       Mobility Bed Mobility Overal bed mobility: Needs Assistance Bed Mobility: Supine to Sit   Sidelying to sit: Mod assist       General bed mobility comments: multimodal cues to sequence transfer, mod A to pull trunk into sitting and align hips EOB  Transfers Overall transfer level: Needs assistance Equipment used: Rolling walker (2 wheeled) Transfers: Sit to/from Stand Sit to Stand: Min assist;Mod assist;+2 physical assistance;+2  safety/equipment         General transfer comment: mod A +2 to rise and steady from bed with cues for safe hand placement on RW. Once up in recliner at sink, pt improved to min A +2 to stand for grooming tasks    Balance Overall balance assessment: Needs assistance Sitting-balance support: Feet unsupported Sitting balance-Leahy Scale: Fair     Standing balance support: Bilateral upper extremity supported Standing balance-Leahy Scale: Poor Standing balance comment: bil UE support on RW with physical assist for balance in standing                           ADL either performed or assessed with clinical judgement   ADL Overall ADL's : Needs assistance/impaired     Grooming: Minimal assistance;Standing;Oral care;Wash/dry face;Cueing for safety;Cueing for sequencing Grooming Details (indicate cue type and reason): pt standing at sink for ~50% of grooming tasks. Relied on heavy propping on forearms to maintain steadying. Frequent cues needed to sustain attention and to sequence                 Toilet Transfer: Independent;+2 for physical assistance;+2 for safety/equipment Toilet Transfer Details (indicate cue type and reason): pt needed bed pan placed underneath her on recliner when standing due to bowel incontinence and urgency Toileting- Clothing Manipulation and Hygiene: Maximal assistance;Sit to/from stand Toileting - Clothing Manipulation Details (indicate cue type and reason): assist for balance and for posterior pericare      Functional mobility during ADLs: Moderate assistance;+2 for physical assistance;+2 for safety/equipment;Rolling walker General ADL Comments: pt improving in cognition from  evaluation     Vision Patient Visual Report: No change from baseline     Perception     Praxis      Cognition Arousal/Alertness: Awake/alert Behavior During Therapy: WFL for tasks assessed/performed Overall Cognitive Status: Impaired/Different from  baseline Area of Impairment: Orientation;Attention;Memory;Following commands;Safety/judgement;Awareness;Problem solving                 Orientation Level: Disoriented to;Time Current Attention Level: Sustained Memory: Decreased short-term memory Following Commands: Follows one step commands with increased time Safety/Judgement: Decreased awareness of safety;Decreased awareness of deficits Awareness: Intellectual Problem Solving: Slow processing;Decreased initiation;Difficulty sequencing;Requires verbal cues;Requires tactile cues General Comments: pt appears to be improving cognitively from eval date. Pt able to sustain attention to task very briefly, but continues to require frequent cues, nondistracting environment and assist to successfully complete tasks. Remains unaware of deficits, asking for food        Exercises     Shoulder Instructions       General Comments      Pertinent Vitals/ Pain       Pain Assessment: Faces Faces Pain Scale: Hurts little more Pain Location: generalized, legs Pain Descriptors / Indicators: Discomfort;Aching Pain Intervention(s): Monitored during session;Repositioned  Home Living                                          Prior Functioning/Environment              Frequency  Min 2X/week        Progress Toward Goals  OT Goals(current goals can now be found in the care plan section)  Progress towards OT goals: Progressing toward goals  Acute Rehab OT Goals Patient Stated Goal: eat regular food OT Goal Formulation: With patient Time For Goal Achievement: 05/06/20 Potential to Achieve Goals: Good  Plan Discharge plan remains appropriate    Co-evaluation    PT/OT/SLP Co-Evaluation/Treatment: Yes Reason for Co-Treatment: Complexity of the patient's impairments (multi-system involvement);For patient/therapist safety;To address functional/ADL transfers   OT goals addressed during session: ADL's and  self-care;Proper use of Adaptive equipment and DME;Strengthening/ROM      AM-PAC OT "6 Clicks" Daily Activity     Outcome Measure   Help from another person eating meals?: A Little Help from another person taking care of personal grooming?: A Little Help from another person toileting, which includes using toliet, bedpan, or urinal?: A Lot Help from another person bathing (including washing, rinsing, drying)?: A Lot Help from another person to put on and taking off regular upper body clothing?: A Little Help from another person to put on and taking off regular lower body clothing?: A Lot 6 Click Score: 15    End of Session Equipment Utilized During Treatment: Gait belt;Rolling walker  OT Visit Diagnosis: Other abnormalities of gait and mobility (R26.89);Muscle weakness (generalized) (M62.81);Other symptoms and signs involving cognitive function   Activity Tolerance Patient tolerated treatment well   Patient Left in chair;with call bell/phone within reach;with chair alarm set;with nursing/sitter in room   Nurse Communication Mobility status        Time: 4008-6761 OT Time Calculation (min): 25 min  Charges: OT General Charges $OT Visit: 1 Visit OT Treatments $Self Care/Home Management : 8-22 mins  Zenovia Jarred, MSOT, OTR/L [facility] Office Number: [phone] Pager: [phone]  Zenovia Jarred 04/24/2020, 12:27 PM

## 2020-04-24 NOTE — Progress Notes (Signed)
Nutrition Follow up   DOCUMENTATION CODES:   Not applicable  INTERVENTION:    Ensure Enlive po BID, each supplement provides 350 kcal and 20 grams of protein  MVI daily   NUTRITION DIAGNOSIS:   Inadequate oral intake related to inability to eat as evidenced by NPO status.  Diet advanced   GOAL:   Patient will meet greater than or equal to 90% of their needs   Progressing   MONITOR:   Vent status, Labs, I & O's  REASON FOR ASSESSMENT:   Ventilator    ASSESSMENT:   69 yo female admitted with shock, postop aortobifemoral bypass surgery. PMH includes significant coronary disease, HF, PVD, HLD, HTN, GERD, DM.   6/18- colonoscopy showed ischemic colitis of L sigmoid and proximal rectum 6/20- extubated   Per surgery ischemic changers likely chronic disease exacerbated by recent surgical bypass. No plan for surgical intervention.   Pt tolerating clears (ginger ale) per her reports. Diet advanced to fulls this am. No meal completions documented. Denies issues with chewing or swallowing. Discussed the importance of protein intake to promote post op healing. Pt wiling to try Ensure.    Admission weight: 87.1 kg  Current weight: 89.1 kg     I/O: +9,529 ml since admit  UOP: 950 ml x 24 hrs   Drips: D5 in 1/2 NS with 10 mEq KCl @ 125 ml/hr  Medications: SS novolog Labs: K 3.4 (L) CBG 100-179  Diet Order:   Diet Order            Diet full liquid Room service appropriate? Yes; Fluid consistency: Thin  Diet effective now                 EDUCATION NEEDS:   No education needs have been identified at this time  Skin:  Skin Assessment: Skin Integrity Issues: Skin Integrity Issues:: Incisions Incisions: abdomen, bilateral groin  Last BM:  6/22  Height:   Ht Readings from Last 1 Encounters:  04/18/20 5' 3"  (1.6 m)    Weight:   Wt Readings from Last 1 Encounters:  04/24/20 89.1 kg    Ideal Body Weight:  52.3 kg  BMI:  31.2 using admit  weight  Estimated Nutritional Needs:   Kcal:  1700-1900 kcal  Protein:  85-105 grams  Fluid:  >/= 1.7 L/day  Mariana Single RD, LDN Clinical Nutrition Pager listed in [place]

## 2020-04-24 NOTE — Progress Notes (Signed)
Physical Therapy Treatment Patient Details Name: Tonya Hughes MRN: [id number] DOB: 07-29-1951 Today's Date: 04/24/2020    History of Present Illness 69 yo female with aortoiliac occulsive disease admitted for aortobifem BPG with significant blood loss operatively remained intubated post op. Ischemic colitis post op. Extubated 6/20. PMHx: CAD, PVD, systolic heart failure, HTN, DM    PT Comments    Pt pleasant and aware of wet linens on arrival asking for assist to get cleaned up. Pt able to transition to EOB, stand and walk in room today with use of RW. Pt does not recall all events since admission but was aware of some of her behavior on 2H and apologetic. Pt educated for increased mobility and progression with pt continuing to demonstrate decreased attention and awareness.     Follow Up Recommendations  CIR     Equipment Recommendations  3in1 (PT)    Recommendations for Other Services       Precautions / Restrictions Precautions Precautions: Fall Restrictions Weight Bearing Restrictions: No    Mobility  Bed Mobility Overal bed mobility: Needs Assistance Bed Mobility: Supine to Sit   Sidelying to sit: Mod assist Supine to sit: Mod assist     General bed mobility comments: mod assist to pivot toward right side of bed with rail and increased time to elevate trunk  Transfers Overall transfer level: Needs assistance Equipment used: Rolling walker (2 wheeled) Transfers: Sit to/from Stand Sit to Stand: Min assist;Mod assist;+2 physical assistance;+2 safety/equipment         General transfer comment: mod A +2 to rise and steady from bed with cues for safe hand placement on RW. Once up in recliner at sink, pt improved to min A +2 to stand for grooming tasks with 2 additional standing trials  Ambulation/Gait Ambulation/Gait assistance: Mod assist;+2 safety/equipment Gait Distance (Feet): 8 Feet Assistive device: Rolling walker (2 wheeled) Gait Pattern/deviations:  Step-through pattern;Decreased stride length;Trunk flexed;Wide base of support   Gait velocity interpretation: <1.8 ft/sec, indicate of risk for recurrent falls General Gait Details: cues for sequence, safety, posture and proximity to RW, close chair follow with pt fatiguing quickly   Stairs             Wheelchair Mobility    Modified Rankin (Stroke Patients Only)       Balance Overall balance assessment: Needs assistance Sitting-balance support: Feet unsupported Sitting balance-Leahy Scale: Fair Sitting balance - Comments: EOB with minguard assist   Standing balance support: Bilateral upper extremity supported Standing balance-Leahy Scale: Poor Standing balance comment: bil UE support on RW with physical assist for balance in standing                            Cognition Arousal/Alertness: Awake/alert Behavior During Therapy: WFL for tasks assessed/performed Overall Cognitive Status: Impaired/Different from baseline Area of Impairment: Orientation;Attention;Memory;Following commands;Safety/judgement;Awareness;Problem solving                 Orientation Level: Disoriented to;Time Current Attention Level: Sustained Memory: Decreased short-term memory Following Commands: Follows one step commands with increased time Safety/Judgement: Decreased awareness of safety;Decreased awareness of deficits Awareness: Intellectual Problem Solving: Slow processing;Decreased initiation;Difficulty sequencing;Requires verbal cues;Requires tactile cues General Comments: pt with improved cognition. Pt recalls yelling and pinching nursing finger on 2H. Pt pleasant and following commands with decreased attention and awareness      Exercises      General Comments        Pertinent Vitals/Pain  Pain Assessment: Faces Faces Pain Scale: Hurts little more Pain Location: generalized, legs Pain Descriptors / Indicators: Discomfort;Aching Pain Intervention(s): Monitored  during session;Repositioned    Home Living                      Prior Function            PT Goals (current goals can now be found in the care plan section) Acute Rehab PT Goals Patient Stated Goal: eat regular food Progress towards PT goals: Progressing toward goals    Frequency    Min 3X/week      PT Plan Current plan remains appropriate    Co-evaluation PT/OT/SLP Co-Evaluation/Treatment: Yes Reason for Co-Treatment: For patient/therapist safety;Necessary to address cognition/behavior during functional activity PT goals addressed during session: Mobility/safety with mobility;Proper use of DME;Balance OT goals addressed during session: ADL's and self-care;Proper use of Adaptive equipment and DME;Strengthening/ROM      AM-PAC PT "6 Clicks" Mobility   Outcome Measure  Help needed turning from your back to your side while in a flat bed without using bedrails?: A Lot Help needed moving from lying on your back to sitting on the side of a flat bed without using bedrails?: A Lot Help needed moving to and from a bed to a chair (including a wheelchair)?: A Lot Help needed standing up from a chair using your arms (e.g., wheelchair or bedside chair)?: A Lot Help needed to walk in hospital room?: A Lot Help needed climbing 3-5 steps with a railing? : Total 6 Click Score: 11    End of Session Equipment Utilized During Treatment: Gait belt Activity Tolerance: Patient tolerated treatment well Patient left: in chair;with call bell/phone within reach;with chair alarm set Nurse Communication: Mobility status PT Visit Diagnosis: Other abnormalities of gait and mobility (R26.89);Difficulty in walking, not elsewhere classified (R26.2);Muscle weakness (generalized) (M62.81);Other symptoms and signs involving the nervous system (S97.026)     Time: 3785-8850 PT Time Calculation (min) (ACUTE ONLY): 28 min  Charges:  $Gait Training: 8-22 mins                     Bayard Males,  PT Acute Rehabilitation Services Pager: [phone] Office: [phone]    Tobyn Osgood B Natacia Chaisson 04/24/2020, 1:11 PM

## 2020-04-24 NOTE — Progress Notes (Signed)
[facility] Progress Note  5 Days Post-Op  Subjective: CC: no complaints. Feels better  ROS   Objective: Vital signs in last 24 hours: Temp:  [97.8 F (36.6 C)-100.8 F (38.2 C)] 98.2 F (36.8 C) (06/23 0800) Pulse Rate:  [70-91] 70 (06/23 0800) Resp:  [16-26] 19 (06/23 0800) BP: (123-173)/(40-88) 149/53 (06/23 0800) SpO2:  [91 %-99 %] 99 % (06/23 0817) Weight:  [89.1 kg] 89.1 kg (06/23 0338) Last BM Date: 04/23/20  Intake/Output from previous day: 06/22 0701 - 06/23 0700 In: 4014.7 [P.O.:960; I.V.:2404.7; IV Piggyback:650] Out: 950 [Urine:950] Intake/Output this shift: No intake/output data recorded.  PE General: pleasant, WD, WN white female who is laying in bed in NAD HEENT: head is normocephalic, atraumatic.  Sclera are noninjected.  PERRL.  Ears and nose without any masses or lesions.  Mouth is pink and moist Heart: regular, rate, and rhythm.  Normal s1,s2. No obvious murmurs, gallops, or rubs noted.   Lungs: CTAB, no wheezes, rhonchi, or rales noted.  Respiratory effort nonlabored Abd: soft, minimal tenderness both sides Skin: warm and dry with no masses, lesions, or rashes Psych: A&Ox3 with an appropriate affect.   Lab Results:  Recent Labs    04/23/20 0934 04/24/20 0341  WBC 15.9* 11.7*  HGB 9.6* 8.4*  HCT 31.0* 27.0*  PLT 197 196   BMET Recent Labs    04/23/20 0934 04/24/20 0341  NA 149* 145  K 3.8 3.4*  CL 117* 116*  CO2 21* 22  GLUCOSE 102* 188*  BUN 44* 34*  CREATININE 1.77* 1.45*  CALCIUM 7.7* 7.3*   PT/INR No results for input(s): LABPROT, INR in the last 72 hours. CMP     Component Value Date/Time   NA 145 04/24/2020 0341   K 3.4 (L) 04/24/2020 0341   CL 116 (H) 04/24/2020 0341   CO2 22 04/24/2020 0341   GLUCOSE 188 (H) 04/24/2020 0341   BUN 34 (H) 04/24/2020 0341   CREATININE 1.45 (H) 04/24/2020 0341   CALCIUM 7.3 (L) 04/24/2020 0341   PROT 4.5 (L) 04/19/2020 0421   ALBUMIN 3.2 (L) 04/19/2020 0421   AST 92 (H)  04/19/2020 0421   ALT 49 (H) 04/19/2020 0421   ALKPHOS 32 (L) 04/19/2020 0421   BILITOT 0.8 04/19/2020 0421   GFRNONAA 37 (L) 04/24/2020 0341   GFRAA 43 (L) 04/24/2020 0341   Lipase  No results found for: LIPASE     Studies/Results: No results found.  Anti-infectives: Anti-infectives (From admission, onward)   Start     Dose/Rate Route Frequency Ordered Stop   04/23/20 1700  ciprofloxacin (CIPRO) IVPB 400 mg     Discontinue     400 mg 200 mL/hr over 60 Minutes Intravenous Every 12 hours 04/23/20 1523     04/20/20 1600  ciprofloxacin (CIPRO) IVPB 400 mg  Status:  Discontinued        400 mg 200 mL/hr over 60 Minutes Intravenous Every 24 hours 04/19/20 2132 04/23/20 1523   04/19/20 1800  metroNIDAZOLE (FLAGYL) IVPB 500 mg  Status:  Discontinued        500 mg 100 mL/hr over 60 Minutes Intravenous Every 6 hours 04/19/20 1530 04/19/20 1549   04/19/20 1700  metroNIDAZOLE (FLAGYL) IVPB 500 mg     Discontinue     500 mg 100 mL/hr over 60 Minutes Intravenous Every 8 hours 04/19/20 1549     04/19/20 1630  ciprofloxacin (CIPRO) IVPB 400 mg  Status:  Discontinued  400 mg 200 mL/hr over 60 Minutes Intravenous Every 12 hours 04/19/20 1530 04/19/20 2132   04/18/20 0743  vancomycin (VANCOCIN) IVPB 1000 mg/200 mL premix        1,000 mg 200 mL/hr over 60 Minutes Intravenous 60 min pre-op 04/18/20 0743 04/18/20 1900       Assessment/Plan  continue abx until wbc normal and abd completely resolved Ischemic colitis improving Advance diet slowly. Start clears today    LOS: 6 days    Norm Parcel , [facility] 04/24/2020, 9:09 AM Please see Amion for pager number during day hours 7:00am-4:30pm

## 2020-04-24 NOTE — Progress Notes (Signed)
Inpatient Rehabilitation Admissions Coordinator  Inpatient rehab consult received. I met with patient at bedside . We discussed goals and expectations of a possible inpt rehab admit. She has been to [facility] in the past, but prefers inpt rehab at [facility] if approved. I have left a voicemail for her spouse, Carlos American, to call me to further discuss. I will begin insurance authorization with [facility] Medicare . CIR admit pending insurance approval and bed availability. I will follow.  Danne Baxter, RN, MSN Rehab Admissions Coordinator [phone] 04/24/2020 11:37 AM

## 2020-04-25 LAB — GLUCOSE, CAPILLARY
Glucose-Capillary: 116 mg/dL — ABNORMAL HIGH (ref 70–99)
Glucose-Capillary: 125 mg/dL — ABNORMAL HIGH (ref 70–99)
Glucose-Capillary: 131 mg/dL — ABNORMAL HIGH (ref 70–99)
Glucose-Capillary: 143 mg/dL — ABNORMAL HIGH (ref 70–99)
Glucose-Capillary: 147 mg/dL — ABNORMAL HIGH (ref 70–99)
Glucose-Capillary: 156 mg/dL — ABNORMAL HIGH (ref 70–99)

## 2020-04-25 MED ORDER — POTASSIUM CHLORIDE CRYS ER 20 MEQ PO TBCR
20.0000 meq | EXTENDED_RELEASE_TABLET | Freq: Every day | ORAL | Status: DC
  Administered 2020-04-25 – 2020-04-29 (×5): 20 meq via ORAL
  Filled 2020-04-25 (×5): qty 1

## 2020-04-25 MED ORDER — CARVEDILOL 3.125 MG PO TABS
3.1250 mg | ORAL_TABLET | Freq: Two times a day (BID) | ORAL | Status: DC
  Administered 2020-04-25 – 2020-04-29 (×8): 3.125 mg via ORAL
  Filled 2020-04-25 (×9): qty 1

## 2020-04-25 MED ORDER — ASPIRIN EC 81 MG PO TBEC
81.0000 mg | DELAYED_RELEASE_TABLET | Freq: Every day | ORAL | Status: DC
  Administered 2020-04-25 – 2020-04-29 (×5): 81 mg via ORAL
  Filled 2020-04-25 (×5): qty 1

## 2020-04-25 MED ORDER — LISINOPRIL 5 MG PO TABS
5.0000 mg | ORAL_TABLET | Freq: Every day | ORAL | Status: DC
  Administered 2020-04-25 – 2020-04-29 (×5): 5 mg via ORAL
  Filled 2020-04-25 (×5): qty 1

## 2020-04-25 MED ORDER — LORATADINE 10 MG PO TABS
10.0000 mg | ORAL_TABLET | Freq: Every day | ORAL | Status: DC
  Administered 2020-04-25 – 2020-04-29 (×5): 10 mg via ORAL
  Filled 2020-04-25 (×5): qty 1

## 2020-04-25 MED ORDER — FUROSEMIDE 40 MG PO TABS
40.0000 mg | ORAL_TABLET | Freq: Every day | ORAL | Status: DC
  Administered 2020-04-25 – 2020-04-29 (×5): 40 mg via ORAL
  Filled 2020-04-25 (×5): qty 1

## 2020-04-25 MED ORDER — PAROXETINE HCL 20 MG PO TABS
20.0000 mg | ORAL_TABLET | Freq: Every day | ORAL | Status: DC
  Administered 2020-04-25 – 2020-04-29 (×5): 20 mg via ORAL
  Filled 2020-04-25 (×5): qty 1

## 2020-04-25 MED ORDER — ATORVASTATIN CALCIUM 40 MG PO TABS
40.0000 mg | ORAL_TABLET | Freq: Every day | ORAL | Status: DC
  Administered 2020-04-25 – 2020-04-29 (×5): 40 mg via ORAL
  Filled 2020-04-25 (×5): qty 1

## 2020-04-25 NOTE — Progress Notes (Addendum)
Progress Note    04/25/2020 7:22 AM 6 Days Post-Op  Subjective: Complaining of lower extremity swelling this morning.  She tells me she was placed on Lasix in the past due to congestive heart failure.  She denies chest pain, no pain, shortness of breath, nausea or vomiting.  She says she has a little weight getting out of bed.   Vitals:   04/25/20 0300 04/25/20 0400  BP: (!) 154/54 (!) 154/54  Pulse: 78   Resp: 13 13  Temp: 97.8 F (36.6 C)   SpO2: 98%     Physical Exam: Cardiac: Rate and rhythm regular Lungs: Clear to auscultation bilaterally Incisions: Midline bilateral groin incisions are well approximated.  Mild ischemia to anterior aspect of left groin incision.  There is no erythema or significant drainage. Extremities: Both feet are warm and well perfused.  Intact motor and sensation.  Mild edema. Abdomen: Abdomen is nondistended with normoactive bowel sounds.  Generalized mild tenderness to palpation no peritoneal signs  CBC    Component Value Date/Time   WBC 11.7 (H) 04/24/2020 0341   RBC 2.88 (L) 04/24/2020 0341   HGB 8.4 (L) 04/24/2020 0341   HCT 27.0 (L) 04/24/2020 0341   PLT 196 04/24/2020 0341   MCV 93.8 04/24/2020 0341   MCH 29.2 04/24/2020 0341   MCHC 31.1 04/24/2020 0341   RDW 16.6 (H) 04/24/2020 0341   LYMPHSABS 0.6 (L) 04/24/2020 0341   MONOABS 0.5 04/24/2020 0341   EOSABS 0.3 04/24/2020 0341   BASOSABS 0.0 04/24/2020 0341    BMET    Component Value Date/Time   NA 145 04/24/2020 0341   K 3.4 (L) 04/24/2020 0341   CL 116 (H) 04/24/2020 0341   CO2 22 04/24/2020 0341   GLUCOSE 188 (H) 04/24/2020 0341   BUN 34 (H) 04/24/2020 0341   CREATININE 1.45 (H) 04/24/2020 0341   CALCIUM 7.3 (L) 04/24/2020 0341   GFRNONAA 37 (L) 04/24/2020 0341   GFRAA 43 (L) 04/24/2020 0341     Intake/Output Summary (Last 24 hours) at 04/25/2020 0722 Last data filed at 04/24/2020 2104 Gross per 24 hour  Intake 480 ml  Output 300 ml  Net 180 ml    HOSPITAL  MEDICATIONS Scheduled Meds: . sodium chloride   Intravenous Once  . albuterol  2.5 mg Nebulization BID  . arformoterol  15 mcg Nebulization BID  . budesonide (PULMICORT) nebulizer solution  0.5 mg Nebulization BID  . Chlorhexidine Gluconate Cloth  6 each Topical Daily  . feeding supplement (ENSURE ENLIVE)  237 mL Oral BID BM  . heparin  5,000 Units Subcutaneous Q8H  . insulin aspart  0-15 Units Subcutaneous Q4H  . mouth rinse  15 mL Mouth Rinse BID  . multivitamin with minerals  1 tablet Oral Daily  . nicotine  14 mg Transdermal Daily  . sodium chloride flush  10-40 mL Intracatheter Q12H   Continuous Infusions: . sodium chloride    . sodium chloride 10 mL/hr at 04/19/20 2200  . sodium chloride Stopped (04/18/20 1739)  . sodium chloride Stopped (04/23/20 1019)  . ciprofloxacin 400 mg (04/25/20 0444)  . dextrose 5 % and 0.45 % NaCl with KCl 10 mEq/L 125 mL/hr at 04/25/20 0436  . famotidine (PEPCID) IV 20 mg (04/24/20 2045)  . metronidazole 500 mg (04/25/20 0044)   PRN Meds:.sodium chloride, acetaminophen **OR** acetaminophen, alum & mag hydroxide-simeth, bisacodyl, diphenhydrAMINE, docusate sodium, fentaNYL (SUBLIMAZE) injection, guaiFENesin-dextromethorphan, hydrALAZINE, labetalol, metoprolol tartrate, morphine injection, ondansetron, oxyCODONE, phenol, polyethylene glycol, potassium chloride, sodium  chloride flush  Assessment: Status post aortobifemoral bypass.  She is tolerating her diet.  Hemodynamically stable.  Acute blood loss anemia.  Asymptomatic and no indication for transfusion.  Ischemic colitis.  Antibiotics continue and leukocytosis continues to trend downward.  No left shift.  No significant abdominal pain. Tolerating diet. CBC pending this morning.  Plan: -Follow-up labs this morning.  Continue PT/OT and mobilization.  Discontinue IV fluids.  Restart her home meds. ? CIR -DVT prophylaxis: Heparin subcutaneously  Risa Grill, PA-C [facility] [phone] 04/25/2020  7:22 AM   I have independently interviewed and examined patient and agree with PA assessment and plan above.  General surgery help much appreciated.  We will continue antibiotics until leukocytosis resolves currently resolving.  Creatinine improving.  Will give diet as tolerated  Channing Yeager C. Donzetta Matters, MD [facility] [place] Office: [phone] Pager: [phone]

## 2020-04-25 NOTE — Social Work (Signed)
CSW received consult from CIR for SNF placement. CSW attempted to contact patient's spouse Tonya Hughes to discuss discharge plan. CSW left a voicemail, awaiting a call back.  Criss Alvine, [place] Social Worker

## 2020-04-25 NOTE — Progress Notes (Signed)
Inpatient Diabetes Program Recommendations  AACE/ADA: New Consensus Statement on Inpatient Glycemic Control (2015)  Target Ranges:  Prepandial:   less than 140 mg/dL      Peak postprandial:   less than 180 mg/dL (1-2 hours)      Critically ill patients:  140 - 180 mg/dL   Lab Results  Component Value Date   GLUCAP 116 (H) 04/25/2020   HGBA1C 6.9 (H) 04/16/2020    Review of Glycemic Control  Diabetes history: None Current orders for Inpatient glycemic control: Novolog 0-15 units Q4 hours  Ensure Enlive bid between meals   Attempted to speak with pt regarding A1c level of 6.9% this admission (meeting new Diagnosis criteria for DM). Pt reported nausea and did not "feel well". Pt was also hard to arouse barely able to keep eyes open. Called up to nursing desk from room to notify nursing staff of nausea in case pt can have medication.  Pt was able to tell me she has never been diagnosed with Diabetes and she does have a PCP she goes to.  Will try to see pt tomorrow.  Thanks,  Tama Headings RN, MSN, BC-ADM Inpatient Diabetes Coordinator Team Pager [phone] (8a-5p)

## 2020-04-25 NOTE — Progress Notes (Signed)
OT Cancellation Note  Patient Details Name: Tonya Hughes MRN: [id number] DOB: 15-Sep-1951   Cancelled Treatment:    Reason Eval/Treat Not Completed: Patient declined, no reason specified Pt declining therapy this date, stating she is in too much pain from her stomach. Will check back when able.  Corinne Ports E. Amond Speranza, COTA/L Acute Rehabilitation Services [place] 04/25/2020, 3:05 PM

## 2020-04-25 NOTE — NC FL2 (Signed)
[place] MEDICAID FL2 LEVEL OF CARE SCREENING TOOL     IDENTIFICATION  Patient Name: Tonya Hughes Birthdate: [birth year] Sex: female Admission Date (Current Location): 04/18/2020  [facility] and [place] Number:  Herbalist and Address:  [facility], [place] [address] [place], [place] [address]      Provider Number: [id number]  Attending Physician Name and Address:  Cain, Lynd Name and Phone Number:  Lyla Son    Current Level of Care: Hospital Recommended Level of Care: [place] Prior Approval Number:    Date Approved/Denied:   PASRR Number: [id number]  Discharge Plan: SNF    Current Diagnoses: Patient Active Problem List   Diagnosis Date Noted  . Hemorrhagic shock ([place])   . Peripheral arterial occlusive disease ([place]) 04/18/2020  . Shock ([place]) 04/18/2020  . Hypertension   . Hypercholesteremia   . GERD (gastroesophageal reflux disease)   . Depressive disorder   . Congestive heart failure (CHF) ([place])   . Back pain   . Acute deep vein thrombosis (DVT) of proximal vein of right lower extremity ([place]) 08/27/2017  . Closed fracture of lumbar vertebra ([place]) 05/31/2017  . S/P lumbar fusion 04/21/2017  . Lumbar radiculopathy 02/25/2017    Orientation RESPIRATION BLADDER Height & Weight     Self, Time, Situation, Place  Normal Continent Weight: 198 lb 9.6 oz (90.1 kg) Height:  5' 3"  (160 cm)  BEHAVIORAL SYMPTOMS/MOOD NEUROLOGICAL BOWEL NUTRITION STATUS      Continent Diet (See discharge summary)  AMBULATORY STATUS COMMUNICATION OF NEEDS Skin   Extensive Assist Verbally Normal                       Personal Care Assistance Level of Assistance  Bathing, Dressing, Feeding Bathing Assistance: Maximum assistance Feeding assistance: Independent Dressing Assistance: Maximum assistance     Functional Limitations Info             SPECIAL CARE FACTORS FREQUENCY  PT (By licensed PT), OT  (By licensed OT)     PT Frequency: 5x a week OT Frequency: 5x a week            Contractures Contractures Info: Not present    Additional Factors Info  Code Status, Allergies Code Status Info: FULL Allergies Info: Zanaflex (tizanidine Hcl); Penicillins           Current Medications (04/25/2020):  This is the current hospital active medication list Current Facility-Administered Medications  Medication Dose Route Frequency Provider Last Rate Last Admin  . 0.9 %  sodium chloride infusion (Manually program via Guardrails IV Fluids)   Intravenous Once Buccini, Robert, MD      . 0.9 %  sodium chloride infusion  500 mL Intravenous Once PRN Buccini, Robert, MD      . 0.9 %  sodium chloride infusion   Intravenous Continuous Buccini, Robert, MD 10 mL/hr at 04/19/20 2200 Rate Verify at 04/19/20 2200  . 0.9 %  sodium chloride infusion  250 mL Intravenous Continuous Ronald Lobo, MD   Held at 04/18/20 1739  . 0.9 %  sodium chloride infusion   Intravenous Continuous Buccini, Herbie [place], MD   Paused at 04/23/20 1019  . acetaminophen (TYLENOL) tablet 325-650 mg  325-650 mg Oral Q4H PRN Ronald Lobo, MD   650 mg at 04/23/20 2040   Or  . acetaminophen (TYLENOL) suppository 325-650 mg  325-650 mg Rectal Q4H PRN Ronald Lobo, MD      .  albuterol (PROVENTIL) (2.5 MG/3ML) 0.083% nebulizer solution 2.5 mg  2.5 mg Nebulization BID Rigoberto Noel, MD   2.5 mg at 04/24/20 1954  . alum & mag hydroxide-simeth (MAALOX/MYLANTA) 200-200-20 MG/5ML suspension 15-30 mL  15-30 mL Oral Q2H PRN Buccini, Robert, MD      . arformoterol [facility]) nebulizer solution 15 mcg  15 mcg Nebulization BID Ronald Lobo, MD   15 mcg at 04/24/20 1955  . aspirin EC tablet 81 mg  81 mg Oral Daily Barbie Banner, PA-C   81 mg at 04/25/20 0840  . atorvastatin (LIPITOR) tablet 40 mg  40 mg Oral Daily Barbie Banner, PA-C   40 mg at 04/25/20 0354  . bisacodyl (DULCOLAX) suppository 10 mg  10 mg Rectal Daily PRN Buccini,  Robert, MD      . budesonide (PULMICORT) nebulizer solution 0.5 mg  0.5 mg Nebulization BID Rigoberto Noel, MD   0.5 mg at 04/24/20 1957  . carvedilol (COREG) tablet 3.125 mg  3.125 mg Oral BID WC Barbie Banner, PA-C   3.125 mg at 04/25/20 0839  . Chlorhexidine Gluconate Cloth 2 % PADS 6 each  6 each Topical Daily Ronald Lobo, MD   6 each at 04/25/20 [phone]  . ciprofloxacin (CIPRO) IVPB 400 mg  400 mg Intravenous Q12H Waynetta Sandy, MD 200 mL/hr at 04/25/20 0444 400 mg at 04/25/20 0444  . diphenhydrAMINE (BENADRYL) injection 25 mg  25 mg Intravenous Q6H PRN Elam Dutch, MD   25 mg at 04/23/20 0004  . docusate sodium (COLACE) capsule 100 mg  100 mg Oral BID PRN Buccini, Robert, MD      . famotidine (PEPCID) IVPB 20 mg premix  20 mg Intravenous QHS Buccini, Herbie [place], MD 100 mL/hr at 04/24/20 2045 20 mg at 04/24/20 2045  . feeding supplement (ENSURE ENLIVE) (ENSURE ENLIVE) liquid 237 mL  237 mL Oral BID BM Waynetta Sandy, MD      . fentaNYL (SUBLIMAZE) injection 50 mcg  50 mcg Intravenous Q1H PRN Ronald Lobo, MD   50 mcg at 04/19/20 0533  . furosemide (LASIX) tablet 40 mg  40 mg Oral Daily Barbie Banner, PA-C   40 mg at 04/25/20 0840  . guaiFENesin-dextromethorphan (ROBITUSSIN DM) 100-10 MG/5ML syrup 15 mL  15 mL Oral Q4H PRN Buccini, Robert, MD      . heparin injection 5,000 Units  5,000 Units Subcutaneous Q8H Ronald Lobo, MD   5,000 Units at 04/25/20 1300  . hydrALAZINE (APRESOLINE) injection 5 mg  5 mg Intravenous Q20 Min PRN Buccini, Robert, MD      . insulin aspart (novoLOG) injection 0-15 Units  0-15 Units Subcutaneous Q4H Ronald Lobo, MD   2 Units at 04/25/20 0905  . labetalol (NORMODYNE) injection 10 mg  10 mg Intravenous Q10 min PRN Ronald Lobo, MD   10 mg at 04/24/20 0358  . lisinopril (ZESTRIL) tablet 5 mg  5 mg Oral Daily Barbie Banner, PA-C   5 mg at 04/25/20 1275  . loratadine (CLARITIN) tablet 10 mg  10 mg Oral Daily Barbie Banner,  PA-C   10 mg at 04/25/20 0840  . MEDLINE mouth rinse  15 mL Mouth Rinse BID Waynetta Sandy, MD   15 mL at 04/25/20 0906  . metoprolol tartrate (LOPRESSOR) injection 2-5 mg  2-5 mg Intravenous Q2H PRN Buccini, Robert, MD      . metroNIDAZOLE (FLAGYL) IVPB 500 mg  500 mg Intravenous Q8H Buccini, Robert, MD 100 mL/hr  at 04/25/20 0850 500 mg at 04/25/20 0850  . morphine 2 MG/ML injection 2-5 mg  2-5 mg Intravenous Q1H PRN Ronald Lobo, MD   2 mg at 04/24/20 2209  . multivitamin with minerals tablet 1 tablet  1 tablet Oral Daily Waynetta Sandy, MD   1 tablet at 04/25/20 [phone]  . nicotine (NICODERM CQ - dosed in mg/24 hours) patch 14 mg  14 mg Transdermal Daily Waynetta Sandy, MD   14 mg at 04/25/20 0840  . ondansetron (ZOFRAN) injection 4 mg  4 mg Intravenous Q6H PRN Buccini, Robert, MD      . oxyCODONE (Oxy IR/ROXICODONE) immediate release tablet 5-10 mg  5-10 mg Oral Q4H PRN Ronald Lobo, MD   5 mg at 04/24/20 1833  . PARoxetine (PAXIL) tablet 20 mg  20 mg Oral Daily Barbie Banner, PA-C   20 mg at 04/25/20 2774  . phenol (CHLORASEPTIC) mouth spray 1 spray  1 spray Mouth/Throat PRN Ronald Lobo, MD   1 spray at 04/21/20 1457  . polyethylene glycol (MIRALAX / GLYCOLAX) packet 17 g  17 g Oral Daily PRN Buccini, Robert, MD      . potassium chloride SA (KLOR-CON) CR tablet 20 mEq  20 mEq Oral Daily Barbie Banner, PA-C   20 mEq at 04/25/20 0839  . potassium chloride SA (KLOR-CON) CR tablet 20-40 mEq  20-40 mEq Oral Daily PRN Buccini, Robert, MD      . sodium chloride flush (NS) 0.9 % injection 10-40 mL  10-40 mL Intracatheter Q12H Buccini, Robert, MD   10 mL at 04/25/20 0841  . sodium chloride flush (NS) 0.9 % injection 10-40 mL  10-40 mL Intracatheter PRN Buccini, Herbie [place], MD         Discharge Medications: Please see discharge summary for a list of discharge medications.  Relevant Imaging Results:  Relevant Lab Results:   Additional Information SSN  [id number]  Jossie Smoot J Riddick, [place]

## 2020-04-25 NOTE — Progress Notes (Signed)
Inpatient Rehabilitation Admissions Coordinator  I spoke with pt's spouse, Carlos American by phone and discussed cost of care for CIR if approved by insurance. He prefers SNF at [facility] due to cost. I have alerted [facility] team and we will sign off.  Danne Baxter, RN, MSN Rehab Admissions Coordinator [phone] 04/25/2020 1:12 PM

## 2020-04-25 NOTE — Progress Notes (Signed)
[facility] Progress Note  6 Days Post-Op  Subjective: Patient tolerating FLD and having bowel function. Had several loose BMs overnight. Denies abdominal pain or nausea and vomiting. She tells me that she is hungry.  Objective: Vital signs in last 24 hours: Temp:  [97.8 F (36.6 C)-98.7 F (37.1 C)] 98.1 F (36.7 C) (06/24 0840) Pulse Rate:  [75-87] 75 (06/24 0840) Resp:  [13-22] 20 (06/24 0840) BP: (129-168)/(54-77) 156/60 (06/24 0840) SpO2:  [92 %-98 %] 95 % (06/24 0840) Weight:  [90.1 kg-94.3 kg] 90.1 kg (06/24 0434) Last BM Date: 04/25/20  Intake/Output from previous day: 06/23 0701 - 06/24 0700 In: 480 [P.O.:480] Out: 300 [Urine:300] Intake/Output this shift: No intake/output data recorded.  PE: Abd: soft, NT, ND, +BS   Lab Results:  Recent Labs    04/23/20 0934 04/24/20 0341  WBC 15.9* 11.7*  HGB 9.6* 8.4*  HCT 31.0* 27.0*  PLT 197 196   BMET Recent Labs    04/23/20 0934 04/24/20 0341  NA 149* 145  K 3.8 3.4*  CL 117* 116*  CO2 21* 22  GLUCOSE 102* 188*  BUN 44* 34*  CREATININE 1.77* 1.45*  CALCIUM 7.7* 7.3*   PT/INR No results for input(s): LABPROT, INR in the last 72 hours. CMP     Component Value Date/Time   NA 145 04/24/2020 0341   K 3.4 (L) 04/24/2020 0341   CL 116 (H) 04/24/2020 0341   CO2 22 04/24/2020 0341   GLUCOSE 188 (H) 04/24/2020 0341   BUN 34 (H) 04/24/2020 0341   CREATININE 1.45 (H) 04/24/2020 0341   CALCIUM 7.3 (L) 04/24/2020 0341   PROT 4.5 (L) 04/19/2020 0421   ALBUMIN 3.2 (L) 04/19/2020 0421   AST 92 (H) 04/19/2020 0421   ALT 49 (H) 04/19/2020 0421   ALKPHOS 32 (L) 04/19/2020 0421   BILITOT 0.8 04/19/2020 0421   GFRNONAA 37 (L) 04/24/2020 0341   GFRAA 43 (L) 04/24/2020 0341   Lipase  No results found for: LIPASE     Studies/Results: No results found.  Anti-infectives: Anti-infectives (From admission, onward)   Start     Dose/Rate Route Frequency Ordered Stop   04/23/20 1700  ciprofloxacin  (CIPRO) IVPB 400 mg     Discontinue     400 mg 200 mL/hr over 60 Minutes Intravenous Every 12 hours 04/23/20 1523     04/20/20 1600  ciprofloxacin (CIPRO) IVPB 400 mg  Status:  Discontinued        400 mg 200 mL/hr over 60 Minutes Intravenous Every 24 hours 04/19/20 2132 04/23/20 1523   04/19/20 1800  metroNIDAZOLE (FLAGYL) IVPB 500 mg  Status:  Discontinued        500 mg 100 mL/hr over 60 Minutes Intravenous Every 6 hours 04/19/20 1530 04/19/20 1549   04/19/20 1700  metroNIDAZOLE (FLAGYL) IVPB 500 mg     Discontinue     500 mg 100 mL/hr over 60 Minutes Intravenous Every 8 hours 04/19/20 1549     04/19/20 1630  ciprofloxacin (CIPRO) IVPB 400 mg  Status:  Discontinued        400 mg 200 mL/hr over 60 Minutes Intravenous Every 12 hours 04/19/20 1530 04/19/20 2132   04/18/20 0743  vancomycin (VANCOCIN) IVPB 1000 mg/200 mL premix        1,000 mg 200 mL/hr over 60 Minutes Intravenous 60 min pre-op 04/18/20 0743 04/18/20 1900       Assessment/Plan Ischemic colitis of the left/sigmoid colon and proximal rectum - likely chronic  disease exacerbated by recent surgical bypass with ligation of the IMA. - tolerating FLD - having some diarrhea - I think this may improve some with more solid food - abdominal exam benign - advance to soft diet - no indication for general surgery intervention at this time, we will sign off. Please call if we can be of further assistance.   FEN - advance to soft diet  VTE -SQ heparin  ID -C/F  LOS: 7 days    Norm Parcel , [facility] 04/25/2020, 10:12 AM Please see Amion for pager number during day hours 7:00am-4:30pm

## 2020-04-26 LAB — CBC WITH DIFFERENTIAL/PLATELET
Abs Immature Granulocytes: 0.12 10*3/uL — ABNORMAL HIGH (ref 0.00–0.07)
Basophils Absolute: 0 10*3/uL (ref 0.0–0.1)
Basophils Relative: 0 %
Eosinophils Absolute: 0.3 10*3/uL (ref 0.0–0.5)
Eosinophils Relative: 3 %
HCT: 28.1 % — ABNORMAL LOW (ref 36.0–46.0)
Hemoglobin: 8.7 g/dL — ABNORMAL LOW (ref 12.0–15.0)
Immature Granulocytes: 1 %
Lymphocytes Relative: 8 %
Lymphs Abs: 0.8 10*3/uL (ref 0.7–4.0)
MCH: 29.5 pg (ref 26.0–34.0)
MCHC: 31 g/dL (ref 30.0–36.0)
MCV: 95.3 fL (ref 80.0–100.0)
Monocytes Absolute: 0.5 10*3/uL (ref 0.1–1.0)
Monocytes Relative: 5 %
Neutro Abs: 7.8 10*3/uL — ABNORMAL HIGH (ref 1.7–7.7)
Neutrophils Relative %: 83 %
Platelets: 275 10*3/uL (ref 150–400)
RBC: 2.95 MIL/uL — ABNORMAL LOW (ref 3.87–5.11)
RDW: 16.6 % — ABNORMAL HIGH (ref 11.5–15.5)
WBC: 9.5 10*3/uL (ref 4.0–10.5)
nRBC: 0.2 % (ref 0.0–0.2)

## 2020-04-26 LAB — BASIC METABOLIC PANEL
Anion gap: 8 (ref 5–15)
BUN: 17 mg/dL (ref 8–23)
CO2: 22 mmol/L (ref 22–32)
Calcium: 7.4 mg/dL — ABNORMAL LOW (ref 8.9–10.3)
Chloride: 110 mmol/L (ref 98–111)
Creatinine, Ser: 1.15 mg/dL — ABNORMAL HIGH (ref 0.44–1.00)
GFR calc Af Amer: 57 mL/min — ABNORMAL LOW (ref >60)
GFR calc non Af Amer: 49 mL/min — ABNORMAL LOW (ref >60)
Glucose, Bld: 131 mg/dL — ABNORMAL HIGH (ref 70–99)
Potassium: 3.5 mmol/L (ref 3.5–5.1)
Sodium: 140 mmol/L (ref 135–145)

## 2020-04-26 LAB — SARS CORONAVIRUS 2 (TAT 6-24 HRS): SARS Coronavirus 2: NEGATIVE

## 2020-04-26 LAB — GLUCOSE, CAPILLARY
Glucose-Capillary: 112 mg/dL — ABNORMAL HIGH (ref 70–99)
Glucose-Capillary: 119 mg/dL — ABNORMAL HIGH (ref 70–99)
Glucose-Capillary: 121 mg/dL — ABNORMAL HIGH (ref 70–99)
Glucose-Capillary: 143 mg/dL — ABNORMAL HIGH (ref 70–99)
Glucose-Capillary: 144 mg/dL — ABNORMAL HIGH (ref 70–99)
Glucose-Capillary: 151 mg/dL — ABNORMAL HIGH (ref 70–99)

## 2020-04-26 MED ORDER — METRONIDAZOLE 500 MG PO TABS
500.0000 mg | ORAL_TABLET | Freq: Three times a day (TID) | ORAL | Status: DC
  Administered 2020-04-26 – 2020-04-29 (×9): 500 mg via ORAL
  Filled 2020-04-26 (×9): qty 1

## 2020-04-26 MED ORDER — CIPROFLOXACIN HCL 500 MG PO TABS
500.0000 mg | ORAL_TABLET | Freq: Two times a day (BID) | ORAL | Status: DC
  Administered 2020-04-26 – 2020-04-29 (×7): 500 mg via ORAL
  Filled 2020-04-26 (×7): qty 1

## 2020-04-26 NOTE — Progress Notes (Signed)
PHARMACIST - PHYSICIAN COMMUNICATION DR:   Donzetta Matters CONCERNING: Antibiotic IV to Oral Route Change Policy  RECOMMENDATION: This patient is receiving Cipro/flagyl by the intravenous route.  Based on criteria approved by the Pharmacy and Therapeutics Committee, the antibiotic(s) is/are being converted to the equivalent oral dose form(s).   DESCRIPTION: These criteria include:  Patient being treated for a respiratory tract infection, urinary tract infection, cellulitis or clostridium difficile associated diarrhea if on metronidazole  The patient is not neutropenic and does not exhibit a GI malabsorption state  The patient is eating (either orally or via tube) and/or has been taking other orally administered medications for a least 24 hours  The patient is improving clinically and has a Tmax < 100.5  If you have questions about this conversion, please contact the Pharmacy Department  []   [phone] )  [facility] []   [phone] )  [facility] [x]   [phone] )  [facility] []   [phone] )  [facility] []   [phone] )  [facility]

## 2020-04-26 NOTE — Progress Notes (Signed)
Physical Therapy Treatment Patient Details Name: Tonya Hughes MRN: [id number] DOB: 1951-07-27 Today's Date: 04/26/2020    History of Present Illness 69 yo female with aortoiliac occulsive disease admitted for aortobifem BPG with significant blood loss operatively remained intubated post op. Ischemic colitis post op. Extubated 6/20. PMHx: CAD, PVD, systolic heart failure, HTN, DM    PT Comments    Patient progressing slowly towards PT goals. Irritable upon arrival with pt sitting on BSC. Wanting to eat breakfast and get dressed to leave despite not having d/c orders. Min A to stand from [facility] and for gait training. Pt self limiting due to above. Noted to have pain at incision in abdomen. Noted to have cognitive deficits relating to attention, memory, safety, awareness. Continue to recommend SNF. Will follow.   Follow Up Recommendations  CIR     Equipment Recommendations  3in1 (PT)    Recommendations for Other Services       Precautions / Restrictions Precautions Precautions: Fall Restrictions Weight Bearing Restrictions: No    Mobility  Bed Mobility               General bed mobility comments: Sitting on BSC upon PT arrival.  Transfers Overall transfer level: Needs assistance Equipment used: Rolling walker (2 wheeled) Transfers: Sit to/from Stand Sit to Stand: Min assist;+2 safety/equipment         General transfer comment: assist to power to standing with cues for hand placement, transferred to chair post ambulation. "why doesn't anyone let me sit the way I want to sit?" with bottom too far forward.  Ambulation/Gait Ambulation/Gait assistance: Min assist;+2 safety/equipment Gait Distance (Feet): 12 Feet Assistive device: Rolling walker (2 wheeled) Gait Pattern/deviations: Step-through pattern;Decreased stride length;Trunk flexed;Wide base of support Gait velocity: decreased Gait velocity interpretation: <1.8 ft/sec, indicate of risk for recurrent falls General  Gait Details: Slow, unsteady gait with decreased foot clearance on LLE; limited due to wanting to eat breakfast. Cues for posture and positioning with RW.   Stairs             Wheelchair Mobility    Modified Rankin (Stroke Patients Only)       Balance Overall balance assessment: Needs assistance Sitting-balance support: Feet supported;No upper extremity supported Sitting balance-Leahy Scale: Fair     Standing balance support: During functional activity Standing balance-Leahy Scale: Poor Standing balance comment: bil UE support on RW                            Cognition Arousal/Alertness: Awake/alert Behavior During Therapy: WFL for tasks assessed/performed Overall Cognitive Status: Impaired/Different from baseline Area of Impairment: Attention;Memory;Following commands;Safety/judgement;Awareness;Problem solving                   Current Attention Level: Sustained Memory: Decreased short-term memory Following Commands: Follows one step commands with increased time Safety/Judgement: Decreased awareness of safety;Decreased awareness of deficits Awareness: Intellectual Problem Solving: Slow processing;Decreased initiation;Requires verbal cues General Comments: Pt irritated upon arrival and short with nurse present and this PT. Adamant about wanting to eat despite sitting on toilet having BM and needing to be cleaned up. Poor awareness of situation. "get me my clothes so I can get dressed, I am leaving today" with no d/c orders present.      Exercises      General Comments        Pertinent Vitals/Pain Pain Assessment: Faces Faces Pain Scale: Hurts little more Pain Location: incision Pain Descriptors /  Indicators: Sore;Aching Pain Intervention(s): Monitored during session;Repositioned;Limited activity within patient's tolerance    Home Living                      Prior Function            PT Goals (current goals can now be found  in the care plan section) Progress towards PT goals: Progressing toward goals (slowly)    Frequency    Min 3X/week      PT Plan Current plan remains appropriate    Co-evaluation              AM-PAC PT "6 Clicks" Mobility   Outcome Measure  Help needed turning from your back to your side while in a flat bed without using bedrails?: A Little Help needed moving from lying on your back to sitting on the side of a flat bed without using bedrails?: A Lot Help needed moving to and from a bed to a chair (including a wheelchair)?: A Little Help needed standing up from a chair using your arms (e.g., wheelchair or bedside chair)?: A Little Help needed to walk in hospital room?: A Little Help needed climbing 3-5 steps with a railing? : Total 6 Click Score: 15    End of Session Equipment Utilized During Treatment: Gait belt Activity Tolerance: Other (comment) (self limiting due to wanting to eat, irritable.) Patient left: in chair;with call bell/phone within reach;with chair alarm set Nurse Communication: Mobility status PT Visit Diagnosis: Other abnormalities of gait and mobility (R26.89);Difficulty in walking, not elsewhere classified (R26.2);Muscle weakness (generalized) (M62.81);Other symptoms and signs involving the nervous system (R29.898);Pain Pain - part of body:  (abdomen)     Time: 4166-0630 PT Time Calculation (min) (ACUTE ONLY): 16 min  Charges:  $Therapeutic Activity: 8-22 mins                     Marisa Severin, PT, DPT Acute Rehabilitation Services Pager [phone] Office [phone]       Tonya Hughes 04/26/2020, 1:07 PM

## 2020-04-26 NOTE — Progress Notes (Addendum)
Progress Note    04/26/2020 7:30 AM 7 Days Post-Op  Subjective: She states p.o. intake is upsetting her stomach.  She continues to have loose bowel movements.  No vomiting.   Vitals:   04/26/20 0024 04/26/20 0310  BP: (!) 133/56 (!) 129/46  Pulse: 74 64  Resp: 15 19  Temp: 98.1 F (36.7 C) (!) 97.4 F (36.3 C)  SpO2: 95% 96%    Physical Exam: Cardiac: Heart rate and rhythm are regular Lungs: Clear to auscultation bilaterally Incisions: Midline incision and both groin incisions are well approximated.  Her left groin incision has an area of ischemic skin edges at the inferior aspect.  There is no erythema or drainage. Extremities: Moves all extremities well.  Motor and sensation intact in both feet.  2+ right DP pulse.  Both feet are well-perfused Abdomen: Soft, mild generalized tenderness to palpation. 600cc UOP CBC    Component Value Date/Time   WBC 11.7 (H) 04/24/2020 0341   RBC 2.88 (L) 04/24/2020 0341   HGB 8.4 (L) 04/24/2020 0341   HCT 27.0 (L) 04/24/2020 0341   PLT 196 04/24/2020 0341   MCV 93.8 04/24/2020 0341   MCH 29.2 04/24/2020 0341   MCHC 31.1 04/24/2020 0341   RDW 16.6 (H) 04/24/2020 0341   LYMPHSABS 0.6 (L) 04/24/2020 0341   MONOABS 0.5 04/24/2020 0341   EOSABS 0.3 04/24/2020 0341   BASOSABS 0.0 04/24/2020 0341    BMET    Component Value Date/Time   NA 145 04/24/2020 0341   K 3.4 (L) 04/24/2020 0341   CL 116 (H) 04/24/2020 0341   CO2 22 04/24/2020 0341   GLUCOSE 188 (H) 04/24/2020 0341   BUN 34 (H) 04/24/2020 0341   CREATININE 1.45 (H) 04/24/2020 0341   CALCIUM 7.3 (L) 04/24/2020 0341   GFRNONAA 37 (L) 04/24/2020 0341   GFRAA 43 (L) 04/24/2020 0341     Intake/Output Summary (Last 24 hours) at 04/26/2020 0730 Last data filed at 04/25/2020 1814 Gross per 24 hour  Intake 120 ml  Output 600 ml  Net -480 ml    HOSPITAL MEDICATIONS Scheduled Meds: . sodium chloride   Intravenous Once  . albuterol  2.5 mg Nebulization BID  . arformoterol   15 mcg Nebulization BID  . aspirin EC  81 mg Oral Daily  . atorvastatin  40 mg Oral Daily  . budesonide (PULMICORT) nebulizer solution  0.5 mg Nebulization BID  . carvedilol  3.125 mg Oral BID WC  . Chlorhexidine Gluconate Cloth  6 each Topical Daily  . feeding supplement (ENSURE ENLIVE)  237 mL Oral BID BM  . furosemide  40 mg Oral Daily  . heparin  5,000 Units Subcutaneous Q8H  . insulin aspart  0-15 Units Subcutaneous Q4H  . lisinopril  5 mg Oral Daily  . loratadine  10 mg Oral Daily  . mouth rinse  15 mL Mouth Rinse BID  . multivitamin with minerals  1 tablet Oral Daily  . nicotine  14 mg Transdermal Daily  . PARoxetine  20 mg Oral Daily  . potassium chloride SA  20 mEq Oral Daily  . sodium chloride flush  10-40 mL Intracatheter Q12H   Continuous Infusions: . sodium chloride    . sodium chloride 10 mL/hr at 04/19/20 2200  . sodium chloride Stopped (04/18/20 1739)  . sodium chloride Stopped (04/23/20 1019)  . ciprofloxacin 400 mg (04/26/20 0428)  . famotidine (PEPCID) IV 20 mg (04/25/20 2146)  . metronidazole 500 mg (04/26/20 0059)   PRN  Meds:.sodium chloride, acetaminophen **OR** acetaminophen, alum & mag hydroxide-simeth, bisacodyl, diphenhydrAMINE, docusate sodium, fentaNYL (SUBLIMAZE) injection, guaiFENesin-dextromethorphan, hydrALAZINE, labetalol, metoprolol tartrate, morphine injection, ondansetron, oxyCODONE, phenol, polyethylene glycol, potassium chloride, sodium chloride flush  Assessment: POD 7 status post aortobifemoral bypass.  She is tolerating her diet.  Hemodynamically stable.  Acute blood loss anemia.    Ischemic colitis.  Antibiotics continue and leukocytosis continues to trend downward.  No left shift.  No significant abdominal pain. Tolerating diet.   Plan: Continue current treatment plan and mobilization.  Update labs.  Discontinue right IJ line in place peripheral IV.  She says she has plans to transfer to rehab in [facility].  States she has  benefits that will pay. - -DVT prophylaxis: Heparin subcutaneously   Risa Grill, PA-C [facility] [phone] 04/26/2020  7:30 AM   I have independently interviewed and examined patient and agree with PA assessment and plan above.  We will continue antibiotics until leukocytosis and abdominal pain have fully resolved.  Continue to mobilize and disposition will be SNF.  Najir Roop C. Donzetta Matters, MD [facility] [place] Office: [phone] Pager: [phone]

## 2020-04-26 NOTE — Progress Notes (Signed)
OT Cancellation Note  Patient Details Name: Tonya Hughes MRN: [id number] DOB: 08/22/1951   Cancelled Treatment:    Reason Eval/Treat Not Completed: Patient declined, no reason specified  Malka So 04/26/2020, 1:05 PM  Nestor Lewandowsky, OTR/L Acute Rehabilitation Services Pager: [phone] Office: [phone]

## 2020-04-26 NOTE — TOC Progression Note (Signed)
Transition of Care [facility]) - Progression Note    Patient Details  Name: Tonya Hughes MRN: [id number] Date of Birth: Aug 23, 1951  Transition of Care [facility]) CM/SW [place], [place] Phone Number: 04/26/2020, 4:50 PM  Clinical Narrative:     CSW spoke with patient's spouse,Gurney. CSW introduced self and explained role. CSW discussed recommendation of short rehab at [facility]. Carlos American sates he was in agreement PT recommendation. CSW explained the SNF process. [facility] is the preferred SNF. CSW contacted [facility] and they confirmed bed offer. Patient has not received covid vaccines. CSW informed patient's spouse she will have to be quarantine for 14 days/no outside visitors.   CSW will start insurance authorization.   Thurmond Butts, MSW, [place] Clinical Social Worker   Expected Discharge Plan: Skilled Nursing Facility Barriers to Discharge: Continued Medical Work up, Ship broker  Expected Discharge Plan and Services Expected Discharge Plan: [place] In-house Referral: Clinical Social Work     Living arrangements for the past 2 months: Single Family Home                                       Social Determinants of Health (SDOH) Interventions    Readmission Risk Interventions No flowsheet data found.

## 2020-04-26 NOTE — Progress Notes (Signed)
Inpatient Diabetes Program Recommendations  AACE/ADA: New Consensus Statement on Inpatient Glycemic Control (2015)  Target Ranges:  Prepandial:   less than 140 mg/dL      Peak postprandial:   less than 180 mg/dL (1-2 hours)      Critically ill patients:  140 - 180 mg/dL   Lab Results  Component Value Date   GLUCAP 121 (H) 04/26/2020   HGBA1C 6.9 (H) 04/16/2020    Review of Glycemic Control  Diabetes history: None Current orders for Inpatient glycemic control: Novolog 0-15 units Q4 hours  Ensure Enlive bid between meals   Spoke with pt regarding A1c level of 6.9% this admission (meeting new Diagnosis criteria for DM).  Pt reports her husband has had DM for years. Discussed how to eat and to try to be active if she is able to after rehab.  Discussed follow up after hospital and close DM follow up with her PCP after that.  -  MD could choose to start Glimepiride 1 mg Daily (would not do metformin due to GI symptoms currently) at time of d/c if its desired for pt to be on medication.  Thanks,  Tama Headings RN, MSN, BC-ADM Inpatient Diabetes Coordinator Team Pager [phone] (8a-5p)

## 2020-04-27 LAB — GLUCOSE, CAPILLARY
Glucose-Capillary: 101 mg/dL — ABNORMAL HIGH (ref 70–99)
Glucose-Capillary: 119 mg/dL — ABNORMAL HIGH (ref 70–99)
Glucose-Capillary: 122 mg/dL — ABNORMAL HIGH (ref 70–99)
Glucose-Capillary: 124 mg/dL — ABNORMAL HIGH (ref 70–99)
Glucose-Capillary: 133 mg/dL — ABNORMAL HIGH (ref 70–99)
Glucose-Capillary: 137 mg/dL — ABNORMAL HIGH (ref 70–99)

## 2020-04-27 MED ORDER — DIPHENHYDRAMINE HCL 25 MG PO CAPS: 25.0000 mg | ORAL_CAPSULE | Freq: Four times a day (QID) | ORAL | Status: DC | PRN

## 2020-04-27 MED ORDER — FAMOTIDINE 20 MG PO TABS
20.0000 mg | ORAL_TABLET | Freq: Every day | ORAL | Status: DC
  Administered 2020-04-27 – 2020-04-28 (×2): 20 mg via ORAL
  Filled 2020-04-27 (×2): qty 1

## 2020-04-27 NOTE — Progress Notes (Addendum)
  Progress Note    04/27/2020 12:20 PM 8 Days Post-Op  Subjective:  Says her belly feels somewhat ok today but hasn't eaten much.  Says she is wet from pee.  Afebrile HR 80'D-98'P NSR 382'N systolic 05% RA  Vitals:   04/27/20 0830 04/27/20 1123  BP: (!) 126/44 122/85  Pulse: 64 61  Resp: 17 16  Temp: 97.6 F (36.4 C) 98 F (36.7 C)  SpO2: 90% 90%    Physical Exam: Cardiac:  regular Lungs:  Non labored Incisions:  Midline and bilateral groin incisions look good with staples in tact.  Abdomen:  Soft, NT ND; +BM  CBC    Component Value Date/Time   WBC 9.5 04/26/2020 0830   RBC 2.95 (L) 04/26/2020 0830   HGB 8.7 (L) 04/26/2020 0830   HCT 28.1 (L) 04/26/2020 0830   PLT 275 04/26/2020 0830   MCV 95.3 04/26/2020 0830   MCH 29.5 04/26/2020 0830   MCHC 31.0 04/26/2020 0830   RDW 16.6 (H) 04/26/2020 0830   LYMPHSABS 0.8 04/26/2020 0830   MONOABS 0.5 04/26/2020 0830   EOSABS 0.3 04/26/2020 0830   BASOSABS 0.0 04/26/2020 0830    BMET    Component Value Date/Time   NA 140 04/26/2020 0830   K 3.5 04/26/2020 0830   CL 110 04/26/2020 0830   CO2 22 04/26/2020 0830   GLUCOSE 131 (H) 04/26/2020 0830   BUN 17 04/26/2020 0830   CREATININE 1.15 (H) 04/26/2020 0830   CALCIUM 7.4 (L) 04/26/2020 0830   GFRNONAA 49 (L) 04/26/2020 0830   GFRAA 57 (L) 04/26/2020 0830    INR    Component Value Date/Time   INR 1.5 (H) 04/18/2020 1648     Intake/Output Summary (Last 24 hours) at 04/27/2020 1220 Last data filed at 04/27/2020 0750 Gross per 24 hour  Intake 480 ml  Output 200 ml  Net 280 ml     Assessment:  69 y.o. female is s/p:  Aortobifemoral bypass grafting  8 Days Post-Op  Plan: -says her abdominal pain is a little better today but hasn't eaten much.  Pt is afebrile.  There were no labs drawn today.  Will check CBC tomorrow.  Continue abx and current treatment plan. -encouraged pt to be up out of bed and to chair.  Does not want to be in chair bc it is  uncomfortable as she has had several back surgeries in the past.  She states she is willing to be sitting up on the side of the bed.  Needs to mobilize. -DVT prophylaxis:  Sq heparin -disposition for SNF when medically ready   Leontine Locket, PA-C [facility] [phone] 04/27/2020 12:20 PM  I have interviewed the patient and examined the patient. I agree with the findings by the PA. Early pressure sore on left heel. Float heels to prevent pressure sores.   Gae Gallop, MD [phone]

## 2020-04-28 LAB — CBC
HCT: 27.7 % — ABNORMAL LOW (ref 36.0–46.0)
Hemoglobin: 8.6 g/dL — ABNORMAL LOW (ref 12.0–15.0)
MCH: 29.5 pg (ref 26.0–34.0)
MCHC: 31 g/dL (ref 30.0–36.0)
MCV: 94.9 fL (ref 80.0–100.0)
Platelets: 340 10*3/uL (ref 150–400)
RBC: 2.92 MIL/uL — ABNORMAL LOW (ref 3.87–5.11)
RDW: 16.4 % — ABNORMAL HIGH (ref 11.5–15.5)
WBC: 9 10*3/uL (ref 4.0–10.5)
nRBC: 0.2 % (ref 0.0–0.2)

## 2020-04-28 LAB — GLUCOSE, CAPILLARY
Glucose-Capillary: 112 mg/dL — ABNORMAL HIGH (ref 70–99)
Glucose-Capillary: 113 mg/dL — ABNORMAL HIGH (ref 70–99)
Glucose-Capillary: 130 mg/dL — ABNORMAL HIGH (ref 70–99)
Glucose-Capillary: 145 mg/dL — ABNORMAL HIGH (ref 70–99)
Glucose-Capillary: 146 mg/dL — ABNORMAL HIGH (ref 70–99)
Glucose-Capillary: 153 mg/dL — ABNORMAL HIGH (ref 70–99)
Glucose-Capillary: 99 mg/dL (ref 70–99)

## 2020-04-28 NOTE — TOC Progression Note (Signed)
Transition of Care [facility]) - Progression Note    Patient Details  Name: Tonya Hughes MRN: [id number] Date of Birth: 07/09/1951  Transition of Care [facility]) CM/SW [place], [place] Phone Number: 04/28/2020, 12:49 PM  Clinical Narrative:     Insurance still pending. Reference # [id number]  Thurmond Butts, MSW, [place] Clinical Social Worker   Expected Discharge Plan: Skilled Nursing Facility Barriers to Discharge: Continued Medical Work up, Ship broker  Expected Discharge Plan and Services Expected Discharge Plan: [place] In-house Referral: Clinical Social Work     Living arrangements for the past 2 months: Single Family Home                                       Social Determinants of Health (SDOH) Interventions    Readmission Risk Interventions No flowsheet data found.

## 2020-04-28 NOTE — Progress Notes (Signed)
Pt is sitting on the side of the bed after using the bedside commode. The NT and this RN explain to pt our safety concern and why we cannot leave her by herself on the side of the bed. Pt seems not to understand our concerns and is not complying with the safety measures put in place per policy. Pt is verbally abusive to the nursing staff.

## 2020-04-28 NOTE — Progress Notes (Addendum)
  Progress Note    04/28/2020 8:08 AM 9 Days Post-Op  Subjective:  Sleeping-wakes easily.  No complaints.  Says she feels better.   Afebrile HR 50's-70's NSR 888'B-169'I systolic 50% RA  Vitals:   04/28/20 0000 04/28/20 0400  BP: (!) 137/41 (!) 133/36  Pulse: 68 62  Resp: 16 17  Temp: 97.6 F (36.4 C) 98.2 F (36.8 C)  SpO2: 92% 93%    Physical Exam: Cardiac:  regular Lungs:  Non labored  Incisions:  Incisions healing nicely with staples in tact.  There appears to be some serosanguinous drainage from the proximal portion of the left groin.  Some dark areas around distal incision left groin. Extremities:  Brisk doppler signals bilateral DP  Abdomen:  Soft, NT/ND; +flatus/BM  CBC    Component Value Date/Time   WBC 9.0 04/28/2020 0158   RBC 2.92 (L) 04/28/2020 0158   HGB 8.6 (L) 04/28/2020 0158   HCT 27.7 (L) 04/28/2020 0158   PLT 340 04/28/2020 0158   MCV 94.9 04/28/2020 0158   MCH 29.5 04/28/2020 0158   MCHC 31.0 04/28/2020 0158   RDW 16.4 (H) 04/28/2020 0158   LYMPHSABS 0.8 04/26/2020 0830   MONOABS 0.5 04/26/2020 0830   EOSABS 0.3 04/26/2020 0830   BASOSABS 0.0 04/26/2020 0830    BMET    Component Value Date/Time   NA 140 04/26/2020 0830   K 3.5 04/26/2020 0830   CL 110 04/26/2020 0830   CO2 22 04/26/2020 0830   GLUCOSE 131 (H) 04/26/2020 0830   BUN 17 04/26/2020 0830   CREATININE 1.15 (H) 04/26/2020 0830   CALCIUM 7.4 (L) 04/26/2020 0830   GFRNONAA 49 (L) 04/26/2020 0830   GFRAA 57 (L) 04/26/2020 0830    INR    Component Value Date/Time   INR 1.5 (H) 04/18/2020 1648     Intake/Output Summary (Last 24 hours) at 04/28/2020 3888 Last data filed at 04/28/2020 0447 Gross per 24 hour  Intake --  Output 600 ml  Net -600 ml     Assessment:  69 y.o. female is s/p:  Aortobifemoral bypass grafting  9 Days Post-Op  Plan: -brisk doppler signals bilateral DP  -continue to float heels  -No leukocytosis & pt afebrile and tolerating diet but  still having loose stool per pt so will continue flagyl/cipro for now. -some serosanguinous drainage from left groin and darkened areas around staples distally.  Continue to monitor -CBC stable this morning -continue to mobilize -DVT prophylaxis:  Sq heparin -SNF when pt medically ready   Leontine Locket, PA-C [facility] [phone] 04/28/2020 8:08 AM  I have interviewed the patient and examined the patient. I agree with the findings by the PA.  Left groin incision with minimal drainage.  Hopefully she can get to a skilled nursing facility tomorrow.  She is anxious to get out of the hospital.  Gae Gallop, MD [phone]

## 2020-04-29 DIAGNOSIS — A0472 Enterocolitis due to Clostridium difficile, not specified as recurrent: Secondary | ICD-10-CM | POA: Diagnosis not present

## 2020-04-29 DIAGNOSIS — R197 Diarrhea, unspecified: Secondary | ICD-10-CM | POA: Diagnosis not present

## 2020-04-29 DIAGNOSIS — I779 Disorder of arteries and arterioles, unspecified: Secondary | ICD-10-CM | POA: Diagnosis not present

## 2020-04-29 DIAGNOSIS — M6281 Muscle weakness (generalized): Secondary | ICD-10-CM | POA: Diagnosis not present

## 2020-04-29 DIAGNOSIS — I82409 Acute embolism and thrombosis of unspecified deep veins of unspecified lower extremity: Secondary | ICD-10-CM | POA: Diagnosis not present

## 2020-04-29 DIAGNOSIS — R279 Unspecified lack of coordination: Secondary | ICD-10-CM | POA: Diagnosis not present

## 2020-04-29 DIAGNOSIS — S32009A Unspecified fracture of unspecified lumbar vertebra, initial encounter for closed fracture: Secondary | ICD-10-CM | POA: Diagnosis not present

## 2020-04-29 DIAGNOSIS — Z743 Need for continuous supervision: Secondary | ICD-10-CM | POA: Diagnosis not present

## 2020-04-29 DIAGNOSIS — I10 Essential (primary) hypertension: Secondary | ICD-10-CM | POA: Diagnosis not present

## 2020-04-29 DIAGNOSIS — T8189XA Other complications of procedures, not elsewhere classified, initial encounter: Secondary | ICD-10-CM | POA: Diagnosis not present

## 2020-04-29 DIAGNOSIS — R2689 Other abnormalities of gait and mobility: Secondary | ICD-10-CM | POA: Diagnosis not present

## 2020-04-29 DIAGNOSIS — Z48812 Encounter for surgical aftercare following surgery on the circulatory system: Secondary | ICD-10-CM | POA: Diagnosis not present

## 2020-04-29 DIAGNOSIS — M545 Low back pain: Secondary | ICD-10-CM | POA: Diagnosis not present

## 2020-04-29 DIAGNOSIS — I2510 Atherosclerotic heart disease of native coronary artery without angina pectoris: Secondary | ICD-10-CM | POA: Diagnosis not present

## 2020-04-29 DIAGNOSIS — R58 Hemorrhage, not elsewhere classified: Secondary | ICD-10-CM | POA: Diagnosis not present

## 2020-04-29 DIAGNOSIS — I110 Hypertensive heart disease with heart failure: Secondary | ICD-10-CM | POA: Diagnosis not present

## 2020-04-29 DIAGNOSIS — Z299 Encounter for prophylactic measures, unspecified: Secondary | ICD-10-CM | POA: Diagnosis not present

## 2020-04-29 DIAGNOSIS — R6889 Other general symptoms and signs: Secondary | ICD-10-CM | POA: Diagnosis not present

## 2020-04-29 DIAGNOSIS — Z48815 Encounter for surgical aftercare following surgery on the digestive system: Secondary | ICD-10-CM | POA: Diagnosis not present

## 2020-04-29 DIAGNOSIS — I509 Heart failure, unspecified: Secondary | ICD-10-CM | POA: Diagnosis not present

## 2020-04-29 LAB — SARS CORONAVIRUS 2 BY RT PCR (HOSPITAL ORDER, PERFORMED IN ~~LOC~~ HOSPITAL LAB): SARS Coronavirus 2: NEGATIVE

## 2020-04-29 LAB — GLUCOSE, CAPILLARY
Glucose-Capillary: 123 mg/dL — ABNORMAL HIGH (ref 70–99)
Glucose-Capillary: 128 mg/dL — ABNORMAL HIGH (ref 70–99)
Glucose-Capillary: 127 mg/dL — ABNORMAL HIGH (ref 70–99)

## 2020-04-29 MED ORDER — ARFORMOTEROL TARTRATE 15 MCG/2ML IN NEBU: 15.0000 ug | INHALATION_SOLUTION | Freq: Two times a day (BID) | RESPIRATORY_TRACT | Status: AC

## 2020-04-29 MED ORDER — HYDROCODONE-ACETAMINOPHEN 5-325 MG PO TABS: 1.0000 | ORAL_TABLET | Freq: Four times a day (QID) | ORAL | 0 refills | Status: DC | PRN

## 2020-04-29 MED ORDER — ALBUTEROL SULFATE (2.5 MG/3ML) 0.083% IN NEBU: 2.5000 mg | INHALATION_SOLUTION | Freq: Two times a day (BID) | RESPIRATORY_TRACT | Status: AC

## 2020-04-29 NOTE — Progress Notes (Signed)
Pt refusing afternoon CBG checks as well as meds, citing nausea. Refuses zofran.  IV removed and monitors removed for pending PTAR transfer to SNF.  Attempted to call report to [facility], held for several minutes with no answer.  Will reattempt prior to transfer.

## 2020-04-29 NOTE — Discharge Instructions (Signed)
[facility]  Discharge Instructions   Open Aortic Surgery  Please refer to the following instructions for your post-procedure care. Your surgeon or Physician Assistant will discuss any changes with you.  Activity  Avoid lifting more than eight pounds (a gallon of milk) until after your first post-operative visit. You are encouraged to walk as much as you can. You can slowly return to normal activities but must avoid strenuous activity and heavy lifting until your doctor tells you it's okay. Heavy lifting can hurt the incision and cause a hernia. Avoid activities such as vacuuming or swinging a golf club. It is normal to feel tired for several weeks after your surgery. Do not drive until your doctor gives the okay and you are no longer taking prescription pain medications. It is also normal to have difficulty with sleep habits, eating and bowl movements after surgery. These will go away with time.  Bathing/Showering  Shower daily after you go home. Do not soak in a bathtub, hot tub, or swim until the incision heals.  Incision Care  Shower every day. Clean your incision with mild soap and water. Pat the area dry with a clean towel. You do not need a bandage unless otherwise instructed. Do not apply any ointments or creams to your incision. You may have skin glue on your incision. Do not peel it off. It will come off on its own in about one week. If you have staples or sutures along your incision, they will be removed at your post op appointment.  If you have groin incisions, wash the groin wounds with soap and water daily and pat dry. (No tub bath-only shower)  Then put a dry gauze or washcloth in the groin to keep this area dry to help prevent wound infection.  Do this daily and as needed.  Do not use Vaseline or neosporin on your incisions.  Only use soap and water on your incisions and then protect and keep dry.  PLEASE FLOAT HEELS OFF BED.  Diet  Resume  your normal diet. There are no special food restriction following this procedure. A low fat/low cholesterol diet is recommended for all patients with vascular disease. After your aortic surgery, it's normal to feel full faster than usual and to not feel as hungry as you normally would. You will probably lose weight initially following your surgery. It's best to eat small, frequent meals over the course of the day. Call the office if you find that you are unable to eat even small meals.   In order to heal from your surgery, it is CRITICAL to get adequate nutrition. Your body requires vitamins, minerals, and protein. Vegetables are the best source of vitamins and minerals. If you have pain, you may take over-the-counter pain reliever such as acetaminophen (Tylenol). If you were prescribed a stronger pain medication, please be aware these medication can cause nausea and constipation. Prevent nausea by taking the medication with a snack or meal. Avoid constipation by drinking plenty of fluids and eating foods with a high amount of fiber, such as fruits, vegetables and grains. Take 100mg  of the over-the-counter stool softener Colace twice a day as needed to help with constipation. A laxative, such as Milk of Magnesia, may be recommended for you at this time. Do not take a laxative unless your surgeon or P.A. tells you it's OK.  Do not take Tylenol if you are taking stronger pain medications (such as Percocet).  Follow Up  Our  office will schedule a follow up appointment 2-3 weeks after discharge.  Please call us immediately for any of the following conditions    .     Severe or worsening pain in your legs or feet or in your abdomen back or chest. Increased pain, redness drainage (pus) from your incision site. Increased abdominal pain, bloating, nausea, vomiting, or persistent diarrhea. Fever of 101 degrees or higher. Swelling in your leg (s).  Reduce your risk of vascular disease  Stop smoking. If you  would like help, call [facility] at 1-800-QUIT-NOW (832)408-3122) or [facility] at (478) 266-6053. Manage your cholesterol Maintain a desired weight Control your diabetes Keep your blood pressure down  If you have any questions please call the office at [phone].

## 2020-04-29 NOTE — TOC Progression Note (Addendum)
Transition of Care [facility]) - Progression Note    Patient Details  Name: Tonya Hughes MRN: [id number] Date of Birth: 01-Jul-1951  Transition of Care [facility]) CM/SW [place], [place] Phone Number: 04/29/2020, 11:33 AM  Clinical Narrative:     Patient will nee covid test prior to discharge to SNF-RN informed covid test needed  Received insurance authorization # [id number] reference # [id number] from 6/28-6/30.   Thurmond Butts, MSW, [place] Clinical Social Worker   Expected Discharge Plan: Skilled Nursing Facility Barriers to Discharge: Barriers Resolved  Expected Discharge Plan and Services Expected Discharge Plan: [place] In-house Referral: Clinical Social Work     Living arrangements for the past 2 months: Single Family Home Expected Discharge Date: 04/29/20                                     Social Determinants of Health (SDOH) Interventions    Readmission Risk Interventions No flowsheet data found.

## 2020-04-29 NOTE — Progress Notes (Addendum)
  Progress Note    04/29/2020 7:48 AM 10 Days Post-Op  Subjective:  Sitting on the side of the bed eating breakfast. Says she needs to use the potty.  Afebrile HR 50's-70's  371'I-967'E systolic 93% RA  Vitals:   04/29/20 0318 04/29/20 0422  BP:  (!) 124/47  Pulse:  68  Resp: 18 17  Temp:  97.7 F (36.5 C)  SpO2:  95%    Physical Exam: General:  Sitting on side of the bed eating breakfast Lungs:  Non labored   CBC    Component Value Date/Time   WBC 9.0 04/28/2020 0158   RBC 2.92 (L) 04/28/2020 0158   HGB 8.6 (L) 04/28/2020 0158   HCT 27.7 (L) 04/28/2020 0158   PLT 340 04/28/2020 0158   MCV 94.9 04/28/2020 0158   MCH 29.5 04/28/2020 0158   MCHC 31.0 04/28/2020 0158   RDW 16.4 (H) 04/28/2020 0158   LYMPHSABS 0.8 04/26/2020 0830   MONOABS 0.5 04/26/2020 0830   EOSABS 0.3 04/26/2020 0830   BASOSABS 0.0 04/26/2020 0830    BMET    Component Value Date/Time   NA 140 04/26/2020 0830   K 3.5 04/26/2020 0830   CL 110 04/26/2020 0830   CO2 22 04/26/2020 0830   GLUCOSE 131 (H) 04/26/2020 0830   BUN 17 04/26/2020 0830   CREATININE 1.15 (H) 04/26/2020 0830   CALCIUM 7.4 (L) 04/26/2020 0830   GFRNONAA 49 (L) 04/26/2020 0830   GFRAA 57 (L) 04/26/2020 0830    INR    Component Value Date/Time   INR 1.5 (H) 04/18/2020 1648     Intake/Output Summary (Last 24 hours) at 04/29/2020 0748 Last data filed at 04/28/2020 1831 Gross per 24 hour  Intake 560 ml  Output 800 ml  Net -240 ml     Assessment:  69 y.o. female is s/p:  Aortobifemoral bypass grafting  10 Days Post-Op  Plan: -pt sitting on the side of the bed eating breakfast.  Looks good this morning. -did not interrupt her breakfast to examine abdomen and legs.  Will check back late-awaiting insurance approval for SNF -will d/w Dr. Donzetta Matters about flagyl and cirpro   Leontine Locket, PA-C [facility] [phone] 04/29/2020 7:48 AM  I have independently interviewed and examined patient  and agree with PA assessment and plan above.  Leukocytosis and abdominal pain have resolved.  We will discontinue antibiotics and plan for discharge after insurance approval for SNF.  Arrie Borrelli C. Donzetta Matters, MD [facility] [place] Office: [phone] Pager: [phone]

## 2020-04-29 NOTE — TOC Transition Note (Signed)
Transition of Care [facility]) - CM/SW Discharge Note   Patient Details  Name: [name] MRN: [id number] Date of Birth: Apr 26, 1951  [facility]) CM/SW Contact:  Vinie Sill, [place] Phone Number: 04/29/2020, 2:53 PM   Clinical Narrative:     Patient will DC to: [facility] DC Date: 04/29/2020 Family Notified: Gurney,spouse Transport By: Corey Harold   Per MD patient is ready for discharge. RN, patient, and facility notified of DC. Discharge Summary sent to facility. RN given number for [phone]. Ambulance transport requested for patient.   Clinical Social Worker signing off.  Thurmond Butts, MSW, LCSWA Clinical Social Worker    Final next level of care: Skilled Nursing Facility Barriers to Discharge: Barriers Resolved   Patient Goals and CMS Choice   CMS Medicare.gov Compare Post Acute Care list provided to:: Patient Represenative (must comment) Starla Link) Choice offered to / list presented to : Spouse  Discharge Placement              Patient [facility] bed at:  [facility]) Patient to be transferred to facility by: [place] Name of family member notified: Gurney,spouse Patient and family notified of of transfer: 04/29/20  Discharge Plan and Services In-house Referral: Clinical Social Work                                   Social Determinants of Health ([place]) Interventions     Readmission Risk Interventions No flowsheet data found.

## 2020-04-29 NOTE — Care Management Important Message (Signed)
Important Message  Patient Details  Name: Tonya Hughes MRN: [id number] Date of Birth: [birth year]   Medicare Important Message Given:  Yes     Shelda Altes 04/29/2020, 9:57 AM

## 2020-04-29 NOTE — Progress Notes (Signed)
Physical Therapy Treatment Patient Details Name: Tonya Hughes MRN: [id number] DOB: 02-Apr-1951 Today's Date: 04/29/2020    History of Present Illness 69 yo female with aortoiliac occulsive disease admitted for aortobifem BPG with significant blood loss operatively remained intubated post op. Ischemic colitis post op. Extubated 6/20. PMHx: CAD, PVD, systolic heart failure, HTN, DM    PT Comments    Patient progressing well towards PT goals. Improved ambulation distance with Min A and use of RW for support/safety. Continues to be limited by fatigue, pain and weakness. Noted to have new swelling/redness and warmth present on distal LLE on shin as well as a pressure sore on left heel. Floated heels and removed tight socks. Deferred sitting in chair due to it being uncomfortable. Will follow.    Follow Up Recommendations  SNF     Equipment Recommendations  3in1 (PT)    Recommendations for Other Services       Precautions / Restrictions Precautions Precautions: Fall Precaution Comments: pressure wound on left heel Restrictions Weight Bearing Restrictions: No    Mobility  Bed Mobility Overal bed mobility: Needs Assistance Bed Mobility: Supine to Sit;Sit to Supine     Supine to sit: HOB elevated;Min assist Sit to supine: Min assist;HOB elevated   General bed mobility comments: Assist with trunk to get to EOB.  Transfers Overall transfer level: Needs assistance Equipment used: Rolling walker (2 wheeled) Transfers: Sit to/from Stand Sit to Stand: Min assist;Mod assist         General transfer comment: Min-Mod A depending on the surfare height. Stood from [employer], from toilet x1. Declined transfer to chair due to it being uncomfortable.  Ambulation/Gait Ambulation/Gait assistance: Min assist Gait Distance (Feet): 28 Feet Assistive device: Rolling walker (2 wheeled) Gait Pattern/deviations: Step-through pattern;Decreased stride length;Trunk flexed;Steppage Gait velocity:  decreased   General Gait Details: Slow, unsteady gait with decreased foot clearance on LLE, almost like a steppage gait pattern. Cues for posture and positioning with RW. Limited due to fatigue/pain.   Stairs             Wheelchair Mobility    Modified Rankin (Stroke Patients Only)       Balance Overall balance assessment: Needs assistance Sitting-balance support: Feet supported;No upper extremity supported Sitting balance-Leahy Scale: Fair Sitting balance - Comments: total A to switch out socks sitting EOB   Standing balance support: During functional activity Standing balance-Leahy Scale: Poor Standing balance comment: bil UE support on RW; total A for pericare                            Cognition Arousal/Alertness: Awake/alert Behavior During Therapy: WFL for tasks assessed/performed Overall Cognitive Status: Impaired/Different from baseline                         Following Commands: Follows one step commands with increased time Safety/Judgement: Decreased awareness of safety;Decreased awareness of deficits Awareness: Emergent Problem Solving: Slow processing;Requires verbal cues General Comments: More pleasant during today's session. Self limiting due to pain at times.      Exercises General Exercises - Lower Extremity Ankle Circles/Pumps: AROM;Both;10 reps;Supine    General Comments General comments (skin integrity, edema, etc.): Pt with new redness on left lower shin on anterior surface,  swelling, warmth and tenderness to touch present. Also with pressure sore on heel; floated heels in bed post session and removed tight socks.      Pertinent  Vitals/Pain Pain Assessment: Faces Faces Pain Scale: Hurts even more Pain Location: LLE, heel Pain Descriptors / Indicators: Sore;Aching;Discomfort;Guarding Pain Intervention(s): Monitored during session;Repositioned;Limited activity within patient's tolerance    Home Living                       Prior Function            PT Goals (current goals can now be found in the care plan section) Progress towards PT goals: Progressing toward goals    Frequency    Min 3X/week      PT Plan Discharge plan needs to be updated    Co-evaluation              AM-PAC PT "6 Clicks" Mobility   Outcome Measure  Help needed turning from your back to your side while in a flat bed without using bedrails?: A Little Help needed moving from lying on your back to sitting on the side of a flat bed without using bedrails?: A Little Help needed moving to and from a bed to a chair (including a wheelchair)?: A Lot Help needed standing up from a chair using your arms (e.g., wheelchair or bedside chair)?: A Lot Help needed to walk in hospital room?: A Little Help needed climbing 3-5 steps with a railing? : Total 6 Click Score: 14    End of Session Equipment Utilized During Treatment: Gait belt Activity Tolerance: Patient limited by pain Patient left: in bed;with call bell/phone within reach;with bed alarm set Nurse Communication: Mobility status PT Visit Diagnosis: Other abnormalities of gait and mobility (R26.89);Difficulty in walking, not elsewhere classified (R26.2);Muscle weakness (generalized) (M62.81);Other symptoms and signs involving the nervous system (R29.898);Pain Pain - Right/Left: Left Pain - part of body: Leg     Time: 0100-7121 PT Time Calculation (min) (ACUTE ONLY): 21 min  Charges:  $Therapeutic Activity: 8-22 mins                     Marisa Severin, PT, DPT Acute Rehabilitation Services Pager [phone] Office [phone]       Marguarite Arbour A Sabra Heck 04/29/2020, 12:28 PM

## 2020-04-29 NOTE — Discharge Summary (Addendum)
Aortic Surgery Discharge Summary    Tonya Hughes Oct 09, 1951 69 y.o. female  [id number]  Admission Date: 04/18/2020  Discharge Date: 04/29/2020  Physician: Thomes Lolling*  Admission Diagnosis: Peripheral arterial occlusive disease ([place]) [I77.9] Shock ([place]) [R57.9]   HPI:   This is a 69 y.o. female with bilateral lower extremity rest pain has been considered to be multifactorial but she has significant aortoiliac occlusive disease with heavy calcification.  She has undergone angiography did not really have endovascular options we have considered axillobifemoral bypass versus aortobifemoral bypass she is undergoing cardiac clearance we will proceed with aortobifemoral bypass.  Hospital Course:  The patient was admitted to the hospital and taken to the operating room on 04/18/2020 and underwent: 1.  Lysis of abdominal adhesions greater than 30 minutes 2.  Aortic endarterectomy 3.  Right common femoral endarterectomy 4.  Aortobifemoral bypass with 14 x 7 mm Hemashield 5.  Ligation of left renal vein    Findings: Right common femoral artery was heavily calcified.  After endarterectomy we had good backbleeding from the SFA and profunda femoris arteries.  After bypass on the right there were good signals in both the profunda and the SFA.  The left common femoral artery had a high bifurcation under the inguinal ligament.  The SFA itself was heavily calcified.  We bypass to the common femoral artery with a toe on the profunda and a completion there was very good flow in the profunda that was pulsatile weaker signal in the SFA.  The left renal vein was ligated for additional exposure to the bilateral renal arteries.  The only soft area for clamping was right at the renal arteries.  Aortic endarterectomy was performed for a place to sew.  Unfortunately this did cause a tear on the back wall that was repaired with pledgeted sutures.  We also required distal aortic endarterectomy to  oversew our aorta.  There was minimal backbleeding from the IMA but it was a heavily diseased artery therefore it was ligated and not reimplanted.  Patient remained intubated given underlying cardiopulmonary disease requiring ongoing resuscitation.  The pt tolerated the procedure well and was transported to the ICU in good condition and remained intubated.  By POD 1, pt having BM all night with some small specks of blood present and flexiseal placed.  She was requiring pressor support.  She has had watery stools overnight fecal occult blood is positive however not grossly bloody.  Abdomen is soft white count improved from surgery today.  She remains on Levophed and intubated.  Must have high suspicion for colonic ischemia with ligation of inferior mesenteric artery.  We will start IV antibiotics and recheck labs today. feet are warm and well-perfused she has a good posterior tibial signal on the right and on the l back anterior eft side dorsalis pedis urine output has increased today.  CCM managing ventilator and critical care and their care is much appreciated.  Later that evening, pt spiked temp to 103.4,she continued to require pressors.  Her abdomen was soft with good UOP but there was ongoing concern for colonic ischemia.  GI was consulted.    Per GI:   Flexible sigmoidoscopy was well-tolerated.  It showed severe ischemic changes beginning in the distal rectum.  Please see dictated report with accompanying photographs for more details.  At this time, the tissue that I was able to observe did not look frankly gangrenous.  However, the the extent and severity of more proximal involvement cannot be ascertained from  tonight's limited exam.  Recommendations:  1.  I will leave the Flexi-Seal tube out, so that the balloon does not contribute to pressure necrosis of the compromised rectal mucosa.  2.  Management was discussed with Dr. Donzetta Matters.  Since the tissue I was able to see on tonight's exam does  not look frankly gangrenous, there is no definite mandate for operative intervention at this time.  Given the patient's poor underlying medical status, I agree with Dr. Claretha Cooper current plan for close observation rather than immediate surgery.  If the patient appears to be getting worse over time, surgery may become inevitable.  3.  If needed, a CT scan could provide further information about the extent, and to some degree the severity, of more proximal ischemic involvement.  4.  I have discussed the findings and plans with the patient's husband by telephone.  He was very appreciative for our efforts, and is happy to be called at any time.  5.  Per discussion with Dr. Donzetta Matters, there probably is no further role for our involvement in this patient's care, so we will sign off at this time, but will remain on standby in case we can be of further help.  POD 2/1, pt remained intubated but following commands.   Elected not to operate the previous night given patient's overall stability with soft abdomen and she continues to make urine labs overall improving.  A CT of abdomen/pelvis ordered with contrast.   CT scan reviewed demonstrates thickening of of the rectum and sigmoid colon.  Aortobifemoral bypass graft grossly patent.  I can readily palpate right common femoral pulse difficulty on the left given her size and high anastomosis but certainly all looks patent by CT scan although there are still very weak signals in the left foot possibly due to Levophed infusion.  I will consult general surgery to have them evaluate patient.  Plan communicated to critical care team Dr. Elsworth Soho as well as patient's husband.   Per General surgery: Ischemic colitis of the left/sigmoid colon and proximal rectum - likely chronic disease exacerbated by recent surgical bypass with ligation of the IMA. No signs of perforation or worsening ischemia - patient seems to be clinically improving Would be cautious in advance diet when  patient extubated  POD 3/2, pressor support decreasing.  Creatinine peaked at 2 but having adequate uop.  Leukocytosis continues to improve  IV abx continuing for colonic ischemia.  Acute blood loss anemia-received transfusion.  Pt extubated.    POD 4/3,  Patient is quite upset about not being able to eat.  Overall her abdominal pain is much improved she is extubated off of Levophed.  General surgery input as well as GI evaluation much appreciated.  She is okay for transfer to the floor today.  After discussion with family and further discussion with patient have determined she is actually not having delirium and is just angry.  She has been screaming at staff all day, per her sister this is fairly normal for her.  I have asked sister to come in.  Given this and stability off vent/pressors, she is stable for transfer to progressive.  POD 5/4, pt less aggressive.  She is tolerating ice chips.  Foley removed.   Her NGT removed and she is started on clear liquid diet.  OOB as tolerated.  Pt had BM.    POD 6/5, general surgery advanced diet.  Continue IV abx until leukocytosis resolves.   POD 7/6, c/o leg swelling.  Has hx of  CHF and was placed on lasix in the past.  Tolerating diet. IVF d/c'd.    POD 8/7, right IJ line discontinued.  WBC trending down.    POD 9/8, float heels due to early pressure sore on left heel.  Mobilize.    POD 10/9, pt with mild serosanguineous drainage from left groin.  Pt afebrile and leukocytosis resolved.   POD 11/10, pt sitting on side of bed and looks good this morning.  Hopeful for transfer to SNF as she is medically ready.   CBC    Component Value Date/Time   WBC 9.0 04/28/2020 0158   RBC 2.92 (L) 04/28/2020 0158   HGB 8.6 (L) 04/28/2020 0158   HCT 27.7 (L) 04/28/2020 0158   PLT 340 04/28/2020 0158   MCV 94.9 04/28/2020 0158   MCH 29.5 04/28/2020 0158   MCHC 31.0 04/28/2020 0158   RDW 16.4 (H) 04/28/2020 0158   LYMPHSABS 0.8 04/26/2020 0830   MONOABS 0.5  04/26/2020 0830   EOSABS 0.3 04/26/2020 0830   BASOSABS 0.0 04/26/2020 0830    BMET    Component Value Date/Time   NA 140 04/26/2020 0830   K 3.5 04/26/2020 0830   CL 110 04/26/2020 0830   CO2 22 04/26/2020 0830   GLUCOSE 131 (H) 04/26/2020 0830   BUN 17 04/26/2020 0830   CREATININE 1.15 (H) 04/26/2020 0830   CALCIUM 7.4 (L) 04/26/2020 0830   GFRNONAA 49 (L) 04/26/2020 0830   GFRAA 57 (L) 04/26/2020 0830     Discharge Instructions    Discharge patient   Complete by: As directed    Please dc to SNF once approved.  Please make sure pt has her inhalers from here to go to SNF with her.  Thanks.   Discharge disposition: 03-Skilled Nursing Facility   Discharge patient date: 04/29/2020      Discharge Diagnosis:  Peripheral arterial occlusive disease ([place]) [I77.9] Shock ([place]) [R57.9]  Secondary Diagnosis: Patient Active Problem List   Diagnosis Date Noted  . Hemorrhagic shock ([place])   . Peripheral arterial occlusive disease ([place]) 04/18/2020  . Shock ([place]) 04/18/2020  . Hypertension   . Hypercholesteremia   . GERD (gastroesophageal reflux disease)   . Depressive disorder   . Congestive heart failure (CHF) ([place])   . Back pain   . Acute deep vein thrombosis (DVT) of proximal vein of right lower extremity ([place]) 08/27/2017  . Closed fracture of lumbar vertebra ([place]) 05/31/2017  . S/P lumbar fusion 04/21/2017  . Lumbar radiculopathy 02/25/2017   Past Medical History:  Diagnosis Date  . Arthritis    HANDS  . Back pain   . Congestive heart failure (CHF) ([place])   . Depressive disorder   . Diabetes mellitus without complication ([place])    new, A1c 6.9% 04/2020  . DVT (deep venous thrombosis) ([place])    Right LE s/p back surgery stop date lovenox 09/05/17  . Dyspnea   . Gastritis   . GERD (gastroesophageal reflux disease)   . Hypercholesteremia   . Hypertension   . Pneumonia      Allergies as of 04/29/2020      Reactions   Zanaflex [tizanidine Hcl] Hives, Swelling    Penicillins Itching, Rash   Did it involve swelling of the face/tongue/throat, SOB, or low BP? No Did it involve sudden or severe rash/hives, skin peeling, or any reaction on the inside of your mouth or nose? Yes Did you need to seek medical attention at a hospital or doctor's office? Yes When  did it last happen?40 + years If all above answers are "NO", may proceed with cephalosporin use.      Medication List    TAKE these medications   albuterol (2.5 MG/3ML) 0.083% nebulizer solution Commonly known as: PROVENTIL Take 3 mLs (2.5 mg total) by nebulization 2 (two) times daily.   arformoterol 15 MCG/2ML Nebu Commonly known as: BROVANA Take 2 mLs (15 mcg total) by nebulization 2 (two) times daily.   aspirin EC 81 MG tablet Take 81 mg by mouth daily.   atorvastatin 40 MG tablet Commonly known as: LIPITOR Take 1 tablet (40 mg total) by mouth daily.   carvedilol 3.125 MG tablet Commonly known as: COREG TAKE ONE TABLET BY MOUTH TWICE DAILY   fexofenadine 180 MG tablet Commonly known as: ALLEGRA Take 180 mg by mouth daily.   furosemide 40 MG tablet Commonly known as: LASIX Take 40 mg by mouth daily.   HYDROcodone-acetaminophen 5-325 MG tablet Commonly known as: NORCO/VICODIN Take 1 tablet by mouth every 6 (six) hours as needed for moderate pain.   lisinopril 5 MG tablet Commonly known as: ZESTRIL Take 5 mg by mouth daily.   PARoxetine 20 MG tablet Commonly known as: PAXIL Take 20 mg by mouth daily.   potassium chloride SA 20 MEQ tablet Commonly known as: KLOR-CON TAKE ONE (1) TABLET EACH DAY What changed: See the new instructions.       Instructions:  [facility] Discharge Instructions   Open Aortic Surgery  Please refer to the following instructions for your post-procedure care. Your surgeon or Physician Assistant will discuss any changes with you.  Activity  Avoid lifting more than eight pounds (a gallon of milk)  until after your first post-operative visit. You are encouraged to walk as much as you can. You can slowly return to normal activities but must avoid strenuous activity and heavy lifting until your doctor tells you it's okay. Heavy lifting can hurt the incision and cause a hernia. Avoid activities such as vacuuming or swinging a golf club. It is normal to feel tired for several weeks after your surgery. Do not drive until your doctor gives the okay and you are no longer taking prescription pain medications. It is also normal to have difficulty with sleep habits, eating and bowl movements after surgery. These will go away with time.  Bathing/Showering  Shower daily after you go home. Do not soak in a bathtub, hot tub, or swim until the incision heals.  Incision Care  Shower every day. Clean your incision with mild soap and water. Pat the area dry with a clean towel. You do not need a bandage unless otherwise instructed. Do not apply any ointments or creams to your incision. You may have skin glue on your incision. Do not peel it off. It will come off on its own in about one week. If you have staples or sutures along your incision, they will be removed at your post op appointment.  If you have groin incisions, wash the groin wounds with soap and water daily and pat dry. (No tub bath-only shower)  Then put a dry gauze or washcloth in the groin to keep this area dry to help prevent wound infection.  Do this daily and as needed.  Do not use Vaseline or neosporin on your incisions.  Only use soap and water on your incisions and then protect and keep dry.  PLEASE FLOAT HEELS OFF BED.  Diet  Resume your normal diet. There  are no special food restriction following this procedure. A low fat/low cholesterol diet is recommended for all patients with vascular disease. After your aortic surgery, it's normal to feel full faster than usual and to not feel as hungry as you normally would. You will probably lose  weight initially following your surgery. It's best to eat small, frequent meals over the course of the day. Call the office if you find that you are unable to eat even small meals.   In order to heal from your surgery, it is CRITICAL to get adequate nutrition. Your body requires vitamins, minerals, and protein. Vegetables are the best source of vitamins and minerals.  If you have pain, you may take over-the-counter pain reliever such as acetaminophen (Tylenol). If you were prescribed a stronger pain medication, please be aware these medication can cause nausea and constipation. Prevent nausea by taking the medication with a snack or meal. Avoid constipation by drinking plenty of fluids and eating foods with a high amount of fiber, such as fruits, vegetables and grains. Take 180m of the over-the-counter stool softener Colace twice a day as needed to help with constipation. A laxative, such as Milk of Magnesia, may be recommended for you at this time. Do not take a laxative unless your surgeon or P.A. tells you it's OK.  Do not take Tylenol if you are taking stronger pain medications (such as Percocet).  Follow Up  Our office will schedule a follow up appointment 2-3 weeks after discharge.  Please call [place] for any of the following conditions    .     Severe or worsening pain in your legs or feet or in your abdomen back or chest. . Increased pain, redness drainage (pus) from your incision site. . Increased abdominal pain, bloating, nausea, vomiting, or persistent diarrhea. . Fever of 101 degrees or higher. . Swelling in your leg (s). .  Reduce your risk of vascular disease  . Stop smoking. If you would like help, call [facility] at 1-800-QUIT-NOW (585-187-8328 or [place] 3602-030-1808 . Manage your cholesterol . Maintain a desired weight . Control your diabetes . Keep your blood pressure down .  If you have any questions please call the office at  [phone]   Prescriptions given: 1.  Vicodin 5/325 one q6h prn pain #30 No Refill 2.  Albuterol nebulizer (no print) 3. Brovana (no print)  **pt should receive inhalers that she has been using in hospital to take to SNF  Disposition: SNF  Patient's condition: is Good  Follow up: 1. Dr. CDonzetta Mattersin 2 weeks   SLeontine Locket PA-C [facility] [phone]/28/2021  10:31 AM   - For VQI Registry use -  Post-op:  Time to Extubation: []  In OR, [ ]  < 12 hrs, [ ]  12-24 hrs, [ x] >=24 hrs Vasopressors Req. Post-op: Yes ICU Stay: 4 day in ICU Transfusion: Yes   If yes, 5 PRBC's and 2 FFP units given MI: No, [ ]  Troponin only, [ ]  EKG or Clinical New Arrhythmia: No  Complications: CHF: No Resp failure: No, [ ]  Pneumonia, [ ]  Ventilator Chg in renal function: Yes, [ ]  Inc. Cr > 0.5, [ ]  Temp. Dialysis, [ ]  Permanent dialysis Leg ischemia: No, no Surgery needed, [ ]  Yes, Surgery needed, [ ]  Amputation Bowel ischemia: Yes, [ ]  Medical Rx, [ ]  Surgical Rx Wound complication: No, [ ]  Superficial separation/infection, [ ]  Return to OR Return to OR: No  Return to OR  for bleeding: No Stroke: No, [ ]  Minor, [ ]  Major  Discharge medications: Statin use:  Yes  ASA use:  Yes   Plavix use:  No  Beta blocker use:  Yes  ACEI use:  Yes ARB use:  No CCB use:  No Coumadin use:  No

## 2020-04-30 DIAGNOSIS — S32009A Unspecified fracture of unspecified lumbar vertebra, initial encounter for closed fracture: Secondary | ICD-10-CM | POA: Diagnosis not present

## 2020-04-30 DIAGNOSIS — M545 Low back pain: Secondary | ICD-10-CM | POA: Diagnosis not present

## 2020-04-30 DIAGNOSIS — Z299 Encounter for prophylactic measures, unspecified: Secondary | ICD-10-CM | POA: Diagnosis not present

## 2020-04-30 DIAGNOSIS — I82409 Acute embolism and thrombosis of unspecified deep veins of unspecified lower extremity: Secondary | ICD-10-CM | POA: Diagnosis not present

## 2020-04-30 DIAGNOSIS — I509 Heart failure, unspecified: Secondary | ICD-10-CM | POA: Diagnosis not present

## 2020-05-01 ENCOUNTER — Encounter: Payer: Self-pay | Admitting: *Deleted

## 2020-05-01 NOTE — Telephone Encounter (Signed)
Laurine Blazer, LPN  3/38/3291 9:16 PM EDT Back to Top    Notified via normal results letter.    Laurine Blazer, LPN  04/07/44 99:77 AM EDT     Left message to return call.   Copy to pcp.

## 2020-05-01 NOTE — Telephone Encounter (Signed)
STRESS TEST -   Arnoldo Lenis, MD  04/23/2020 3:04 PM EDT     Stress test looks good, no signs of any blockages in the arteries

## 2020-05-06 DIAGNOSIS — A0472 Enterocolitis due to Clostridium difficile, not specified as recurrent: Secondary | ICD-10-CM | POA: Diagnosis not present

## 2020-05-07 DIAGNOSIS — Z299 Encounter for prophylactic measures, unspecified: Secondary | ICD-10-CM | POA: Diagnosis not present

## 2020-05-07 DIAGNOSIS — I82409 Acute embolism and thrombosis of unspecified deep veins of unspecified lower extremity: Secondary | ICD-10-CM | POA: Diagnosis not present

## 2020-05-07 DIAGNOSIS — I509 Heart failure, unspecified: Secondary | ICD-10-CM | POA: Diagnosis not present

## 2020-05-07 DIAGNOSIS — I10 Essential (primary) hypertension: Secondary | ICD-10-CM | POA: Diagnosis not present

## 2020-05-07 DIAGNOSIS — R197 Diarrhea, unspecified: Secondary | ICD-10-CM | POA: Diagnosis not present

## 2020-05-16 DIAGNOSIS — I509 Heart failure, unspecified: Secondary | ICD-10-CM | POA: Diagnosis not present

## 2020-05-16 DIAGNOSIS — Z299 Encounter for prophylactic measures, unspecified: Secondary | ICD-10-CM | POA: Diagnosis not present

## 2020-05-16 DIAGNOSIS — I10 Essential (primary) hypertension: Secondary | ICD-10-CM | POA: Diagnosis not present

## 2020-05-17 ENCOUNTER — Encounter: Payer: Self-pay | Admitting: Vascular Surgery

## 2020-05-17 ENCOUNTER — Inpatient Hospital Stay (HOSPITAL_COMMUNITY)
Admission: EM | Admit: 2020-05-17 | Discharge: 2020-05-30 | DRG: 357 | Disposition: A | Discharge status: Home / Self Care | Payer: Medicare Other | Attending: Internal Medicine | Admitting: Internal Medicine

## 2020-05-17 ENCOUNTER — Emergency Department (HOSPITAL_COMMUNITY): Payer: Medicare Other

## 2020-05-17 ENCOUNTER — Ambulatory Visit (INDEPENDENT_AMBULATORY_CARE_PROVIDER_SITE_OTHER): Payer: Self-pay | Admitting: Vascular Surgery

## 2020-05-17 ENCOUNTER — Other Ambulatory Visit: Payer: Self-pay

## 2020-05-17 ENCOUNTER — Encounter (HOSPITAL_COMMUNITY): Payer: Self-pay | Admitting: Emergency Medicine

## 2020-05-17 VITALS — BP 129/78 | HR 77 | Temp 97.9°F | Resp 20 | Ht 63.0 in | Wt 185.0 lb

## 2020-05-17 DIAGNOSIS — K5790 Diverticulosis of intestine, part unspecified, without perforation or abscess without bleeding: Secondary | ICD-10-CM | POA: Diagnosis not present

## 2020-05-17 DIAGNOSIS — A0472 Enterocolitis due to Clostridium difficile, not specified as recurrent: Principal | ICD-10-CM | POA: Diagnosis present

## 2020-05-17 DIAGNOSIS — E46 Unspecified protein-calorie malnutrition: Secondary | ICD-10-CM | POA: Diagnosis present

## 2020-05-17 DIAGNOSIS — R1084 Generalized abdominal pain: Secondary | ICD-10-CM

## 2020-05-17 DIAGNOSIS — I700 Atherosclerosis of aorta: Secondary | ICD-10-CM | POA: Diagnosis not present

## 2020-05-17 DIAGNOSIS — I9789 Other postprocedural complications and disorders of the circulatory system, not elsewhere classified: Secondary | ICD-10-CM | POA: Diagnosis not present

## 2020-05-17 DIAGNOSIS — N182 Chronic kidney disease, stage 2 (mild): Secondary | ICD-10-CM | POA: Diagnosis present

## 2020-05-17 DIAGNOSIS — K529 Noninfective gastroenteritis and colitis, unspecified: Secondary | ICD-10-CM | POA: Diagnosis not present

## 2020-05-17 DIAGNOSIS — R109 Unspecified abdominal pain: Secondary | ICD-10-CM | POA: Diagnosis present

## 2020-05-17 DIAGNOSIS — E1151 Type 2 diabetes mellitus with diabetic peripheral angiopathy without gangrene: Secondary | ICD-10-CM | POA: Diagnosis not present

## 2020-05-17 DIAGNOSIS — N179 Acute kidney failure, unspecified: Secondary | ICD-10-CM | POA: Diagnosis not present

## 2020-05-17 DIAGNOSIS — Y848 Other medical procedures as the cause of abnormal reaction of the patient, or of later complication, without mention of misadventure at the time of the procedure: Secondary | ICD-10-CM | POA: Diagnosis present

## 2020-05-17 DIAGNOSIS — N1831 Chronic kidney disease, stage 3a: Secondary | ICD-10-CM | POA: Diagnosis present

## 2020-05-17 DIAGNOSIS — K6289 Other specified diseases of anus and rectum: Secondary | ICD-10-CM | POA: Diagnosis not present

## 2020-05-17 DIAGNOSIS — K5150 Left sided colitis without complications: Secondary | ICD-10-CM | POA: Diagnosis not present

## 2020-05-17 DIAGNOSIS — E871 Hypo-osmolality and hyponatremia: Secondary | ICD-10-CM | POA: Diagnosis present

## 2020-05-17 DIAGNOSIS — I130 Hypertensive heart and chronic kidney disease with heart failure and stage 1 through stage 4 chronic kidney disease, or unspecified chronic kidney disease: Secondary | ICD-10-CM | POA: Diagnosis present

## 2020-05-17 DIAGNOSIS — E876 Hypokalemia: Secondary | ICD-10-CM | POA: Diagnosis present

## 2020-05-17 DIAGNOSIS — E7800 Pure hypercholesterolemia, unspecified: Secondary | ICD-10-CM | POA: Diagnosis not present

## 2020-05-17 DIAGNOSIS — L89626 Pressure-induced deep tissue damage of left heel: Secondary | ICD-10-CM | POA: Diagnosis not present

## 2020-05-17 DIAGNOSIS — I779 Disorder of arteries and arterioles, unspecified: Secondary | ICD-10-CM | POA: Diagnosis not present

## 2020-05-17 DIAGNOSIS — R935 Abnormal findings on diagnostic imaging of other abdominal regions, including retroperitoneum: Secondary | ICD-10-CM | POA: Diagnosis not present

## 2020-05-17 DIAGNOSIS — Z8719 Personal history of other diseases of the digestive system: Secondary | ICD-10-CM

## 2020-05-17 DIAGNOSIS — R627 Adult failure to thrive: Secondary | ICD-10-CM

## 2020-05-17 DIAGNOSIS — Z9049 Acquired absence of other specified parts of digestive tract: Secondary | ICD-10-CM

## 2020-05-17 DIAGNOSIS — J449 Chronic obstructive pulmonary disease, unspecified: Secondary | ICD-10-CM | POA: Diagnosis present

## 2020-05-17 DIAGNOSIS — E1122 Type 2 diabetes mellitus with diabetic chronic kidney disease: Secondary | ICD-10-CM | POA: Diagnosis present

## 2020-05-17 DIAGNOSIS — T8131XA Disruption of external operation (surgical) wound, not elsewhere classified, initial encounter: Secondary | ICD-10-CM | POA: Diagnosis present

## 2020-05-17 DIAGNOSIS — D638 Anemia in other chronic diseases classified elsewhere: Secondary | ICD-10-CM | POA: Diagnosis present

## 2020-05-17 DIAGNOSIS — Z7401 Bed confinement status: Secondary | ICD-10-CM | POA: Diagnosis not present

## 2020-05-17 DIAGNOSIS — K5190 Ulcerative colitis, unspecified, without complications: Secondary | ICD-10-CM | POA: Diagnosis not present

## 2020-05-17 DIAGNOSIS — D62 Acute posthemorrhagic anemia: Secondary | ICD-10-CM | POA: Diagnosis not present

## 2020-05-17 DIAGNOSIS — K219 Gastro-esophageal reflux disease without esophagitis: Secondary | ICD-10-CM | POA: Diagnosis not present

## 2020-05-17 DIAGNOSIS — Z20822 Contact with and (suspected) exposure to covid-19: Secondary | ICD-10-CM | POA: Diagnosis present

## 2020-05-17 DIAGNOSIS — K625 Hemorrhage of anus and rectum: Secondary | ICD-10-CM | POA: Diagnosis not present

## 2020-05-17 DIAGNOSIS — Z880 Allergy status to penicillin: Secondary | ICD-10-CM

## 2020-05-17 DIAGNOSIS — K314 Gastric diverticulum: Secondary | ICD-10-CM | POA: Diagnosis not present

## 2020-05-17 DIAGNOSIS — I10 Essential (primary) hypertension: Secondary | ICD-10-CM | POA: Diagnosis present

## 2020-05-17 DIAGNOSIS — R159 Full incontinence of feces: Secondary | ICD-10-CM | POA: Diagnosis not present

## 2020-05-17 DIAGNOSIS — K55059 Acute (reversible) ischemia of intestine, part and extent unspecified: Secondary | ICD-10-CM | POA: Diagnosis not present

## 2020-05-17 DIAGNOSIS — N1330 Unspecified hydronephrosis: Secondary | ICD-10-CM | POA: Diagnosis not present

## 2020-05-17 DIAGNOSIS — Z87891 Personal history of nicotine dependence: Secondary | ICD-10-CM

## 2020-05-17 DIAGNOSIS — T8189XA Other complications of procedures, not elsewhere classified, initial encounter: Secondary | ICD-10-CM | POA: Diagnosis not present

## 2020-05-17 DIAGNOSIS — E785 Hyperlipidemia, unspecified: Secondary | ICD-10-CM | POA: Diagnosis present

## 2020-05-17 DIAGNOSIS — F32A Depression, unspecified: Secondary | ICD-10-CM | POA: Diagnosis present

## 2020-05-17 DIAGNOSIS — K551 Chronic vascular disorders of intestine: Secondary | ICD-10-CM | POA: Diagnosis not present

## 2020-05-17 DIAGNOSIS — K56600 Partial intestinal obstruction, unspecified as to cause: Secondary | ICD-10-CM | POA: Diagnosis not present

## 2020-05-17 DIAGNOSIS — F329 Major depressive disorder, single episode, unspecified: Secondary | ICD-10-CM | POA: Diagnosis present

## 2020-05-17 DIAGNOSIS — Z6828 Body mass index (BMI) 28.0-28.9, adult: Secondary | ICD-10-CM

## 2020-05-17 DIAGNOSIS — I110 Hypertensive heart disease with heart failure: Secondary | ICD-10-CM | POA: Diagnosis not present

## 2020-05-17 DIAGNOSIS — I5032 Chronic diastolic (congestive) heart failure: Secondary | ICD-10-CM | POA: Diagnosis present

## 2020-05-17 DIAGNOSIS — Z79899 Other long term (current) drug therapy: Secondary | ICD-10-CM

## 2020-05-17 DIAGNOSIS — R197 Diarrhea, unspecified: Secondary | ICD-10-CM | POA: Diagnosis not present

## 2020-05-17 DIAGNOSIS — K649 Unspecified hemorrhoids: Secondary | ICD-10-CM | POA: Diagnosis present

## 2020-05-17 DIAGNOSIS — I509 Heart failure, unspecified: Secondary | ICD-10-CM

## 2020-05-17 DIAGNOSIS — I739 Peripheral vascular disease, unspecified: Secondary | ICD-10-CM

## 2020-05-17 DIAGNOSIS — Z743 Need for continuous supervision: Secondary | ICD-10-CM | POA: Diagnosis not present

## 2020-05-17 DIAGNOSIS — R6889 Other general symptoms and signs: Secondary | ICD-10-CM | POA: Diagnosis not present

## 2020-05-17 DIAGNOSIS — K56699 Other intestinal obstruction unspecified as to partial versus complete obstruction: Secondary | ICD-10-CM | POA: Diagnosis not present

## 2020-05-17 DIAGNOSIS — K633 Ulcer of intestine: Secondary | ICD-10-CM | POA: Diagnosis not present

## 2020-05-17 DIAGNOSIS — K559 Vascular disorder of intestine, unspecified: Secondary | ICD-10-CM | POA: Diagnosis not present

## 2020-05-17 DIAGNOSIS — E669 Obesity, unspecified: Secondary | ICD-10-CM | POA: Diagnosis present

## 2020-05-17 DIAGNOSIS — Z888 Allergy status to other drugs, medicaments and biological substances status: Secondary | ICD-10-CM

## 2020-05-17 DIAGNOSIS — K921 Melena: Secondary | ICD-10-CM | POA: Diagnosis not present

## 2020-05-17 DIAGNOSIS — K626 Ulcer of anus and rectum: Secondary | ICD-10-CM | POA: Diagnosis not present

## 2020-05-17 DIAGNOSIS — M2550 Pain in unspecified joint: Secondary | ICD-10-CM | POA: Diagnosis not present

## 2020-05-17 LAB — COMPREHENSIVE METABOLIC PANEL
ALT: 15 U/L (ref 0–44)
AST: 29 U/L (ref 15–41)
Albumin: 2.6 g/dL — ABNORMAL LOW (ref 3.5–5.0)
Alkaline Phosphatase: 66 U/L (ref 38–126)
Anion gap: 16 — ABNORMAL HIGH (ref 5–15)
BUN: 18 mg/dL (ref 8–23)
CO2: 26 mmol/L (ref 22–32)
Calcium: 8.4 mg/dL — ABNORMAL LOW (ref 8.9–10.3)
Chloride: 86 mmol/L — ABNORMAL LOW (ref 98–111)
Creatinine, Ser: 1.32 mg/dL — ABNORMAL HIGH (ref 0.44–1.00)
GFR calc Af Amer: 48 mL/min — ABNORMAL LOW (ref >60)
GFR calc non Af Amer: 42 mL/min — ABNORMAL LOW (ref >60)
Glucose, Bld: 123 mg/dL — ABNORMAL HIGH (ref 70–99)
Potassium: 3.1 mmol/L — ABNORMAL LOW (ref 3.5–5.1)
Sodium: 128 mmol/L — ABNORMAL LOW (ref 135–145)
Total Bilirubin: 0.9 mg/dL (ref 0.3–1.2)
Total Protein: 7.4 g/dL (ref 6.5–8.1)

## 2020-05-17 LAB — CBC
HCT: 33.8 % — ABNORMAL LOW (ref 36.0–46.0)
Hemoglobin: 10.8 g/dL — ABNORMAL LOW (ref 12.0–15.0)
MCH: 28 pg (ref 26.0–34.0)
MCHC: 32 g/dL (ref 30.0–36.0)
MCV: 87.6 fL (ref 80.0–100.0)
Platelets: 304 10*3/uL (ref 150–400)
RBC: 3.86 MIL/uL — ABNORMAL LOW (ref 3.87–5.11)
RDW: 16.4 % — ABNORMAL HIGH (ref 11.5–15.5)
WBC: 9.7 10*3/uL (ref 4.0–10.5)
nRBC: 0 % (ref 0.0–0.2)

## 2020-05-17 LAB — LIPASE, BLOOD: Lipase: 28 U/L (ref 11–51)

## 2020-05-17 LAB — CBG MONITORING, ED: Glucose-Capillary: 101 mg/dL — ABNORMAL HIGH (ref 70–99)

## 2020-05-17 LAB — C DIFFICILE QUICK SCREEN W PCR REFLEX
C Diff antigen: POSITIVE — AB
C Diff toxin: NEGATIVE

## 2020-05-17 LAB — CLOSTRIDIUM DIFFICILE BY PCR, REFLEXED: Toxigenic C. Difficile by PCR: POSITIVE — AB

## 2020-05-17 MED ORDER — LORATADINE 10 MG PO TABS
10.0000 mg | ORAL_TABLET | Freq: Every day | ORAL | Status: DC
  Administered 2020-05-18 – 2020-05-30 (×13): 10 mg via ORAL
  Filled 2020-05-17 (×13): qty 1

## 2020-05-17 MED ORDER — POTASSIUM CHLORIDE 10 MEQ/100ML IV SOLN
10.0000 meq | INTRAVENOUS | Status: AC
  Administered 2020-05-18 (×4): 10 meq via INTRAVENOUS
  Filled 2020-05-17 (×4): qty 100

## 2020-05-17 MED ORDER — ALBUTEROL SULFATE (2.5 MG/3ML) 0.083% IN NEBU
2.5000 mg | INHALATION_SOLUTION | Freq: Two times a day (BID) | RESPIRATORY_TRACT | Status: DC
  Filled 2020-05-17 (×3): qty 3

## 2020-05-17 MED ORDER — SODIUM CHLORIDE 0.9 % IV SOLN: INTRAVENOUS | Status: AC

## 2020-05-17 MED ORDER — CIPROFLOXACIN IN D5W 400 MG/200ML IV SOLN: 400.0000 mg | Freq: Two times a day (BID) | INTRAVENOUS | Status: DC

## 2020-05-17 MED ORDER — ARFORMOTEROL TARTRATE 15 MCG/2ML IN NEBU
15.0000 ug | INHALATION_SOLUTION | Freq: Two times a day (BID) | RESPIRATORY_TRACT | Status: DC
  Filled 2020-05-17 (×7): qty 2

## 2020-05-17 MED ORDER — ONDANSETRON HCL 4 MG/2ML IJ SOLN
4.0000 mg | Freq: Four times a day (QID) | INTRAMUSCULAR | Status: DC | PRN
  Administered 2020-05-22 – 2020-05-26 (×8): 4 mg via INTRAVENOUS
  Filled 2020-05-17 (×10): qty 2

## 2020-05-17 MED ORDER — ONDANSETRON HCL 4 MG/2ML IJ SOLN
4.0000 mg | Freq: Once | INTRAMUSCULAR | Status: AC
  Administered 2020-05-17: 4 mg via INTRAVENOUS
  Filled 2020-05-17: qty 2

## 2020-05-17 MED ORDER — ACETAMINOPHEN 325 MG PO TABS
650.0000 mg | ORAL_TABLET | Freq: Four times a day (QID) | ORAL | Status: DC | PRN
  Administered 2020-05-21: 650 mg via ORAL
  Filled 2020-05-17: qty 2

## 2020-05-17 MED ORDER — SODIUM CHLORIDE 0.9% FLUSH
3.0000 mL | Freq: Once | INTRAVENOUS | Status: AC
  Administered 2020-05-17: 3 mL via INTRAVENOUS

## 2020-05-17 MED ORDER — FENTANYL CITRATE (PF) 100 MCG/2ML IJ SOLN
25.0000 ug | Freq: Once | INTRAMUSCULAR | Status: AC
  Administered 2020-05-17: 25 ug via INTRAVENOUS
  Filled 2020-05-17: qty 2

## 2020-05-17 MED ORDER — SODIUM CHLORIDE 0.9 % IV SOLN
2.0000 g | INTRAVENOUS | Status: DC
  Administered 2020-05-18 – 2020-05-23 (×7): 2 g via INTRAVENOUS
  Filled 2020-05-17 (×4): qty 2
  Filled 2020-05-17: qty 20
  Filled 2020-05-17 (×2): qty 2

## 2020-05-17 MED ORDER — PAROXETINE HCL 20 MG PO TABS
20.0000 mg | ORAL_TABLET | Freq: Every day | ORAL | Status: DC
  Administered 2020-05-18 – 2020-05-30 (×13): 20 mg via ORAL
  Filled 2020-05-17 (×14): qty 1

## 2020-05-17 MED ORDER — ASPIRIN EC 81 MG PO TBEC
81.0000 mg | DELAYED_RELEASE_TABLET | Freq: Every day | ORAL | Status: DC
  Administered 2020-05-18: 81 mg via ORAL
  Filled 2020-05-17: qty 1

## 2020-05-17 MED ORDER — IOHEXOL 350 MG/ML SOLN
100.0000 mL | Freq: Once | INTRAVENOUS | Status: AC | PRN
  Administered 2020-05-17: 100 mL via INTRAVENOUS

## 2020-05-17 MED ORDER — VANCOMYCIN 50 MG/ML ORAL SOLUTION
125.0000 mg | Freq: Four times a day (QID) | ORAL | Status: DC
  Administered 2020-05-18 – 2020-05-24 (×11): 125 mg via ORAL
  Filled 2020-05-17 (×31): qty 2.5

## 2020-05-17 MED ORDER — METRONIDAZOLE IN NACL 5-0.79 MG/ML-% IV SOLN
500.0000 mg | Freq: Once | INTRAVENOUS | Status: AC
  Administered 2020-05-17: 500 mg via INTRAVENOUS
  Filled 2020-05-17: qty 100

## 2020-05-17 MED ORDER — LACTATED RINGERS IV BOLUS
1000.0000 mL | Freq: Once | INTRAVENOUS | Status: AC
  Administered 2020-05-17: 1000 mL via INTRAVENOUS

## 2020-05-17 MED ORDER — HEPARIN SODIUM (PORCINE) 5000 UNIT/ML IJ SOLN
5000.0000 [IU] | Freq: Three times a day (TID) | INTRAMUSCULAR | Status: DC
  Administered 2020-05-18 (×2): 5000 [IU] via SUBCUTANEOUS
  Filled 2020-05-17 (×2): qty 1

## 2020-05-17 MED ORDER — INSULIN ASPART 100 UNIT/ML ~~LOC~~ SOLN: 0.0000 [IU] | SUBCUTANEOUS | Status: DC

## 2020-05-17 MED ORDER — ATORVASTATIN CALCIUM 40 MG PO TABS
40.0000 mg | ORAL_TABLET | Freq: Every day | ORAL | Status: DC
  Administered 2020-05-18 – 2020-05-29 (×13): 40 mg via ORAL
  Filled 2020-05-17 (×13): qty 1

## 2020-05-17 MED ORDER — FENTANYL CITRATE (PF) 100 MCG/2ML IJ SOLN
50.0000 ug | INTRAMUSCULAR | Status: DC | PRN
  Administered 2020-05-29: 100 ug via INTRAVENOUS
  Filled 2020-05-17: qty 2

## 2020-05-17 NOTE — ED Triage Notes (Signed)
Patient arrives to ED with complaints of generalized abdominal pain, decreased PO intake, diarrhea, and dehydration or the past 10 days. Patient states she is now having a hard time walking and is extremely weak.

## 2020-05-17 NOTE — Consult Note (Signed)
Patient known to our service.  Dr. Gwenlyn Saran evaluated this patient earlier today and thought that she probably had ischemic colitis and failure to thrive.  Her CT scan was reviewed and is consistent with this.  I believe she warrants admission for antibiotic coverage and bowel rest for her ischemic colitis.  We will follow as a consult.  There does not appear to be any involvement of her aortic graft.  She may eventually need further evaluation by the GI service or potentially general surgery if she deteriorates.  Ruta Hinds, MD [facility] [place] Office: [phone]

## 2020-05-17 NOTE — Progress Notes (Signed)
Patient ID: Tonya Hughes, female   DOB: 12/29/1950, 69 y.o.   MRN: [id number]  Reason for Consult: Post-op Follow-up   Referred by Monico Blitz, MD  Subjective:     HPI:  Tonya Hughes is a 69 y.o. female status post aortobifemoral bypass for bilateral lower extremity ischemia with rest pain.  Postoperative course was complicated by sigmoid ischemia she avoided repeat surgery ultimately was able to tolerate regular diet and was discharged to a nursing facility.  She now returns after being home for 1 day.  States that she has significant abdominal pain with diarrhea.  She does have an appetite but is unable to eat anything mostly because she loses her appetite after initiating p.o. intake.  She denies any fevers.  She is very weak unable to really stand out of a wheelchair by herself.  Her husband is very concerned about her having only seen her for the first time since discharge yesterday.  Past Medical History:  Diagnosis Date  . Arthritis    HANDS  . Back pain   . Congestive heart failure (CHF) ([place])   . Depressive disorder   . Diabetes mellitus without complication ([place])    new, A1c 6.9% 04/2020  . DVT (deep venous thrombosis) ([place])    Right LE s/p back surgery stop date lovenox 09/05/17  . Dyspnea   . Gastritis   . GERD (gastroesophageal reflux disease)   . Hypercholesteremia   . Hypertension   . Pneumonia    History reviewed. No pertinent family history. Past Surgical History:  Procedure Laterality Date  . ABDOMINAL AORTOGRAM W/LOWER EXTREMITY Bilateral 11/13/2019   Procedure: ABDOMINAL AORTOGRAM W/LOWER EXTREMITY;  Surgeon: Waynetta Sandy, MD;  Location: [place] [facility];  Service: Cardiovascular;  Laterality: Bilateral;  . ABDOMINAL HYSTERECTOMY    . AORTA - BILATERAL FEMORAL ARTERY BYPASS GRAFT N/A 04/18/2020   Procedure: AORTA BIFEMORAL BYPASS;  Surgeon: Waynetta Sandy, MD;  Location: [facility];  Service: Vascular;  Laterality: N/A;  . AORTIC  ENDARTERECETOMY N/A 04/18/2020   Procedure: abdominal AORTIC ENDARTERECETOMY;  Surgeon: Waynetta Sandy, MD;  Location: [place];  Service: Vascular;  Laterality: N/A;  . APPENDECTOMY    . BACK SURGERY    . CERVICAL SPINE SURGERY    . CHOLECYSTECTOMY    . COLONOSCOPY N/A 04/19/2020   Procedure: COLONOSCOPY;  Surgeon: Ronald Lobo, MD;  Location: [facility] ENDOSCOPY;  Service: Endoscopy;  Laterality: N/A;  bedside exam, pt on ventilator  . DIAGNOSTIC LAPAROSCOPY     lap. chole  . ENDARTERECTOMY FEMORAL Right 04/18/2020   Procedure: RIGHT FEMORAL ENDARTERECTOMY;  Surgeon: Waynetta Sandy, MD;  Location: [place];  Service: Vascular;  Laterality: Right;  . LUMBAR SPINE SURGERY      Short Social History:  Social History   Tobacco Use  . Smoking status: Former Smoker    Packs/day: 1.00    Years: 40.00    Pack years: 40.00    Types: Cigarettes  . Smokeless tobacco: Never Used  Substance Use Topics  . Alcohol use: Not Currently    Allergies  Allergen Reactions  . Zanaflex [Tizanidine Hcl] Hives and Swelling  . Penicillins Itching and Rash    Did it involve swelling of the face/tongue/throat, SOB, or low BP? No Did it involve sudden or severe rash/hives, skin peeling, or any reaction on the inside of your mouth or nose? Yes Did you need to seek medical attention at a hospital or doctor's office? Yes When did  it last happen?40 + years If all above answers are "NO", may proceed with cephalosporin use.     Current Outpatient Medications  Medication Sig Dispense Refill  . albuterol (PROVENTIL) (2.5 MG/3ML) 0.083% nebulizer solution Take 3 mLs (2.5 mg total) by nebulization 2 (two) times daily.    Marland Kitchen arformoterol (BROVANA) 15 MCG/2ML NEBU Take 2 mLs (15 mcg total) by nebulization 2 (two) times daily.    Marland Kitchen aspirin EC 81 MG tablet Take 81 mg by mouth daily.    Marland Kitchen atorvastatin (LIPITOR) 40 MG tablet Take 1 tablet (40 mg total) by mouth daily. 30 tablet 6  . carvedilol  (COREG) 3.125 MG tablet TAKE ONE TABLET BY MOUTH TWICE DAILY (Patient taking differently: Take 3.125 mg by mouth 2 (two) times daily. ) 4 tablet 0  . fexofenadine (ALLEGRA) 180 MG tablet Take 180 mg by mouth daily.    . furosemide (LASIX) 40 MG tablet Take 40 mg by mouth daily.    Marland Kitchen HYDROcodone-acetaminophen (NORCO/VICODIN) 5-325 MG tablet Take 1 tablet by mouth every 6 (six) hours as needed for moderate pain. 30 tablet 0  . lisinopril (PRINIVIL,ZESTRIL) 5 MG tablet Take 5 mg by mouth daily.    Marland Kitchen PARoxetine (PAXIL) 20 MG tablet Take 20 mg by mouth daily.    . potassium chloride SA (K-DUR,KLOR-CON) 20 MEQ tablet TAKE ONE (1) TABLET EACH DAY (Patient taking differently: Take 20 mEq by mouth daily. ) 7 tablet 0   No current facility-administered medications for this visit.    Review of Systems  Constitutional: Positive for fatigue and unexpected weight change.  Respiratory: Positive for shortness of breath.  GI: Gastrointestinal negative.  Musculoskeletal: Musculoskeletal negative.  Skin: Skin negative.  Neurological: Positive for light-headedness.        Objective:  Objective   Vitals:   05/17/20 1417  BP: 129/78  Pulse: 77  Resp: 20  Temp: 97.9 F (36.6 C)  SpO2: 95%  Weight: 185 lb (83.9 kg)  Height: 5' 3"  (1.6 m)   Body mass index is 32.77 kg/m.  Physical Exam She is awake and alert Abdominal incision is clean dry intact with staples Bilateral groins have staples in place there is some fibrinous exudate both incisions but they are intact there is no evidence of erythema or drainage Both feet are warm and well perfused      Assessment/Plan:     69 year old female status post aortobifemoral bypass for critical bilateral lower extremity ischemia.  Patient returned home from SNF yesterday appears to have failure to thrive with decreased p.o. intake and diarrhea.  She appears quite dehydrated and has lost obvious weight since discharge from the hospital.  Perfusion of her  bilateral lower extremities appears intact.  I discussed with the patient and her husband sending her to the emergency department for labs, hydration, likely CT scan to evaluate for colonic ischemia and plan from there.  Patient is reluctant but agreeable and I think they are safe for private vehicle to the emergency department and we will call ahead to make them aware.     Waynetta Sandy MD [facility]

## 2020-05-17 NOTE — ED Provider Notes (Signed)
[facility] EMERGENCY DEPARTMENT Provider Note   CSN: [id number] Arrival date & time: 05/17/20  1459     History Chief Complaint  Patient presents with   Diarrhea   Abdominal Pain    Tonya Hughes is a 69 y.o. woman with history of PAD (s/p aortobifemoral bypass on 04/18/20), chronic combined heart failure (most recent echo 04/12/20, and tobacco abuse who presents to the ED with complaints of abdominal pain, diarrhea, nausea, and decreased PO intake.  The patient states she began having these symptoms soon after being discharged from the hospital on 04/29/20 to a SNF. She was discharged from her SNF to home yesterday. She endorses diffuse, constant, abdominal pain. When asked how frequent her episodes of diarrhea are, she states, "constant." She also complains of nausea, though denies vomiting, and weakness. She denies fever/chills, headache, chest pain, shortness of breath.   On chart review, her postoperative course was complicated by sigmoid ischemia, however she did not require repeat surgery and ultimately was discharged to a SNF. The patient saw her vascular surgeon, Dr.Cain, this morning for a post-operative check. On his evaluation, perfusion of her bilateral lower extremities appeared intact. She was advised to present to the ED for labs, hydration, and likely CT scan to evaluate for colonic ischemia.  According to Care Everywhere, the patient tested negative for C. Difficile on a specimen collected 05/06/20.    Past Medical History:  Diagnosis Date   Arthritis    HANDS   Back pain    Congestive heart failure (CHF) ([facility])    Depressive disorder    Diabetes mellitus without complication ([place])    new, A1c 6.9% 04/2020   DVT (deep venous thrombosis) ([facility])    Right LE s/p back surgery stop date lovenox 09/05/17   Dyspnea    Gastritis    GERD (gastroesophageal reflux disease)    Hypercholesteremia    Hypertension    Pneumonia     Patient Active  Problem List   Diagnosis Date Noted   Hemorrhagic shock ([place])    Peripheral arterial occlusive disease ([place]) 04/18/2020   Shock ([place]) 04/18/2020   Hypertension    Hypercholesteremia    GERD (gastroesophageal reflux disease)    Depressive disorder    Congestive heart failure (CHF) ([facility])    Back pain    Acute deep vein thrombosis (DVT) of proximal vein of right lower extremity ([place]) 08/27/2017   Closed fracture of lumbar vertebra ([place]) 05/31/2017   S/P lumbar fusion 04/21/2017   Lumbar radiculopathy 02/25/2017    Past Surgical History:  Procedure Laterality Date   ABDOMINAL AORTOGRAM W/LOWER EXTREMITY Bilateral 11/13/2019   Procedure: ABDOMINAL AORTOGRAM W/LOWER EXTREMITY;  Surgeon: Waynetta Sandy, MD;  Location: [place] [facility];  Service: Cardiovascular;  Laterality: Bilateral;   ABDOMINAL HYSTERECTOMY     AORTA - BILATERAL FEMORAL ARTERY BYPASS GRAFT N/A 04/18/2020   Procedure: AORTA BIFEMORAL BYPASS;  Surgeon: Waynetta Sandy, MD;  Location: [facility] OR;  Service: Vascular;  Laterality: N/A;   AORTIC ENDARTERECETOMY N/A 04/18/2020   Procedure: abdominal AORTIC ENDARTERECETOMY;  Surgeon: Waynetta Sandy, MD;  Location: [place];  Service: Vascular;  Laterality: N/A;   APPENDECTOMY     BACK SURGERY     CERVICAL SPINE SURGERY     CHOLECYSTECTOMY     COLONOSCOPY N/A 04/19/2020   Procedure: COLONOSCOPY;  Surgeon: Ronald Lobo, MD;  Location: [place];  Service: Endoscopy;  Laterality: N/A;  bedside exam, pt on ventilator   DIAGNOSTIC  LAPAROSCOPY     lap. chole   ENDARTERECTOMY FEMORAL Right 04/18/2020   Procedure: RIGHT FEMORAL ENDARTERECTOMY;  Surgeon: Waynetta Sandy, MD;  Location: [facility];  Service: Vascular;  Laterality: Right;   LUMBAR SPINE SURGERY       OB History   No obstetric history on file.     History reviewed. No pertinent family history.  Social History   Tobacco Use   Smoking status: Former  Smoker    Packs/day: 1.00    Years: 40.00    Pack years: 40.00    Types: Cigarettes   Smokeless tobacco: Never Used  Scientific laboratory technician Use: Never used  Substance Use Topics   Alcohol use: Not Currently   Drug use: Never    Home Medications Prior to Admission medications   Medication Sig Start Date End Date Taking? Authorizing Provider  aspirin EC 81 MG tablet Take 81 mg by mouth daily.   Yes [provider]  atorvastatin (LIPITOR) 40 MG tablet Take 1 tablet (40 mg total) by mouth daily. Patient taking differently: Take 40 mg by mouth at bedtime.  03/25/20 06/23/20 Yes Herminio Commons, MD  carvedilol (COREG) 3.125 MG tablet TAKE ONE TABLET BY MOUTH TWICE DAILY Patient taking differently: Take 3.125 mg by mouth 2 (two) times daily.  09/21/19  Yes Herminio Commons, MD  fexofenadine (ALLEGRA) 180 MG tablet Take 180 mg by mouth daily.   Yes [provider]  furosemide (LASIX) 40 MG tablet Take 40 mg by mouth daily.   Yes [provider]  ibuprofen (ADVIL) 200 MG tablet Take 600 mg by mouth every 6 (six) hours as needed for headache or mild pain.   Yes [provider]  lisinopril (PRINIVIL,ZESTRIL) 5 MG tablet Take 5 mg by mouth daily.   Yes [provider]  PARoxetine (PAXIL) 20 MG tablet Take 20 mg by mouth daily.   Yes [provider]  potassium chloride SA (K-DUR,KLOR-CON) 20 MEQ tablet TAKE ONE (1) TABLET EACH DAY Patient taking differently: Take 20 mEq by mouth daily.  01/03/19  Yes Herminio Commons, MD  albuterol (PROVENTIL) (2.5 MG/3ML) 0.083% nebulizer solution Take 3 mLs (2.5 mg total) by nebulization 2 (two) times daily. 04/29/20   Rhyne, Hulen Shouts, PA-C  arformoterol (BROVANA) 15 MCG/2ML NEBU Take 2 mLs (15 mcg total) by nebulization 2 (two) times daily. 04/29/20   Rhyne, Hulen Shouts, PA-C  HYDROcodone-acetaminophen (NORCO/VICODIN) 5-325 MG tablet Take 1 tablet by mouth every 6 (six) hours as needed for moderate  pain. Patient not taking: Reported on 05/17/2020 04/29/20   Gabriel Earing, PA-C    Allergies    Zanaflex [tizanidine hcl] and Penicillins  Review of Systems   Review of Systems  All other systems reviewed and are negative.  10 systems reviewed and are negative for acute changes, except as noted in the HPI.  Physical Exam Updated Vital Signs BP 119/66    Pulse 69    Temp 97.8 F (36.6 C) (Oral)    Resp 10    SpO2 96%   Physical Exam Constitutional:      Appearance: She is ill-appearing.     Comments: Patient appears pale and very uncomfortable  HENT:     Head: Normocephalic and atraumatic.     Mouth/Throat:     Mouth: Mucous membranes are dry.  Eyes:     Extraocular Movements: Extraocular movements intact.     Pupils: Pupils are equal, round, and reactive to  light.  Cardiovascular:     Rate and Rhythm: Normal rate and regular rhythm.     Pulses: Normal pulses.     Heart sounds: Normal heart sounds.  Pulmonary:     Effort: Pulmonary effort is normal.     Breath sounds: Normal breath sounds.  Abdominal:     General: A surgical scar is present. Bowel sounds are increased.     Palpations: Abdomen is soft.     Tenderness: There is abdominal tenderness. There is guarding.     Comments: Abdominal incision is clean dry intact with staples  Musculoskeletal:     Cervical back: Neck supple.     Right lower leg: No edema.     Left lower leg: No edema.  Skin:    General: Skin is warm and dry.     Coloration: Skin is pale.  Neurological:     Mental Status: She is alert and oriented to person, place, and time.     ED Results / Procedures / Treatments   Labs (all labs ordered are listed, but only abnormal results are displayed) Labs Reviewed  C DIFFICILE QUICK SCREEN W PCR REFLEX - Abnormal; Notable for the following components:      Result Value   C Diff antigen POSITIVE (*)    All other components within normal limits  COMPREHENSIVE METABOLIC PANEL - Abnormal;  Notable for the following components:   Sodium 128 (*)    Potassium 3.1 (*)    Chloride 86 (*)    Glucose, Bld 123 (*)    Creatinine, Ser 1.32 (*)    Calcium 8.4 (*)    Albumin 2.6 (*)    GFR calc non Af Amer 42 (*)    GFR calc Af Amer 48 (*)    Anion gap 16 (*)    All other components within normal limits  CBC - Abnormal; Notable for the following components:   RBC 3.86 (*)    Hemoglobin 10.8 (*)    HCT 33.8 (*)    RDW 16.4 (*)    All other components within normal limits  CLOSTRIDIUM DIFFICILE BY PCR, REFLEXED  LIPASE, BLOOD  URINALYSIS, ROUTINE W REFLEX MICROSCOPIC  LACTIC ACID, PLASMA  LACTIC ACID, PLASMA    EKG EKG Interpretation  Date/Time:  Friday May 17 2020 15:13:09 EDT Ventricular Rate:  88 PR Interval:  152 QRS Duration: 80 QT Interval:  382 QTC Calculation: 462 R Axis:   25 Text Interpretation: Normal sinus rhythm Cannot rule out Inferior infarct , age undetermined Cannot rule out Anterior infarct , age undetermined No significant change since last tracing Confirmed by Blanchie Dessert [phone]) on 05/17/2020 5:02:58 PM   Radiology CT Angio Abd/Pel W and/or Wo Contrast  Result Date: 05/17/2020 CLINICAL DATA:  69 year old female with generalized abdominal pain and decreased p.o. intake. Concern for ischemia. EXAM: CTA ABDOMEN AND PELVIS WITHOUT AND WITH CONTRAST TECHNIQUE: Multidetector CT imaging of the abdomen and pelvis was performed using the standard protocol during bolus administration of intravenous contrast. Multiplanar reconstructed images and MIPs were obtained and reviewed to evaluate the vascular anatomy. CONTRAST:  167m OMNIPAQUE IOHEXOL 350 MG/ML SOLN COMPARISON:  CT abdomen pelvis dated 04/20/2020. FINDINGS: VASCULAR Aorta: There is advanced atherosclerotic calcification. No aneurysmal dilatation or dissection. There is complete occlusion of the distal abdominal aorta. Postsurgical changes of an aortobifemoral bypass graft. Slight irregularity of the  aortic and stem oasis, likely postsurgical. The bypass graft is patent to the level of the common femoral artery. There  is stranding and fluid surrounding the aortic and stem oasis, likely postsurgical. No drainable fluid collection or abscess identified at this location. Celiac: There is atherosclerotic calcification of the origin of the celiac axis. The celiac artery and its major branches remain patent. SMA: Atherosclerotic calcification of the SMA. The SMA remains patent. Renals: The renal arteries are patent. 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 IMA: There is nonvisualization of the origin of the IMA possibly due to compression by periaortic fluid and edema. There is reconstitution of flow within the IMA. Inflow: There is advanced atherosclerotic calcification of the iliac arteries. There is high-grade occlusion of the common iliac arteries bilaterally with very diminished flow in the native external and internal iliac arteries. The iliac limbs of the bypass graft are patent. Proximal Outflow: There is high-grade narrowing of the distal anastomosis of the left iliac limb of the bypass graft at the anastomosis with the common femoral artery. There is advanced atherosclerotic calcification of the proximal portion of the common femoral, superficial and deep femoral arteries. These arteries appear narrowed with diminished flow but remain patent. There is atherosclerotic calcification and luminal narrowing of the right common femoral artery and visualized proximal superficial and deep femoral arteries. These arteries however remain patent. Veins: The IVC is unremarkable. The SMV, splenic vein, and main portal vein are patent. No portal venous gas. Review of the MIP images confirms the above findings. NON-VASCULAR Lower chest: The visualized lung bases are clear. There is multi vessel coronary vascular calcification. No intra-abdominal free air or free fluid. Hepatobiliary: The liver is unremarkable. No  intrahepatic biliary ductal dilatation. Cholecystectomy. No retained calcified stone noted in the central CBD. Pancreas: Unremarkable. No pancreatic ductal dilatation or surrounding inflammatory changes. Spleen: Normal in size without focal abnormality. Adrenals/Urinary Tract: The adrenal glands are unremarkable. There is mild right hydronephrosis similar to prior CT. There is no hydronephrosis on the left. Evaluation of the ureters is limited due to streak artifact caused by spinal hardware. The urinary bladder is unremarkable. Stomach/Bowel: There is diffuse circumferential thickening of the distal descending colon and rectosigmoid consistent with colitis. Interval improvement in the inflammatory changes of the splenic flexure since the prior CT. No pneumatosis. No definite evidence of perforation. No bowel obstruction. There is a 5 cm outpouching of the proximal stomach, likely a gastric diverticula. Lymphatic: No adenopathy. Reproductive: Hysterectomy. Other: Somewhat loculated fluid collections in the groins bilaterally adjacent to the distal bypass graft anastomosis most consistent with seroma. Developing infection is not excluded. These fluid collections have increased in size since the prior CT and measure approximately 5.5 x 1.5 cm on the left and 4.0 x 2.2 cm on the right. Musculoskeletal: Osteopenia with degenerative changes of the spine and severe postsurgical changes and posterior fusion. No acute osseous pathology. IMPRESSION: 1. Colitis of the distal descending colon and rectosigmoid with improved inflammatory changes of the splenic flexure seen on the prior CT. No pneumatosis. No free air or portal venous gas. 2. Occluded distal native aorta and an aorto bi femoral bypass graft. The graft is patent. 3. Atherosclerotic calcification and narrowing of the bilateral femoral arteries. 4. Loculated appearing fluid collections adjacent to the femoral and sternum oasis of the bypass graft, increased in size  since the prior CT. These may represent seroma although an infectious process is not excluded. 5. Mild right hydronephrosis similar to prior CT. 6. A 5 cm gastric diverticula. 7. Aortic Atherosclerosis (ICD10-I70.0). Electronically Signed   By: Laren Everts.D.  On: 05/17/2020 21:15    Medications Ordered in ED Medications  sodium chloride flush (NS) 0.9 % injection 3 mL (3 mLs Intravenous Given 05/17/20 1929)  ondansetron (ZOFRAN) injection 4 mg (4 mg Intravenous Given 05/17/20 1754)  fentaNYL (SUBLIMAZE) injection 25 mcg (25 mcg Intravenous Given 05/17/20 1753)  lactated ringers bolus 1,000 mL (1,000 mLs Intravenous New Bag/Given 05/17/20 1754)  iohexol (OMNIPAQUE) 350 MG/ML injection 100 mL (100 mLs Intravenous Contrast Given 05/17/20 1956)    ED Course  I have reviewed the triage vital signs and the nursing notes.  Pertinent labs & imaging results that were available during my care of the patient were reviewed by me and considered in my medical decision making (see chart for details).    MDM Rules/Calculators/A&P                          Tonya Hughes is a 69 y.o. woman with history of PAD (s/p aortobifemoral bypass on 04/18/20), chronic combined heart failure (most recent echo 04/12/20), and tobacco abuse who presents to the ED with complaints of abdominal pain, diarrhea, nausea, and decreased PO intake.  The patient is ill-appearing though hemodynamically stable. She has diffuse abdominal pain and guarding on exam, appears dry. Denies respiratory or cardiac complaints. Lab work-up demonstrates hyponatremia (128), hypokalemia (3.1), stable anemia (10.8). She was tested for C. Difficile on 05/06/20, however will retest. Will obtain CTA of her abdomen and pelvis to evaluate for bowel ischemia vs colitis vs partial obstruction. Will treat with IVF, Zofran, and fentanyl.  9:20 PM CTA of abdomen and pelvis demonstrates colitis of the distal descending colon and rectosigmoid, no pneumatosis.  Aorto bifemoral bypass graft is patent. No evidence of bowel ischemia.   10:30 PM Discussed with the on-call vascular surgeon who suspects patient has ischemic colitis and recommends admission for IV antibiotics and pain control.    Final Clinical Impression(s) / ED Diagnoses Final diagnoses:  Ischemic colitis [facility])    Rx / [place] Orders ED Discharge Orders    None       Alexandria Lodge, MD 05/17/20 2239    Blanchie Dessert, MD 05/19/20 1652

## 2020-05-17 NOTE — H&P (Signed)
[facility] History and Physical  Tonya Hughes YWV:[id number] DOB: 1951-06-06 DOA: 05/17/2020  Referring EDP: Maryan Rued PCP: Monico Blitz, MD   Chief Complaint: Abdominal pain, weakness, diarrhea  HPI: Tonya Hughes is a 69 y.o. female with PMH of PAD (s/p aortobifemoral bypass on 8/54/62), diastolic heart failure, HTN, HLD, MDD who presents to the ED with complaints of abdominal pain, weakness and diarrhea and admitted per Vascular request due to concern for potential ischemic colitis.   Patient is a poor historian. She was admitted last month and had aortobifemoral bypass done on 6/17. She then developed colonic ischemia on POD#1 and was treated with IV abx and required pressors. Slow improvement thereafter and eventually discharged to SNF on 6/28. Shortly after discharge, patient reports she started having frequent diarrhea along with constant abdominal pain and poor PO intake. Reports feeling very weak which was the main reason she came to ED. She was Vascular this morning and they sent her to ER for labs, hydration and CT scan to evaluate for colonic ischemia. Denies headache, dizziness, fever, chills, cough, SOB, chest pain, constipation, dysuria, hematuria, hematochezia, melena, difficulty moving arms/legs, speech difficulty, confusion or any other complaints.   In the ED: Vitals stable on room air. Labs remarkable for Lipase WNL, Na 128, K 3.1, glucose 123, Cr 1.32, AG 16, WBC 9.7, Hgb 10.8. C diff testing with pos antigen, neg toxin but positive PCR.  CT Abd/Pelv: 1. Colitis of the distal descending colon and rectosigmoid with improved inflammatory changes of the splenic flexure seen on the prior CT. No pneumatosis. No free air or portal venous gas. 2. Occluded distal native aorta and an aorto bi femoral bypass graft. The graft is patent. 3. Atherosclerotic calcification and narrowing of the bilateral femoral arteries. 4. Loculated appearing fluid collections adjacent to the  femoral and sternum oasis of the bypass graft, increased in size since the prior CT. These may represent seroma although an infectious process is not excluded. 5. Mild right hydronephrosis similar to prior CT. 6. A 5 cm gastric diverticula. 7. Aortic Atherosclerosis (ICD10-I70.0).  EDP d/w Vascular who recommended admission for IV abx and pain control. Patient was given Fentanyl, Zofran, Flagyl.  Review of Systems:  All other systems negative unless noted above in HPI.   Past Medical History:  Diagnosis Date  . Arthritis    HANDS  . Back pain   . Congestive heart failure (CHF) ([place])   . Depressive disorder   . Diabetes mellitus without complication ([place])    new, A1c 6.9% 04/2020  . DVT (deep venous thrombosis) ([place])    Right LE s/p back surgery stop date lovenox 09/05/17  . Dyspnea   . Gastritis   . GERD (gastroesophageal reflux disease)   . Hypercholesteremia   . Hypertension   . Pneumonia    Past Surgical History:  Procedure Laterality Date  . ABDOMINAL AORTOGRAM W/LOWER EXTREMITY Bilateral 11/13/2019   Procedure: ABDOMINAL AORTOGRAM W/LOWER EXTREMITY;  Surgeon: Waynetta Sandy, MD;  Location: [facility];  Service: Cardiovascular;  Laterality: Bilateral;  . ABDOMINAL HYSTERECTOMY    . AORTA - BILATERAL FEMORAL ARTERY BYPASS GRAFT N/A 04/18/2020   Procedure: AORTA BIFEMORAL BYPASS;  Surgeon: Waynetta Sandy, MD;  [facility];  Service: Vascular;  Laterality: N/A;  . AORTIC ENDARTERECETOMY N/A 04/18/2020   Procedure: abdominal AORTIC ENDARTERECETOMY;  Surgeon: Waynetta Sandy, MD;  Location: [facility];  Service: Vascular;  Laterality: N/A;  . APPENDECTOMY    . BACK SURGERY    .  CERVICAL SPINE SURGERY    . CHOLECYSTECTOMY    . COLONOSCOPY N/A 04/19/2020   Procedure: COLONOSCOPY;  Surgeon: Ronald Lobo, MD;  Location: [facility] ENDOSCOPY;  Service: Endoscopy;  Laterality: N/A;  bedside exam, pt on ventilator  . DIAGNOSTIC LAPAROSCOPY     lap.  chole  . ENDARTERECTOMY FEMORAL Right 04/18/2020   Procedure: RIGHT FEMORAL ENDARTERECTOMY;  Surgeon: Waynetta Sandy, MD;  Location: [place];  Service: Vascular;  Laterality: Right;  . LUMBAR SPINE SURGERY     Social History:  reports that she has quit smoking. Her smoking use included cigarettes. She has a 40.00 pack-year smoking history. She has never used smokeless tobacco. She reports previous alcohol use. She reports that she does not use drugs.  Allergies  Allergen Reactions  . Zanaflex [Tizanidine Hcl] Hives and Swelling  . Penicillins Itching and Rash    Did it involve swelling of the face/tongue/throat, SOB, or low BP? No Did it involve sudden or severe rash/hives, skin peeling, or any reaction on the inside of your mouth or nose? Yes Did you need to seek medical attention at a hospital or doctor's office? Yes When did it last happen?40 + years ago If all above answers are "NO", may proceed with cephalosporin use.     Family History  Problem Relation Age of Onset  . Hypertension Father      Prior to Admission medications   Medication Sig Start Date End Date Taking? Authorizing Provider  aspirin EC 81 MG tablet Take 81 mg by mouth daily.   Yes [provider]  atorvastatin (LIPITOR) 40 MG tablet Take 1 tablet (40 mg total) by mouth daily. Patient taking differently: Take 40 mg by mouth at bedtime.  03/25/20 06/23/20 Yes Herminio Commons, MD  carvedilol (COREG) 3.125 MG tablet TAKE ONE TABLET BY MOUTH TWICE DAILY Patient taking differently: Take 3.125 mg by mouth 2 (two) times daily.  09/21/19  Yes Herminio Commons, MD  fexofenadine (ALLEGRA) 180 MG tablet Take 180 mg by mouth daily.   Yes [provider]  furosemide (LASIX) 40 MG tablet Take 40 mg by mouth daily.   Yes [provider]  ibuprofen (ADVIL) 200 MG tablet Take 600 mg by mouth every 6 (six) hours as needed for headache or mild pain.   Yes [provider]    lisinopril (PRINIVIL,ZESTRIL) 5 MG tablet Take 5 mg by mouth daily.   Yes [provider]  PARoxetine (PAXIL) 20 MG tablet Take 20 mg by mouth daily.   Yes [provider]  potassium chloride SA (K-DUR,KLOR-CON) 20 MEQ tablet TAKE ONE (1) TABLET EACH DAY Patient taking differently: Take 20 mEq by mouth daily.  01/03/19  Yes Herminio Commons, MD  albuterol (PROVENTIL) (2.5 MG/3ML) 0.083% nebulizer solution Take 3 mLs (2.5 mg total) by nebulization 2 (two) times daily. 04/29/20   Rhyne, Hulen Shouts, PA-C  arformoterol (BROVANA) 15 MCG/2ML NEBU Take 2 mLs (15 mcg total) by nebulization 2 (two) times daily. 04/29/20   Rhyne, Hulen Shouts, PA-C  HYDROcodone-acetaminophen (NORCO/VICODIN) 5-325 MG tablet Take 1 tablet by mouth every 6 (six) hours as needed for moderate pain. Patient not taking: Reported on 05/17/2020 04/29/20   Gabriel Earing, PA-C   Physical Exam: Vitals:   05/17/20 1511 05/17/20 1713 05/17/20 1728 05/17/20 1743  BP: 93/65 123/69 129/72 119/66  Pulse: 93 73 73 69  Resp: 18 (!) 23 16 10   Temp: 97.8 F (36.6 C)     TempSrc: Oral  SpO2: 99% 95% 94% 96%    Wt Readings from Last 3 Encounters:  05/17/20 83.9 kg  04/29/20 84.1 kg  04/15/20 80.9 kg    . General:  Appears ill and fatigued. AAOx4. Appears pale.  . Eyes: EOMI, normal lids, irises & conjunctiva . ENT: grossly normal hearing, dry mucous membranes . Neck: normal ROM . Cardiovascular: RRR, no m/r/g. No LE edema. Marland Kitchen Respiratory: CTA bilaterally, no w/r/r. Normal respiratory effort. . Abdomen: soft without distention but diffuse tenderness to light palpation, midline surgical scar with staples appears CDI . Skin: no rash or induration seen on limited exam, healing surgical scar in right groin also CDI . Musculoskeletal: grossly normal tone BUE/BLE . Psychiatric: grossly normal mood and affect, speech fluent and appropriate . Neurologic: grossly non-focal.          Labs on Admission:  Basic  Metabolic Panel: Recent Labs  Lab 05/17/20 1518  NA 128*  K 3.1*  CL 86*  CO2 26  GLUCOSE 123*  BUN 18  CREATININE 1.32*  CALCIUM 8.4*   Liver Function Tests: Recent Labs  Lab 05/17/20 1518  AST 29  ALT 15  ALKPHOS 66  BILITOT 0.9  PROT 7.4  ALBUMIN 2.6*   Recent Labs  Lab 05/17/20 1518  LIPASE 28   No results for input(s): AMMONIA in the last 168 hours. CBC: Recent Labs  Lab 05/17/20 1518  WBC 9.7  HGB 10.8*  HCT 33.8*  MCV 87.6  PLT 304   Cardiac Enzymes: No results for input(s): CKTOTAL, CKMB, CKMBINDEX, TROPONINI in the last 168 hours.  BNP (last 3 results) No results for input(s): BNP in the last 8760 hours.  ProBNP (last 3 results) No results for input(s): PROBNP in the last 8760 hours.  CBG: No results for input(s): GLUCAP in the last 168 hours.  Radiological Exams on Admission: CT Angio Abd/Pel W and/or Wo Contrast  Result Date: 05/17/2020 CLINICAL DATA:  69 year old female with generalized abdominal pain and decreased p.o. intake. Concern for ischemia. EXAM: CTA ABDOMEN AND PELVIS WITHOUT AND WITH CONTRAST TECHNIQUE: Multidetector CT imaging of the abdomen and pelvis was performed using the standard protocol during bolus administration of intravenous contrast. Multiplanar reconstructed images and MIPs were obtained and reviewed to evaluate the vascular anatomy. CONTRAST:  126m OMNIPAQUE IOHEXOL 350 MG/ML SOLN COMPARISON:  CT abdomen pelvis dated 04/20/2020. FINDINGS: VASCULAR Aorta: There is advanced atherosclerotic calcification. No aneurysmal dilatation or dissection. There is complete occlusion of the distal abdominal aorta. Postsurgical changes of an aortobifemoral bypass graft. Slight irregularity of the aortic and stem oasis, likely postsurgical. The bypass graft is patent to the level of the common femoral artery. There is stranding and fluid surrounding the aortic and stem oasis, likely postsurgical. No drainable fluid collection or abscess  identified at this location. Celiac: There is atherosclerotic calcification of the origin of the celiac axis. The celiac artery and its major branches remain patent. SMA: Atherosclerotic calcification of the SMA. The SMA remains patent. Renals: The renal arteries are patent. 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 IMA: There is nonvisualization of the origin of the IMA possibly due to compression by periaortic fluid and edema. There is reconstitution of flow within the IMA. Inflow: There is advanced atherosclerotic calcification of the iliac arteries. There is high-grade occlusion of the common iliac arteries bilaterally with very diminished flow in the native external and internal iliac arteries. The iliac limbs of the bypass graft are patent. Proximal Outflow: There is high-grade narrowing  of the distal anastomosis of the left iliac limb of the bypass graft at the anastomosis with the common femoral artery. There is advanced atherosclerotic calcification of the proximal portion of the common femoral, superficial and deep femoral arteries. These arteries appear narrowed with diminished flow but remain patent. There is atherosclerotic calcification and luminal narrowing of the right common femoral artery and visualized proximal superficial and deep femoral arteries. These arteries however remain patent. Veins: The IVC is unremarkable. The SMV, splenic vein, and main portal vein are patent. No portal venous gas. Review of the MIP images confirms the above findings. NON-VASCULAR Lower chest: The visualized lung bases are clear. There is multi vessel coronary vascular calcification. No intra-abdominal free air or free fluid. Hepatobiliary: The liver is unremarkable. No intrahepatic biliary ductal dilatation. Cholecystectomy. No retained calcified stone noted in the central CBD. Pancreas: Unremarkable. No pancreatic ductal dilatation or surrounding inflammatory changes. Spleen: Normal in size without focal  abnormality. Adrenals/Urinary Tract: The adrenal glands are unremarkable. There is mild right hydronephrosis similar to prior CT. There is no hydronephrosis on the left. Evaluation of the ureters is limited due to streak artifact caused by spinal hardware. The urinary bladder is unremarkable. Stomach/Bowel: There is diffuse circumferential thickening of the distal descending colon and rectosigmoid consistent with colitis. Interval improvement in the inflammatory changes of the splenic flexure since the prior CT. No pneumatosis. No definite evidence of perforation. No bowel obstruction. There is a 5 cm outpouching of the proximal stomach, likely a gastric diverticula. Lymphatic: No adenopathy. Reproductive: Hysterectomy. Other: Somewhat loculated fluid collections in the groins bilaterally adjacent to the distal bypass graft anastomosis most consistent with seroma. Developing infection is not excluded. These fluid collections have increased in size since the prior CT and measure approximately 5.5 x 1.5 cm on the left and 4.0 x 2.2 cm on the right. Musculoskeletal: Osteopenia with degenerative changes of the spine and severe postsurgical changes and posterior fusion. No acute osseous pathology. IMPRESSION: 1. Colitis of the distal descending colon and rectosigmoid with improved inflammatory changes of the splenic flexure seen on the prior CT. No pneumatosis. No free air or portal venous gas. 2. Occluded distal native aorta and an aorto bi femoral bypass graft. The graft is patent. 3. Atherosclerotic calcification and narrowing of the bilateral femoral arteries. 4. Loculated appearing fluid collections adjacent to the femoral and sternum oasis of the bypass graft, increased in size since the prior CT. These may represent seroma although an infectious process is not excluded. 5. Mild right hydronephrosis similar to prior CT. 6. A 5 cm gastric diverticula. 7. Aortic Atherosclerosis (ICD10-I70.0). Electronically Signed    By: Anner Crete M.D.   On: 05/17/2020 21:15    EKG: Independently reviewed. HR 90. Sinus rhythm. QTc 462. No STEMI.  Assessment/Plan Principal Problem:   Abdominal pain Active Problems:   Hypertension   Hypercholesteremia   Depressive disorder   Congestive heart failure (CHF) (H[facility])   Peripheral arterial occlusive disease ([facility])   Hyponatremia   Diarrhea   C. difficile colitis  69 y.o. female with PMH of PAD (s/p aortobifemoral bypass on 03/11/31), diastolic heart failure, HTN, HLD, MDD who presents to the ED with complaints of abdominal pain, weakness and diarrhea and admitted per Vascular request due to concern for potential ischemic colitis.   Abdominal Pain Diarrhea  C Diff Colitis  - presents with several weeks of diarrhea and abdominal pain with poor PO intake - Vascular concerned for colonic ischemia; CT with colitis -  Vascular following  - C Diff testing with positive antigen, negative toxin which suggests not causing symptoms however PCR also positive and patient symptomatic; will treat  - Oral Vanc x10 days - Will also treat for possible ischemic colitis with Flagyl and Rocephin; d/w pharmacy  - Zofran and Fentanyl ordered - Lactate pending - Serial abdominal exams - NPO for tonight; consider advancing in AM  - Heparin SubQ  Hyponatremia - Na 128 on admission; previously 140 on 6/25 - possibly secondary to GI losses and poor PO intake - hold home Lasix - give 1 L NS (hx diastolic CHF with normal EF) - Urine studies ordered  AKI vs new CKD - Cr 1.32 on admission and near where patient was at discharge however on 6/14, Cr 0.89 as well as 0.80 in Jan 2021 - cont to monitor   Hypertension - hold home lasix, Lisinopril and Coreg for now due to borderline low BP's and possible AKI  HTN - cont home Lipitor  MDD - cont home Paxil  COPD - cont home Albuterol and Brovana although patient reportedly not using at home   Code Status: Full DVT Prophylaxis:  Heparin Family Communication: None Disposition Plan: Admit to inpatient. Patient requiring specialty consultation and further monitoring of labs. Patient is at high risk for further decompensation due to age and co-morbidities. Anticipate discharge to SNF in 4-5 days.   Time spent: 70 minutes  Chauncey Mann, MD [facility] Pager [phone]

## 2020-05-18 DIAGNOSIS — E871 Hypo-osmolality and hyponatremia: Secondary | ICD-10-CM

## 2020-05-18 DIAGNOSIS — K559 Vascular disorder of intestine, unspecified: Secondary | ICD-10-CM | POA: Diagnosis not present

## 2020-05-18 DIAGNOSIS — A0472 Enterocolitis due to Clostridium difficile, not specified as recurrent: Secondary | ICD-10-CM | POA: Diagnosis not present

## 2020-05-18 DIAGNOSIS — F329 Major depressive disorder, single episode, unspecified: Secondary | ICD-10-CM | POA: Diagnosis not present

## 2020-05-18 DIAGNOSIS — I10 Essential (primary) hypertension: Secondary | ICD-10-CM | POA: Diagnosis not present

## 2020-05-18 LAB — BASIC METABOLIC PANEL
Anion gap: 12 (ref 5–15)
BUN: 15 mg/dL (ref 8–23)
CO2: 29 mmol/L (ref 22–32)
Calcium: 7.9 mg/dL — ABNORMAL LOW (ref 8.9–10.3)
Chloride: 91 mmol/L — ABNORMAL LOW (ref 98–111)
Creatinine, Ser: 1.11 mg/dL — ABNORMAL HIGH (ref 0.44–1.00)
GFR calc Af Amer: 60 mL/min — ABNORMAL LOW (ref >60)
GFR calc non Af Amer: 51 mL/min — ABNORMAL LOW (ref >60)
Glucose, Bld: 94 mg/dL (ref 70–99)
Potassium: 3.3 mmol/L — ABNORMAL LOW (ref 3.5–5.1)
Sodium: 132 mmol/L — ABNORMAL LOW (ref 135–145)

## 2020-05-18 LAB — GLUCOSE, CAPILLARY
Glucose-Capillary: 97 mg/dL (ref 70–99)
Glucose-Capillary: 89 mg/dL (ref 70–99)
Glucose-Capillary: 83 mg/dL (ref 70–99)
Glucose-Capillary: 155 mg/dL — ABNORMAL HIGH (ref 70–99)

## 2020-05-18 LAB — OSMOLALITY: Osmolality: 277 mOsm/kg (ref 275–295)

## 2020-05-18 LAB — CBC
HCT: 28 % — ABNORMAL LOW (ref 36.0–46.0)
Hemoglobin: 9.2 g/dL — ABNORMAL LOW (ref 12.0–15.0)
MCH: 29.2 pg (ref 26.0–34.0)
MCHC: 32.9 g/dL (ref 30.0–36.0)
MCV: 88.9 fL (ref 80.0–100.0)
Platelets: 240 10*3/uL (ref 150–400)
RBC: 3.15 MIL/uL — ABNORMAL LOW (ref 3.87–5.11)
RDW: 16.5 % — ABNORMAL HIGH (ref 11.5–15.5)
WBC: 6.5 10*3/uL (ref 4.0–10.5)
nRBC: 0 % (ref 0.0–0.2)

## 2020-05-18 LAB — HEMOGLOBIN AND HEMATOCRIT, BLOOD
HCT: 27.5 % — ABNORMAL LOW (ref 36.0–46.0)
HCT: 25.1 % — ABNORMAL LOW (ref 36.0–46.0)
Hemoglobin: 8.6 g/dL — ABNORMAL LOW (ref 12.0–15.0)
Hemoglobin: 8 g/dL — ABNORMAL LOW (ref 12.0–15.0)

## 2020-05-18 LAB — SARS CORONAVIRUS 2 BY RT PCR (HOSPITAL ORDER, PERFORMED IN ~~LOC~~ HOSPITAL LAB): SARS Coronavirus 2: NEGATIVE

## 2020-05-18 LAB — MAGNESIUM: Magnesium: 1.8 mg/dL (ref 1.7–2.4)

## 2020-05-18 LAB — LACTIC ACID, PLASMA
Lactic Acid, Venous: 1.1 mmol/L (ref 0.5–1.9)
Lactic Acid, Venous: 0.9 mmol/L (ref 0.5–1.9)

## 2020-05-18 LAB — PHOSPHORUS: Phosphorus: 4.5 mg/dL (ref 2.5–4.6)

## 2020-05-18 MED ORDER — SODIUM CHLORIDE 0.9% FLUSH
10.0000 mL | Freq: Two times a day (BID) | INTRAVENOUS | Status: DC
  Administered 2020-05-18 – 2020-05-29 (×12): 10 mL

## 2020-05-18 MED ORDER — SODIUM CHLORIDE 0.9% FLUSH: 10.0000 mL | INTRAVENOUS | Status: DC | PRN

## 2020-05-18 MED ORDER — METRONIDAZOLE IN NACL 5-0.79 MG/ML-% IV SOLN
500.0000 mg | Freq: Three times a day (TID) | INTRAVENOUS | Status: DC
  Administered 2020-05-18 – 2020-05-30 (×37): 500 mg via INTRAVENOUS
  Filled 2020-05-18 (×37): qty 100

## 2020-05-18 MED ORDER — SODIUM CHLORIDE 0.9 % IV SOLN: INTRAVENOUS | Status: DC

## 2020-05-18 MED ORDER — POTASSIUM CHLORIDE CRYS ER 20 MEQ PO TBCR
40.0000 meq | EXTENDED_RELEASE_TABLET | ORAL | Status: AC
  Administered 2020-05-18 (×2): 40 meq via ORAL
  Filled 2020-05-18 (×2): qty 2

## 2020-05-18 MED ORDER — SODIUM CHLORIDE 0.9 % IV SOLN: INTRAVENOUS | Status: AC

## 2020-05-18 NOTE — ED Notes (Signed)
RN has attempted IV access on pt twice, pt has multiple IV medications due that are not compatiable, an order has been placed to [place] IV team RN awaiting their arrival

## 2020-05-18 NOTE — Plan of Care (Signed)
  Problem: Education: Goal: Knowledge of General Education information will improve Description: Including pain rating scale, medication(s)/side effects and non-pharmacologic comfort measures Outcome: Progressing   Problem: Health Behavior/Discharge Planning: Goal: Ability to manage health-related needs will improve Outcome: Progressing   Problem: Clinical Measurements: Goal: Will remain free from infection Outcome: Progressing Goal: Respiratory complications will improve Outcome: Progressing Goal: Cardiovascular complication will be avoided Outcome: Progressing   Problem: Nutrition: Goal: Adequate nutrition will be maintained Outcome: Progressing   Problem: Elimination: Goal: Will not experience complications related to bowel motility Outcome: Progressing Goal: Will not experience complications related to urinary retention Outcome: Progressing   Problem: Pain Managment: Goal: General experience of comfort will improve Outcome: Progressing   Problem: Safety: Goal: Ability to remain free from injury will improve Outcome: Progressing   Problem: Skin Integrity: Goal: Risk for impaired skin integrity will decrease Outcome: Progressing

## 2020-05-18 NOTE — Progress Notes (Signed)
Patient has had several bloody bowel movements with clots. Sent secure chat to Dr Starla Link and he has placed orders. Contacted vascular per Dr Lindalou Hose request. Patient VSS, NAD. H&H stable. Will monitor and notify oncoming shift.

## 2020-05-18 NOTE — ED Notes (Signed)
[place] IV team in room now

## 2020-05-18 NOTE — Evaluation (Signed)
Physical Therapy Evaluation Patient Details Name: Tonya Hughes MRN: [id number] DOB: [birth year] Today's Date: 05/18/2020   History of Present Illness  Pt is a 69 year old woman admitted one day after d/c from SNF with abdominal pain and diarrhea, hyponatremia and AKI. Possible ischemic colitis. Pt discharged from [facility] on 04/29/20 after admission for aortobifemoral BPG with complication of sigmoid ischemia. PMH: CHF, COPD, MDD, HTN, HLD.    Clinical Impression  Pt admitted one day after discharge from SNF; at her baseline, she ambulates with a Rollator and is independent with ADL's. At PT evaluation, pt very upset and frustrated at continued NPO status. Requiring min assist for stand pivot transfers using a walker. Presents with generalized weakness, decreased endurance and balance deficits. Recommend HHPT to maximize functional mobility upon discharge.     Follow Up Recommendations Home health PT;Supervision/Assistance - 24 hour    Equipment Recommendations  None recommended by PT    Recommendations for Other Services       Precautions / Restrictions Precautions Precautions: Fall Restrictions Weight Bearing Restrictions: No      Mobility  Bed Mobility Overal bed mobility: Needs Assistance Bed Mobility: Supine to Sit     Supine to sit: Min assist;HOB elevated     General bed mobility comments: assist to raise trunk  Transfers Overall transfer level: Needs assistance Equipment used: Rolling walker (2 wheeled) Transfers: Sit to/from [facility] Sit to Stand: Min assist Stand pivot transfers: Min assist       General transfer comment: min assist to rise from bed and BSC and for walker management with pt tending to sit before she is aligned with sitting surface. Pt with increased L plantarflexion during midstance  Ambulation/Gait             General Gait Details: deferred by patient  Stairs            Wheelchair Mobility    Modified Rankin  (Stroke Patients Only)       Balance Overall balance assessment: Needs assistance   Sitting balance-Leahy Scale: Fair     Standing balance support: Bilateral upper extremity supported Standing balance-Leahy Scale: Poor Standing balance comment: reliant on UE support and min assist                             Pertinent Vitals/Pain Pain Assessment: No/denies pain    Home Living Family/patient expects to be discharged to:: Private residence Living Arrangements: Spouse/significant other;Children (adult son) Available Help at Discharge: Family;Available PRN/intermittently Type of Home: House Home Access: Stairs to enter Entrance Stairs-Rails: Psychiatric nurse of Steps: 3 Home Layout: One level Home Equipment: Shower seat;Bedside commode;Walker - 4 wheels      Prior Function Level of Independence: Independent with assistive device(s)         Comments: prior to illness in June, pt used a rollator and was independent in self care, driving and grocery shopping some     Hand Dominance   Dominant Hand: Right    Extremity/Trunk Assessment   Upper Extremity Assessment Upper Extremity Assessment: Generalized weakness    Lower Extremity Assessment Lower Extremity Assessment: Generalized weakness    Cervical / Trunk Assessment Cervical / Trunk Assessment: Kyphotic  Communication   Communication: No difficulties  Cognition Arousal/Alertness: Awake/alert Behavior During Therapy: Flat affect;Agitated Overall Cognitive Status: Impaired/Different from baseline Area of Impairment: Attention;Memory;Following commands;Safety/judgement;Awareness;Problem solving;Orientation  Orientation Level: Time Current Attention Level: Sustained Memory: Decreased short-term memory Following Commands: Follows one step commands with increased time Safety/Judgement: Decreased awareness of safety;Decreased awareness of deficits Awareness:  Intellectual Problem Solving: Slow processing;Decreased initiation;Requires verbal cues;Requires tactile cues General Comments: pt focused on desire to eat and drink, currently NPO      General Comments      Exercises     Assessment/Plan    PT Assessment Patient needs continued PT services  PT Problem List Decreased strength;Decreased mobility;Decreased safety awareness;Decreased coordination;Decreased activity tolerance;Decreased cognition;Decreased balance;Decreased knowledge of use of DME       PT Treatment Interventions DME instruction;Therapeutic exercise;Gait training;Balance training;Functional mobility training;Cognitive remediation;Neuromuscular re-education;Therapeutic activities;Patient/family education    PT Goals (Current goals can be found in the Care Plan section)  Acute Rehab PT Goals Patient Stated Goal: eat and drink PT Goal Formulation: With patient Time For Goal Achievement: 06/01/20 Potential to Achieve Goals: Fair    Frequency Min 3X/week   Barriers to discharge        Co-evaluation               AM-PAC PT "6 Clicks" Mobility  Outcome Measure Help needed turning from your back to your side while in a flat bed without using bedrails?: None Help needed moving from lying on your back to sitting on the side of a flat bed without using bedrails?: A Little Help needed moving to and from a bed to a chair (including a wheelchair)?: A Little Help needed standing up from a chair using your arms (e.g., wheelchair or bedside chair)?: A Little Help needed to walk in hospital room?: A Little Help needed climbing 3-5 steps with a railing? : A Lot 6 Click Score: 18    End of Session   Activity Tolerance: Patient tolerated treatment well Patient left: in chair;with call bell/phone within reach;with chair alarm set Nurse Communication: Mobility status PT Visit Diagnosis: Other abnormalities of gait and mobility (R26.89);Difficulty in walking, not elsewhere  classified (R26.2);Muscle weakness (generalized) (M62.81);Other symptoms and signs involving the nervous system (R29.898);Pain Pain - Right/Left: Left Pain - part of body: Leg    Time: 5537-4827 PT Time Calculation (min) (ACUTE ONLY): 16 min   Charges:   PT Evaluation $PT Eval Moderate Complexity: 1 Mod            Wyona Almas, PT, DPT Acute Rehabilitation Services Pager [phone] Office [phone]   Deno Etienne 05/18/2020, 10:54 AM

## 2020-05-18 NOTE — Progress Notes (Signed)
Case discussed with Dr Starla Link.  Pt with bloody BM today.  GI will evaluate tomorrow morning.  Most likely this is progression of her ischemic colitis.  There is really no role for vascular intervention for this.  She does not have evidence of aortoenteric fistula on CT Scan.  Treatment for ischemic colitis is supportive unless pt refractory to this at which point she may need general surgery consult to consider colectomy.  I agree with dc aspirin and heparin.  Ruta Hinds, MD [facility] Office: [phone]

## 2020-05-18 NOTE — Progress Notes (Signed)
[name], female   DOB: 1951/03/16, 69 y.o.   MRN: [id number]   PROGRESS NOTE    Tonya Hughes  PZW:[id number] DOB: May 26, 1951 DOA: 05/17/2020 PCP: Tonya Blitz, MD   Brief Narrative:  69 year old female with history of PAD status post aortobifemoral bypass on 02/23/5360, chronic diastolic heart failure, hypertension, hyperlipidemia, MDD presented with abdominal pain, weakness and diarrhea and admitted per vascular surgery request due to concern for potential ischemic colitis.  In the ED, lipase was normal; C. difficile testing was positive for antigen, negative for toxin but positive for PCR.  CT of the abdomen and pelvis showed colitis of distal descending colon and rectosigmoid with improved inflammatory changes of the splenic flexure seen on prior CT; no pneumatosis or free air/portal venous gas.  She was started on IV antibiotics and oral vancomycin.  Assessment & Plan:   Possible ischemic colitis -Vascular surgery following.  Continue Rocephin and Flagyl.  Follow cultures.  Continue IV fluids. -will advance diet if okay with vascular surgery.  Currently n.p.o.  Pain management  Probable C. difficile colitis -C. difficile testing was positive for antigen but negative for toxin but PCR was positive.  Patient symptomatic with diarrhea. -Has been started on vancomycin orally which will be continued for 10 days.  Hyponatremia -Sodium 128 on presentation.  Improving to 132.  Continue IV fluids.  Repeat a.m. labs  Hypokalemia-replete.  Repeat a.m. labs  Acute kidney injury on chronic kidney disease stage probably 3a -Creatinine 1.3 on presentation.  Improving to 1.11.  Monitor  Anemia of chronic disease -Probably from chronic any disease and other current chronic illnesses.  Hemoglobin stable.  No signs of bleeding.  Monitor  Hypertension  -monitor blood pressure.  Antihypertensives including Lasix/lisinopril and Coreg on hold for now as blood pressure was low on  presentation.  Hyperlipidemia -Continue Lipitor  MDD -Continue Paxil  COPD -Stable.  Continue current regimen  Obesity -Outpatient follow-up  Generalized deconditioning -will need PT evaluation at some point    DVT prophylaxis: Heparin Code Status: Full Family Communication: Patient at bedside Disposition Plan: Status is: Inpatient  Remains inpatient appropriate because:Inpatient level of care appropriate due to severity of illness   Dispo: The patient is from: Home              Anticipated d/c is to: Home              Anticipated d/c date is: > 3 days              Patient currently is not medically stable to d/c.   Consultants: Vascular surgery  Procedures: None  Antimicrobials: Rocephin and IV Flagyl from 05/17/2020 onwards Oral vancomycin from 05/17/2020 onwards   Subjective: Patient seen and examined at bedside.  Does not feel well and still complains of abdominal pain with some nausea.  No overnight fever or vomiting reported.  Poor historian.  Objective: Vitals:   05/18/20 0130 05/18/20 0132 05/18/20 0332 05/18/20 0449  BP: (!) 127/54  (!) 137/57 (!) 136/55  Pulse:  (!) 58 61 62  Resp:  18 18 18   Temp:   98.7 F (37.1 C) 98.5 F (36.9 C)  TempSrc:   Oral Oral  SpO2:  94% 96% 97%   No intake or output data in the 24 hours ending 05/18/20 0956 There were no vitals filed for this visit.  Examination:  General exam: Looks chronically ill and older than stated age. Respiratory system: Bilateral decreased breath sounds at  bases with some scattered crackles Cardiovascular system: S1 & S2 heard, currently rate controlled rate controlled Gastrointestinal system: Abdomen is nondistended, soft and mild tenderness diffusely.  No rebound tenderness.  Normal bowel sounds heard. Extremities: No cyanosis, clubbing; trace lower extremity edema Central nervous system: Alert and oriented. No focal neurological deficits. Moving extremities Skin: No rashes, lesions  or ulcers Psychiatry: Looks very anxious.  Poor historian.  Does not communicate much.    Data Reviewed: I have personally reviewed following labs and imaging studies  CBC: Recent Labs  Lab 05/17/20 1518 05/18/20 0505  WBC 9.7 6.5  HGB 10.8* 9.2*  HCT 33.8* 28.0*  MCV 87.6 88.9  PLT 304 790   Basic Metabolic Panel: Recent Labs  Lab 05/17/20 1518 05/18/20 0505  NA 128* 132*  K 3.1* 3.3*  CL 86* 91*  CO2 26 29  GLUCOSE 123* 94  BUN 18 15  CREATININE 1.32* 1.11*  CALCIUM 8.4* 7.9*  MG  --  1.8  PHOS  --  4.5   GFR: Estimated Creatinine Clearance: 50.5 mL/min (A) (by C-G formula based on SCr of 1.11 mg/dL (H)). Liver Function Tests: Recent Labs  Lab 05/17/20 1518  AST 29  ALT 15  ALKPHOS 66  BILITOT 0.9  PROT 7.4  ALBUMIN 2.6*   Recent Labs  Lab 05/17/20 1518  LIPASE 28   No results for input(s): AMMONIA in the last 168 hours. Coagulation Profile: No results for input(s): INR, PROTIME in the last 168 hours. Cardiac Enzymes: No results for input(s): CKTOTAL, CKMB, CKMBINDEX, TROPONINI in the last 168 hours. BNP (last 3 results) No results for input(s): PROBNP in the last 8760 hours. HbA1C: No results for input(s): HGBA1C in the last 72 hours. CBG: Recent Labs  Lab 05/17/20 2353 05/18/20 0340 05/18/20 0753  GLUCAP 101* 97 89   Lipid Profile: No results for input(s): CHOL, HDL, LDLCALC, TRIG, CHOLHDL, LDLDIRECT in the last 72 hours. Thyroid Function Tests: No results for input(s): TSH, T4TOTAL, FREET4, T3FREE, THYROIDAB in the last 72 hours. Anemia Panel: No results for input(s): VITAMINB12, FOLATE, FERRITIN, TIBC, IRON, RETICCTPCT in the last 72 hours. Sepsis Labs: Recent Labs  Lab 05/18/20 0055 05/18/20 0505  LATICACIDVEN 1.1 0.9    Recent Results (from the past 240 hour(s))  C Difficile Quick Screen w PCR reflex     Status: Abnormal   Collection Time: 05/17/20  7:49 PM   Specimen: STOOL  Result Value Ref Range Status   C Diff antigen  POSITIVE (A) NEGATIVE Final   C Diff toxin NEGATIVE NEGATIVE Final   C Diff interpretation Results are indeterminate. See PCR results.  Final    Comment: Performed at [facility], [place] [address] [place], [place] [address]  C. Diff by PCR, Reflexed     Status: Abnormal   Collection Time: 05/17/20  7:49 PM  Result Value Ref Range Status   Toxigenic C. Difficile by PCR POSITIVE (A) NEGATIVE Final    Comment: Positive for toxigenic C. difficile with little to no toxin production. Only treat if clinical presentation suggests symptomatic illness. Performed at [facility], [place] [address] [place],  [address]   SARS Coronavirus 2 by RT PCR (hospital order, performed in [facility] hospital lab) Nasopharyngeal Nasopharyngeal Swab     Status: None   Collection Time: 05/18/20 12:16 AM   Specimen: Nasopharyngeal Swab  Result Value Ref Range Status   SARS Coronavirus 2 NEGATIVE NEGATIVE Final    Comment: (NOTE) SARS-CoV-2  target nucleic acids are NOT DETECTED.  The SARS-CoV-2 RNA is generally detectable in upper and lower respiratory specimens during the acute phase of infection. The lowest concentration of SARS-CoV-2 viral copies this assay can detect is 250 copies / mL. A negative result does not preclude SARS-CoV-2 infection and should not be used as the sole basis for treatment or other patient management decisions.  A negative result may occur with improper specimen collection / handling, submission of specimen other than nasopharyngeal swab, presence of viral mutation(s) within the areas targeted by this assay, and inadequate number of viral copies (<250 copies / mL). A negative result must be combined with clinical observations, patient history, and epidemiological information.  Fact Sheet for Patients:   StrictlyIdeas.no  Fact Sheet for Healthcare Providers: BankingDealers.co.za  This test is not yet approved or   cleared by the [place] FDA and has been authorized for detection and/or diagnosis of SARS-CoV-2 by FDA under an Emergency Use Authorization (EUA).  This EUA will remain in effect (meaning this test can be used) for the duration of the COVID-19 declaration under Section 564(b)(1) of the Act, 21 U.S.C. section 360bbb-3(b)(1), unless the authorization is terminated or revoked sooner.  Performed at [facility], [place] [address] [place], [place] [address]          Radiology Studies: CT Angio Abd/Pel W and/or Wo Contrast  Result Date: 05/17/2020 CLINICAL DATA:  69 year old female with generalized abdominal pain and decreased p.o. intake. Concern for ischemia. EXAM: CTA ABDOMEN AND PELVIS WITHOUT AND WITH CONTRAST TECHNIQUE: Multidetector CT imaging of the abdomen and pelvis was performed using the standard protocol during bolus administration of intravenous contrast. Multiplanar reconstructed images and MIPs were obtained and reviewed to evaluate the vascular anatomy. CONTRAST:  160m OMNIPAQUE IOHEXOL 350 MG/ML SOLN COMPARISON:  CT abdomen pelvis dated 04/20/2020. FINDINGS: VASCULAR Aorta: There is advanced atherosclerotic calcification. No aneurysmal dilatation or dissection. There is complete occlusion of the distal abdominal aorta. Postsurgical changes of an aortobifemoral bypass graft. Slight irregularity of the aortic and stem oasis, likely postsurgical. The bypass graft is patent to the level of the common femoral artery. There is stranding and fluid surrounding the aortic and stem oasis, likely postsurgical. No drainable fluid collection or abscess identified at this location. Celiac: There is atherosclerotic calcification of the origin of the celiac axis. The celiac artery and its major branches remain patent. SMA: Atherosclerotic calcification of the SMA. The SMA remains patent. Renals: The renal arteries are patent. 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 IMA: There is  nonvisualization of the origin of the IMA possibly due to compression by periaortic fluid and edema. There is reconstitution of flow within the IMA. Inflow: There is advanced atherosclerotic calcification of the iliac arteries. There is high-grade occlusion of the common iliac arteries bilaterally with very diminished flow in the native external and internal iliac arteries. The iliac limbs of the bypass graft are patent. Proximal Outflow: There is high-grade narrowing of the distal anastomosis of the left iliac limb of the bypass graft at the anastomosis with the common femoral artery. There is advanced atherosclerotic calcification of the proximal portion of the common femoral, superficial and deep femoral arteries. These arteries appear narrowed with diminished flow but remain patent. There is atherosclerotic calcification and luminal narrowing of the right common femoral artery and visualized proximal superficial and deep femoral arteries. These arteries however remain patent. Veins: The IVC is unremarkable. The SMV, splenic vein, and main portal vein  are patent. No portal venous gas. Review of the MIP images confirms the above findings. NON-VASCULAR Lower chest: The visualized lung bases are clear. There is multi vessel coronary vascular calcification. No intra-abdominal free air or free fluid. Hepatobiliary: The liver is unremarkable. No intrahepatic biliary ductal dilatation. Cholecystectomy. No retained calcified stone noted in the central CBD. Pancreas: Unremarkable. No pancreatic ductal dilatation or surrounding inflammatory changes. Spleen: Normal in size without focal abnormality. Adrenals/Urinary Tract: The adrenal glands are unremarkable. There is mild right hydronephrosis similar to prior CT. There is no hydronephrosis on the left. Evaluation of the ureters is limited due to streak artifact caused by spinal hardware. The urinary bladder is unremarkable. Stomach/Bowel: There is diffuse circumferential  thickening of the distal descending colon and rectosigmoid consistent with colitis. Interval improvement in the inflammatory changes of the splenic flexure since the prior CT. No pneumatosis. No definite evidence of perforation. No bowel obstruction. There is a 5 cm outpouching of the proximal stomach, likely a gastric diverticula. Lymphatic: No adenopathy. Reproductive: Hysterectomy. Other: Somewhat loculated fluid collections in the groins bilaterally adjacent to the distal bypass graft anastomosis most consistent with seroma. Developing infection is not excluded. These fluid collections have increased in size since the prior CT and measure approximately 5.5 x 1.5 cm on the left and 4.0 x 2.2 cm on the right. Musculoskeletal: Osteopenia with degenerative changes of the spine and severe postsurgical changes and posterior fusion. No acute osseous pathology. IMPRESSION: 1. Colitis of the distal descending colon and rectosigmoid with improved inflammatory changes of the splenic flexure seen on the prior CT. No pneumatosis. No free air or portal venous gas. 2. Occluded distal native aorta and an aorto bi femoral bypass graft. The graft is patent. 3. Atherosclerotic calcification and narrowing of the bilateral femoral arteries. 4. Loculated appearing fluid collections adjacent to the femoral and sternum oasis of the bypass graft, increased in size since the prior CT. These may represent seroma although an infectious process is not excluded. 5. Mild right hydronephrosis similar to prior CT. 6. A 5 cm gastric diverticula. 7. Aortic Atherosclerosis (ICD10-I70.0). Electronically Signed   By: Anner Crete M.D.   On: 05/17/2020 21:15        Scheduled Meds: . albuterol  2.5 mg Nebulization BID  . arformoterol  15 mcg Nebulization BID  . aspirin EC  81 mg Oral Daily  . atorvastatin  40 mg Oral QHS  . heparin  5,000 Units Subcutaneous Q8H  . insulin aspart  0-9 Units Subcutaneous Q4H  . loratadine  10 mg Oral  Daily  . PARoxetine  20 mg Oral Daily  . potassium chloride  40 mEq Oral Q4H  . sodium chloride flush  10-40 mL Intracatheter Q12H  . vancomycin  125 mg Oral QID   Continuous Infusions: . sodium chloride 75 mL/hr at 05/18/20 0832  . cefTRIAXone (ROCEPHIN)  IV Stopped (05/18/20 0214)  . metronidazole 500 mg (05/18/20 0848)          Aline August, MD [facility] 05/18/2020, 9:56 AM

## 2020-05-18 NOTE — Evaluation (Signed)
Occupational Therapy Evaluation Patient Details Name: Tonya Hughes MRN: [id number] DOB: 11-13-1950 Today's Date: 05/18/2020    History of Present Illness Pt is a 69 year old woman admitted one day after d/c from SNF with abdominal pain and diarrhea, hyponatremia and AKI. Pt discharged from [facility] on [facility] after admission for aortobifemoral BPG with complication of signmoid ischemia. PMH: CHF, COPD, MDD, HTN, HLD.     Clinical Impression   At her baseline, pt ambulates with a rollator and completes ADL, drives and does some grocery shopping. She presents with generalized weakness, decreased activity tolerance and impaired standing balance. She requires set up to max assist for ADL. Pt highly focused and at times agitated about NPO status.  Will follow acutely. Recommending home with HHOT and 24 hour care of her family.    Follow Up Recommendations  Home health OT;Supervision/Assistance - 24 hour    Equipment Recommendations  None recommended by OT    Recommendations for Other Services       Precautions / Restrictions Precautions Precautions: Fall      Mobility Bed Mobility Overal bed mobility: Needs Assistance Bed Mobility: Supine to Sit     Supine to sit: Min assist;HOB elevated     General bed mobility comments: assist to raise trunk  Transfers Overall transfer level: Needs assistance Equipment used: Rolling walker (2 wheeled) Transfers: Sit to/from [facility] Sit to Stand: Min assist Stand pivot transfers: Min assist       General transfer comment: min assist to rise from bed and BSC and for walker management with pt tending to sit before she is aligned with sitting surface    Balance Overall balance assessment: Needs assistance   Sitting balance-Leahy Scale: Fair     Standing balance support: Bilateral upper extremity supported Standing balance-Leahy Scale: Poor Standing balance comment: reliant on UE support and min assist                            ADL either performed or assessed with clinical judgement   ADL Overall ADL's : Needs assistance/impaired Eating/Feeding: NPO   Grooming: Wash/dry face;Sitting;Set up   Upper Body Bathing: Moderate assistance;Sitting   Lower Body Bathing: Maximal assistance;Sit to/from stand   Upper Body Dressing : Maximal assistance;Sitting   Lower Body Dressing: Maximal assistance;Sit to/from stand   Toilet Transfer: Minimal assistance;Stand-pivot;BSC;RW   Toileting- Clothing Manipulation and Hygiene: Total assistance;Sit to/from stand       Functional mobility during ADLs: Minimal assistance;Rolling walker;+2 for safety/equipment General ADL Comments: Pt in soiled bed, unaware, had not called for assistance. Wanted to return to bed immediately upon getting to chair.     Vision Patient Visual Report: No change from baseline       Perception     Praxis      Pertinent Vitals/Pain Pain Assessment: No/denies pain     Hand Dominance Right   Extremity/Trunk Assessment Upper Extremity Assessment Upper Extremity Assessment: Generalized weakness   Lower Extremity Assessment Lower Extremity Assessment: Defer to PT evaluation   Cervical / Trunk Assessment Cervical / Trunk Assessment: Kyphotic   Communication Communication Communication: No difficulties   Cognition Arousal/Alertness: Awake/alert Behavior During Therapy: Flat affect;Agitated Overall Cognitive Status: Impaired/Different from baseline Area of Impairment: Attention;Memory;Following commands;Safety/judgement;Awareness;Problem solving;Orientation                 Orientation Level: Time Current Attention Level: Sustained Memory: Decreased short-term memory Following Commands: Follows one step commands  with increased time Safety/Judgement: Decreased awareness of safety;Decreased awareness of deficits Awareness: Intellectual Problem Solving: Slow processing;Decreased initiation;Requires verbal  cues;Requires tactile cues General Comments: pt focused on desire to eat and drink, currently NPO   General Comments       Exercises     Shoulder Instructions      Home Living Family/patient expects to be discharged to:: Private residence Living Arrangements: Spouse/significant other;Children (adult son) Available Help at Discharge: Family;Available PRN/intermittently Type of Home: House Home Access: Stairs to enter [employer] of Steps: 3 Entrance Stairs-Rails: Right;Left Home Layout: One level     Bathroom Shower/Tub: Occupational psychologist: Standard   How Accessible: Accessible via walker Home Equipment: Shower seat;Bedside commode;Walker - 4 wheels      Lives With: Spouse;Son    Prior Functioning/Environment Level of Independence: Independent with assistive device(s)        Comments: prior to illness in June, pt used a rollator and was independent in self care, driving and grocery shopping some        OT Problem List: Decreased strength;Decreased activity tolerance;Impaired balance (sitting and/or standing);Decreased cognition;Decreased safety awareness;Decreased knowledge of use of DME or AE;Decreased knowledge of precautions;Pain      OT Treatment/Interventions: Self-care/ADL training;DME and/or AE instruction;Therapeutic activities;Cognitive remediation/compensation;Patient/family education;Balance training    OT Goals(Current goals can be found in the care plan section) Acute Rehab OT Goals Patient Stated Goal: eat and drink OT Goal Formulation: With patient Time For Goal Achievement: 06/01/20 Potential to Achieve Goals: Good ADL Goals Pt Will Perform Grooming: with min guard assist;standing Pt Will Perform Upper Body Bathing: with set-up;sitting Pt Will Perform Lower Body Bathing: with min guard assist;sit to/from stand Pt Will Perform Upper Body Dressing: with set-up;sitting Pt Will Perform Lower Body Dressing: with min guard  assist;sit to/from stand Pt Will Transfer to Toilet: with min guard assist;ambulating;bedside commode (over toilet) Pt Will Perform Toileting - Clothing Manipulation and hygiene: with min guard assist;sit to/from stand  OT Frequency: Min 2X/week   Barriers to D/C:            Co-evaluation              AM-PAC OT "6 Clicks" Daily Activity     Outcome Measure Help from another person eating meals?: None Help from another person taking care of personal grooming?: A Little Help from another person toileting, which includes using toliet, bedpan, or urinal?: A Lot Help from another person bathing (including washing, rinsing, drying)?: A Lot Help from another person to put on and taking off regular upper body clothing?: A Little Help from another person to put on and taking off regular lower body clothing?: A Lot 6 Click Score: 16   End of Session Equipment Utilized During Treatment: Gait belt;Rolling walker  Activity Tolerance: Patient limited by fatigue;Treatment limited secondary to agitation Patient left: in chair;with call bell/phone within reach;with chair alarm set  OT Visit Diagnosis: Other abnormalities of gait and mobility (R26.89);Muscle weakness (generalized) (M62.81);Other symptoms and signs involving cognitive function                Time: 0300-9233 OT Time Calculation (min): 17 min Charges:  OT General Charges $OT Visit: 1 Visit OT Evaluation $OT Eval Moderate Complexity: 1 Mod  Nestor Lewandowsky, OTR/L Acute Rehabilitation Services Pager: [phone] Office: [phone]  Tonya Hughes 05/18/2020, 10:22 AM

## 2020-05-19 DIAGNOSIS — F329 Major depressive disorder, single episode, unspecified: Secondary | ICD-10-CM | POA: Diagnosis not present

## 2020-05-19 DIAGNOSIS — A0472 Enterocolitis due to Clostridium difficile, not specified as recurrent: Secondary | ICD-10-CM | POA: Diagnosis not present

## 2020-05-19 DIAGNOSIS — K559 Vascular disorder of intestine, unspecified: Secondary | ICD-10-CM | POA: Diagnosis not present

## 2020-05-19 DIAGNOSIS — K625 Hemorrhage of anus and rectum: Secondary | ICD-10-CM | POA: Diagnosis not present

## 2020-05-19 LAB — CBC
HCT: 19.6 % — ABNORMAL LOW (ref 36.0–46.0)
Hemoglobin: 6.2 g/dL — CL (ref 12.0–15.0)
MCH: 28.7 pg (ref 26.0–34.0)
MCHC: 31.6 g/dL (ref 30.0–36.0)
MCV: 90.7 fL (ref 80.0–100.0)
Platelets: 215 10*3/uL (ref 150–400)
RBC: 2.16 MIL/uL — ABNORMAL LOW (ref 3.87–5.11)
RDW: 16.6 % — ABNORMAL HIGH (ref 11.5–15.5)
WBC: 6 10*3/uL (ref 4.0–10.5)
nRBC: 0 % (ref 0.0–0.2)

## 2020-05-19 LAB — BASIC METABOLIC PANEL
Anion gap: 10 (ref 5–15)
BUN: 13 mg/dL (ref 8–23)
CO2: 26 mmol/L (ref 22–32)
Calcium: 7.7 mg/dL — ABNORMAL LOW (ref 8.9–10.3)
Chloride: 97 mmol/L — ABNORMAL LOW (ref 98–111)
Creatinine, Ser: 1.02 mg/dL — ABNORMAL HIGH (ref 0.44–1.00)
GFR calc Af Amer: >60 mL/min (ref >60)
GFR calc non Af Amer: 57 mL/min — ABNORMAL LOW (ref >60)
Glucose, Bld: 115 mg/dL — ABNORMAL HIGH (ref 70–99)
Potassium: 4.2 mmol/L (ref 3.5–5.1)
Sodium: 133 mmol/L — ABNORMAL LOW (ref 135–145)

## 2020-05-19 LAB — C-REACTIVE PROTEIN: CRP: 4.2 mg/dL — ABNORMAL HIGH (ref <1.0)

## 2020-05-19 LAB — GLUCOSE, CAPILLARY
Glucose-Capillary: 100 mg/dL — ABNORMAL HIGH (ref 70–99)
Glucose-Capillary: 107 mg/dL — ABNORMAL HIGH (ref 70–99)
Glucose-Capillary: 111 mg/dL — ABNORMAL HIGH (ref 70–99)
Glucose-Capillary: 111 mg/dL — ABNORMAL HIGH (ref 70–99)
Glucose-Capillary: 123 mg/dL — ABNORMAL HIGH (ref 70–99)
Glucose-Capillary: 94 mg/dL (ref 70–99)

## 2020-05-19 LAB — HEMOGLOBIN AND HEMATOCRIT, BLOOD
HCT: 25.5 % — ABNORMAL LOW (ref 36.0–46.0)
Hemoglobin: 8.6 g/dL — ABNORMAL LOW (ref 12.0–15.0)

## 2020-05-19 LAB — MAGNESIUM: Magnesium: 1.7 mg/dL (ref 1.7–2.4)

## 2020-05-19 MED ORDER — SODIUM CHLORIDE 0.9% IV SOLUTION: Freq: Once | INTRAVENOUS | Status: DC

## 2020-05-19 NOTE — Progress Notes (Signed)
Patient ID: Tonya Hughes, female   DOB: 1950-12-10, 69 y.o.   MRN: [id number]   PROGRESS NOTE    Tonya Hughes  ERD:[id number] DOB: 1951-09-18 DOA: 05/17/2020 PCP: Monico Blitz, MD   Brief Narrative:  69 year old female with history of PAD status post aortobifemoral bypass on 5/63/1497, chronic diastolic heart failure, hypertension, hyperlipidemia, MDD presented with abdominal pain, weakness and diarrhea and admitted per vascular surgery request due to concern for potential ischemic colitis.  In the ED, lipase was normal; C. difficile testing was positive for antigen, negative for toxin but positive for PCR.  CT of the abdomen and pelvis showed colitis of distal descending colon and rectosigmoid with improved inflammatory changes of the splenic flexure seen on prior CT; no pneumatosis or free air/portal venous gas.  She was started on IV antibiotics and oral vancomycin.  Assessment & Plan:   Possible ischemic colitis -Vascular surgery following.  Continue Rocephin and Flagyl.   -Pain management.  N.p.o. for now.  Rectal bleeding Acute blood loss anemia on top of anemia of chronic disease -Patient started to have rectal bleeding from 05/18/2020 onwards. -Hemoglobin has dropped to 6.2 this morning.  Transfuse 2 unit of packed red cells.  Aspirin and prophylactic heparin were discontinued on 05/18/2020.  Spoke with Dr. Carlean Purl and patient will be evaluated by Dr. Cristina Gong as he had done the flexible sigmoidoscopy last month.  Patient might need flexible sigmoidoscopy again.  Probable C. difficile colitis -C. difficile testing was positive for antigen but negative for toxin but PCR was positive.  Patient symptomatic with diarrhea. -Has been started on vancomycin orally which will be continued for 10 days.  Hyponatremia -Sodium 128 on presentation.  Improving to 133.  Treated with IV fluids.  Repeat a.m. labs  Hypokalemia-improved.  Repeat a.m. labs  Acute kidney injury on chronic kidney disease  stage probably 3a -Creatinine 1.3 on presentation.  Improving to 1.02.  Monitor  Hypertension  -monitor blood pressure.  Antihypertensives including Lasix/lisinopril and Coreg on hold for now as blood pressure was low on presentation.  Hyperlipidemia -Continue Lipitor  MDD -Continue Paxil  COPD -Stable.  Continue current regimen  Obesity -Outpatient follow-up  Generalized deconditioning -will need PT evaluation at some point    DVT prophylaxis: SCDs.  Heparin discontinued on 05/18/2020 Code Status: Full Family Communication: Patient at bedside Disposition Plan: Status is: Inpatient  Remains inpatient appropriate because:Inpatient level of care appropriate due to severity of illness   Dispo: The patient is from: Home              Anticipated d/c is to: Home              Anticipated d/c date is: > 3 days              Patient currently is not medically stable to d/c.   Consultants: Vascular surgery  Procedures: None  Antimicrobials: Rocephin and IV Flagyl from 05/17/2020 onwards Oral vancomycin from 05/17/2020 onwards   Subjective: Patient seen and examined at bedside.  Poor historian.  Still complains of intermittent abdominal pain.  Does not elaborate on symptoms.  Patient started to have rectal bleeding since yesterday and nursing staff reported that she was passing clots.  No overnight fever or vomiting reported.  No rectal bleeding this morning as per nursing staff. Objective: Vitals:   05/18/20 1725 05/18/20 2015 05/19/20 0210 05/19/20 0423  BP: (!) 130/50 124/78 (!) 98/52 (!) 125/46  Pulse: 78 67 81 75  Resp: 16  17 17 17   Temp: 98.3 F (36.8 C) 98.1 F (36.7 C) 97.7 F (36.5 C) 98.3 F (36.8 C)  TempSrc: Oral Oral Oral Oral  SpO2: 97% 98% 95% 100%  Weight:    73.4 kg    Intake/Output Summary (Last 24 hours) at 05/19/2020 0817 Last data filed at 05/18/2020 1847 Gross per 24 hour  Intake 1090.26 ml  Output --  Net 1090.26 ml   Filed Weights    05/19/20 0423  Weight: 73.4 kg    Examination:  General exam: Chronically ill looking female.  Looks older than stated age.  Poor historian.  Intermittently gets angry.  No distress. Respiratory system: Bilateral decreased breath sounds at bases with some crackles.  No wheezing  cardiovascular system: Rate controlled, S1-S2 heard Gastrointestinal system: Abdomen is slightly distended, soft and diffusely tender.  No rebound tenderness.  Normal bowel sounds heard. Extremities: Bilateral trace lower extremity palpation.  No clubbing Central nervous system: Awake and alert.  No focal neurological deficits. Moving extremities Skin: No obvious petechiae/lesions Psychiatry: Poor historian.  Anxious.  Does not communicate much.  Intermittently gets angry.    Data Reviewed: I have personally reviewed following labs and imaging studies  CBC: Recent Labs  Lab 05/17/20 1518 05/18/20 0505 05/18/20 1633 05/18/20 1937 05/19/20 0608  WBC 9.7 6.5  --   --  6.0  HGB 10.8* 9.2* 8.6* 8.0* 6.2*  HCT 33.8* 28.0* 27.5* 25.1* 19.6*  MCV 87.6 88.9  --   --  90.7  PLT 304 240  --   --  638   Basic Metabolic Panel: Recent Labs  Lab 05/17/20 1518 05/18/20 0505 05/19/20 0608  NA 128* 132* 133*  K 3.1* 3.3* 4.2  CL 86* 91* 97*  CO2 26 29 26   GLUCOSE 123* 94 115*  BUN 18 15 13   CREATININE 1.32* 1.11* 1.02*  CALCIUM 8.4* 7.9* 7.7*  MG  --  1.8 1.7  PHOS  --  4.5  --    GFR: Estimated Creatinine Clearance: 51.4 mL/min (A) (by C-G formula based on SCr of 1.02 mg/dL (H)). Liver Function Tests: Recent Labs  Lab 05/17/20 1518  AST 29  ALT 15  ALKPHOS 66  BILITOT 0.9  PROT 7.4  ALBUMIN 2.6*   Recent Labs  Lab 05/17/20 1518  LIPASE 28   No results for input(s): AMMONIA in the last 168 hours. Coagulation Profile: No results for input(s): INR, PROTIME in the last 168 hours. Cardiac Enzymes: No results for input(s): CKTOTAL, CKMB, CKMBINDEX, TROPONINI in the last 168 hours. BNP (last 3  results) No results for input(s): PROBNP in the last 8760 hours. HbA1C: No results for input(s): HGBA1C in the last 72 hours. CBG: Recent Labs  Lab 05/18/20 1131 05/18/20 1723 05/19/20 0012 05/19/20 0422 05/19/20 0728  GLUCAP 83 155* 123* 111* 111*   Lipid Profile: No results for input(s): CHOL, HDL, LDLCALC, TRIG, CHOLHDL, LDLDIRECT in the last 72 hours. Thyroid Function Tests: No results for input(s): TSH, T4TOTAL, FREET4, T3FREE, THYROIDAB in the last 72 hours. Anemia Panel: No results for input(s): VITAMINB12, FOLATE, FERRITIN, TIBC, IRON, RETICCTPCT in the last 72 hours. Sepsis Labs: Recent Labs  Lab 05/18/20 0055 05/18/20 0505  LATICACIDVEN 1.1 0.9    Recent Results (from the past 240 hour(s))  C Difficile Quick Screen w PCR reflex     Status: Abnormal   Collection Time: 05/17/20  7:49 PM   Specimen: STOOL  Result Value Ref Range Status   C Diff antigen POSITIVE (  A) NEGATIVE Final   C Diff toxin NEGATIVE NEGATIVE Final   C Diff interpretation Results are indeterminate. See PCR results.  Final    Comment: Performed at [facility], [place] [address] [place], [place] [address]  C. Diff by PCR, Reflexed     Status: Abnormal   Collection Time: 05/17/20  7:49 PM  Result Value Ref Range Status   Toxigenic C. Difficile by PCR POSITIVE (A) NEGATIVE Final    Comment: Positive for toxigenic C. difficile with little to no toxin production. Only treat if clinical presentation suggests symptomatic illness. Performed at [facility], [place] [address] [place], [place] [address]   SARS Coronavirus 2 by RT PCR (hospital order, performed in [facility] hospital lab) Nasopharyngeal Nasopharyngeal Swab     Status: None   Collection Time: 05/18/20 12:16 AM   Specimen: Nasopharyngeal Swab  Result Value Ref Range Status   SARS Coronavirus 2 NEGATIVE NEGATIVE Final    Comment: (NOTE) SARS-CoV-2 target nucleic acids are NOT DETECTED.  The SARS-CoV-2 RNA is generally  detectable in upper and lower respiratory specimens during the acute phase of infection. The lowest concentration of SARS-CoV-2 viral copies this assay can detect is 250 copies / mL. A negative result does not preclude SARS-CoV-2 infection and should not be used as the sole basis for treatment or other patient management decisions.  A negative result may occur with improper specimen collection / handling, submission of specimen other than nasopharyngeal swab, presence of viral mutation(s) within the areas targeted by this assay, and inadequate number of viral copies (<250 copies / mL). A negative result must be combined with clinical observations, patient history, and epidemiological information.  Fact Sheet for Patients:   StrictlyIdeas.no  Fact Sheet for Healthcare Providers: BankingDealers.co.za  This test is not yet approved or  cleared by the [place] FDA and has been authorized for detection and/or diagnosis of SARS-CoV-2 by FDA under an Emergency Use Authorization (EUA).  This EUA will remain in effect (meaning this test can be used) for the duration of the COVID-19 declaration under Section 564(b)(1) of the Act, 21 U.S.C. section 360bbb-3(b)(1), unless the authorization is terminated or revoked sooner.  Performed at [facility], [place] [address] [place],  [address]          Radiology Studies: CT Angio Abd/Pel W and/or Wo Contrast  Result Date: 05/17/2020 CLINICAL DATA:  69 year old female with generalized abdominal pain and decreased p.o. intake. Concern for ischemia. EXAM: CTA ABDOMEN AND PELVIS WITHOUT AND WITH CONTRAST TECHNIQUE: Multidetector CT imaging of the abdomen and pelvis was performed using the standard protocol during bolus administration of intravenous contrast. Multiplanar reconstructed images and MIPs were obtained and reviewed to evaluate the vascular anatomy. CONTRAST:  165m OMNIPAQUE  IOHEXOL 350 MG/ML SOLN COMPARISON:  CT abdomen pelvis dated 04/20/2020. FINDINGS: VASCULAR Aorta: There is advanced atherosclerotic calcification. No aneurysmal dilatation or dissection. There is complete occlusion of the distal abdominal aorta. Postsurgical changes of an aortobifemoral bypass graft. Slight irregularity of the aortic and stem oasis, likely postsurgical. The bypass graft is patent to the level of the common femoral artery. There is stranding and fluid surrounding the aortic and stem oasis, likely postsurgical. No drainable fluid collection or abscess identified at this location. Celiac: There is atherosclerotic calcification of the origin of the celiac axis. The celiac artery and its major branches remain patent. SMA: Atherosclerotic calcification of the SMA. The SMA remains patent. Renals: The renal arteries are  patent. 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 IMA: There is nonvisualization of the origin of the IMA possibly due to compression by periaortic fluid and edema. There is reconstitution of flow within the IMA. Inflow: There is advanced atherosclerotic calcification of the iliac arteries. There is high-grade occlusion of the common iliac arteries bilaterally with very diminished flow in the native external and internal iliac arteries. The iliac limbs of the bypass graft are patent. Proximal Outflow: There is high-grade narrowing of the distal anastomosis of the left iliac limb of the bypass graft at the anastomosis with the common femoral artery. There is advanced atherosclerotic calcification of the proximal portion of the common femoral, superficial and deep femoral arteries. These arteries appear narrowed with diminished flow but remain patent. There is atherosclerotic calcification and luminal narrowing of the right common femoral artery and visualized proximal superficial and deep femoral arteries. These arteries however remain patent. Veins: The IVC is unremarkable. The SMV,  splenic vein, and main portal vein are patent. No portal venous gas. Review of the MIP images confirms the above findings. NON-VASCULAR Lower chest: The visualized lung bases are clear. There is multi vessel coronary vascular calcification. No intra-abdominal free air or free fluid. Hepatobiliary: The liver is unremarkable. No intrahepatic biliary ductal dilatation. Cholecystectomy. No retained calcified stone noted in the central CBD. Pancreas: Unremarkable. No pancreatic ductal dilatation or surrounding inflammatory changes. Spleen: Normal in size without focal abnormality. Adrenals/Urinary Tract: The adrenal glands are unremarkable. There is mild right hydronephrosis similar to prior CT. There is no hydronephrosis on the left. Evaluation of the ureters is limited due to streak artifact caused by spinal hardware. The urinary bladder is unremarkable. Stomach/Bowel: There is diffuse circumferential thickening of the distal descending colon and rectosigmoid consistent with colitis. Interval improvement in the inflammatory changes of the splenic flexure since the prior CT. No pneumatosis. No definite evidence of perforation. No bowel obstruction. There is a 5 cm outpouching of the proximal stomach, likely a gastric diverticula. Lymphatic: No adenopathy. Reproductive: Hysterectomy. Other: Somewhat loculated fluid collections in the groins bilaterally adjacent to the distal bypass graft anastomosis most consistent with seroma. Developing infection is not excluded. These fluid collections have increased in size since the prior CT and measure approximately 5.5 x 1.5 cm on the left and 4.0 x 2.2 cm on the right. Musculoskeletal: Osteopenia with degenerative changes of the spine and severe postsurgical changes and posterior fusion. No acute osseous pathology. IMPRESSION: 1. Colitis of the distal descending colon and rectosigmoid with improved inflammatory changes of the splenic flexure seen on the prior CT. No pneumatosis.  No free air or portal venous gas. 2. Occluded distal native aorta and an aorto bi femoral bypass graft. The graft is patent. 3. Atherosclerotic calcification and narrowing of the bilateral femoral arteries. 4. Loculated appearing fluid collections adjacent to the femoral and sternum oasis of the bypass graft, increased in size since the prior CT. These may represent seroma although an infectious process is not excluded. 5. Mild right hydronephrosis similar to prior CT. 6. A 5 cm gastric diverticula. 7. Aortic Atherosclerosis (ICD10-I70.0). Electronically Signed   By: Anner Crete M.D.   On: 05/17/2020 21:15        Scheduled Meds: . sodium chloride   Intravenous Once  . albuterol  2.5 mg Nebulization BID  . arformoterol  15 mcg Nebulization BID  . atorvastatin  40 mg Oral QHS  . insulin aspart  0-9 Units Subcutaneous Q4H  . loratadine  10  mg Oral Daily  . PARoxetine  20 mg Oral Daily  . sodium chloride flush  10-40 mL Intracatheter Q12H  . vancomycin  125 mg Oral QID   Continuous Infusions: . cefTRIAXone (ROCEPHIN)  IV 2 g (05/18/20 2201)  . metronidazole 500 mg (05/18/20 5176)          Aline August, MD [facility] 05/19/2020, 8:17 AM

## 2020-05-19 NOTE — Progress Notes (Signed)
[facility]  Subjective  - everything hurts   Objective (!) 118/43 69 98.1 F (36.7 C) (Oral) 17 99%  Intake/Output Summary (Last 24 hours) at 05/19/2020 1014 Last data filed at 05/19/2020 0947 Gross per 24 hour  Intake 1330.26 ml  Output --  Net 1330.26 ml   abdpmen obese soft mild diffuse tenderness  Assessment/Planning: S/p aortobifem POD 31 now with C diff, possible ischemic colitis.  GI to see today  Some maceration of groin incisions but no drainage.  Still has staples.  Dr Donzetta Matters to review tomorrow   Ruta Hinds 05/19/2020 10:14 AM --  Laboratory Lab Results: Recent Labs    05/18/20 0505 05/18/20 1633 05/18/20 1937 05/19/20 0608  WBC 6.5  --   --  6.0  HGB 9.2*   < > 8.0* 6.2*  HCT 28.0*   < > 25.1* 19.6*  PLT 240  --   --  215   < > = values in this interval not displayed.   BMET Recent Labs    05/18/20 0505 05/19/20 0608  NA 132* 133*  K 3.3* 4.2  CL 91* 97*  CO2 29 26  GLUCOSE 94 115*  BUN 15 13  CREATININE 1.11* 1.02*  CALCIUM 7.9* 7.7*    COAG Lab Results  Component Value Date   INR 1.5 (H) 04/18/2020   INR 1.1 04/15/2020   No results found for: PTT

## 2020-05-19 NOTE — H&P (View-Only) (Signed)
Referring Provider:  Dr. Starla Link Primary Care Physician:  Monico Blitz, MD Primary Gastroenterologist: None (unassigned)  Reason for Consultation: Colitis  HPI: Tonya Hughes is a 69 y.o. female whom we were asked to see because of colitis, characterized by diarrhea and rectal bleeding.  Tonya Hughes is known to me from the time of sigmoidoscopic evaluation exactly a month ago, when she was postop from an aortobifem bypass graft operation, and developed evidence of colonic ischemia.  Sigmoidoscopy at that time showed extremely severe ischemic necrosis of the rectal and distal colonic mucosa (more proximal advancement of the scope was not performed out of concern for patient safety).  However, no gangrene was evident, and it was elected to manage the patient nonoperatively and, fortunately, she made a good recovery from that episode of colitis.    Although her diarrhea really has not resolved in the ensuing month since her surgery, the bleeding had resolved until a couple of days ago when bleeding recurred and she was admitted through the emergency room 2 days ago.  C. difficile toxin is negative (although she is apparently colonized because the antigen is present).  However, her nurse indicates that today, her stool is brown in color, without further bleeding.  The patient endorses diffuse abdominal discomfort, but at the same time, indicates that she is hungry.  Her white count remains normal, but as of today, there is a 3 g drop in hemoglobin compared to yesterday; some of this could be equilibration following hydration but it appears the patient's baseline hemoglobin at discharge 3 weeks ago was 8.6, so she is still at least 2 g lower than that baseline..  A CT scan obtained 2 days ago shows left-sided colitis.   Past Medical History:  Diagnosis Date   Arthritis    HANDS   Back pain    Congestive heart failure (CHF) (HCC)    Depressive disorder    Diabetes mellitus without complication  ([place])    new, A1c 6.9% 04/2020   DVT (deep venous thrombosis) ([place])    Right LE s/p back surgery stop date lovenox 09/05/17   Dyspnea    Gastritis    GERD (gastroesophageal reflux disease)    Hypercholesteremia    Hypertension    Pneumonia     Past Surgical History:  Procedure Laterality Date   ABDOMINAL AORTOGRAM W/LOWER EXTREMITY Bilateral 11/13/2019   Procedure: ABDOMINAL AORTOGRAM W/LOWER EXTREMITY;  Surgeon: Waynetta Sandy, MD;  Location: [facility];  Service: Cardiovascular;  Laterality: Bilateral;   ABDOMINAL HYSTERECTOMY     AORTA - BILATERAL FEMORAL ARTERY BYPASS GRAFT N/A 04/18/2020   Procedure: AORTA BIFEMORAL BYPASS;  Surgeon: Waynetta Sandy, MD;  Location: [facility];  Service: Vascular;  Laterality: N/A;   AORTIC ENDARTERECETOMY N/A 04/18/2020   Procedure: abdominal AORTIC ENDARTERECETOMY;  Surgeon: Waynetta Sandy, MD;  Location: [facility];  Service: Vascular;  Laterality: N/A;   APPENDECTOMY     BACK SURGERY     CERVICAL SPINE SURGERY     CHOLECYSTECTOMY     COLONOSCOPY N/A 04/19/2020   Procedure: COLONOSCOPY;  Surgeon: Ronald Lobo, MD;  Location: [facility];  Service: Endoscopy;  Laterality: N/A;  bedside exam, pt on ventilator   DIAGNOSTIC LAPAROSCOPY     lap. chole   ENDARTERECTOMY FEMORAL Right 04/18/2020   Procedure: RIGHT FEMORAL ENDARTERECTOMY;  Surgeon: Waynetta Sandy, MD;  Location: [facility];  Service: Vascular;  Laterality: Right;   LUMBAR SPINE SURGERY      Prior  to Admission medications   Medication Sig Start Date End Date Taking? Authorizing Provider  aspirin EC 81 MG tablet Take 81 mg by mouth daily.   Yes [provider]  atorvastatin (LIPITOR) 40 MG tablet Take 1 tablet (40 mg total) by mouth daily. Patient taking differently: Take 40 mg by mouth at bedtime.  03/25/20 06/23/20 Yes Herminio Commons, MD  carvedilol (COREG) 3.125 MG tablet TAKE ONE TABLET BY MOUTH TWICE  DAILY Patient taking differently: Take 3.125 mg by mouth 2 (two) times daily.  09/21/19  Yes Herminio Commons, MD  fexofenadine (ALLEGRA) 180 MG tablet Take 180 mg by mouth daily.   Yes [provider]  furosemide (LASIX) 40 MG tablet Take 40 mg by mouth daily.   Yes [provider]  ibuprofen (ADVIL) 200 MG tablet Take 600 mg by mouth every 6 (six) hours as needed for headache or mild pain.   Yes [provider]  lisinopril (PRINIVIL,ZESTRIL) 5 MG tablet Take 5 mg by mouth daily.   Yes [provider]  PARoxetine (PAXIL) 20 MG tablet Take 20 mg by mouth daily.   Yes [provider]  potassium chloride SA (K-DUR,KLOR-CON) 20 MEQ tablet TAKE ONE (1) TABLET EACH DAY Patient taking differently: Take 20 mEq by mouth daily.  01/03/19  Yes Herminio Commons, MD  albuterol (PROVENTIL) (2.5 MG/3ML) 0.083% nebulizer solution Take 3 mLs (2.5 mg total) by nebulization 2 (two) times daily. 04/29/20   Rhyne, Hulen Shouts, PA-C  arformoterol (BROVANA) 15 MCG/2ML NEBU Take 2 mLs (15 mcg total) by nebulization 2 (two) times daily. 04/29/20   Rhyne, Hulen Shouts, PA-C  HYDROcodone-acetaminophen (NORCO/VICODIN) 5-325 MG tablet Take 1 tablet by mouth every 6 (six) hours as needed for moderate pain. Patient not taking: Reported on 05/17/2020 04/29/20   Gabriel Earing, PA-C    Current Facility-Administered Medications  Medication Dose Route Frequency Provider Last Rate Last Admin   0.9 %  sodium chloride infusion (Manually program via Guardrails IV Fluids)   Intravenous Once Aline August, MD       acetaminophen (TYLENOL) tablet 650 mg  650 mg Oral Q6H PRN Fair, Chelsea N, MD       albuterol (PROVENTIL) (2.5 MG/3ML) 0.083% nebulizer solution 2.5 mg  2.5 mg Nebulization BID Fair, Marin Shutter, MD       arformoterol (BROVANA) nebulizer solution 15 mcg  15 mcg Nebulization BID Fair, Marin Shutter, MD       atorvastatin (LIPITOR) tablet 40 mg  40 mg Oral QHS Fair, Marin Shutter,  MD   40 mg at 05/18/20 2207   cefTRIAXone (ROCEPHIN) 2 g in sodium chloride 0.9 % 100 mL IVPB  2 g Intravenous Q24H Fair, Chelsea N, MD 200 mL/hr at 05/18/20 2201 2 g at 05/18/20 2201   fentaNYL (SUBLIMAZE) injection 50-100 mcg  50-100 mcg Intravenous Q2H PRN Fair, Marin Shutter, MD       insulin aspart (novoLOG) injection 0-9 Units  0-9 Units Subcutaneous Q4H Fair, Chelsea N, MD       loratadine (CLARITIN) tablet 10 mg  10 mg Oral Daily Fair, Marin Shutter, MD   10 mg at 05/19/20 1136   metroNIDAZOLE (FLAGYL) IVPB 500 mg  500 mg Intravenous Q8H Alekh, Kshitiz, MD 100 mL/hr at 05/19/20 0910 500 mg at 05/19/20 0910   ondansetron (ZOFRAN) injection 4 mg  4 mg Intravenous Q6H PRN Fair, Marin Shutter, MD       PARoxetine (PAXIL) tablet 20 mg  20 mg  Oral Daily Fair, Marin Shutter, MD   20 mg at 05/18/20 1103   sodium chloride flush (NS) 0.9 % injection 10-40 mL  10-40 mL Intracatheter Q12H Fair, Marin Shutter, MD   10 mL at 05/18/20 0132   sodium chloride flush (NS) 0.9 % injection 10-40 mL  10-40 mL Intracatheter PRN Fair, Marin Shutter, MD       vancomycin (VANCOCIN) 50 mg/mL oral solution 125 mg  125 mg Oral QID Fair, Chelsea N, MD   125 mg at 05/19/20 1137    Allergies as of 05/17/2020 - Review Complete 05/17/2020  Allergen Reaction Noted   Zanaflex [tizanidine hcl] Hives and Swelling 12/22/2017   Penicillins Itching and Rash 04/20/2013    Family History  Problem Relation Age of Onset   Hypertension Father     Social History   Socioeconomic History   Marital status: Married    Spouse name: Not on file   Number of children: Not on file   Years of education: Not on file   Highest education level: Not on file  Occupational History   Not on file  Tobacco Use   Smoking status: Former Smoker    Packs/day: 1.00    Years: 40.00    Pack years: 40.00    Types: Cigarettes   Smokeless tobacco: Never Used  Scientific laboratory technician Use: Never used  Substance and Sexual Activity   Alcohol use:  Not Currently   Drug use: Never   Sexual activity: Not on file  Other Topics Concern   Not on file  Social History Narrative   Not on file   Social Determinants of Health   Financial Resource Strain:    Difficulty of Paying Living Expenses:   Food Insecurity:    Worried About Charity fundraiser in the Last Year:    Arboriculturist in the Last Year:   Transportation Needs:    Film/video editor (Medical):    Lack of Transportation (Non-Medical):   Physical Activity:    Days of Exercise per Week:    Minutes of Exercise per Session:   Stress:    Feeling of Stress :   Social Connections:    Frequency of Communication with Friends and Family:    Frequency of Social Gatherings with Friends and Family:    Attends Religious Services:    Active Member of Clubs or Organizations:    Attends Archivist Meetings:    Marital Status:   Intimate Partner Violence:    Fear of Current or Ex-Partner:    Emotionally Abused:    Physically Abused:    Sexually Abused:     Review of Systems:   Physical Exam: Vital signs in last 24 hours: Temp:  [97.5 F (36.4 C)-98.3 F (36.8 C)] 97.9 F (36.6 C) (07/18 1455) Pulse Rate:  [60-81] 60 (07/18 1455) Resp:  [16-17] 16 (07/18 1455) BP: (94-132)/(38-78) 132/43 (07/18 1455) SpO2:  [95 %-100 %] 97 % (07/18 1455) Weight:  [73.4 kg] 73.4 kg (07/18 0423) Last BM Date: 05/19/20  This is a somewhat overweight, chronically ill-appearing female in no acute distress, in fact, rather somnolent in the bed, but readily arousable and coherent.  She is anicteric and without frank pallor.  Chest is clear anteriorly, heart normal.  Abdomen has bowel sounds and is soft, although somewhat "touchy" in the lower abdominal region without definite objective tenderness.  Intake/Output from previous day: 07/17 0701 - 07/18 0700 In: 1090.3 [P.O.:120; I.V.:768.8; IV  Piggyback:201.5] Out: -  Intake/Output this shift: Total  I/O In: 869 [P.O.:240; Blood:322.8; IV Piggyback:306.2] Out: -   Lab Results: Recent Labs    05/17/20 1518 05/17/20 1518 05/18/20 0505 05/18/20 0505 05/18/20 1633 05/18/20 1937 05/19/20 0608  WBC 9.7  --  6.5  --   --   --  6.0  HGB 10.8*   < > 9.2*   < > 8.6* 8.0* 6.2*  HCT 33.8*   < > 28.0*   < > 27.5* 25.1* 19.6*  PLT 304  --  240  --   --   --  215   < > = values in this interval not displayed.   BMET Recent Labs    05/17/20 1518 05/18/20 0505 05/19/20 0608  NA 128* 132* 133*  K 3.1* 3.3* 4.2  CL 86* 91* 97*  CO2 26 29 26   GLUCOSE 123* 94 115*  BUN 18 15 13   CREATININE 1.32* 1.11* 1.02*  CALCIUM 8.4* 7.9* 7.7*   LFT Recent Labs    05/17/20 1518  PROT 7.4  ALBUMIN 2.6*  AST 29  ALT 15  ALKPHOS 66  BILITOT 0.9   PT/INR No results for input(s): LABPROT, INR in the last 72 hours.  Studies/Results: CT Angio Abd/Pel W and/or Wo Contrast  Result Date: 05/17/2020 CLINICAL DATA:  69 year old female with generalized abdominal pain and decreased p.o. intake. Concern for ischemia. EXAM: CTA ABDOMEN AND PELVIS WITHOUT AND WITH CONTRAST TECHNIQUE: Multidetector CT imaging of the abdomen and pelvis was performed using the standard protocol during bolus administration of intravenous contrast. Multiplanar reconstructed images and MIPs were obtained and reviewed to evaluate the vascular anatomy. CONTRAST:  170m OMNIPAQUE IOHEXOL 350 MG/ML SOLN COMPARISON:  CT abdomen pelvis dated 04/20/2020. FINDINGS: VASCULAR Aorta: There is advanced atherosclerotic calcification. No aneurysmal dilatation or dissection. There is complete occlusion of the distal abdominal aorta. Postsurgical changes of an aortobifemoral bypass graft. Slight irregularity of the aortic and stem oasis, likely postsurgical. The bypass graft is patent to the level of the common femoral artery. There is stranding and fluid surrounding the aortic and stem oasis, likely postsurgical. No drainable fluid collection or  abscess identified at this location. Celiac: There is atherosclerotic calcification of the origin of the celiac axis. The celiac artery and its major branches remain patent. SMA: Atherosclerotic calcification of the SMA. The SMA remains patent. Renals: The renal arteries are patent. 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 IMA: There is nonvisualization of the origin of the IMA possibly due to compression by periaortic fluid and edema. There is reconstitution of flow within the IMA. Inflow: There is advanced atherosclerotic calcification of the iliac arteries. There is high-grade occlusion of the common iliac arteries bilaterally with very diminished flow in the native external and internal iliac arteries. The iliac limbs of the bypass graft are patent. Proximal Outflow: There is high-grade narrowing of the distal anastomosis of the left iliac limb of the bypass graft at the anastomosis with the common femoral artery. There is advanced atherosclerotic calcification of the proximal portion of the common femoral, superficial and deep femoral arteries. These arteries appear narrowed with diminished flow but remain patent. There is atherosclerotic calcification and luminal narrowing of the right common femoral artery and visualized proximal superficial and deep femoral arteries. These arteries however remain patent. Veins: The IVC is unremarkable. The SMV, splenic vein, and main portal vein are patent. No portal venous gas. Review of the MIP images confirms the above findings. NON-VASCULAR Lower  chest: The visualized lung bases are clear. There is multi vessel coronary vascular calcification. No intra-abdominal free air or free fluid. Hepatobiliary: The liver is unremarkable. No intrahepatic biliary ductal dilatation. Cholecystectomy. No retained calcified stone noted in the central CBD. Pancreas: Unremarkable. No pancreatic ductal dilatation or surrounding inflammatory changes. Spleen: Normal in size without  focal abnormality. Adrenals/Urinary Tract: The adrenal glands are unremarkable. There is mild right hydronephrosis similar to prior CT. There is no hydronephrosis on the left. Evaluation of the ureters is limited due to streak artifact caused by spinal hardware. The urinary bladder is unremarkable. Stomach/Bowel: There is diffuse circumferential thickening of the distal descending colon and rectosigmoid consistent with colitis. Interval improvement in the inflammatory changes of the splenic flexure since the prior CT. No pneumatosis. No definite evidence of perforation. No bowel obstruction. There is a 5 cm outpouching of the proximal stomach, likely a gastric diverticula. Lymphatic: No adenopathy. Reproductive: Hysterectomy. Other: Somewhat loculated fluid collections in the groins bilaterally adjacent to the distal bypass graft anastomosis most consistent with seroma. Developing infection is not excluded. These fluid collections have increased in size since the prior CT and measure approximately 5.5 x 1.5 cm on the left and 4.0 x 2.2 cm on the right. Musculoskeletal: Osteopenia with degenerative changes of the spine and severe postsurgical changes and posterior fusion. No acute osseous pathology. IMPRESSION: 1. Colitis of the distal descending colon and rectosigmoid with improved inflammatory changes of the splenic flexure seen on the prior CT. No pneumatosis. No free air or portal venous gas. 2. Occluded distal native aorta and an aorto bi femoral bypass graft. The graft is patent. 3. Atherosclerotic calcification and narrowing of the bilateral femoral arteries. 4. Loculated appearing fluid collections adjacent to the femoral and sternum oasis of the bypass graft, increased in size since the prior CT. These may represent seroma although an infectious process is not excluded. 5. Mild right hydronephrosis similar to prior CT. 6. A 5 cm gastric diverticula. 7. Aortic Atherosclerosis (ICD10-I70.0). Electronically  Signed   By: Anner Crete M.D.   On: 05/17/2020 21:15    Impression: 1.  Recent hematochezia with 3 g drop in hemoglobin (posthemorrhagic anemia) 2.  Previous and current diarrhea 3.  Left-sided colitis by recent CT 4.  History of extremely severe ischemic colitis postoperatively following aortobifem bypass a month ago 5.  Severe vascular disease  Discussion:   I am encouraged by the fact she is hungry, and that her white count is normal, and her abdominal exam quite benign.  This makes it less likely that she has frank gangrene or severe colitis going on.  Plan: Unprepped sigmoidoscopy/colonoscopy tomorrow to assess the colonic mucosa.  Given her diarrhea, I think the exam will be feasible without doing a prep, since we do not need to have a completely clean colon to be able to assess the mucosa.  The risks of the procedure were reviewed with the patient and she is agreeable.  We were unable to do the exam today because she ate breakfast, but given the absence of further hematochezia, and the normal white count, the urgency of the exam is not as great.    LOS: 2 days   Tonya Hughes  05/19/2020, 4:36 PM   Pager [phone] If no answer or after 5 PM call [phone]

## 2020-05-19 NOTE — Consult Note (Signed)
Referring Provider:  Dr. Starla Link Primary Care Physician:  Monico Blitz, MD Primary Gastroenterologist: None (unassigned)  Reason for Consultation: Colitis  HPI: Tonya Hughes is a 69 y.o. female whom we were asked to see because of colitis, characterized by diarrhea and rectal bleeding.  Tonya Hughes is known to me from the time of sigmoidoscopic evaluation exactly a month ago, when she was postop from an aortobifem bypass graft operation, and developed evidence of colonic ischemia.  Sigmoidoscopy at that time showed extremely severe ischemic necrosis of the rectal and distal colonic mucosa (more proximal advancement of the scope was not performed out of concern for patient safety).  However, no gangrene was evident, and it was elected to manage the patient nonoperatively and, fortunately, she made a good recovery from that episode of colitis.    Although her diarrhea really has not resolved in the ensuing month since her surgery, the bleeding had resolved until a couple of days ago when bleeding recurred and she was admitted through the emergency room 2 days ago.  C. difficile toxin is negative (although she is apparently colonized because the antigen is present).  However, her nurse indicates that today, her stool is brown in color, without further bleeding.  The patient endorses diffuse abdominal discomfort, but at the same time, indicates that she is hungry.  Her white count remains normal, but as of today, there is a 3 g drop in hemoglobin compared to yesterday; some of this could be equilibration following hydration but it appears the patient's baseline hemoglobin at discharge 3 weeks ago was 8.6, so she is still at least 2 g lower than that baseline..  A CT scan obtained 2 days ago shows left-sided colitis.   Past Medical History:  Diagnosis Date  . Arthritis    HANDS  . Back pain   . Congestive heart failure (CHF) ([place])   . Depressive disorder   . Diabetes mellitus without complication  ([place])    new, A1c 6.9% 04/2020  . DVT (deep venous thrombosis) ([place])    Right LE s/p back surgery stop date lovenox 09/05/17  . Dyspnea   . Gastritis   . GERD (gastroesophageal reflux disease)   . Hypercholesteremia   . Hypertension   . Pneumonia     Past Surgical History:  Procedure Laterality Date  . ABDOMINAL AORTOGRAM W/LOWER EXTREMITY Bilateral 11/13/2019   Procedure: ABDOMINAL AORTOGRAM W/LOWER EXTREMITY;  Surgeon: Waynetta Sandy, MD;  Location: [facility];  Service: Cardiovascular;  Laterality: Bilateral;  . ABDOMINAL HYSTERECTOMY    . AORTA - BILATERAL FEMORAL ARTERY BYPASS GRAFT N/A 04/18/2020   Procedure: AORTA BIFEMORAL BYPASS;  Surgeon: Waynetta Sandy, MD;  Location: [facility] OR;  Service: Vascular;  Laterality: N/A;  . AORTIC ENDARTERECETOMY N/A 04/18/2020   Procedure: abdominal AORTIC ENDARTERECETOMY;  Surgeon: Waynetta Sandy, MD;  Location: [place] [facility];  Service: Vascular;  Laterality: N/A;  . APPENDECTOMY    . BACK SURGERY    . CERVICAL SPINE SURGERY    . CHOLECYSTECTOMY    . COLONOSCOPY N/A 04/19/2020   Procedure: COLONOSCOPY;  Surgeon: Ronald Lobo, MD;  Location[facility] ENDOSCOPY;  Service: Endoscopy;  Laterality: N/A;  bedside exam, pt on ventilator  . DIAGNOSTIC LAPAROSCOPY     lap. chole  . ENDARTERECTOMY FEMORAL Right 04/18/2020   Procedure: RIGHT FEMORAL ENDARTERECTOMY;  Surgeon: Waynetta Sandy, MD;  Location: [place];  Service: Vascular;  Laterality: Right;  . LUMBAR SPINE SURGERY      Prior  to Admission medications   Medication Sig Start Date End Date Taking? Authorizing Provider  aspirin EC 81 MG tablet Take 81 mg by mouth daily.   Yes [provider]  atorvastatin (LIPITOR) 40 MG tablet Take 1 tablet (40 mg total) by mouth daily. Patient taking differently: Take 40 mg by mouth at bedtime.  03/25/20 06/23/20 Yes Herminio Commons, MD  carvedilol (COREG) 3.125 MG tablet TAKE ONE TABLET BY MOUTH TWICE  DAILY Patient taking differently: Take 3.125 mg by mouth 2 (two) times daily.  09/21/19  Yes Herminio Commons, MD  fexofenadine (ALLEGRA) 180 MG tablet Take 180 mg by mouth daily.   Yes [provider]  furosemide (LASIX) 40 MG tablet Take 40 mg by mouth daily.   Yes [provider]  ibuprofen (ADVIL) 200 MG tablet Take 600 mg by mouth every 6 (six) hours as needed for headache or mild pain.   Yes [provider]  lisinopril (PRINIVIL,ZESTRIL) 5 MG tablet Take 5 mg by mouth daily.   Yes [provider]  PARoxetine (PAXIL) 20 MG tablet Take 20 mg by mouth daily.   Yes [provider]  potassium chloride SA (K-DUR,KLOR-CON) 20 MEQ tablet TAKE ONE (1) TABLET EACH DAY Patient taking differently: Take 20 mEq by mouth daily.  01/03/19  Yes Herminio Commons, MD  albuterol (PROVENTIL) (2.5 MG/3ML) 0.083% nebulizer solution Take 3 mLs (2.5 mg total) by nebulization 2 (two) times daily. 04/29/20   Rhyne, Hulen Shouts, PA-C  arformoterol (BROVANA) 15 MCG/2ML NEBU Take 2 mLs (15 mcg total) by nebulization 2 (two) times daily. 04/29/20   Rhyne, Hulen Shouts, PA-C  HYDROcodone-acetaminophen (NORCO/VICODIN) 5-325 MG tablet Take 1 tablet by mouth every 6 (six) hours as needed for moderate pain. Patient not taking: Reported on 05/17/2020 04/29/20   Gabriel Earing, PA-C    Current Facility-Administered Medications  Medication Dose Route Frequency Provider Last Rate Last Admin  . 0.9 %  sodium chloride infusion (Manually program via Guardrails IV Fluids)   Intravenous Once Aline August, MD      . acetaminophen (TYLENOL) tablet 650 mg  650 mg Oral Q6H PRN Fair, Chelsea N, MD      . albuterol (PROVENTIL) (2.5 MG/3ML) 0.083% nebulizer solution 2.5 mg  2.5 mg Nebulization BID Fair, Chelsea N, MD      . arformoterol (BROVANA) nebulizer solution 15 mcg  15 mcg Nebulization BID Fair, Chelsea N, MD      . atorvastatin (LIPITOR) tablet 40 mg  40 mg Oral QHS Chauncey Mann,  MD   40 mg at 05/18/20 2207  . cefTRIAXone (ROCEPHIN) 2 g in sodium chloride 0.9 % 100 mL IVPB  2 g Intravenous Q24H Fair, Marin Shutter, MD 200 mL/hr at 05/18/20 2201 2 g at 05/18/20 2201  . fentaNYL (SUBLIMAZE) injection 50-100 mcg  50-100 mcg Intravenous Q2H PRN Fair, Chelsea N, MD      . insulin aspart (novoLOG) injection 0-9 Units  0-9 Units Subcutaneous Q4H Fair, Chelsea N, MD      . loratadine (CLARITIN) tablet 10 mg  10 mg Oral Daily Fair, Marin Shutter, MD   10 mg at 05/19/20 1136  . metroNIDAZOLE (FLAGYL) IVPB 500 mg  500 mg Intravenous Q8H Alekh, Kshitiz, MD 100 mL/hr at 05/19/20 0910 500 mg at 05/19/20 0910  . ondansetron (ZOFRAN) injection 4 mg  4 mg Intravenous Q6H PRN Fair, Chelsea N, MD      . PARoxetine (PAXIL) tablet 20 mg  20 mg  Oral Daily Chauncey Mann, MD   20 mg at 05/18/20 1103  . sodium chloride flush (NS) 0.9 % injection 10-40 mL  10-40 mL Intracatheter Q12H Fair, Marin Shutter, MD   10 mL at 05/18/20 0132  . sodium chloride flush (NS) 0.9 % injection 10-40 mL  10-40 mL Intracatheter PRN Fair, Marin Shutter, MD      . vancomycin (VANCOCIN) 50 mg/mL oral solution 125 mg  125 mg Oral QID Chauncey Mann, MD   125 mg at 05/19/20 1137    Allergies as of 05/17/2020 - Review Complete 05/17/2020  Allergen Reaction Noted  . Zanaflex [tizanidine hcl] Hives and Swelling 12/22/2017  . Penicillins Itching and Rash 04/20/2013    Family History  Problem Relation Age of Onset  . Hypertension Father     Social History   Socioeconomic History  . Marital status: Married    Spouse name: Not on file  . Number of children: Not on file  . Years of education: Not on file  . Highest education level: Not on file  Occupational History  . Not on file  Tobacco Use  . Smoking status: Former Smoker    Packs/day: 1.00    Years: 40.00    Pack years: 40.00    Types: Cigarettes  . Smokeless tobacco: Never Used  Vaping Use  . Vaping Use: Never used  Substance and Sexual Activity  . Alcohol use:  Not Currently  . Drug use: Never  . Sexual activity: Not on file  Other Topics Concern  . Not on file  Social History Narrative  . Not on file   Social Determinants of Health   Financial Resource Strain:   . Difficulty of Paying Living Expenses:   Food Insecurity:   . Worried About Charity fundraiser in the Last Year:   . Arboriculturist in the Last Year:   Transportation Needs:   . Film/video editor (Medical):   Marland Kitchen Lack of Transportation (Non-Medical):   Physical Activity:   . Days of Exercise per Week:   . Minutes of Exercise per Session:   Stress:   . Feeling of Stress :   Social Connections:   . Frequency of Communication with Friends and Family:   . Frequency of Social Gatherings with Friends and Family:   . Attends Religious Services:   . Active Member of Clubs or Organizations:   . Attends Archivist Meetings:   Marland Kitchen Marital Status:   Intimate Partner Violence:   . Fear of Current or Ex-Partner:   . Emotionally Abused:   Marland Kitchen Physically Abused:   . Sexually Abused:     Review of Systems:   Physical Exam: Vital signs in last 24 hours: Temp:  [97.5 F (36.4 C)-98.3 F (36.8 C)] 97.9 F (36.6 C) (07/18 1455) Pulse Rate:  [60-81] 60 (07/18 1455) Resp:  [16-17] 16 (07/18 1455) BP: (94-132)/(38-78) 132/43 (07/18 1455) SpO2:  [95 %-100 %] 97 % (07/18 1455) Weight:  [73.4 kg] 73.4 kg (07/18 0423) Last BM Date: 05/19/20  This is a somewhat overweight, chronically ill-appearing female in no acute distress, in fact, rather somnolent in the bed, but readily arousable and coherent.  She is anicteric and without frank pallor.  Chest is clear anteriorly, heart normal.  Abdomen has bowel sounds and is soft, although somewhat "touchy" in the lower abdominal region without definite objective tenderness.  Intake/Output from previous day: 07/17 0701 - 07/18 0700 In: 1090.3 [P.O.:120; I.V.:768.8; IV  Piggyback:201.5] Out: -  Intake/Output this shift: Total  I/O In: 869 [P.O.:240; Blood:322.8; IV Piggyback:306.2] Out: -   Lab Results: Recent Labs    05/17/20 1518 05/17/20 1518 05/18/20 0505 05/18/20 0505 05/18/20 1633 05/18/20 1937 05/19/20 0608  WBC 9.7  --  6.5  --   --   --  6.0  HGB 10.8*   < > 9.2*   < > 8.6* 8.0* 6.2*  HCT 33.8*   < > 28.0*   < > 27.5* 25.1* 19.6*  PLT 304  --  240  --   --   --  215   < > = values in this interval not displayed.   BMET Recent Labs    05/17/20 1518 05/18/20 0505 05/19/20 0608  NA 128* 132* 133*  K 3.1* 3.3* 4.2  CL 86* 91* 97*  CO2 26 29 26   GLUCOSE 123* 94 115*  BUN 18 15 13   CREATININE 1.32* 1.11* 1.02*  CALCIUM 8.4* 7.9* 7.7*   LFT Recent Labs    05/17/20 1518  PROT 7.4  ALBUMIN 2.6*  AST 29  ALT 15  ALKPHOS 66  BILITOT 0.9   PT/INR No results for input(s): LABPROT, INR in the last 72 hours.  Studies/Results: CT Angio Abd/Pel W and/or Wo Contrast  Result Date: 05/17/2020 CLINICAL DATA:  69 year old female with generalized abdominal pain and decreased p.o. intake. Concern for ischemia. EXAM: CTA ABDOMEN AND PELVIS WITHOUT AND WITH CONTRAST TECHNIQUE: Multidetector CT imaging of the abdomen and pelvis was performed using the standard protocol during bolus administration of intravenous contrast. Multiplanar reconstructed images and MIPs were obtained and reviewed to evaluate the vascular anatomy. CONTRAST:  162m OMNIPAQUE IOHEXOL 350 MG/ML SOLN COMPARISON:  CT abdomen pelvis dated 04/20/2020. FINDINGS: VASCULAR Aorta: There is advanced atherosclerotic calcification. No aneurysmal dilatation or dissection. There is complete occlusion of the distal abdominal aorta. Postsurgical changes of an aortobifemoral bypass graft. Slight irregularity of the aortic and stem oasis, likely postsurgical. The bypass graft is patent to the level of the common femoral artery. There is stranding and fluid surrounding the aortic and stem oasis, likely postsurgical. No drainable fluid collection or  abscess identified at this location. Celiac: There is atherosclerotic calcification of the origin of the celiac axis. The celiac artery and its major branches remain patent. SMA: Atherosclerotic calcification of the SMA. The SMA remains patent. Renals: The renal arteries are patent. 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 IMA: There is nonvisualization of the origin of the IMA possibly due to compression by periaortic fluid and edema. There is reconstitution of flow within the IMA. Inflow: There is advanced atherosclerotic calcification of the iliac arteries. There is high-grade occlusion of the common iliac arteries bilaterally with very diminished flow in the native external and internal iliac arteries. The iliac limbs of the bypass graft are patent. Proximal Outflow: There is high-grade narrowing of the distal anastomosis of the left iliac limb of the bypass graft at the anastomosis with the common femoral artery. There is advanced atherosclerotic calcification of the proximal portion of the common femoral, superficial and deep femoral arteries. These arteries appear narrowed with diminished flow but remain patent. There is atherosclerotic calcification and luminal narrowing of the right common femoral artery and visualized proximal superficial and deep femoral arteries. These arteries however remain patent. Veins: The IVC is unremarkable. The SMV, splenic vein, and main portal vein are patent. No portal venous gas. Review of the MIP images confirms the above findings. NON-VASCULAR Lower  chest: The visualized lung bases are clear. There is multi vessel coronary vascular calcification. No intra-abdominal free air or free fluid. Hepatobiliary: The liver is unremarkable. No intrahepatic biliary ductal dilatation. Cholecystectomy. No retained calcified stone noted in the central CBD. Pancreas: Unremarkable. No pancreatic ductal dilatation or surrounding inflammatory changes. Spleen: Normal in size without  focal abnormality. Adrenals/Urinary Tract: The adrenal glands are unremarkable. There is mild right hydronephrosis similar to prior CT. There is no hydronephrosis on the left. Evaluation of the ureters is limited due to streak artifact caused by spinal hardware. The urinary bladder is unremarkable. Stomach/Bowel: There is diffuse circumferential thickening of the distal descending colon and rectosigmoid consistent with colitis. Interval improvement in the inflammatory changes of the splenic flexure since the prior CT. No pneumatosis. No definite evidence of perforation. No bowel obstruction. There is a 5 cm outpouching of the proximal stomach, likely a gastric diverticula. Lymphatic: No adenopathy. Reproductive: Hysterectomy. Other: Somewhat loculated fluid collections in the groins bilaterally adjacent to the distal bypass graft anastomosis most consistent with seroma. Developing infection is not excluded. These fluid collections have increased in size since the prior CT and measure approximately 5.5 x 1.5 cm on the left and 4.0 x 2.2 cm on the right. Musculoskeletal: Osteopenia with degenerative changes of the spine and severe postsurgical changes and posterior fusion. No acute osseous pathology. IMPRESSION: 1. Colitis of the distal descending colon and rectosigmoid with improved inflammatory changes of the splenic flexure seen on the prior CT. No pneumatosis. No free air or portal venous gas. 2. Occluded distal native aorta and an aorto bi femoral bypass graft. The graft is patent. 3. Atherosclerotic calcification and narrowing of the bilateral femoral arteries. 4. Loculated appearing fluid collections adjacent to the femoral and sternum oasis of the bypass graft, increased in size since the prior CT. These may represent seroma although an infectious process is not excluded. 5. Mild right hydronephrosis similar to prior CT. 6. A 5 cm gastric diverticula. 7. Aortic Atherosclerosis (ICD10-I70.0). Electronically  Signed   By: Anner Crete M.D.   On: 05/17/2020 21:15    Impression: 1.  Recent hematochezia with 3 g drop in hemoglobin (posthemorrhagic anemia) 2.  Previous and current diarrhea 3.  Left-sided colitis by recent CT 4.  History of extremely severe ischemic colitis postoperatively following aortobifem bypass a month ago 5.  Severe vascular disease  Discussion:   I am encouraged by the fact she is hungry, and that her white count is normal, and her abdominal exam quite benign.  This makes it less likely that she has frank gangrene or severe colitis going on.  Plan: Unprepped sigmoidoscopy/colonoscopy tomorrow to assess the colonic mucosa.  Given her diarrhea, I think the exam will be feasible without doing a prep, since we do not need to have a completely clean colon to be able to assess the mucosa.  The risks of the procedure were reviewed with the patient and she is agreeable.  We were unable to do the exam today because she ate breakfast, but given the absence of further hematochezia, and the normal white count, the urgency of the exam is not as great.    LOS: 2 days   Youlanda Mighty Naitik Hermann  05/19/2020, 4:36 PM   Pager [phone] If no answer or after 5 PM call [phone]

## 2020-05-19 NOTE — Progress Notes (Signed)
Dr. Starla Link notified of alert hgb 6.2

## 2020-05-20 ENCOUNTER — Encounter (HOSPITAL_COMMUNITY)
Admission: EM | Disposition: A | Discharge status: Home / Self Care | Payer: Self-pay | Source: Home / Self Care | Attending: Family Medicine

## 2020-05-20 ENCOUNTER — Inpatient Hospital Stay (HOSPITAL_COMMUNITY): Payer: Medicare Other | Admitting: Anesthesiology

## 2020-05-20 ENCOUNTER — Encounter (HOSPITAL_COMMUNITY): Payer: Self-pay | Admitting: Family Medicine

## 2020-05-20 DIAGNOSIS — A0472 Enterocolitis due to Clostridium difficile, not specified as recurrent: Secondary | ICD-10-CM | POA: Diagnosis not present

## 2020-05-20 DIAGNOSIS — K559 Vascular disorder of intestine, unspecified: Secondary | ICD-10-CM | POA: Diagnosis not present

## 2020-05-20 DIAGNOSIS — I10 Essential (primary) hypertension: Secondary | ICD-10-CM | POA: Diagnosis not present

## 2020-05-20 DIAGNOSIS — F329 Major depressive disorder, single episode, unspecified: Secondary | ICD-10-CM | POA: Diagnosis not present

## 2020-05-20 HISTORY — PX: FLEXIBLE SIGMOIDOSCOPY: SHX5431

## 2020-05-20 HISTORY — PX: BIOPSY: SHX5522

## 2020-05-20 LAB — BASIC METABOLIC PANEL
Anion gap: 8 (ref 5–15)
BUN: 8 mg/dL (ref 8–23)
CO2: 26 mmol/L (ref 22–32)
Calcium: 7.7 mg/dL — ABNORMAL LOW (ref 8.9–10.3)
Chloride: 100 mmol/L (ref 98–111)
Creatinine, Ser: 0.8 mg/dL (ref 0.44–1.00)
GFR calc Af Amer: >60 mL/min (ref >60)
GFR calc non Af Amer: >60 mL/min (ref >60)
Glucose, Bld: 94 mg/dL (ref 70–99)
Potassium: 3.9 mmol/L (ref 3.5–5.1)
Sodium: 134 mmol/L — ABNORMAL LOW (ref 135–145)

## 2020-05-20 LAB — GLUCOSE, CAPILLARY
Glucose-Capillary: 108 mg/dL — ABNORMAL HIGH (ref 70–99)
Glucose-Capillary: 84 mg/dL (ref 70–99)
Glucose-Capillary: 91 mg/dL (ref 70–99)
Glucose-Capillary: 97 mg/dL (ref 70–99)
Glucose-Capillary: 98 mg/dL (ref 70–99)

## 2020-05-20 LAB — CBC
HCT: 26.9 % — ABNORMAL LOW (ref 36.0–46.0)
Hemoglobin: 9.1 g/dL — ABNORMAL LOW (ref 12.0–15.0)
MCH: 30.3 pg (ref 26.0–34.0)
MCHC: 33.8 g/dL (ref 30.0–36.0)
MCV: 89.7 fL (ref 80.0–100.0)
Platelets: 235 10*3/uL (ref 150–400)
RBC: 3 MIL/uL — ABNORMAL LOW (ref 3.87–5.11)
RDW: 16 % — ABNORMAL HIGH (ref 11.5–15.5)
WBC: 6.3 10*3/uL (ref 4.0–10.5)
nRBC: 0 % (ref 0.0–0.2)

## 2020-05-20 SURGERY — SIGMOIDOSCOPY, FLEXIBLE
Anesthesia: Monitor Anesthesia Care

## 2020-05-20 MED ORDER — LIDOCAINE HCL (CARDIAC) PF 100 MG/5ML IV SOSY
PREFILLED_SYRINGE | INTRAVENOUS | Status: DC | PRN
Start: 2020-05-20 — End: 2020-05-20
  Administered 2020-05-20: 50 mg via INTRATRACHEAL

## 2020-05-20 MED ORDER — LACTATED RINGERS IV SOLN: INTRAVENOUS | Status: DC | PRN

## 2020-05-20 MED ORDER — PROPOFOL 500 MG/50ML IV EMUL
INTRAVENOUS | Status: DC | PRN
  Administered 2020-05-20: 125 ug/kg/min via INTRAVENOUS

## 2020-05-20 MED ORDER — SODIUM CHLORIDE 0.9 % IV SOLN: INTRAVENOUS | Status: DC

## 2020-05-20 MED ORDER — ALBUTEROL SULFATE (2.5 MG/3ML) 0.083% IN NEBU
2.5000 mg | INHALATION_SOLUTION | Freq: Two times a day (BID) | RESPIRATORY_TRACT | Status: DC
  Filled 2020-05-20: qty 3

## 2020-05-20 MED ORDER — LACTATED RINGERS IV SOLN: INTRAVENOUS | Status: DC

## 2020-05-20 SURGICAL SUPPLY — 22 items

## 2020-05-20 NOTE — Anesthesia Postprocedure Evaluation (Signed)
Anesthesia Post Note  Patient: Tonya Hughes  Procedure(s) Performed: FLEXIBLE SIGMOIDOSCOPY (N/A ) BIOPSY     Patient location during evaluation: PACU Anesthesia Type: MAC Level of consciousness: awake and alert, oriented and awake Pain management: pain level controlled Vital Signs Assessment: post-procedure vital signs reviewed and stable Respiratory status: spontaneous breathing, nonlabored ventilation and respiratory function stable Cardiovascular status: stable and blood pressure returned to baseline Postop Assessment: no apparent nausea or vomiting Anesthetic complications: no   No complications documented.  Last Vitals:  Vitals:   05/20/20 1317 05/20/20 1327  BP: (!) 136/51 (!) 162/35  Pulse: 69 65  Resp: 13 15  Temp:    SpO2: 95% 95%    Last Pain:  Vitals:   05/20/20 1307  TempSrc: Axillary  PainSc:                  Catalina Gravel

## 2020-05-20 NOTE — Progress Notes (Signed)
Patient ID: Tonya Hughes, female   DOB: 12/17/1950, 69 y.o.   MRN: [id number]   PROGRESS NOTE    SHENEIKA WALSTAD  JGG:[id number] DOB: [birth year] DOA: 05/17/2020 PCP: Monico Blitz, MD   Brief Narrative:  69 year old female with history of PAD status post aortobifemoral bypass on 5/46/5035, chronic diastolic heart failure, hypertension, hyperlipidemia, MDD presented with abdominal pain, weakness and diarrhea and admitted per vascular surgery request due to concern for potential ischemic colitis.  In the ED, lipase was normal; C. difficile testing was positive for antigen, negative for toxin but positive for PCR.  CT of the abdomen and pelvis showed colitis of distal descending colon and rectosigmoid with improved inflammatory changes of the splenic flexure seen on prior CT; no pneumatosis or free air/portal venous gas.  She was started on IV antibiotics and oral vancomycin.  Assessment & Plan:   Possible ischemic colitis -Vascular surgery following and recommend antibiotic treatment.  Continue Rocephin and Flagyl.   -Pain management.  N.p.o. for now.  Rectal bleeding Acute blood loss anemia on top of anemia of chronic disease -Patient started to have rectal bleeding from 05/18/2020 onwards. -Hemoglobin dropped to 6.2 on 05/19/2020.  Status post 2 units packed red cells transfusion on 05/19/2020.  Hemoglobin 9.1 this morning.  Aspirin and prophylactic heparin were discontinued on 05/18/2020.  -GI following.  Probable flexible sigmoidoscopy/colonoscopy today  Probable C. difficile colitis -C. difficile testing was positive for antigen but negative for toxin but PCR was positive.  Patient symptomatic with diarrhea. -Has been started on vancomycin orally which will be continued for 10 days.  Hyponatremia -Sodium 128 on presentation.  Improving to 134.  Treated with IV fluids.  Repeat a.m. labs  Hypokalemia-improved.  Repeat a.m. labs  Acute kidney injury on chronic kidney disease stage probably  3a -Creatinine 1.3 on presentation.  Labs pending for today.  Monitor  Hypertension  -monitor blood pressure.  Antihypertensives including Lasix/lisinopril and Coreg on hold for now as blood pressure was low on presentation.  Hyperlipidemia -Continue Lipitor  MDD -Continue Paxil  COPD -Stable.  Continue current regimen  Obesity -Outpatient follow-up  Generalized deconditioning -PT recommends home health PT   DVT prophylaxis: SCDs.  Heparin discontinued on 05/18/2020 Code Status: Full Family Communication: Patient at bedside Disposition Plan: Status is: Inpatient  Remains inpatient appropriate because:Inpatient level of care appropriate due to severity of illness   Dispo: The patient is from: Home              Anticipated d/c is to: Home              Anticipated d/c date is: > 3 days              Patient currently is not medically stable to d/c.   Consultants: Vascular surgery  Procedures: None  Antimicrobials: Rocephin and IV Flagyl from 05/17/2020 onwards Oral vancomycin from 05/17/2020 onwards   Subjective: Patient seen and examined at bedside.  Poor historian.  BP, wakes up slightly, hardly answers any questions.  No overnight fever, vomiting, worsening shortness of breath reported. Objective: Vitals:   05/19/20 1455 05/19/20 1732 05/19/20 2011 05/20/20 0408  BP: (!) 132/43 (!) 141/54 (!) 138/49 (!) 145/53  Pulse: 60 68 (!) 57 61  Resp: 16 17 17 17   Temp: 97.9 F (36.6 C) 97.8 F (36.6 C) 98.4 F (36.9 C) 97.7 F (36.5 C)  TempSrc: Oral Oral Oral Oral  SpO2: 97% 98% 98% 97%  Weight:  Intake/Output Summary (Last 24 hours) at 05/20/2020 0743 Last data filed at 05/20/2020 0400 Gross per 24 hour  Intake 1219.02 ml  Output --  Net 1219.02 ml   Filed Weights   05/19/20 0423  Weight: 73.4 kg    Examination:  General exam: Chronically ill looking female.  Looks older than stated age.  Poor historian.  Sleepy, wakes up slightly, will answer  questions.  No distress. Respiratory system: Bilateral decreased breath sounds at bases with some scattered crackles.   Cardiovascular system: S1-S2 heard, intermittently bradycardic Gastrointestinal system: Abdomen is slightly distended, soft and has diffuse mild tenderness.  No rebound tenderness.  Bowel sounds are heard. Extremities: No clubbing or cyanosis.  Bilateral lower extremity edema present.   Central nervous system: Alert and awake with a poor historian.  No focal neurological deficits.  Moves extremities  skin: No obvious ecchymosis/rashes Psychiatry: Poor historian.  Does not communicate much on waking up.   Data Reviewed: I have personally reviewed following labs and imaging studies  CBC: Recent Labs  Lab 05/17/20 1518 05/17/20 1518 05/18/20 0505 05/18/20 1633 05/18/20 1937 05/19/20 0608 05/19/20 2324  WBC 9.7  --  6.5  --   --  6.0  --   HGB 10.8*   < > 9.2* 8.6* 8.0* 6.2* 8.6*  HCT 33.8*   < > 28.0* 27.5* 25.1* 19.6* 25.5*  MCV 87.6  --  88.9  --   --  90.7  --   PLT 304  --  240  --   --  215  --    < > = values in this interval not displayed.   Basic Metabolic Panel: Recent Labs  Lab 05/17/20 1518 05/18/20 0505 05/19/20 0608  NA 128* 132* 133*  K 3.1* 3.3* 4.2  CL 86* 91* 97*  CO2 26 29 26   GLUCOSE 123* 94 115*  BUN 18 15 13   CREATININE 1.32* 1.11* 1.02*  CALCIUM 8.4* 7.9* 7.7*  MG  --  1.8 1.7  PHOS  --  4.5  --    GFR: Estimated Creatinine Clearance: 51.4 mL/min (A) (by C-G formula based on SCr of 1.02 mg/dL (H)). Liver Function Tests: Recent Labs  Lab 05/17/20 1518  AST 29  ALT 15  ALKPHOS 66  BILITOT 0.9  PROT 7.4  ALBUMIN 2.6*   Recent Labs  Lab 05/17/20 1518  LIPASE 28   No results for input(s): AMMONIA in the last 168 hours. Coagulation Profile: No results for input(s): INR, PROTIME in the last 168 hours. Cardiac Enzymes: No results for input(s): CKTOTAL, CKMB, CKMBINDEX, TROPONINI in the last 168 hours. BNP (last 3  results) No results for input(s): PROBNP in the last 8760 hours. HbA1C: No results for input(s): HGBA1C in the last 72 hours. CBG: Recent Labs  Lab 05/19/20 1608 05/19/20 2013 05/20/20 0014 05/20/20 0407 05/20/20 0724  GLUCAP 100* 94 91 84 97   Lipid Profile: No results for input(s): CHOL, HDL, LDLCALC, TRIG, CHOLHDL, LDLDIRECT in the last 72 hours. Thyroid Function Tests: No results for input(s): TSH, T4TOTAL, FREET4, T3FREE, THYROIDAB in the last 72 hours. Anemia Panel: No results for input(s): VITAMINB12, FOLATE, FERRITIN, TIBC, IRON, RETICCTPCT in the last 72 hours. Sepsis Labs: Recent Labs  Lab 05/18/20 0055 05/18/20 0505  LATICACIDVEN 1.1 0.9    Recent Results (from the past 240 hour(s))  C Difficile Quick Screen w PCR reflex     Status: Abnormal   Collection Time: 05/17/20  7:49 PM   Specimen: STOOL  Result Value Ref Range Status   C Diff antigen POSITIVE (A) NEGATIVE Final   C Diff toxin NEGATIVE NEGATIVE Final   C Diff interpretation Results are indeterminate. See PCR results.  Final    Comment: Performed at [facility], [place] [address], [place], [place] [address]  C. Diff by PCR, Reflexed     Status: Abnormal   Collection Time: 05/17/20  7:49 PM  Result Value Ref Range Status   Toxigenic C. Difficile by PCR POSITIVE (A) NEGATIVE Final    Comment: Positive for toxigenic C. difficile with little to no toxin production. Only treat if clinical presentation suggests symptomatic illness. Performed at [facility], [place] [address] [place], [place] [address]   SARS Coronavirus 2 by RT PCR (hospital order, performed in [facility] hospital lab) Nasopharyngeal Nasopharyngeal Swab     Status: None   Collection Time: 05/18/20 12:16 AM   Specimen: Nasopharyngeal Swab  Result Value Ref Range Status   SARS Coronavirus 2 NEGATIVE NEGATIVE Final    Comment: (NOTE) SARS-CoV-2 target nucleic acids are NOT DETECTED.  The SARS-CoV-2 RNA is generally detectable in  upper and lower respiratory specimens during the acute phase of infection. The lowest concentration of SARS-CoV-2 viral copies this assay can detect is 250 copies / mL. A negative result does not preclude SARS-CoV-2 infection and should not be used as the sole basis for treatment or other patient management decisions.  A negative result may occur with improper specimen collection / handling, submission of specimen other than nasopharyngeal swab, presence of viral mutation(s) within the areas targeted by this assay, and inadequate number of viral copies (<250 copies / mL). A negative result must be combined with clinical observations, patient history, and epidemiological information.  Fact Sheet for Patients:   StrictlyIdeas.no  Fact Sheet for Healthcare Providers: BankingDealers.co.za  This test is not yet approved or  cleared by the [place] FDA and has been authorized for detection and/or diagnosis of SARS-CoV-2 by FDA under an Emergency Use Authorization (EUA).  This EUA will remain in effect (meaning this test can be used) for the duration of the COVID-19 declaration under Section 564(b)(1) of the Act, 21 U.S.C. section 360bbb-3(b)(1), unless the authorization is terminated or revoked sooner.  Performed at [facility], [place] [address] [place], [place] [address]          Radiology Studies: No results found.      Scheduled Meds: . sodium chloride   Intravenous Once  . albuterol  2.5 mg Nebulization BID  . arformoterol  15 mcg Nebulization BID  . atorvastatin  40 mg Oral QHS  . insulin aspart  0-9 Units Subcutaneous Q4H  . loratadine  10 mg Oral Daily  . PARoxetine  20 mg Oral Daily  . sodium chloride flush  10-40 mL Intracatheter Q12H  . vancomycin  125 mg Oral QID   Continuous Infusions: . cefTRIAXone (ROCEPHIN)  IV 2 g (05/19/20 2212)  . metronidazole 500 mg (05/20/20 0131)          Aline August, MD [facility] 05/20/2020, 7:43 AM

## 2020-05-20 NOTE — Plan of Care (Signed)
  Problem: Education: Goal: Knowledge of General Education information will improve Description: Including pain rating scale, medication(s)/side effects and non-pharmacologic comfort measures Outcome: Progressing   Problem: Clinical Measurements: Goal: Ability to maintain clinical measurements within normal limits will improve Outcome: Progressing Goal: Will remain free from infection Outcome: Progressing Goal: Diagnostic test results will improve Outcome: Progressing Goal: Respiratory complications will improve Outcome: Progressing Goal: Cardiovascular complication will be avoided Outcome: Progressing   Problem: Coping: Goal: Level of anxiety will decrease Outcome: Progressing   Problem: Elimination: Goal: Will not experience complications related to urinary retention Outcome: Progressing   Problem: Pain Managment: Goal: General experience of comfort will improve Outcome: Progressing   Problem: Safety: Goal: Ability to remain free from injury will improve Outcome: Progressing   Problem: Skin Integrity: Goal: Risk for impaired skin integrity will decrease Outcome: Progressing

## 2020-05-20 NOTE — Transfer of Care (Signed)
Immediate Anesthesia Transfer of Care Note  Patient: Tonya Hughes  Procedure(s) Performed: FLEXIBLE SIGMOIDOSCOPY (N/A ) BIOPSY  Patient Location: Endoscopy Unit  Anesthesia Type:MAC  Level of Consciousness: drowsy, patient cooperative and responds to stimulation  Airway & Oxygen Therapy: Patient Spontanous Breathing and Patient connected to nasal cannula oxygen  Post-op Assessment: Report given to RN and Post -op Vital signs reviewed and stable  Post vital signs: Reviewed and stable  Last Vitals:  Vitals Value Taken Time  BP    Temp    Pulse    Resp    SpO2      Last Pain:  Vitals:   05/20/20 1132  TempSrc: Temporal  PainSc: 0-No pain      Patients Stated Pain Goal: 0 (52/48/18 5909)  Complications: No complications documented.

## 2020-05-20 NOTE — Progress Notes (Signed)
Occupational Therapy Treatment Patient Details Name: Tonya Hughes MRN: [id number] DOB: 04-14-1951 Today's Date: 05/20/2020    History of present illness Pt is a 69 year old woman admitted one day after d/c from SNF with abdominal pain and diarrhea, hyponatremia and AKI. Possible ischemic colitis. Pt discharged from [facility] on 04/29/20 after admission for aortobifemoral BPG with complication of sigmoid ischemia. PMH: CHF, COPD, MDD, HTN, HLD.     OT comments  Pt received semi-reclined in bed, agreeable to OT tx with max encouragement. Pt immediately requesting dinner upon arrival, nursing staff notified. Pt requested to use BSC, requiring min guard for transitioning to EOB using bed railings for support. Before transferring to [facility], pt accidentally had large BM sitting EOB, pt unaware of accident and frustrated when she was told what happened. Pt immediately laid back down and refused to participate in bed linen/pt clean up. During clean up pt demonstrated inappropriate behaviors including- screaming, threatening, cursing (e.g. "My bottom hurts so bad, if you wipe me one more time I am going to hit you!"). Pt able to roll L and R with Mod I using bed railing but required total assist for perineal/perianal care in side lying positions. Pt setup to eat dinner in bed. Educated pt on self-advocacy in regards to needing to use the restroom. Recommending HHOT with 24/7 S/A as needed. Pt would benefit from continued skilled acute OT services to address self-care, ADLs, and functional transfers.    Follow Up Recommendations  Home health OT;Supervision/Assistance - 24 hour    Equipment Recommendations  None recommended by OT    Recommendations for Other Services  Psych consult.    Precautions / Restrictions Precautions Precautions: Fall Restrictions Weight Bearing Restrictions: No       Mobility Bed Mobility Overal bed mobility: Modified Independent Bed Mobility: Rolling;Sidelying to Sit;Sit to  Sidelying Rolling: Modified independent (Device/Increase time) Sidelying to sit: Min guard;HOB elevated     Sit to sidelying: Min guard;HOB elevated General bed mobility comments: pt able to roll L and R and transition to EOB with min guard for safety and initiation using bed railings  Transfers     General transfer comment:  (N/A secondary to BM sitting EOB and pt refusing BSC transfer)    Balance Overall balance assessment: Needs assistance Sitting-balance support: Feet supported;No upper extremity supported Sitting balance-Leahy Scale: Good Sitting balance - Comments: no LOB      ADL either performed or assessed with clinical judgement   ADL Overall ADL's : Needs assistance/impaired Eating/Feeding: Set up;Bed level Eating/Feeding Details (indicate cue type and reason): pt able to self-feed with setup sitting upright in bed with tray out front     Toileting- Clothing Manipulation and Hygiene: Total assistance;Bed level Toileting - Clothing Manipulation Details (indicate cue type and reason): extended time to clean up BM in bed; pt refusing to participate in wiping perineal/perianal areas       General ADL Comments: pt agreed to use [facility] when asked by therapist, pt had BM sitting EOB before performing stand pivot transfer; pt angry and laid down; pt refused to participate in bed linen/toileting hygiene clean up     Cognition Arousal/Alertness: Awake/alert Behavior During Therapy: Flat affect;Anxious;Agitated Overall Cognitive Status: Impaired/Different from baseline Area of Impairment: Attention;Memory;Following commands;Safety/judgement;Awareness;Problem solving       Memory: Decreased short-term memory Following Commands: Follows one step commands with increased time Safety/Judgement: Decreased awareness of safety;Decreased awareness of deficits   Problem Solving: Slow processing;Decreased initiation;Requires verbal cues;Requires tactile cues General  Comments: pt  focused on desire to eat dinner; pt angry and agitated              General Comments pt's perianal area irritated/red- applied cream after wiping up BM; pt irritated, angry, and acting inappropriate on this date- screaming, threatening (e.g. "My bottom hurts so bad, if you wipe me one more time I am going to hit you!"), cursing; nursing notified of pain level    Pertinent Vitals/ Pain       Pain Assessment: 0-10 Pain Score: 10-Worst pain ever Faces Pain Scale: Hurts even more Pain Location: abdomen Pain Descriptors / Indicators: Sore;Aching;Discomfort;Guarding Pain Intervention(s): Limited activity within patient's tolerance;Monitored during session;Repositioned;Patient requesting pain meds-RN notified         Frequency  Min 2X/week        Progress Toward Goals  OT Goals(current goals can now be found in the care plan section)  Progress towards OT goals: Progressing toward goals  Acute Rehab OT Goals Patient Stated Goal: eat and drink OT Goal Formulation: With patient Time For Goal Achievement: 06/01/20 Potential to Achieve Goals: Good ADL Goals Pt Will Perform Grooming: with min guard assist;standing Pt Will Perform Upper Body Bathing: with set-up;sitting Pt Will Perform Lower Body Bathing: with min guard assist;sit to/from stand Pt Will Perform Upper Body Dressing: with set-up;sitting Pt Will Perform Lower Body Dressing: with min guard assist;sit to/from stand Pt Will Transfer to Toilet: with min guard assist;ambulating;bedside commode Pt Will Perform Toileting - Clothing Manipulation and hygiene: with min guard assist;sit to/from stand Additional ADL Goal #1: Pt will sustain attention to ADL/functional task >5 min with no more than min cues for redirection.  Plan Discharge plan remains appropriate       AM-PAC OT "6 Clicks" Daily Activity     Outcome Measure   Help from another person eating meals?: None Help from another person taking care of personal  grooming?: A Little Help from another person toileting, which includes using toliet, bedpan, or urinal?: A Lot Help from another person bathing (including washing, rinsing, drying)?: A Lot Help from another person to put on and taking off regular upper body clothing?: A Little Help from another person to put on and taking off regular lower body clothing?: A Lot 6 Click Score: 16    End of Session Equipment Utilized During Treatment: Gait belt  OT Visit Diagnosis: Other abnormalities of gait and mobility (R26.89);Muscle weakness (generalized) (M62.81);Other symptoms and signs involving cognitive function   Activity Tolerance Patient limited by fatigue;Patient limited by pain;Treatment limited secondary to agitation   Patient Left in bed;with call bell/phone within reach;with bed alarm set   Nurse Communication Mobility status        Time: 2376-2831 OT Time Calculation (min): 58 min  Charges: OT General Charges $OT Visit: 1 Visit OT Treatments $Self Care/Home Management : 23-37 mins $Therapeutic Activity: 23-37 mins  Michel Bickers, OTR/L [facility] [phone]   Francesca Jewett 05/20/2020, 7:24 PM

## 2020-05-20 NOTE — Op Note (Signed)
[facility] Patient Name: Tonya Hughes Procedure Date : 05/20/2020 MRN: [id number] Attending MD: Arta Silence , MD Date of Birth: 05-12-1951 CSN: [id number] Age: 69 Admit Type: Inpatient Procedure:                Flexible Sigmoidoscopy Indications:              Lower abdominal pain, , Hematochezia, Diarrhea,                            history of ischemic colitis Providers:                Arta Silence, MD, Grace Isaac, RN, Lazaro Arms,                            Technician Referring MD:             [facility]. Medicines:                Monitored Anesthesia Care Complications:            No immediate complications. Estimated Blood Loss:     Estimated blood loss was minimal. Procedure:                Pre-Anesthesia Assessment:                           - Prior to the procedure, a History and Physical                            was performed, and patient medications and                            allergies were reviewed. The patient's tolerance of                            previous anesthesia was also reviewed. The risks                            and benefits of the procedure and the sedation                            options and risks were discussed with the patient.                            All questions were answered, and informed consent                            was obtained. Prior Anticoagulants: The patient has                            taken no previous anticoagulant or antiplatelet                            agents except for aspirin. ASA Grade Assessment:  III - A patient with severe systemic disease. After                            reviewing the risks and benefits, the patient was                            deemed in satisfactory condition to undergo the                            procedure.                           The GIF-H190 ([id number] Olympus gastroscope was                            introduced through the and  advanced to the mid                            sigmoid colon. The patient tolerated the procedure                            well. The quality of the bowel preparation was                            adequate. Scope In: Scope Out: Findings:      Hemorrhoids were found on perianal exam.      A diffuse area of moderately congested, erythematous, ulcerated and       vascular-pattern-decreased mucosa was found in the proximal rectum, in       the mid rectum, in the distal rectum, in the recto-sigmoid colon and in       the sigmoid colon. Biopsies were taken with a cold forceps for       histology. Mucosa diffusely friable to mild touch. Given severity of       rectosigmoid colitis, retroflexed views of the rectum were not obtained.       Estimated blood loss was minimal.      A moderate stenosis was found in the distal sigmoid colon, suspect       developing ischemic colitis stricture. Unable to pass endoscope through       stricture. Impression:               - Hemorrhoids found on perianal exam.                           - Congested, erythematous, ulcerated and                            vascular-pattern-decreased mucosa in the proximal                            rectum, in the mid rectum, in the distal rectum, in                            the recto-sigmoid colon and in the sigmoid colon.  Biopsied.                           - Stricture in the distal sigmoid colon.                           - The examination was otherwise normal.                           - Overall appearance most consistent with ischemic                            colitis, perhaps slightly improving; no evidence of                            mucosal gangrenous changes. Moderate Sedation:      None Recommendation:           - Return patient to hospital ward for ongoing care.                           - Clear liquid diet today.                           - Continue present medications.                            - Await pathology results.                           [facility] will follow. Unfortunately, not much                            else can be done from a GI perspective. Procedure Code(s):        --- Professional ---                           [phone], Sigmoidoscopy, flexible; with biopsy, single                            or multiple Diagnosis Code(s):        --- Professional ---                           K62.6, Ulcer of anus and rectum                           K63.3, Ulcer of intestine                           K62.89, Other specified diseases of anus and rectum                           K63.89, Other specified diseases of intestine                           K56.699, Other intestinal obstruction unspecified  as to partial versus complete obstruction                           K64.9, Unspecified hemorrhoids                           R10.30, Lower abdominal pain, unspecified                           K92.1, Melena (includes Hematochezia)                           R19.7, Diarrhea, unspecified CPT copyright 2019 [employer]. All rights reserved. The codes documented in this report are preliminary and upon coder review may  be revised to meet current compliance requirements. Arta Silence, MD 05/20/2020 1:14:20 PM This report has been signed electronically. Number of Addenda: 0

## 2020-05-20 NOTE — Progress Notes (Signed)
Patient's staples to midline incision removed per order. Pain in some areas with removal, however, patient tolerated procedure well.

## 2020-05-20 NOTE — Interval H&P Note (Signed)
History and Physical Interval Note:  05/20/2020 12:24 PM  Tonya Hughes  has presented today for surgery, with the diagnosis of Colitis.  The various methods of treatment have been discussed with the patient and family. After consideration of risks, benefits and other options for treatment, the patient has consented to  Procedure(s): FLEXIBLE SIGMOIDOSCOPY (N/A) as a surgical intervention.  The patient's history has been reviewed, patient examined, no change in status, stable for surgery.  I have reviewed the patient's chart and labs.  Questions were answered to the patient's satisfaction.     Landry Dyke

## 2020-05-20 NOTE — Progress Notes (Addendum)
  Progress Note    05/20/2020 7:50 AM Day of Surgery  Subjective:  C/o thrist and hunger. Says she was incontinent of stool, but is unaware of whether it was bloody. Denies N, V, pain  Admission notes reviewed  Vitals:   05/19/20 2011 05/20/20 0408  BP: (!) 138/49 (!) 145/53  Pulse: (!) 57 61  Resp: 17 17  Temp: 98.4 F (36.9 C) 97.7 F (36.5 C)  SpO2: 98% 97%    Physical Exam: Cardiac:  RRR Lungs:  nonlabored Incisions:  Midline incision with intact staples.  Extremities:  Dressing to right heel.  2+ DP pulses bil Abdomen:  Soft, NT, +BS  CBC    Component Value Date/Time   WBC 6.0 05/19/2020 0608   RBC 2.16 (L) 05/19/2020 0608   HGB 8.6 (L) 05/19/2020 2324   HCT 25.5 (L) 05/19/2020 2324   PLT 215 05/19/2020 0608   MCV 90.7 05/19/2020 0608   MCH 28.7 05/19/2020 0608   MCHC 31.6 05/19/2020 0608   RDW 16.6 (H) 05/19/2020 0608   LYMPHSABS 0.8 04/26/2020 0830   MONOABS 0.5 04/26/2020 0830   EOSABS 0.3 04/26/2020 0830   BASOSABS 0.0 04/26/2020 0830    BMET    Component Value Date/Time   NA 133 (L) 05/19/2020 0608   K 4.2 05/19/2020 0608   CL 97 (L) 05/19/2020 0608   CO2 26 05/19/2020 0608   GLUCOSE 115 (H) 05/19/2020 0608   BUN 13 05/19/2020 0608   CREATININE 1.02 (H) 05/19/2020 0608   CALCIUM 7.7 (L) 05/19/2020 0608   GFRNONAA 57 (L) 05/19/2020 0608   GFRAA >60 05/19/2020 6389     Intake/Output Summary (Last 24 hours) at 05/20/2020 0750 Last data filed at 05/20/2020 0400 Gross per 24 hour  Intake 1219.02 ml  Output --  Net 1219.02 ml    HOSPITAL MEDICATIONS Scheduled Meds: . sodium chloride   Intravenous Once  . albuterol  2.5 mg Nebulization BID  . arformoterol  15 mcg Nebulization BID  . atorvastatin  40 mg Oral QHS  . insulin aspart  0-9 Units Subcutaneous Q4H  . loratadine  10 mg Oral Daily  . PARoxetine  20 mg Oral Daily  . sodium chloride flush  10-40 mL Intracatheter Q12H  . vancomycin  125 mg Oral QID   Continuous Infusions: .  cefTRIAXone (ROCEPHIN)  IV 2 g (05/19/20 2212)  . metronidazole 500 mg (05/20/20 0131)   PRN Meds:.acetaminophen, fentaNYL (SUBLIMAZE) injection, ondansetron (ZOFRAN) IV, sodium chloride flush  Assessment: s/p aortobifem 6/17 secondary to LE ischemia with rest pain. Admitted secondary to ischemic colitis, anemia. Hgb 8.2 from 6.2 after 2 u PCs. Afebrile, VSS    Plan: -for sigmoidoscopy vs. Colonoscopy today. DC staples    Risa Grill, PA-C [facility] [phone] 05/20/2020  7:50 AM   I have independently interviewed and examined patient and agree with PA assessment and plan above. Will d/c staples prior to dc. Dry dressings placed in bilateral groins.   Tynisha Ogan C. Donzetta Matters, MD [facility] [place] Office: [phone] Pager: [phone]

## 2020-05-20 NOTE — Anesthesia Preprocedure Evaluation (Signed)
Anesthesia Evaluation  Patient identified by MRN, date of birth, ID band Patient awake    Reviewed: Allergy & Precautions, NPO status , Patient's Chart, lab work & pertinent test results, reviewed documented beta blocker date and time   Airway Mallampati: II  TM Distance: >3 FB Neck ROM: Full    Dental  (+) Dental Advisory Given, Edentulous Upper, Edentulous Lower   Pulmonary sleep apnea , former smoker,    Pulmonary exam normal breath sounds clear to auscultation       Cardiovascular hypertension, Pt. on medications and Pt. on home beta blockers + Peripheral Vascular Disease, +CHF and + DVT  Normal cardiovascular exam Rhythm:Regular Rate:Normal     Neuro/Psych PSYCHIATRIC DISORDERS Depression  Neuromuscular disease    GI/Hepatic Neg liver ROS, GERD  ,Colitis   Endo/Other  diabetes, Type 2  Renal/GU negative Renal ROS     Musculoskeletal  (+) Arthritis ,   Abdominal   Peds  Hematology negative hematology ROS (+)   Anesthesia Other Findings Day of surgery medications reviewed with the patient.  Reproductive/Obstetrics                             Anesthesia Physical Anesthesia Plan  ASA: III  Anesthesia Plan: MAC   Post-op Pain Management:    Induction: Intravenous  PONV Risk Score and Plan: 2 and Propofol infusion and Treatment may vary due to age or medical condition  Airway Management Planned: Nasal Cannula and Natural Airway  Additional Equipment:   Intra-op Plan:   Post-operative Plan:   Informed Consent: I have reviewed the patients History and Physical, chart, labs and discussed the procedure including the risks, benefits and alternatives for the proposed anesthesia with the patient or authorized representative who has indicated his/her understanding and acceptance.     Dental advisory given  Plan Discussed with: CRNA and Anesthesiologist  Anesthesia Plan Comments:          Anesthesia Quick Evaluation

## 2020-05-21 DIAGNOSIS — K559 Vascular disorder of intestine, unspecified: Secondary | ICD-10-CM | POA: Diagnosis not present

## 2020-05-21 DIAGNOSIS — A0472 Enterocolitis due to Clostridium difficile, not specified as recurrent: Secondary | ICD-10-CM | POA: Diagnosis not present

## 2020-05-21 DIAGNOSIS — F329 Major depressive disorder, single episode, unspecified: Secondary | ICD-10-CM | POA: Diagnosis not present

## 2020-05-21 DIAGNOSIS — I10 Essential (primary) hypertension: Secondary | ICD-10-CM | POA: Diagnosis not present

## 2020-05-21 LAB — TYPE AND SCREEN
ABO/RH(D): A NEG
Antibody Screen: NEGATIVE
Unit division: 0
Unit division: 0

## 2020-05-21 LAB — BASIC METABOLIC PANEL
Anion gap: 10 (ref 5–15)
BUN: 7 mg/dL — ABNORMAL LOW (ref 8–23)
CO2: 23 mmol/L (ref 22–32)
Calcium: 7.6 mg/dL — ABNORMAL LOW (ref 8.9–10.3)
Chloride: 99 mmol/L (ref 98–111)
Creatinine, Ser: 0.78 mg/dL (ref 0.44–1.00)
GFR calc Af Amer: >60 mL/min (ref >60)
GFR calc non Af Amer: >60 mL/min (ref >60)
Glucose, Bld: 86 mg/dL (ref 70–99)
Potassium: 3.8 mmol/L (ref 3.5–5.1)
Sodium: 132 mmol/L — ABNORMAL LOW (ref 135–145)

## 2020-05-21 LAB — BPAM RBC
Blood Product Expiration Date: 202107222359
Blood Product Expiration Date: 202108102359
ISSUE DATE / TIME: 202107181121
ISSUE DATE / TIME: 202107181429
Unit Type and Rh: 600
Unit Type and Rh: 600

## 2020-05-21 LAB — CBC
HCT: 25 % — ABNORMAL LOW (ref 36.0–46.0)
Hemoglobin: 8.4 g/dL — ABNORMAL LOW (ref 12.0–15.0)
MCH: 30.3 pg (ref 26.0–34.0)
MCHC: 33.6 g/dL (ref 30.0–36.0)
MCV: 90.3 fL (ref 80.0–100.0)
Platelets: 116 10*3/uL — ABNORMAL LOW (ref 150–400)
RBC: 2.77 MIL/uL — ABNORMAL LOW (ref 3.87–5.11)
RDW: 15.9 % — ABNORMAL HIGH (ref 11.5–15.5)
WBC: 5.6 10*3/uL (ref 4.0–10.5)
nRBC: 0 % (ref 0.0–0.2)

## 2020-05-21 LAB — GLUCOSE, CAPILLARY
Glucose-Capillary: 101 mg/dL — ABNORMAL HIGH (ref 70–99)
Glucose-Capillary: 113 mg/dL — ABNORMAL HIGH (ref 70–99)
Glucose-Capillary: 113 mg/dL — ABNORMAL HIGH (ref 70–99)
Glucose-Capillary: 115 mg/dL — ABNORMAL HIGH (ref 70–99)
Glucose-Capillary: 88 mg/dL (ref 70–99)
Glucose-Capillary: 93 mg/dL (ref 70–99)

## 2020-05-21 LAB — SURGICAL PATHOLOGY

## 2020-05-21 MED ORDER — CARVEDILOL 3.125 MG PO TABS
3.1250 mg | ORAL_TABLET | Freq: Two times a day (BID) | ORAL | Status: DC
  Administered 2020-05-21 – 2020-05-30 (×19): 3.125 mg via ORAL
  Filled 2020-05-21 (×20): qty 1

## 2020-05-21 MED ORDER — ALBUTEROL SULFATE (2.5 MG/3ML) 0.083% IN NEBU: 2.5000 mg | INHALATION_SOLUTION | RESPIRATORY_TRACT | Status: DC | PRN

## 2020-05-21 NOTE — Progress Notes (Signed)
Pt refused oral vancomycin.  Education given.  MD notified.  Will continue to monitor

## 2020-05-21 NOTE — Progress Notes (Signed)
Patient ID: Tonya Hughes, female   DOB: April 26, 1951, 69 y.o.   MRN: [id number]   PROGRESS NOTE    MOZEL BURDETT  RJJ:[id number] DOB: 02/05/1951 DOA: 05/17/2020 PCP: Monico Blitz, MD   Brief Narrative:  69 year old female with history of PAD status post aortobifemoral bypass on 0/16/0109, chronic diastolic heart failure, hypertension, hyperlipidemia, MDD presented with abdominal pain, weakness and diarrhea and admitted per vascular surgery request due to concern for potential ischemic colitis.  In the ED, lipase was normal; C. difficile testing was positive for antigen, negative for toxin but positive for PCR.  CT of the abdomen and pelvis showed colitis of distal descending colon and rectosigmoid with improved inflammatory changes of the splenic flexure seen on prior CT; no pneumatosis or free air/portal venous gas.  She was started on IV antibiotics and oral vancomycin.  Subsequently, she started having rectal bleeding with drop in hemoglobin requiring 2 units packed red cell transfusion.  GI was consulted and patient underwent flexible sigmoidoscopy on 05/20/2020.  Assessment & Plan:   Possible ischemic colitis -Vascular surgery following and recommend antibiotic treatment.  Continue Rocephin and Flagyl.   -Pain management.  Rectal bleeding Acute blood loss anemia on top of anemia of chronic disease -Patient started to have rectal bleeding from 05/18/2020 onwards. -Hemoglobin dropped to 6.2 on 05/19/2020.  Status post 2 units packed red cells transfusion on 05/19/2020.  Hemoglobin 8.4 this morning.  Aspirin and prophylactic heparin were discontinued on 05/18/2020.  -GI following.  Status post flexible sigmoidoscopy on 05/20/2020 showing findings most consistent with ischemic colitis with no evidence of mucosal gangrenous changes; await pathology.  Follow further GI recommendations. -Advance diet to soft diet today.  Probable C. difficile colitis -C. difficile testing was positive for antigen but  negative for toxin but PCR was positive.  Patient symptomatic with diarrhea. -Has been started on vancomycin orally which will be continued for 10 days.  Hyponatremia -Sodium 128 on presentation.  132 today.  Treated with IV fluids and subsequently IV fluids were discontinued.  Repeat a.m. labs  Hypokalemia-improved.  Repeat a.m. labs  Acute kidney injury on chronic kidney disease stage probably 3a -Creatinine 1.3 on presentation.  0.78 today.  Monitor  Hypertension  -monitor blood pressure.  Antihypertensives including Lasix/lisinopril and Coreg on hold since admission.  Blood pressure creeping up.  Will probably restart Coreg.  Hyperlipidemia -Continue Lipitor  MDD -Continue Paxil  COPD -Stable.  Continue current regimen  Obesity -Outpatient follow-up  Generalized deconditioning -PT recommends home health PT   DVT prophylaxis: SCDs.  Heparin discontinued on 05/18/2020 Code Status: Full Family Communication: Patient at bedside Disposition Plan: Status is: Inpatient  Remains inpatient appropriate because:Inpatient level of care appropriate due to severity of illness.  Discharge probably in 1 to 2 days if remains stable, tolerates diet with no further episodes of rectal bleeding and if cleared by GI.   Dispo: The patient is from: Home              Anticipated d/c is to: Home              Anticipated d/c date is: 2 days              Patient currently is not medically stable to d/c.   Consultants: Vascular surgery/GI  Procedures:  Flexible sigmoidoscopy on 05/20/2020 Impression:               - Hemorrhoids found on perianal exam.                           -  Congested, erythematous, ulcerated and                            vascular-pattern-decreased mucosa in the proximal                            rectum, in the mid rectum, in the distal rectum, in                            the recto-sigmoid colon and in the sigmoid colon.                            Biopsied.                            - Stricture in the distal sigmoid colon.                           - The examination was otherwise normal.                           - Overall appearance most consistent with ischemic                            colitis, perhaps slightly improving; no evidence of                            mucosal gangrenous changes. Moderate Sedation:      None Recommendation:           - Return patient to hospital ward for ongoing care.                           - Clear liquid diet today.                           - Continue present medications.                           - Await pathology results.                           [facility] will follow. Unfortunately, not much                            else can be done from a GI perspective.  Antimicrobials: Rocephin and IV Flagyl from 05/17/2020 onwards Oral vancomycin from 05/17/2020 onwards   Subjective: Patient seen and examined at bedside.  Poor historian.  Does not want to participate in conversation much.  Complains of mild abdominal pain.  Feels hungry and wants to eat solid food.  No overnight fever, vomiting or rectal bleeding reported by nursing staff.    Objective: Vitals:   05/20/20 1327 05/20/20 1512 05/20/20 2016 05/21/20 0457  BP: (!) 162/35 (!) 165/51 (!) 147/61 (!) 156/67  Pulse: 65 69 72 72  Resp: 15 16 18 16   Temp:  (!) 97.4 F (36.3 C) 98.7 F (37.1 C) 98 F (  36.7 C)  TempSrc:  Oral Oral Oral  SpO2: 95% 98% 99% 98%  Weight:        Intake/Output Summary (Last 24 hours) at 05/21/2020 0737 Last data filed at 05/21/2020 0600 Gross per 24 hour  Intake 1285.92 ml  Output --  Net 1285.92 ml   Filed Weights   05/19/20 0423  Weight: 73.4 kg    Examination:  General exam: No acute distress.  Chronically ill looking female.  Looks older than stated age.  Poor historian.  Respiratory system: Bilateral decreased breath sounds at bases with no wheezing.  Some crackles heard  cardiovascular system: S1-S2 heard,  currently rate controlled  gastrointestinal system: Abdomen is slightly distended, soft and mildly tender in the periumbilical region.  No rebound tenderness.  Normal bowel sounds heard. Extremities: Trace bilateral lower extremity edema present.  No clubbing Central nervous system: Awake, very poor historian.  No focal neurological deficits.  Moving extremities skin: No obvious lesions/petechiae  psychiatry: Does not want to communicate much.  Flat affect.   Data Reviewed: I have personally reviewed following labs and imaging studies  CBC: Recent Labs  Lab 05/17/20 1518 05/17/20 1518 05/18/20 0505 05/18/20 1633 05/18/20 1937 05/19/20 7494 05/19/20 2324 05/20/20 0648 05/21/20 0323  WBC 9.7  --  6.5  --   --  6.0  --  6.3 5.6  HGB 10.8*   < > 9.2*   < > 8.0* 6.2* 8.6* 9.1* 8.4*  HCT 33.8*   < > 28.0*   < > 25.1* 19.6* 25.5* 26.9* 25.0*  MCV 87.6  --  88.9  --   --  90.7  --  89.7 90.3  PLT 304  --  240  --   --  215  --  235 116*   < > = values in this interval not displayed.   Basic Metabolic Panel: Recent Labs  Lab 05/17/20 1518 05/18/20 0505 05/19/20 0608 05/20/20 0648 05/21/20 0323  NA 128* 132* 133* 134* 132*  K 3.1* 3.3* 4.2 3.9 3.8  CL 86* 91* 97* 100 99  CO2 26 29 26 26 23   GLUCOSE 123* 94 115* 94 86  BUN 18 15 13 8  7*  CREATININE 1.32* 1.11* 1.02* 0.80 0.78  CALCIUM 8.4* 7.9* 7.7* 7.7* 7.6*  MG  --  1.8 1.7  --   --   PHOS  --  4.5  --   --   --    GFR: Estimated Creatinine Clearance: 65.5 mL/min (by C-G formula based on SCr of 0.78 mg/dL). Liver Function Tests: Recent Labs  Lab 05/17/20 1518  AST 29  ALT 15  ALKPHOS 66  BILITOT 0.9  PROT 7.4  ALBUMIN 2.6*   Recent Labs  Lab 05/17/20 1518  LIPASE 28   No results for input(s): AMMONIA in the last 168 hours. Coagulation Profile: No results for input(s): INR, PROTIME in the last 168 hours. Cardiac Enzymes: No results for input(s): CKTOTAL, CKMB, CKMBINDEX, TROPONINI in the last 168  hours. BNP (last 3 results) No results for input(s): PROBNP in the last 8760 hours. HbA1C: No results for input(s): HGBA1C in the last 72 hours. CBG: Recent Labs  Lab 05/20/20 0407 05/20/20 0724 05/20/20 1604 05/20/20 2017 05/21/20 0455  GLUCAP 84 97 108* 98 93   Lipid Profile: No results for input(s): CHOL, HDL, LDLCALC, TRIG, CHOLHDL, LDLDIRECT in the last 72 hours. Thyroid Function Tests: No results for input(s): TSH, T4TOTAL, FREET4, T3FREE, THYROIDAB in the last 72 hours. Anemia Panel:  No results for input(s): VITAMINB12, FOLATE, FERRITIN, TIBC, IRON, RETICCTPCT in the last 72 hours. Sepsis Labs: Recent Labs  Lab 05/18/20 0055 05/18/20 0505  LATICACIDVEN 1.1 0.9    Recent Results (from the past 240 hour(s))  C Difficile Quick Screen w PCR reflex     Status: Abnormal   Collection Time: 05/17/20  7:49 PM   Specimen: STOOL  Result Value Ref Range Status   C Diff antigen POSITIVE (A) NEGATIVE Final   C Diff toxin NEGATIVE NEGATIVE Final   C Diff interpretation Results are indeterminate. See PCR results.  Final    Comment: Performed at [facility], [place] [address] [place], [place] [address]  C. Diff by PCR, Reflexed     Status: Abnormal   Collection Time: 05/17/20  7:49 PM  Result Value Ref Range Status   Toxigenic C. Difficile by PCR POSITIVE (A) NEGATIVE Final    Comment: Positive for toxigenic C. difficile with little to no toxin production. Only treat if clinical presentation suggests symptomatic illness. Performed at [facility], [place] [address], [place], [place] [address]   SARS Coronavirus 2 by RT PCR (hospital order, performed in [facility] hospital lab) Nasopharyngeal Nasopharyngeal Swab     Status: None   Collection Time: 05/18/20 12:16 AM   Specimen: Nasopharyngeal Swab  Result Value Ref Range Status   SARS Coronavirus 2 NEGATIVE NEGATIVE Final    Comment: (NOTE) SARS-CoV-2 target nucleic acids are NOT DETECTED.  The SARS-CoV-2 RNA is  generally detectable in upper and lower respiratory specimens during the acute phase of infection. The lowest concentration of SARS-CoV-2 viral copies this assay can detect is 250 copies / mL. A negative result does not preclude SARS-CoV-2 infection and should not be used as the sole basis for treatment or other patient management decisions.  A negative result may occur with improper specimen collection / handling, submission of specimen other than nasopharyngeal swab, presence of viral mutation(s) within the areas targeted by this assay, and inadequate number of viral copies (<250 copies / mL). A negative result must be combined with clinical observations, patient history, and epidemiological information.  Fact Sheet for Patients:   StrictlyIdeas.no  Fact Sheet for Healthcare Providers: BankingDealers.co.za  This test is not yet approved or  cleared by the [place] FDA and has been authorized for detection and/or diagnosis of SARS-CoV-2 by FDA under an Emergency Use Authorization (EUA).  This EUA will remain in effect (meaning this test can be used) for the duration of the COVID-19 declaration under Section 564(b)(1) of the Act, 21 U.S.C. section 360bbb-3(b)(1), unless the authorization is terminated or revoked sooner.  Performed at [facility], [place] [address] [place], [place] [address]          Radiology Studies: No results found.      Scheduled Meds: . sodium chloride   Intravenous Once  . albuterol  2.5 mg Nebulization BID  . arformoterol  15 mcg Nebulization BID  . atorvastatin  40 mg Oral QHS  . insulin aspart  0-9 Units Subcutaneous Q4H  . loratadine  10 mg Oral Daily  . PARoxetine  20 mg Oral Daily  . sodium chloride flush  10-40 mL Intracatheter Q12H  . vancomycin  125 mg Oral QID   Continuous Infusions: . cefTRIAXone (ROCEPHIN)  IV 2 g (05/21/20 0000)  . lactated ringers 10 mL/hr at 05/20/20  1152  . metronidazole 500 mg (05/21/20 0031)  Aline August, MD [facility] 05/21/2020, 7:37 AM

## 2020-05-21 NOTE — Progress Notes (Addendum)
  Progress Note    05/21/2020 8:57 AM 1 Day Post-Op  Subjective:  Says her stomach feels almost okay.  All she wants is some rice krispies and milk.   afebrile  Vitals:   05/20/20 2016 05/21/20 0457  BP: (!) 147/61 (!) 156/67  Pulse: 72 72  Resp: 18 16  Temp: 98.7 F (37.1 C) 98 F (36.7 C)  SpO2: 99% 98%    Physical Exam: General:  No distress Lungs:  Non labored Incisions:  Midline incision is clean and dry Extremities:  Bilateral feet are warm and well perfused Abdomen:  Soft, NT/ND  CBC    Component Value Date/Time   WBC 5.6 05/21/2020 0323   RBC 2.77 (L) 05/21/2020 0323   HGB 8.4 (L) 05/21/2020 0323   HCT 25.0 (L) 05/21/2020 0323   PLT 116 (L) 05/21/2020 0323   MCV 90.3 05/21/2020 0323   MCH 30.3 05/21/2020 0323   MCHC 33.6 05/21/2020 0323   RDW 15.9 (H) 05/21/2020 0323   LYMPHSABS 0.8 04/26/2020 0830   MONOABS 0.5 04/26/2020 0830   EOSABS 0.3 04/26/2020 0830   BASOSABS 0.0 04/26/2020 0830    BMET    Component Value Date/Time   NA 132 (L) 05/21/2020 0323   K 3.8 05/21/2020 0323   CL 99 05/21/2020 0323   CO2 23 05/21/2020 0323   GLUCOSE 86 05/21/2020 0323   BUN 7 (L) 05/21/2020 0323   CREATININE 0.78 05/21/2020 0323   CALCIUM 7.6 (L) 05/21/2020 0323   GFRNONAA >60 05/21/2020 0323   GFRAA >60 05/21/2020 0323    INR    Component Value Date/Time   INR 1.5 (H) 04/18/2020 1648     Intake/Output Summary (Last 24 hours) at 05/21/2020 0857 Last data filed at 05/21/2020 0600 Gross per 24 hour  Intake 960 ml  Output --  Net 960 ml     Assessment:  69 y.o. female is s/p:  aortobifem 6/17 secondary to LE ischemia with rest pain. Admitted secondary to ischemic colitis, anemia  1 Day Post-Op  Plan: -endoscopy yesterday revealed - Congested, erythematous, ulcerated and vascular-pattern-decreased mucosa in the proximal rectum, in the mid rectum, in the distal rectum, in the recto-sigmoid colon and in the sigmoid colon. Biopsied. - Stricture in the  distal sigmoid colon. - The examination was otherwise normal. - Overall appearance most consistent with ischemic colitis, perhaps slightly improving; no evidence of mucosal gangrenous changes.  -pt symptoms improved this am and wanting more solid diet. Abdomen is soft.  Staples removed yesterday and incision looks good.  -hgb stable this morning. -Dr. Donzetta Matters to see later this morning.   Leontine Locket, PA-C [facility] [phone] 05/21/2020 8:57 AM  I have independently interviewed and examined patient and agree with PA assessment and plan above.  Overall appears to be doing much better having appetite this morning.  Appreciate GI evaluation and hospitalist care of this patient.  Dry dressings placed in both groins does have some fibrinous exudate on the left.  Jennyfer Nickolson C. Donzetta Matters, MD [facility] [place] Office: [phone] Pager: [phone]

## 2020-05-21 NOTE — TOC Initial Note (Signed)
Transition of Care [facility]) - Initial/Assessment Note    Patient Details  Name: Tonya Hughes MRN: [id number] Date of Birth: Jul 03, 1951  [facility] CM/SW Contact:    Marilu Favre, RN Phone Number: 05/21/2020, 11:42 AM  Clinical Narrative:                 Patient from home with husband and son. Patient has walker, bedside commode and shower chair at home already.   Patient had referral to [facility] for HHRN,PT,OT prior to admission, however when they called patient for visit, she was not feeling well and then admitted to hospital. Patient wants to continue with [place] with [facility] aware , orders placed.   Expected Discharge Plan: [place] Barriers to Discharge: Continued Medical Work up   Patient Goals and CMS Choice Patient states their goals for this hospitalization and ongoing recovery are:: to return to home CMS Medicare.gov Compare Post Acute Care list provided to:: Patient Choice offered to / list presented to : Patient  Expected Discharge Plan and Services Expected Discharge Plan: [place]   Discharge Planning Services: CM Consult Post Acute Care Choice: [place] arrangements for the past 2 months: Single Family Home                 DME Arranged: N/A DME Agency: NA       HH Arranged: RN, Disease Management, PT, OT [place] Agency: [place]) Date HH Agency Contacted: 05/21/20 Time [place]: [place] Representative spoke with at [place]: Butch Penny  Prior Living Arrangements/Services Living arrangements for the past 2 months: [place] Lives with:: Adult Children, Spouse Patient language and need for interpreter reviewed:: Yes        Need for Family Participation in Patient Care: Yes (Comment) Care giver support system in place?: Yes (comment) Current home services: DME Criminal Activity/Legal Involvement Pertinent to Current  Situation/Hospitalization: No - Comment as needed  Activities of Daily Living      Permission Sought/Granted   Permission granted to share information with : No              Emotional Assessment Appearance:: Appears stated age Attitude/Demeanor/Rapport: Engaged Affect (typically observed): Accepting Orientation: : Oriented to Self, Oriented to Place, Oriented to  Time, Oriented to Situation Alcohol / Substance Use: Not Applicable Psych Involvement: No (comment)  Admission diagnosis:  Ischemic colitis ([place]) [K55.9] Abdominal pain [R10.9] Patient Active Problem List   Diagnosis Date Noted  . Abdominal pain 05/17/2020  . Hyponatremia 05/17/2020  . Diarrhea 05/17/2020  . C. difficile colitis 05/17/2020  . Hemorrhagic shock ([place])   . Peripheral arterial occlusive disease ([place]) 04/18/2020  . Shock ([place]) 04/18/2020  . Hypertension   . Hypercholesteremia   . GERD (gastroesophageal reflux disease)   . Depressive disorder   . Congestive heart failure (CHF) ([place])   . Back pain   . Acute deep vein thrombosis (DVT) of proximal vein of right lower extremity ([place]) 08/27/2017  . Closed fracture of lumbar vertebra ([place]) 05/31/2017  . S/P lumbar fusion 04/21/2017  . Lumbar radiculopathy 02/25/2017   PCP:  Monico Blitz, MD Pharmacy:   [facility] ([place], [address] [place] [address] Phone: [phone] Fax: [phone]     Social Determinants of Health (SDOH) Interventions    Readmission Risk Interventions No  flowsheet data found.

## 2020-05-21 NOTE — Plan of Care (Signed)
Sigmoidoscopic biopsies pending, but mucosal appearance most consistent with ischemic colitis now with stricture; moderate to severe in appearance, but better than appearance last month.  Suggest:  -Continue supportive care. -Advance diet as tolerated, but would likely not advance past soft mechanical diet for the next couple weeks. -[facility] will follow along at a distance.

## 2020-05-21 NOTE — Progress Notes (Signed)
Physical Therapy Treatment Patient Details Name: Tonya Hughes MRN: [id number] DOB: May 14, 1951 Today's Date: 05/21/2020    History of Present Illness Pt is a 69 year old woman admitted one day after d/c from SNF with abdominal pain and diarrhea, hyponatremia and AKI. Possible ischemic colitis. Pt discharged from [facility] on 04/29/20 after admission for aortobifemoral BPG with complication of sigmoid ischemia. PMH: CHF, COPD, MDD, HTN, HLD.      PT Comments    Pt supine in bed on arrival. Lights off in room and eyes closed.  Pt reports she is in too much pain to move out of bed but able to encourage LE exercises in bed.  Plan to progress OOB next session if patient is willing.     Follow Up Recommendations  Home health PT;Supervision/Assistance - 24 hour     Equipment Recommendations  None recommended by PT    Recommendations for Other Services Rehab consult     Precautions / Restrictions Precautions Precautions: Fall    Mobility  Bed Mobility                  Transfers                    Ambulation/Gait                 Stairs             Wheelchair Mobility    Modified Rankin (Stroke Patients Only)       Balance                                            Cognition Arousal/Alertness: Awake/alert Behavior During Therapy: Flat affect;Anxious;Agitated Overall Cognitive Status: Impaired/Different from baseline Area of Impairment: Attention;Memory;Following commands;Safety/judgement;Awareness;Problem solving                 Orientation Level: Time Current Attention Level: Sustained Memory: Decreased short-term memory Following Commands: Follows one step commands with increased time Safety/Judgement: Decreased awareness of safety;Decreased awareness of deficits Awareness: Intellectual Problem Solving: Slow processing;Decreased initiation;Requires verbal cues;Requires tactile cues General Comments: Pt only receptive  to LE exercises and refused OOB activity.      Exercises General Exercises - Lower Extremity Ankle Circles/Pumps: AROM;Both;10 reps;Supine;Limitations Ankle Circles/Pumps Limitations: AAROM on L side Quad Sets: AROM;Both;10 reps;Supine Heel Slides: AROM;Both;10 reps;Supine;Limitations Heel Slides Limitations: ER of hip noted on R side. Hip ABduction/ADduction: AROM;Both;10 reps;Supine Straight Leg Raises: AROM;Both;10 reps;Supine;Limitations Straight Leg Raises Limitations: B extensor lag noted.    General Comments        Pertinent Vitals/Pain Pain Assessment: 0-10 Pain Score: 6  Pain Location: abdomen Pain Descriptors / Indicators: Sore;Aching;Discomfort;Guarding Pain Intervention(s): Monitored during session;Repositioned    Home Living                      Prior Function            PT Goals (current goals can now be found in the care plan section) Acute Rehab PT Goals Patient Stated Goal: eat and drink Potential to Achieve Goals: Fair Progress towards PT goals: Progressing toward goals    Frequency    Min 3X/week      PT Plan Current plan remains appropriate    Co-evaluation              AM-PAC PT "6 Clicks" Mobility  Outcome Measure  Help needed turning from your back to your side while in a flat bed without using bedrails?: None Help needed moving from lying on your back to sitting on the side of a flat bed without using bedrails?: A Little Help needed moving to and from a bed to a chair (including a wheelchair)?: A Little Help needed standing up from a chair using your arms (e.g., wheelchair or bedside chair)?: A Little Help needed to walk in hospital room?: A Little Help needed climbing 3-5 steps with a railing? : A Lot 6 Click Score: 18    End of Session Equipment Utilized During Treatment: Gait belt Activity Tolerance: Patient tolerated treatment well Patient left: in chair;with call bell/phone within reach;with chair alarm  set Nurse Communication: Mobility status PT Visit Diagnosis: Other abnormalities of gait and mobility (R26.89);Difficulty in walking, not elsewhere classified (R26.2);Muscle weakness (generalized) (M62.81);Other symptoms and signs involving the nervous system (R29.898);Pain Pain - Right/Left: Left Pain - part of body: Leg     Time: 4628-6381 PT Time Calculation (min) (ACUTE ONLY): 10 min  Charges:  $Therapeutic Exercise: 8-22 mins                     Erasmo Leventhal , PTA Acute Rehabilitation Services Pager [phone] Office [phone]     Taggert Bozzi Eli Hose 05/21/2020, 6:38 PM

## 2020-05-22 ENCOUNTER — Encounter (HOSPITAL_COMMUNITY): Payer: Self-pay | Admitting: Gastroenterology

## 2020-05-22 DIAGNOSIS — K559 Vascular disorder of intestine, unspecified: Secondary | ICD-10-CM

## 2020-05-22 DIAGNOSIS — I10 Essential (primary) hypertension: Secondary | ICD-10-CM | POA: Diagnosis not present

## 2020-05-22 DIAGNOSIS — A0472 Enterocolitis due to Clostridium difficile, not specified as recurrent: Principal | ICD-10-CM

## 2020-05-22 DIAGNOSIS — I779 Disorder of arteries and arterioles, unspecified: Secondary | ICD-10-CM | POA: Diagnosis not present

## 2020-05-22 LAB — CBC WITH DIFFERENTIAL/PLATELET
Abs Immature Granulocytes: 0.06 10*3/uL (ref 0.00–0.07)
Basophils Absolute: 0 10*3/uL (ref 0.0–0.1)
Basophils Relative: 1 %
Eosinophils Absolute: 0.1 10*3/uL (ref 0.0–0.5)
Eosinophils Relative: 2 %
HCT: 29 % — ABNORMAL LOW (ref 36.0–46.0)
Hemoglobin: 9.1 g/dL — ABNORMAL LOW (ref 12.0–15.0)
Immature Granulocytes: 1 %
Lymphocytes Relative: 36 %
Lymphs Abs: 2.2 10*3/uL (ref 0.7–4.0)
MCH: 29 pg (ref 26.0–34.0)
MCHC: 31.4 g/dL (ref 30.0–36.0)
MCV: 92.4 fL (ref 80.0–100.0)
Monocytes Absolute: 0.4 10*3/uL (ref 0.1–1.0)
Monocytes Relative: 7 %
Neutro Abs: 3.3 10*3/uL (ref 1.7–7.7)
Neutrophils Relative %: 53 %
Platelets: 250 10*3/uL (ref 150–400)
RBC: 3.14 MIL/uL — ABNORMAL LOW (ref 3.87–5.11)
RDW: 16.9 % — ABNORMAL HIGH (ref 11.5–15.5)
WBC: 6.1 10*3/uL (ref 4.0–10.5)
nRBC: 0 % (ref 0.0–0.2)

## 2020-05-22 LAB — BASIC METABOLIC PANEL
Anion gap: 8 (ref 5–15)
BUN: <5 mg/dL — ABNORMAL LOW (ref 8–23)
CO2: 26 mmol/L (ref 22–32)
Calcium: 7.9 mg/dL — ABNORMAL LOW (ref 8.9–10.3)
Chloride: 100 mmol/L (ref 98–111)
Creatinine, Ser: 0.81 mg/dL (ref 0.44–1.00)
GFR calc Af Amer: >60 mL/min (ref >60)
GFR calc non Af Amer: >60 mL/min (ref >60)
Glucose, Bld: 92 mg/dL (ref 70–99)
Potassium: 3.5 mmol/L (ref 3.5–5.1)
Sodium: 134 mmol/L — ABNORMAL LOW (ref 135–145)

## 2020-05-22 LAB — GLUCOSE, CAPILLARY
Glucose-Capillary: 99 mg/dL (ref 70–99)
Glucose-Capillary: 91 mg/dL (ref 70–99)
Glucose-Capillary: 122 mg/dL — ABNORMAL HIGH (ref 70–99)
Glucose-Capillary: 97 mg/dL (ref 70–99)
Glucose-Capillary: 100 mg/dL — ABNORMAL HIGH (ref 70–99)

## 2020-05-22 LAB — MAGNESIUM: Magnesium: 1.7 mg/dL (ref 1.7–2.4)

## 2020-05-22 NOTE — Progress Notes (Signed)
Physical Therapy Treatment Patient Details Name: Tonya Hughes MRN: [id number] DOB: 11/10/1950 Today's Date: 05/22/2020    History of Present Illness Pt is a 69 year old woman admitted one day after d/c from SNF with abdominal pain and diarrhea, hyponatremia and AKI. Possible ischemic colitis. Pt discharged from [facility] on 04/29/20 after admission for aortobifemoral BPG with complication of sigmoid ischemia. PMH: CHF, COPD, MDD, HTN, HLD.      PT Comments    Pt supine in bed required max cues for encouragement to participate in OOB ambulation.  Pt required assistance to move from bed to door and back to bed.  Continue to recommend HHPT with assistance.    Follow Up Recommendations  Home health PT;Supervision/Assistance - 24 hour     Equipment Recommendations  None recommended by PT    Recommendations for Other Services Rehab consult     Precautions / Restrictions Precautions Precautions: Fall Precaution Comments: pressure wound on left heel Restrictions Weight Bearing Restrictions: No    Mobility  Bed Mobility Overal bed mobility: Modified Independent Bed Mobility: Supine to Sit;Sit to Supine Rolling: Modified independent (Device/Increase time)   Supine to sit: Min assist;HOB elevated Sit to supine: Min assist   General bed mobility comments: No assistance.  Transfers Overall transfer level: Needs assistance Equipment used: Rolling walker (2 wheeled) Transfers: Sit to/from [facility] Sit to Stand: Min guard Stand pivot transfers: Min assist       General transfer comment: Min guard to rise into standing.  Ambulation/Gait Ambulation/Gait assistance: Min guard Gait Distance (Feet): 12 Feet Assistive device: Rolling walker (2 wheeled) Gait Pattern/deviations: Step-through pattern;Decreased stride length;Trunk flexed;Steppage     General Gait Details: Pt with decreased weight bearing on L foot ambulating on toe.  Pt required encouragement to walk to  door and back.   Stairs             Wheelchair Mobility    Modified Rankin (Stroke Patients Only)       Balance Overall balance assessment: Needs assistance Sitting-balance support: Feet supported;No upper extremity supported Sitting balance-Leahy Scale: Good Sitting balance - Comments: no LOB     Standing balance-Leahy Scale: Poor Standing balance comment: Hand held assist                            Cognition Arousal/Alertness: Awake/alert Behavior During Therapy: Flat affect;Anxious;Agitated Overall Cognitive Status: Impaired/Different from baseline Area of Impairment: Attention;Memory;Following commands;Safety/judgement;Awareness;Problem solving                 Orientation Level:  (Pt refused to answer orientation questions. ) Current Attention Level: Sustained Memory: Decreased short-term memory Following Commands: Follows one step commands with increased time Safety/Judgement: Decreased awareness of safety;Decreased awareness of deficits Awareness: Intellectual Problem Solving: Slow processing;Decreased initiation;Requires verbal cues;Requires tactile cues General Comments: Pt. required increased coaxing/encouragement for participation with OOB mobility. Pt. very agitated in speech cursing multiple times at this therapist.      Exercises      General Comments General comments (skin integrity, edema, etc.): Redness on buttocks. NT aware.       Pertinent Vitals/Pain Pain Assessment: 0-10 Pain Score: 6  Faces Pain Scale: Hurts little more Pain Location: abdomen/buttocks Pain Descriptors / Indicators: Sore;Aching;Discomfort;Guarding Pain Intervention(s): Monitored during session;Repositioned    Home Living                      Prior Function  PT Goals (current goals can now be found in the care plan section) Acute Rehab PT Goals Patient Stated Goal: No goals stated.  Potential to Achieve Goals: Fair Progress  towards PT goals: Progressing toward goals    Frequency    Min 3X/week      PT Plan Current plan remains appropriate    Co-evaluation              AM-PAC PT "6 Clicks" Mobility   Outcome Measure  Help needed turning from your back to your side while in a flat bed without using bedrails?: None Help needed moving from lying on your back to sitting on the side of a flat bed without using bedrails?: A Little Help needed moving to and from a bed to a chair (including a wheelchair)?: A Little Help needed standing up from a chair using your arms (e.g., wheelchair or bedside chair)?: A Little Help needed to walk in hospital room?: A Little Help needed climbing 3-5 steps with a railing? : A Lot 6 Click Score: 18    End of Session Equipment Utilized During Treatment: Gait belt Activity Tolerance: Patient tolerated treatment well Patient left: with chair alarm set;in bed;with bed alarm set Nurse Communication: Mobility status PT Visit Diagnosis: Other abnormalities of gait and mobility (R26.89);Difficulty in walking, not elsewhere classified (R26.2);Muscle weakness (generalized) (M62.81);Other symptoms and signs involving the nervous system (R29.898);Pain Pain - Right/Left: Left Pain - part of body: Leg     Time: 2527-1292 PT Time Calculation (min) (ACUTE ONLY): 17 min  Charges:  $Gait Training: 8-22 mins                     Erasmo Leventhal , PTA Acute Rehabilitation Services Pager [phone] Office [phone]     Emanuel Campos Eli Hose 05/22/2020, 5:45 PM

## 2020-05-22 NOTE — Progress Notes (Signed)
  Progress Note    05/22/2020 11:15 AM 2 Days Post-Op  Subjective:  No major complaints   Vitals:   05/21/20 2013 05/22/20 0343  BP: (!) 150/49 (!) 137/58  Pulse: 65 64  Resp: 18 14  Temp: 97.8 F (36.6 C) 98.1 F (36.7 C)  SpO2: 98% 98%   Physical Exam: Cardiac:  regular Lungs: non labored Incisions:  Bilateral groin incisions with mild dehiscense with fibrinous exudate present. Staples removed and dry dressings applied Extremities: moving all extremities. Palpable DP bilaterally Abdomen: obese, soft, expected mild tenderness. Midline incision intact clean and dry Neurologic: alert and oriented  CBC    Component Value Date/Time   WBC 6.1 05/22/2020 0834   RBC 3.14 (L) 05/22/2020 0834   HGB 9.1 (L) 05/22/2020 0834   HCT 29.0 (L) 05/22/2020 0834   PLT 250 05/22/2020 0834   MCV 92.4 05/22/2020 0834   MCH 29.0 05/22/2020 0834   MCHC 31.4 05/22/2020 0834   RDW 16.9 (H) 05/22/2020 0834   LYMPHSABS 2.2 05/22/2020 0834   MONOABS 0.4 05/22/2020 0834   EOSABS 0.1 05/22/2020 0834   BASOSABS 0.0 05/22/2020 0834    BMET    Component Value Date/Time   NA 134 (L) 05/22/2020 0834   K 3.5 05/22/2020 0834   CL 100 05/22/2020 0834   CO2 26 05/22/2020 0834   GLUCOSE 92 05/22/2020 0834   BUN <5 (L) 05/22/2020 0834   CREATININE 0.81 05/22/2020 0834   CALCIUM 7.9 (L) 05/22/2020 0834   GFRNONAA >60 05/22/2020 0834   GFRAA >60 05/22/2020 0834    INR    Component Value Date/Time   INR 1.5 (H) 04/18/2020 1648     Intake/Output Summary (Last 24 hours) at 05/22/2020 1115 Last data filed at 05/21/2020 1700 Gross per 24 hour  Intake 1105.45 ml  Output --  Net 1105.45 ml     Assessment/Plan:  69 y.o. female is s/p aortobifemoral bypass readmitted with ongoing ischemic colitis, c. Difficile and failure to thrive. Staples removed from bilateral groins. Keep bilateral groins dry with dressings. Will need to closely monitor due to some dehiscence of bilateral incisions.  Fibrinous tissue present in wounds otherwise without any signs of infection. Mobilize as tolerated  Karoline Caldwell, PA-C [facility] [phone] 05/22/2020 11:15 AM

## 2020-05-22 NOTE — Progress Notes (Signed)
   05/22/20 1000  Notify: Provider  Provider Name/Title [place] [facility]  Date Provider Notified 05/22/20  Time Provider Notified 1000  Notification Type Call  Notification Reason Other (Comment) (refusing Vanc liquid switch to PO Pill?)  Response Other (Comment) (pt needs to cont to try liquid)  Date of Provider Response 05/22/20  Time of Provider Response 1000  pt cont to refuse citing stomach upset w liquid vancomycin. Declined offer of premedicating w zofran or any other intervention that would ease administration

## 2020-05-22 NOTE — Progress Notes (Signed)
  Progress Note    05/22/2020 7:58 AM 2 Days Post-Op  Subjective: Still having mild abdominal pain  Vitals:   05/21/20 2013 05/22/20 0343  BP: (!) 150/49 (!) 137/58  Pulse: 65 64  Resp: 18 14  Temp: 97.8 F (36.6 C) 98.1 F (36.7 C)  SpO2: 98% 98%    Physical Exam:  Awake alert oriented Abdomen is soft with mild tenderness to palpation, staples have been removed Bilateral groins with approximately 2 cm of fibrinous exudate staples are in place Palpable dorsalis pedis pulses bilaterally  CBC    Component Value Date/Time   WBC 5.6 05/21/2020 0323   RBC 2.77 (L) 05/21/2020 0323   HGB 8.4 (L) 05/21/2020 0323   HCT 25.0 (L) 05/21/2020 0323   PLT 116 (L) 05/21/2020 0323   MCV 90.3 05/21/2020 0323   MCH 30.3 05/21/2020 0323   MCHC 33.6 05/21/2020 0323   RDW 15.9 (H) 05/21/2020 0323   LYMPHSABS 0.8 04/26/2020 0830   MONOABS 0.5 04/26/2020 0830   EOSABS 0.3 04/26/2020 0830   BASOSABS 0.0 04/26/2020 0830    BMET    Component Value Date/Time   NA 132 (L) 05/21/2020 0323   K 3.8 05/21/2020 0323   CL 99 05/21/2020 0323   CO2 23 05/21/2020 0323   GLUCOSE 86 05/21/2020 0323   BUN 7 (L) 05/21/2020 0323   CREATININE 0.78 05/21/2020 0323   CALCIUM 7.6 (L) 05/21/2020 0323   GFRNONAA >60 05/21/2020 0323   GFRAA >60 05/21/2020 0323    INR    Component Value Date/Time   INR 1.5 (H) 04/18/2020 1648     Intake/Output Summary (Last 24 hours) at 05/22/2020 0758 Last data filed at 05/21/2020 1700 Gross per 24 hour  Intake 1465.45 ml  Output --  Net 1465.45 ml     Assessment/plan:  69 y.o. female is s/p aortobifemoral bypass now readmitted with ongoing ischemic colitis, C. difficile and failure to thrive.  Still having difficulty taking p.o.  We will need to remove some staples from her groins today keeping groins dry with dressings throughout the day.  She is okay for activity from vascular standpoint.     Edrik Rundle C. Donzetta Matters, MD [facility]  [place] Office: [phone] Pager: [phone]  05/22/2020 7:58 AM

## 2020-05-22 NOTE — Progress Notes (Signed)
PROGRESS NOTE  MERCEDE ROLLO OZH:[id number] DOB: 03-29-1951 DOA: 05/17/2020 PCP: Monico Blitz, MD  HPI/Recap of past 35 hours: 69 year old female with past medical history of PAD status post aorto bifemoral bypass on 6/29+ chronic diastolic heart failure and MDD admitted on 7/16 after presenting with abdominal pain and felt to have ischemic colitis.  Vascular surgery consulted.  In the emergency room, patient noted to have positive C. difficile antigen, but negative for toxin although she had diarrhea.  Her PCR however was positive.  CT of the abdomen pelvis noted colitis of the distal descending colon and rectosigmoid with improvement of inflammatory changes of splenic flexure seen on prior CT.  Patient started on IV antibiotics and oral vancomycin.  She also subsequently developed rectal bleeding with a drop in her hemoglobin requiring 2 units packed red blood cells.  GI was consulted and patient underwent flexible sigmoidoscopy on 7/19.  Sigmoidoscopy pathology still pending, but mucosal appearance consistent with ischemic colitis now with stricture although improved from previous month.  Today, patient seen by vascular surgery and had staples removed.  She is quite nauseated with some mild abdominal discomfort.  She is not been able to tolerate the oral vancomycin and requested to be switched to IV.  Assessment/Plan: Possible ischemic colitis: Confirmed by flexible sigmoidoscopy vascular surgery following and recommend antibiotic treatment.  Continue Rocephin and Flagyl.   -Pain management.  Rectal bleeding Acute blood loss anemia on top of anemia of chronic disease -Patient started to have rectal bleeding from 05/18/2020 onwards. -Hemoglobin dropped to 6.2 on 05/19/2020.  Status post 2 units packed red cells transfusion on 05/19/2020.  Hemoglobin 8.4 this morning.  Aspirin and prophylactic heparin were discontinued on 05/18/2020.  -GI following.  Status post flexible sigmoidoscopy on 05/20/2020  showing findings most consistent with ischemic colitis with no evidence of mucosal gangrenous changes; await pathology.  Follow further GI recommendations. -Advance diet to soft diet today.  Probable C. difficile colitis -C. difficile testing was positive for antigen but negative for toxin but PCR was positive.  Patient symptomatic with diarrhea. -Has been started on vancomycin orally and since changed over to IV on 7/21 which will be continued for 10 days.  Hyponatremia -Sodium 128 on presentation.    Now normalized..    Treated with IV fluids.Hypokalemia-improved.  Repeat a.m. labs  Acute kidney injury on chronic kidney disease stage II: Creatinine 1.3 on presentation.   Now appears to be back at baseline.  GFR greater than 60Hypertension  -monitor blood pressure.  Antihypertensives including Lasix/lisinopril and Coreg on hold since admission.  Blood pressure now in the 140s to 150s.  We will go ahead and restart  Hyperlipidemia: Continue Lipitor  Major depressive disorder: Continue Paxil  COPD: Continue current regimen  Overweight: Meets criteria for BMI greater than 25  Generalized deconditioning: PT recommends home health PT  Code Status: Full code  Family Communication: Left message for daughter  Disposition Plan: Home with home health PT once completed IV antibiotics   Consultants:  Vascular surgery  Gastroenterology  Procedures:  Status post flexible sigmoidoscopy done 7/19 with signs consistent with ischemic colitis  Antimicrobials:  IV Rocephin and Flagyl 7/16-present  Oral vancomycin 7/16-7/21  IV vancomycin 7/21-present  DVT prophylaxis: Initially heparin, discontinued on 7/17 SCDs   Objective: Vitals:   05/22/20 0343 05/22/20 1510  BP: (!) 137/58 (!) 151/62  Pulse: 64 67  Resp: 14 15  Temp: 98.1 F (36.7 C) 98.6 F (37 C)  SpO2: 98% 100%  Intake/Output Summary (Last 24 hours) at 05/22/2020 2037 Last data filed at 05/22/2020  1700 Gross per 24 hour  Intake 777 ml  Output --  Net 777 ml   Filed Weights   05/19/20 0423  Weight: 73.4 kg   Body mass index is 28.66 kg/m.  Exam:   General: Alert and oriented x3, mild distress secondary to nausea and abdominal discomfort  Cardiovascular: Regular rate and rhythm, S1-S2  Respiratory: Clear to auscultation bilaterally  Abdomen: Soft, nontender, nondistended, hypoactive bowel sounds  Musculoskeletal: No clubbing or cyanosis, trace pitting edema  Skin: No skin breaks, tears or lesions  Psychiatry: Appropriate, no evidence of psychoses   Data Reviewed: CBC: Recent Labs  Lab 05/18/20 0505 05/18/20 1633 05/19/20 0608 05/19/20 2324 05/20/20 0648 05/21/20 0323 05/22/20 0834  WBC 6.5  --  6.0  --  6.3 5.6 6.1  NEUTROABS  --   --   --   --   --   --  3.3  HGB 9.2*   < > 6.2* 8.6* 9.1* 8.4* 9.1*  HCT 28.0*   < > 19.6* 25.5* 26.9* 25.0* 29.0*  MCV 88.9  --  90.7  --  89.7 90.3 92.4  PLT 240  --  215  --  235 116* 250   < > = values in this interval not displayed.   Basic Metabolic Panel: Recent Labs  Lab 05/18/20 0505 05/19/20 0608 05/20/20 0648 05/21/20 0323 05/22/20 0834  NA 132* 133* 134* 132* 134*  K 3.3* 4.2 3.9 3.8 3.5  CL 91* 97* 100 99 100  CO2 29 26 26 23 26   GLUCOSE 94 115* 94 86 92  BUN 15 13 8  7* <5*  CREATININE 1.11* 1.02* 0.80 0.78 0.81  CALCIUM 7.9* 7.7* 7.7* 7.6* 7.9*  MG 1.8 1.7  --   --  1.7  PHOS 4.5  --   --   --   --    GFR: Estimated Creatinine Clearance: 64.7 mL/min (by C-G formula based on SCr of 0.81 mg/dL). Liver Function Tests: Recent Labs  Lab 05/17/20 1518  AST 29  ALT 15  ALKPHOS 66  BILITOT 0.9  PROT 7.4  ALBUMIN 2.6*   Recent Labs  Lab 05/17/20 1518  LIPASE 28   No results for input(s): AMMONIA in the last 168 hours. Coagulation Profile: No results for input(s): INR, PROTIME in the last 168 hours. Cardiac Enzymes: No results for input(s): CKTOTAL, CKMB, CKMBINDEX, TROPONINI in the last  168 hours. BNP (last 3 results) No results for input(s): PROBNP in the last 8760 hours. HbA1C: No results for input(s): HGBA1C in the last 72 hours. CBG: Recent Labs  Lab 05/21/20 2333 05/22/20 0344 05/22/20 0805 05/22/20 1205 05/22/20 1717  GLUCAP 113* 99 91 122* 97   Lipid Profile: No results for input(s): CHOL, HDL, LDLCALC, TRIG, CHOLHDL, LDLDIRECT in the last 72 hours. Thyroid Function Tests: No results for input(s): TSH, T4TOTAL, FREET4, T3FREE, THYROIDAB in the last 72 hours. Anemia Panel: No results for input(s): VITAMINB12, FOLATE, FERRITIN, TIBC, IRON, RETICCTPCT in the last 72 hours. Urine analysis:    Component Value Date/Time   COLORURINE YELLOW 04/15/2020 1505   APPEARANCEUR CLEAR 04/15/2020 1505   LABSPEC 1.011 04/15/2020 1505   PHURINE 6.0 04/15/2020 1505   GLUCOSEU NEGATIVE 04/15/2020 1505   HGBUR SMALL (A) 04/15/2020 1505   BILIRUBINUR NEGATIVE 04/15/2020 1505   KETONESUR NEGATIVE 04/15/2020 1505   PROTEINUR NEGATIVE 04/15/2020 1505   NITRITE NEGATIVE 04/15/2020 1505   LEUKOCYTESUR NEGATIVE  04/15/2020 1505   Sepsis Labs: @LABRCNTIP (procalcitonin:4,lacticidven:4)  ) Recent Results (from the past 240 hour(s))  C Difficile Quick Screen w PCR reflex     Status: Abnormal   Collection Time: 05/17/20  7:49 PM   Specimen: STOOL  Result Value Ref Range Status   C Diff antigen POSITIVE (A) NEGATIVE Final   C Diff toxin NEGATIVE NEGATIVE Final   C Diff interpretation Results are indeterminate. See PCR results.  Final    Comment: Performed at [facility], [place] [address] [place], [place] [address]  C. Diff by PCR, Reflexed     Status: Abnormal   Collection Time: 05/17/20  7:49 PM  Result Value Ref Range Status   Toxigenic C. Difficile by PCR POSITIVE (A) NEGATIVE Final    Comment: Positive for toxigenic C. difficile with little to no toxin production. Only treat if clinical presentation suggests symptomatic illness. Performed at [facility], [place] [address] [place], [place] [address]   SARS Coronavirus 2 by RT PCR (hospital order, performed in [facility] hospital lab) Nasopharyngeal Nasopharyngeal Swab     Status: None   Collection Time: 05/18/20 12:16 AM   Specimen: Nasopharyngeal Swab  Result Value Ref Range Status   SARS Coronavirus 2 NEGATIVE NEGATIVE Final    Comment: (NOTE) SARS-CoV-2 target nucleic acids are NOT DETECTED.  The SARS-CoV-2 RNA is generally detectable in upper and lower respiratory specimens during the acute phase of infection. The lowest concentration of SARS-CoV-2 viral copies this assay can detect is 250 copies / mL. A negative result does not preclude SARS-CoV-2 infection and should not be used as the sole basis for treatment or other patient management decisions.  A negative result may occur with improper specimen collection / handling, submission of specimen other than nasopharyngeal swab, presence of viral mutation(s) within the areas targeted by this assay, and inadequate number of viral copies (<250 copies / mL). A negative result must be combined with clinical observations, patient history, and epidemiological information.  Fact Sheet for Patients:   StrictlyIdeas.no  Fact Sheet for Healthcare Providers: BankingDealers.co.za  This test is not yet approved or  cleared by the [place] FDA and has been authorized for detection and/or diagnosis of SARS-CoV-2 by FDA under an Emergency Use Authorization (EUA).  This EUA will remain in effect (meaning this test can be used) for the duration of the COVID-19 declaration under Section 564(b)(1) of the Act, 21 U.S.C. section 360bbb-3(b)(1), unless the authorization is terminated or revoked sooner.  Performed at [facility], [place] [address] [place], [place] [address]       Studies: No results found.  Scheduled Meds: . atorvastatin  40 mg Oral QHS  . carvedilol  3.125 mg Oral BID   . insulin aspart  0-9 Units Subcutaneous Q4H  . loratadine  10 mg Oral Daily  . PARoxetine  20 mg Oral Daily  . sodium chloride flush  10-40 mL Intracatheter Q12H  . vancomycin  125 mg Oral QID    Continuous Infusions: . cefTRIAXone (ROCEPHIN)  IV 2 g (05/21/20 2329)  . lactated ringers 10 mL/hr at 05/20/20 1152  . metronidazole 500 mg (05/22/20 1621)     LOS: 5 days     Annita Brod, MD [facility]   05/22/2020, 8:37 PM

## 2020-05-22 NOTE — Progress Notes (Signed)
Occupational Therapy Treatment Patient Details Name: JAELENE GARCIAGARCIA MRN: [id number] DOB: 1951-06-28 Today's Date: 05/22/2020    History of present illness Pt is a 69 year old woman admitted one day after d/c from SNF with abdominal pain and diarrhea, hyponatremia and AKI. Possible ischemic colitis. Pt discharged from [facility] on 04/29/20 after admission for aortobifemoral BPG with complication of sigmoid ischemia. PMH: CHF, COPD, MDD, HTN, HLD.     OT comments  Patient met lying supine in bed after episode of bowel incontinence. Increased coaxing/encouragement for participation this date with focus on self-care re-education, functional transfers, and OOB activity tolerance. Patient making progress toward goals requiring less assist with BADLs including UB bathing/dressing. Patient would benefit from continued acute OT services to address deficits in strength, functional mobility, activity tolerance, cognition, and safety awareness prior to return home with family.    Follow Up Recommendations  Home health OT;Supervision/Assistance - 24 hour    Equipment Recommendations  None recommended by OT    Recommendations for Other Services      Precautions / Restrictions Precautions Precautions: Fall Restrictions Weight Bearing Restrictions: No       Mobility Bed Mobility Overal bed mobility: Modified Independent Bed Mobility: Supine to Sit;Sit to Supine     Supine to sit: Min assist;HOB elevated Sit to supine: Min assist   General bed mobility comments: Min A at trunk for supine to EOB and Min A for return to supine at BLE.   Transfers Overall transfer level: Needs assistance Equipment used: 1 person hand held assist;Rolling walker (2 wheeled)   Sit to Stand: Min assist Stand pivot transfers: Min assist       General transfer comment: Min A for initial STS from EOB with pt. refusing use of RW. From [facility], pt. in agreement with use of RW for SPT back to EOB.     Balance Overall balance  assessment: Needs assistance Sitting-balance support: Feet supported;No upper extremity supported Sitting balance-Leahy Scale: Good       Standing balance-Leahy Scale: Poor Standing balance comment: Hand held assist                           ADL either performed or assessed with clinical judgement   ADL           Upper Body Bathing: Sitting;Minimal assistance Upper Body Bathing Details (indicate cue type and reason): Patient washed RUE/LUE seated on BSC. Refused washing of abdomen due to "all this mess they have here" despite education on staple removal yesterday.  Lower Body Bathing: Maximal assistance;Sit to/from stand Lower Body Bathing Details (indicate cue type and reason): Patient able to bathe bilateral upper legs but required assistance to bathe bilateral lower legs and buttcks. Able to bathe front perineal area in sitting.  Upper Body Dressing : Minimal assistance Upper Body Dressing Details (indicate cue type and reason): To don anterior hospital gown in sitting.  Lower Body Dressing: Maximal assistance;Sit to/from stand Lower Body Dressing Details (indicate cue type and reason): To don footwear seated EOB.  Toilet Transfer: Minimal Systems analyst Details (indicate cue type and reason): Min A for SPT to [facility] placed near foot of bed. Pt. declined use of RW stating "I hate that damn thing!" Toileting- Clothing Manipulation and Hygiene: Moderate assistance;Sitting/lateral lean;Sit to/from stand Toileting - Clothing Manipulation Details (indicate cue type and reason): Pt. able to wipe buttocks seated on BSC but required Mod A for thoroughness in standing.  Functional mobility during ADLs: Minimal assistance (Pt. declined use of RW for SPT to [facility] requiring Min A and HH) General ADL Comments: Pt. very agitated requiring increased coaxing/encouragement for hygiene/clothing management after episode of bowel incontinence in supine.       Vision       Perception     Praxis      Cognition Arousal/Alertness: Awake/alert Behavior During Therapy: Flat affect;Anxious;Agitated Overall Cognitive Status: Impaired/Different from baseline Area of Impairment: Attention;Memory;Following commands;Safety/judgement;Awareness;Problem solving                 Orientation Level:  (Pt refused to answer orientation questions. ) Current Attention Level: Sustained Memory: Decreased short-term memory Following Commands: Follows one step commands with increased time Safety/Judgement: Decreased awareness of safety;Decreased awareness of deficits Awareness: Intellectual Problem Solving: Slow processing;Decreased initiation;Requires verbal cues;Requires tactile cues General Comments: Pt. required increased coaxing/encouragement for participation with OOB self-care tasks. Pt. very agitated in speech cursing multiple times at this therapist.         Exercises     Shoulder Instructions       General Comments Redness on buttocks. NT aware.     Pertinent Vitals/ Pain       Pain Assessment: Faces Faces Pain Scale: Hurts little more Pain Location: abdomen/buttocks Pain Descriptors / Indicators: Sore;Aching;Discomfort;Guarding Pain Intervention(s): Monitored during session;Limited activity within patient's tolerance  Home Living                                          Prior Functioning/Environment              Frequency  Min 2X/week        Progress Toward Goals  OT Goals(current goals can now be found in the care plan section)  Progress towards OT goals: Progressing toward goals  Acute Rehab OT Goals Patient Stated Goal: No goals stated.  OT Goal Formulation: With patient Time For Goal Achievement: 06/01/20 Potential to Achieve Goals: Good ADL Goals Pt Will Perform Grooming: with min guard assist;standing Pt Will Perform Upper Body Bathing: with set-up;sitting Pt Will Perform Lower Body  Bathing: with min guard assist;sit to/from stand Pt Will Perform Upper Body Dressing: with set-up;sitting Pt Will Perform Lower Body Dressing: with min guard assist;sit to/from stand Pt Will Transfer to Toilet: with min guard assist;ambulating;bedside commode Pt Will Perform Toileting - Clothing Manipulation and hygiene: with min guard assist;sit to/from stand Additional ADL Goal #1: Pt will sustain attention to ADL/functional task >5 min with no more than min cues for redirection.  Plan Discharge plan remains appropriate    Co-evaluation                 AM-PAC OT "6 Clicks" Daily Activity     Outcome Measure   Help from another person eating meals?: None Help from another person taking care of personal grooming?: A Little Help from another person toileting, which includes using toliet, bedpan, or urinal?: A Lot Help from another person bathing (including washing, rinsing, drying)?: A Lot Help from another person to put on and taking off regular upper body clothing?: A Little Help from another person to put on and taking off regular lower body clothing?: A Lot 6 Click Score: 16    End of Session Equipment Utilized During Treatment: Gait belt;Rolling walker  OT Visit Diagnosis: Other abnormalities of gait and mobility (R26.89);Muscle weakness (generalized) (M62.81);Other symptoms  and signs involving cognitive function   Activity Tolerance Patient limited by fatigue;Patient limited by pain;Treatment limited secondary to agitation   Patient Left in bed;with call bell/phone within reach;with bed alarm set   Nurse Communication          Time: 5800-6349 OT Time Calculation (min): 23 min  Charges: OT General Charges $OT Visit: 1 Visit OT Treatments $Self Care/Home Management : 23-37 mins  Romelle Reiley H. OTR/L Supplemental OT, Department of rehab services [phone]   Mikayah Joy R H. 05/22/2020, 3:43 PM

## 2020-05-23 DIAGNOSIS — I5032 Chronic diastolic (congestive) heart failure: Secondary | ICD-10-CM

## 2020-05-23 DIAGNOSIS — I10 Essential (primary) hypertension: Secondary | ICD-10-CM | POA: Diagnosis not present

## 2020-05-23 DIAGNOSIS — K559 Vascular disorder of intestine, unspecified: Secondary | ICD-10-CM | POA: Diagnosis not present

## 2020-05-23 DIAGNOSIS — A0472 Enterocolitis due to Clostridium difficile, not specified as recurrent: Secondary | ICD-10-CM | POA: Diagnosis not present

## 2020-05-23 LAB — CBC
HCT: 26.5 % — ABNORMAL LOW (ref 36.0–46.0)
Hemoglobin: 8.5 g/dL — ABNORMAL LOW (ref 12.0–15.0)
MCH: 29.2 pg (ref 26.0–34.0)
MCHC: 32.1 g/dL (ref 30.0–36.0)
MCV: 91.1 fL (ref 80.0–100.0)
Platelets: 253 10*3/uL (ref 150–400)
RBC: 2.91 MIL/uL — ABNORMAL LOW (ref 3.87–5.11)
RDW: 16.9 % — ABNORMAL HIGH (ref 11.5–15.5)
WBC: 6.2 10*3/uL (ref 4.0–10.5)
nRBC: 0 % (ref 0.0–0.2)

## 2020-05-23 LAB — GLUCOSE, CAPILLARY
Glucose-Capillary: 89 mg/dL (ref 70–99)
Glucose-Capillary: 93 mg/dL (ref 70–99)

## 2020-05-23 MED ORDER — LISINOPRIL 5 MG PO TABS
5.0000 mg | ORAL_TABLET | Freq: Every day | ORAL | Status: DC
  Administered 2020-05-23 – 2020-05-30 (×8): 5 mg via ORAL
  Filled 2020-05-23 (×8): qty 1

## 2020-05-23 MED ORDER — FUROSEMIDE 40 MG PO TABS
40.0000 mg | ORAL_TABLET | Freq: Every day | ORAL | Status: DC
  Administered 2020-05-23 – 2020-05-26 (×4): 40 mg via ORAL
  Filled 2020-05-23 (×4): qty 1

## 2020-05-23 MED ORDER — COLLAGENASE 250 UNIT/GM EX OINT
TOPICAL_OINTMENT | Freq: Every day | CUTANEOUS | Status: DC
  Administered 2020-05-23: 1 via TOPICAL
  Filled 2020-05-23: qty 30

## 2020-05-23 MED ORDER — POTASSIUM CHLORIDE CRYS ER 20 MEQ PO TBCR
20.0000 meq | EXTENDED_RELEASE_TABLET | Freq: Every day | ORAL | Status: DC
  Administered 2020-05-23 – 2020-05-26 (×4): 20 meq via ORAL
  Filled 2020-05-23 (×4): qty 1

## 2020-05-23 NOTE — Progress Notes (Signed)
Occupational Therapy Treatment Patient Details Name: Tonya Hughes MRN: [id number] DOB: Jan 09, 1951 Today's Date: 05/23/2020    History of present illness Pt is a 69 year old woman admitted one day after d/c from SNF with abdominal pain and diarrhea, hyponatremia and AKI. Possible ischemic colitis. Pt discharged from [facility] on 04/29/20 after admission for aortobifemoral BPG with complication of sigmoid ischemia. PMH: CHF, COPD, MDD, HTN, HLD.     OT comments  Pt limited by constant diarrhea, but willing to work with OT on OOB and ADL. Pt does not like to sit in recliner, stating it hurts her hips. Performed seated grooming, toileting and LB dressing with set up to total assist. Pt is eager to go home.  Follow Up Recommendations  Home health OT;Supervision/Assistance - 24 hour    Equipment Recommendations  None recommended by OT    Recommendations for Other Services      Precautions / Restrictions Precautions Precautions: Fall Precaution Comments: pressure wound on left heel, c-diff diarrhea       Mobility Bed Mobility Overal bed mobility: Modified Independent                Transfers Overall transfer level: Needs assistance Equipment used: Rolling walker (2 wheeled) Transfers: Sit to/from Stand Sit to Stand: Min guard              Balance     Sitting balance-Leahy Scale: Good     Standing balance support: Bilateral upper extremity supported Standing balance-Leahy Scale: Poor                             ADL either performed or assessed with clinical judgement   ADL Overall ADL's : Needs assistance/impaired     Grooming: Wash/dry hands;Wash/dry face;Sitting;Set up               Lower Body Dressing: Moderate assistance;Sit to/from stand Lower Body Dressing Details (indicate cue type and reason): for depends Toilet Transfer: Minimal assistance;BSC;Ambulation;RW   Toileting- Clothing Manipulation and Hygiene: Total assistance;Sit to/from  stand Toileting - Clothing Manipulation Details (indicate cue type and reason): assisted to avoid pt coming in contact with diarrhea     Functional mobility during ADLs: Minimal assistance;Rolling walker       Vision       Perception     Praxis      Cognition Arousal/Alertness: Awake/alert Behavior During Therapy: Flat affect Overall Cognitive Status: Impaired/Different from baseline                         Following Commands: Follows one step commands with increased time     Problem Solving: Slow processing;Decreased initiation;Requires verbal cues;Requires tactile cues General Comments: pt readily willing to work with OT on [place], but having constant diarrhea, pt is uncomfortable in the recliner        Exercises     Shoulder Instructions       General Comments      Pertinent Vitals/ Pain       Pain Assessment: Faces Faces Pain Scale: Hurts little more Pain Location: abdome/ buttocks Pain Descriptors / Indicators: Sore;Aching;Discomfort;Guarding Pain Intervention(s): Monitored during session;Repositioned  Home Living                                          Prior Functioning/Environment  Frequency  Min 2X/week        Progress Toward Goals  OT Goals(current goals can now be found in the care plan section)  Progress towards OT goals: Progressing toward goals  Acute Rehab OT Goals Patient Stated Goal: to go home OT Goal Formulation: With patient Time For Goal Achievement: 06/01/20 Potential to Achieve Goals: Good  Plan Discharge plan remains appropriate    Co-evaluation                 AM-PAC OT "6 Clicks" Daily Activity     Outcome Measure   Help from another person eating meals?: None Help from another person taking care of personal grooming?: A Little Help from another person toileting, which includes using toliet, bedpan, or urinal?: A Lot Help from another person bathing (including  washing, rinsing, drying)?: A Lot Help from another person to put on and taking off regular upper body clothing?: A Little Help from another person to put on and taking off regular lower body clothing?: A Lot 6 Click Score: 16    End of Session Equipment Utilized During Treatment: Gait belt;Rolling walker  OT Visit Diagnosis: Other abnormalities of gait and mobility (R26.89);Muscle weakness (generalized) (M62.81);Other symptoms and signs involving cognitive function   Activity Tolerance Treatment limited secondary to medical complications (Comment) (constant diarrhea)   Patient Left in bed;with call bell/phone within reach;with bed alarm set   Nurse Communication          Time: 6812-7517 OT Time Calculation (min): 25 min  Charges: OT General Charges $OT Visit: 1 Visit OT Treatments $Self Care/Home Management : 23-37 mins  Tonya Hughes, OTR/L Acute Rehabilitation Services Pager: [phone] Office: [phone]   Tonya Hughes 05/23/2020, 11:30 AM

## 2020-05-23 NOTE — Progress Notes (Signed)
[facility]   Subjective  - not hungry   Objective (!) 162/82 74 97.9 F (36.6 C) (Oral) 18 98%  Intake/Output Summary (Last 24 hours) at 05/23/2020 1042 Last data filed at 05/22/2020 1700 Gross per 24 hour  Intake 500 ml  Output --  Net 500 ml   Right and left groin with fibrinous exudate  Assessment/Planning: Poorly healing groin bilaterally combination of obesity and malnutrition Start santyl on groins today  Most likely will need debridement of both groins in the OR if they don't improve in the next few days.  Ruta Hinds 05/23/2020 10:42 AM --  Laboratory Lab Results: Recent Labs    05/22/20 0834 05/23/20 0250  WBC 6.1 6.2  HGB 9.1* 8.5*  HCT 29.0* 26.5*  PLT 250 253   BMET Recent Labs    05/21/20 0323 05/22/20 0834  NA 132* 134*  K 3.8 3.5  CL 99 100  CO2 23 26  GLUCOSE 86 92  BUN 7* <5*  CREATININE 0.78 0.81  CALCIUM 7.6* 7.9*    COAG Lab Results  Component Value Date   INR 1.5 (H) 04/18/2020   INR 1.1 04/15/2020   No results found for: PTT

## 2020-05-23 NOTE — Progress Notes (Addendum)
[facility]  PROGRESS NOTE  Tonya Hughes BOF:[id number] DOB: 1951-03-25 DOA: 05/17/2020 PCP: Monico Blitz, MD   Brief HPI:   69 year old female with medical history of PAD s/p aortobifemoral bypass on 7/78/2423, diastolic heart failure admitted on 716 after presenting with abdominal pain and felt to have ischemic colitis.  Vascular surgery was consulted.  In the ER patient was noted to have positive C. difficile antigen but negative for toxin although she did have diarrhea.  However PCR was positive.  CT abdomen pelvis noted colitis of the descending colon and rectosigmoid with improvement of chronic changes of splenic flexure seen on prior CT.  She was started on IV antibiotics and oral vancomycin.  Also developed rectal bleeding with drop in hemoglobin requiring 2 units PRBC.  GI was consulted, underwent flexible sigmoidoscopy on 05/20/2020.  Sigmoidoscopy pathology consistent with ischemic colitis.  Pathology is currently pending.    Subjective   Patient seen and examined, continues to have abdominal discomfort.  Still has diarrhea.   Assessment/Plan:     1. ?  Ischemic colitis-seen on flexible sigmoidoscopy, patient started on Rocephin and Flagyl.  Pathology from rectosigmoid biopsy showed chronic active nonspecific colitis with ulceration, negative for granulomata and dysplasia.  Findings are compatible with with chronic ischemic colitis.  Differential include infection, drug effect and stercoral proctitis. 2. Probable C. difficile colitis-C. difficile testing was positive for antigen but negative for toxin, PCR was positive.  Patient presented with diarrhea.  Has been on oral vancomycin since 05/17/2020.  Will need 10 days of treatment. 3. Rectal bleeding/anemia-patient had rectal bleed starting 05/18/2020.  Hemoglobin dropped to 6.2 on 1721, required 2 units PRBC.  Hemoglobin has improved to 8.5.  Follow CBC in a.m. and transfuse for hemoglobin less than 7. 4. Status post  aortobifemoral bypass-vascular surgery following.  She has poor wound healing in both groins.  Vascular surgery planning to take her to the OR if no improvement in next few days. 5. Acute kidney injury on CKD stage II-resolved, creatinine back to baseline. 6. Hypertension-antihypertensives including  lisinopril and Coreg were on hold since admission.  Blood pressure is now elevated.  Will restart these medications. 7. Chronic diastolic CHF-restart Lasix 40 mg daily.  Follow BMP in a.m.   Scheduled medications:   . atorvastatin  40 mg Oral QHS  . carvedilol  3.125 mg Oral BID  . collagenase   Topical Daily  . loratadine  10 mg Oral Daily  . PARoxetine  20 mg Oral Daily  . sodium chloride flush  10-40 mL Intracatheter Q12H  . vancomycin  125 mg Oral QID         CBG: Recent Labs  Lab 05/22/20 1205 05/22/20 1717 05/22/20 2038 05/23/20 0005 05/23/20 0803  GLUCAP 122* 97 100* 89 93    SpO2: 99 %    CBC: Recent Labs  Lab 05/19/20 0608 05/19/20 0608 05/19/20 2324 05/20/20 0648 05/21/20 0323 05/22/20 0834 05/23/20 0250  WBC 6.0  --   --  6.3 5.6 6.1 6.2  NEUTROABS  --   --   --   --   --  3.3  --   HGB 6.2*   < > 8.6* 9.1* 8.4* 9.1* 8.5*  HCT 19.6*   < > 25.5* 26.9* 25.0* 29.0* 26.5*  MCV 90.7  --   --  89.7 90.3 92.4 91.1  PLT 215  --   --  235 116* 250 253   < > = values in this interval not displayed.  Basic Metabolic Panel: Recent Labs  Lab 05/18/20 0505 05/19/20 0608 05/20/20 0648 05/21/20 0323 05/22/20 0834  NA 132* 133* 134* 132* 134*  K 3.3* 4.2 3.9 3.8 3.5  CL 91* 97* 100 99 100  CO2 29 26 26 23 26   GLUCOSE 94 115* 94 86 92  BUN 15 13 8  7* <5*  CREATININE 1.11* 1.02* 0.80 0.78 0.81  CALCIUM 7.9* 7.7* 7.7* 7.6* 7.9*  MG 1.8 1.7  --   --  1.7  PHOS 4.5  --   --   --   --      Liver Function Tests: Recent Labs  Lab 05/17/20 1518  AST 29  ALT 15  ALKPHOS 66  BILITOT 0.9  PROT 7.4  ALBUMIN 2.6*     Antibiotics: Anti-infectives  (From admission, onward)   Start     Dose/Rate Route Frequency Ordered Stop   05/18/20 0830  metroNIDAZOLE (FLAGYL) IVPB 500 mg     Discontinue     500 mg 100 mL/hr over 60 Minutes Intravenous Every 8 hours 05/18/20 0802     05/17/20 2345  vancomycin (VANCOCIN) 50 mg/mL oral solution 125 mg     Discontinue     125 mg Oral 4 times daily 05/17/20 2326 05/27/20 2159   05/17/20 2345  cefTRIAXone (ROCEPHIN) 2 g in sodium chloride 0.9 % 100 mL IVPB     Discontinue     2 g 200 mL/hr over 30 Minutes Intravenous Every 24 hours 05/17/20 2335     05/17/20 2330  ciprofloxacin (CIPRO) IVPB 400 mg  Status:  Discontinued        400 mg 200 mL/hr over 60 Minutes Intravenous Every 12 hours 05/17/20 2301 05/17/20 2334   05/17/20 2300  metroNIDAZOLE (FLAGYL) IVPB 500 mg        500 mg 100 mL/hr over 60 Minutes Intravenous  Once 05/17/20 2251 05/18/20 0131       DVT prophylaxis: Initially started on heparin, discontinued 05/18/2020.  Started on SCDs.  Code Status: Full code  Family Communication: No family at bedside    Status is: Inpatient  Dispo: The patient is from: Home              Anticipated d/c is to: Home              Anticipated d/c date is: 05/27/2020              Patient currently ongoing abdominal pain, diarrhea, ischemic colitis, poor wound healing after aortobifemoral bypass.  Barrier to discharge-ongoing diarrhea, abdominal pain.  Vascular surgery planning to take her to the OR if continues to have poor wound healing.  Pressure Injury 04/27/20 Heel Left Deep Tissue Pressure Injury - Purple or maroon localized area of discolored intact skin or blood-filled blister due to damage of underlying soft tissue from pressure and/or shear. (Active)  04/27/20 2110  Location: Heel  Location Orientation: Left  Staging: Deep Tissue Pressure Injury - Purple or maroon localized area of discolored intact skin or blood-filled blister due to damage of underlying soft tissue from pressure and/or shear.   Wound Description (Comments):   Present on Admission:      Consultants:  Vascular surgery  Procedures:  Flexible sigmoidoscopy on 05/20/2020-showed findings of ischemic colitis.     Objective   Vitals:   05/22/20 1510 05/22/20 2040 05/23/20 0741 05/23/20 1400  BP: (!) 151/62 (!) 149/60 (!) 162/82 (!) 148/80  Pulse: 67 68 74 78  Resp: 15 17 18  17  Temp: 98.6 F (37 C) 98.3 F (36.8 C) 97.9 F (36.6 C) 97.6 F (36.4 C)  TempSrc: Oral Oral Oral Oral  SpO2: 100% 98% 98% 99%  Weight:        Intake/Output Summary (Last 24 hours) at 05/23/2020 1652 Last data filed at 05/23/2020 1500 Gross per 24 hour  Intake 870 ml  Output --  Net 870 ml    07/20 1901 - 07/22 0700 In: 777 [P.O.:777] Out: -   Filed Weights   05/19/20 0423  Weight: 73.4 kg    Physical Examination:    General: Appears in no acute distress  Cardiovascular: S1-S2, regular, no murmur auscultated  Respiratory: Clear to auscultation bilaterally  Abdomen: Abdomen is soft, mild tenderness throughout abdomen  Extremities: No edema in the lower extremities  Neurologic: Alert, oriented x3, intact insight and judgment    Data Reviewed:   Recent Results (from the past 240 hour(s))  C Difficile Quick Screen w PCR reflex     Status: Abnormal   Collection Time: 05/17/20  7:49 PM   Specimen: STOOL  Result Value Ref Range Status   C Diff antigen POSITIVE (A) NEGATIVE Final   C Diff toxin NEGATIVE NEGATIVE Final   C Diff interpretation Results are indeterminate. See PCR results.  Final    Comment: Performed at [facility], [place] [address] [place], [place] [address]  C. Diff by PCR, Reflexed     Status: Abnormal   Collection Time: 05/17/20  7:49 PM  Result Value Ref Range Status   Toxigenic C. Difficile by PCR POSITIVE (A) NEGATIVE Final    Comment: Positive for toxigenic C. difficile with little to no toxin production. Only treat if clinical presentation suggests symptomatic  illness. Performed at [facility], [place] [address] [place], [place] [address]   SARS Coronavirus 2 by RT PCR (hospital order, performed in [facility] hospital lab) Nasopharyngeal Nasopharyngeal Swab     Status: None   Collection Time: 05/18/20 12:16 AM   Specimen: Nasopharyngeal Swab  Result Value Ref Range Status   SARS Coronavirus 2 NEGATIVE NEGATIVE Final    Comment: (NOTE) SARS-CoV-2 target nucleic acids are NOT DETECTED.  The SARS-CoV-2 RNA is generally detectable in upper and lower respiratory specimens during the acute phase of infection. The lowest concentration of SARS-CoV-2 viral copies this assay can detect is 250 copies / mL. A negative result does not preclude SARS-CoV-2 infection and should not be used as the sole basis for treatment or other patient management decisions.  A negative result may occur with improper specimen collection / handling, submission of specimen other than nasopharyngeal swab, presence of viral mutation(s) within the areas targeted by this assay, and inadequate number of viral copies (<250 copies / mL). A negative result must be combined with clinical observations, patient history, and epidemiological information.  Fact Sheet for Patients:   StrictlyIdeas.no  Fact Sheet for Healthcare Providers: BankingDealers.co.za  This test is not yet approved or  cleared by the [place] FDA and has been authorized for detection and/or diagnosis of SARS-CoV-2 by FDA under an Emergency Use Authorization (EUA).  This EUA will remain in effect (meaning this test can be used) for the duration of the COVID-19 declaration under Section 564(b)(1) of the Act, 21 U.S.C. section 360bbb-3(b)(1), unless the authorization is terminated or revoked sooner.  Performed at [facility], [place] [address], [place], [place] [address]     Recent Labs  Lab 05/17/20 [phone]  [facility] If 7PM-7AM, please contact night-coverage at [email or web address], Office  [phone]   05/23/2020, 4:52 PM  LOS: 6 days

## 2020-05-24 DIAGNOSIS — I5032 Chronic diastolic (congestive) heart failure: Secondary | ICD-10-CM | POA: Diagnosis not present

## 2020-05-24 DIAGNOSIS — A0472 Enterocolitis due to Clostridium difficile, not specified as recurrent: Secondary | ICD-10-CM | POA: Diagnosis not present

## 2020-05-24 DIAGNOSIS — K559 Vascular disorder of intestine, unspecified: Secondary | ICD-10-CM | POA: Diagnosis not present

## 2020-05-24 DIAGNOSIS — I10 Essential (primary) hypertension: Secondary | ICD-10-CM | POA: Diagnosis not present

## 2020-05-24 LAB — BASIC METABOLIC PANEL
Anion gap: 8 (ref 5–15)
BUN: 5 mg/dL — ABNORMAL LOW (ref 8–23)
CO2: 27 mmol/L (ref 22–32)
Calcium: 7.7 mg/dL — ABNORMAL LOW (ref 8.9–10.3)
Chloride: 98 mmol/L (ref 98–111)
Creatinine, Ser: 0.84 mg/dL (ref 0.44–1.00)
GFR calc Af Amer: >60 mL/min (ref >60)
GFR calc non Af Amer: >60 mL/min (ref >60)
Glucose, Bld: 92 mg/dL (ref 70–99)
Potassium: 3.2 mmol/L — ABNORMAL LOW (ref 3.5–5.1)
Sodium: 133 mmol/L — ABNORMAL LOW (ref 135–145)

## 2020-05-24 LAB — CBC
HCT: 30.2 % — ABNORMAL LOW (ref 36.0–46.0)
Hemoglobin: 9.6 g/dL — ABNORMAL LOW (ref 12.0–15.0)
MCH: 29.3 pg (ref 26.0–34.0)
MCHC: 31.8 g/dL (ref 30.0–36.0)
MCV: 92.1 fL (ref 80.0–100.0)
Platelets: 277 10*3/uL (ref 150–400)
RBC: 3.28 MIL/uL — ABNORMAL LOW (ref 3.87–5.11)
RDW: 17.2 % — ABNORMAL HIGH (ref 11.5–15.5)
WBC: 8 10*3/uL (ref 4.0–10.5)
nRBC: 0 % (ref 0.0–0.2)

## 2020-05-24 MED ORDER — VANCOMYCIN 50 MG/ML ORAL SOLUTION
125.0000 mg | Freq: Four times a day (QID) | ORAL | Status: DC
  Administered 2020-05-24 – 2020-05-26 (×2): 125 mg via ORAL
  Filled 2020-05-24 (×26): qty 2.5

## 2020-05-24 MED ORDER — POTASSIUM CHLORIDE 10 MEQ/100ML IV SOLN
10.0000 meq | INTRAVENOUS | Status: AC
  Administered 2020-05-24 (×2): 10 meq via INTRAVENOUS
  Filled 2020-05-24 (×2): qty 100

## 2020-05-24 NOTE — Progress Notes (Addendum)
Physical Therapy Treatment Patient Details Name: Tonya Hughes MRN: [id number] DOB: 1951-07-27 Today's Date: 05/24/2020    History of Present Illness Pt is a 69 year old woman admitted one day after d/c from SNF with abdominal pain and diarrhea, hyponatremia and AKI. Possible ischemic colitis. Pt discharged from [facility] on 04/[facility]/20 after admission for aortobifemoral BPG with complication of sigmoid ischemia. PMH: CHF, COPD, MDD, HTN, HLD.      PT Comments    Pt supine in bed on arrival this session.  Pt eager to mobilize to bathroom but refused to ambulate into the restroom.  PTA brought bedside commode over and focused on bed to commode and back to bed.  Pt unable to perform pericare without assistance.  Continue to recommend HHPT at d/c.    Follow Up Recommendations  Home health PT;Supervision/Assistance - 24 hour     Equipment Recommendations  None recommended by PT    Recommendations for Other Services       Precautions / Restrictions Precautions Precautions: Fall Precaution Comments: pressure wound on left heel, c-diff diarrhea    Mobility  Bed Mobility Overal bed mobility: Needs Assistance Bed Mobility: Sit to Supine Rolling: Supervision   Supine to sit: Min assist     General bed mobility comments: Min assistance to return to bed lifting B LEs.  Pt able to come to sitting unassisted.  Transfers Overall transfer level: Needs assistance Equipment used: Rolling walker (2 wheeled) Transfers: Sit to/from Stand Sit to Stand: Min guard         General transfer comment: Min guard to rise into standing.  Ambulation/Gait Ambulation/Gait assistance: Min guard Gait Distance (Feet): 4 Feet (x2) Assistive device: Rolling walker (2 wheeled) Gait Pattern/deviations: Step-through pattern;Decreased stride length;Trunk flexed;Steppage     General Gait Details: Pt with decreased weight bearing on L foot ambulating on toe.  Pt only performing steps from bed to commode and back to  bed.   Stairs             Wheelchair Mobility    Modified Rankin (Stroke Patients Only)       Balance Overall balance assessment: Needs assistance Sitting-balance support: Feet supported;No upper extremity supported Sitting balance-Leahy Scale: Good Sitting balance - Comments: no LOB   Standing balance support: Bilateral upper extremity supported Standing balance-Leahy Scale: Poor                              Cognition Arousal/Alertness: Awake/alert Behavior During Therapy: Flat affect Overall Cognitive Status: Impaired/Different from baseline Area of Impairment: Attention;Memory;Following commands;Safety/judgement;Awareness;Problem solving                   Current Attention Level: Sustained Memory: Decreased short-term memory Following Commands: Follows one step commands with increased time Safety/Judgement: Decreased awareness of safety;Decreased awareness of deficits Awareness: Intellectual Problem Solving: Slow processing;Decreased initiation;Requires verbal cues;Requires tactile cues General Comments: Pt more receptive to participation but still easily agitated at times.  Easier to redirect to activity this session.      Exercises General Exercises - Lower Extremity Ankle Circles/Pumps: AROM;Supine    General Comments        Pertinent Vitals/Pain Pain Assessment: Faces Faces Pain Scale: Hurts little more Pain Location: abdomen/ buttocks Pain Descriptors / Indicators: Sore;Aching;Discomfort;Guarding Pain Intervention(s): Monitored during session;Repositioned    Home Living   Living Arrangements: Spouse/significant other  Prior Function            PT Goals (current goals can now be found in the care plan section) Acute Rehab PT Goals Patient Stated Goal: to go home Potential to Achieve Goals: Fair Progress towards PT goals: Progressing toward goals    Frequency    Min 3X/week      PT Plan  Current plan remains appropriate    Co-evaluation              AM-PAC PT "6 Clicks" Mobility   Outcome Measure  Help needed turning from your back to your side while in a flat bed without using bedrails?: None Help needed moving from lying on your back to sitting on the side of a flat bed without using bedrails?: A Little Help needed moving to and from a bed to a chair (including a wheelchair)?: A Little Help needed standing up from a chair using your arms (e.g., wheelchair or bedside chair)?: A Little Help needed to walk in hospital room?: A Little Help needed climbing 3-5 steps with a railing? : A Lot 6 Click Score: 18    End of Session Equipment Utilized During Treatment: Gait belt Activity Tolerance: Patient tolerated treatment well Patient left: with bed alarm set;in bed Nurse Communication: Mobility status PT Visit Diagnosis: Other abnormalities of gait and mobility (R26.89);Difficulty in walking, not elsewhere classified (R26.2);Muscle weakness (generalized) (M62.81);Other symptoms and signs involving the nervous system (R29.898);Pain Pain - Right/Left: Left Pain - part of body: Leg     Time: 4718-5501 PT Time Calculation (min) (ACUTE ONLY): 23 min  Charges:  $Therapeutic Activity: 23-37 mins                     Erasmo Leventhal , PTA Acute Rehabilitation Services Pager [phone] Office [phone]     Juandiego Kolenovic Eli Hose 05/24/2020, 3:44 PM

## 2020-05-24 NOTE — Progress Notes (Addendum)
  Progress Note    05/24/2020 10:10 AM 4 Days Post-Op  Subjective:  States her stomach hurts but she is ready to go home   Vitals:   05/23/20 2220 05/24/20 0648  BP: (!) 149/59 (!) 139/55  Pulse: 69 64  Resp: 15 15  Temp: 98 F (36.7 C) 98.3 F (36.8 C)  SpO2: 98% 100%   Physical Exam: Cardiac:  regular Lungs: non labored Incisions:  Bilateral groin incisions with some dehiscence of wounds. No surrounding erythema. No drainage on compression. Left greater than right with fibrinous exudate. Santyl and dry gauze applied Extremities:  Well perfused and warm. Palpable DP bilaterally Abdomen:  Obese, mild tenderness. Midline incision clean, dry and intact. Healing well Neurologic:alert and oriented  CBC    Component Value Date/Time   WBC 8.0 05/24/2020 0719   RBC 3.28 (L) 05/24/2020 0719   HGB 9.6 (L) 05/24/2020 0719   HCT 30.2 (L) 05/24/2020 0719   PLT 277 05/24/2020 0719   MCV 92.1 05/24/2020 0719   MCH 29.3 05/24/2020 0719   MCHC 31.8 05/24/2020 0719   RDW 17.2 (H) 05/24/2020 0719   LYMPHSABS 2.2 05/22/2020 0834   MONOABS 0.4 05/22/2020 0834   EOSABS 0.1 05/22/2020 0834   BASOSABS 0.0 05/22/2020 0834    BMET    Component Value Date/Time   NA 133 (L) 05/24/2020 0719   K 3.2 (L) 05/24/2020 0719   CL 98 05/24/2020 0719   CO2 27 05/24/2020 0719   GLUCOSE 92 05/24/2020 0719   BUN 5 (L) 05/24/2020 0719   CREATININE 0.84 05/24/2020 0719   CALCIUM 7.7 (L) 05/24/2020 0719   GFRNONAA >60 05/24/2020 0719   GFRAA >60 05/24/2020 0719    INR    Component Value Date/Time   INR 1.5 (H) 04/18/2020 1648     Intake/Output Summary (Last 24 hours) at 05/24/2020 1010 Last data filed at 05/23/2020 1500 Gross per 24 hour  Intake 480 ml  Output --  Net 480 ml     Assessment/Plan:  69 y.o. female is s/p aortobifemoral bypass readmitted with ongoing ischemic colitis, c. Difficile with malnutrition. Dehiscence of bilateral groin incisions- apply santyl to bilateral groins  and dry dressings. Will need to closely monitor may need debridement in OR. Fibrinous tissue present in wounds otherwise without any signs of infection. Mobilize as tolerated  Karoline Caldwell, PA-C [facility] [phone] 05/24/2020 10:10 AM   Left groin more open than right no graft exposed Continue santyl Will plan for groin debridement in OR on Monday  Ruta Hinds, MD [facility] [place] Office: [phone]

## 2020-05-24 NOTE — Progress Notes (Signed)
[facility]  PROGRESS NOTE  Tonya Hughes OFB:[id number] DOB: Jan 16, 1951 DOA: 05/17/2020 PCP: Monico Blitz, MD   Brief HPI:   69 year old female with medical history of PAD s/p aortobifemoral bypass on 7/82/4235, diastolic heart failure admitted on 716 after presenting with abdominal pain and felt to have ischemic colitis.  Vascular surgery was consulted.  In the ER patient was noted to have positive C. difficile antigen but negative for toxin although she did have diarrhea.  However PCR was positive.  CT abdomen pelvis noted colitis of the descending colon and rectosigmoid with improvement of chronic changes of splenic flexure seen on prior CT.  She was started on IV antibiotics and oral vancomycin.  Also developed rectal bleeding with drop in hemoglobin requiring 2 units PRBC.  GI was consulted, underwent flexible sigmoidoscopy on 05/20/2020.  Sigmoidoscopy pathology consistent with ischemic colitis.  Pathology is currently pending.    Subjective   Patient seen and examined, complains of abdominal discomfort and diarrhea.  She has not been taking p.o. vancomycin due to nausea.   Assessment/Plan:     1. ?  Ischemic colitis-seen on flexible sigmoidoscopy, patient started on Rocephin and Flagyl.  Pathology from rectosigmoid biopsy showed chronic active nonspecific colitis with ulceration, negative for granulomata and dysplasia.  Findings are compatible with with chronic ischemic colitis.  Differential include infection, drug effect and stercoral proctitis.  Will discontinue IV ceftriaxone. 2. Probable C. difficile colitis-C. difficile testing was positive for antigen but negative for toxin, PCR was positive.  Patient presented with diarrhea.  She was started on oral vancomycin since 05/17/2020 for 10 days.  Patient has not been taking medication due to nausea.  Will discontinue p.o. vancomycin.  Continue IV Flagyl five 1 mg every 8 hours, Flagyl was started on 05/18/2020.  Will complete 10 days  of course of Flagyl. 3. Hypokalemia-potassium 3.2, will replace potassium and follow BMP in a.m. 4. Rectal bleeding/anemia-patient had rectal bleed starting 05/18/2020.  Hemoglobin dropped to 6.2 on 1721, required 2 units PRBC.  Hemoglobin has improved to 8.5.  Follow CBC in a.m. and transfuse for hemoglobin less than 7. 5. Status post aortobifemoral bypass-vascular surgery following.  She has poor wound healing in both groins.  Vascular surgery planning to take her to the OR if no improvement in next few days. 6. Acute kidney injury on CKD stage II-resolved, creatinine back to baseline. 7. Hypertension-antihypertensives including  lisinopril and Coreg were on hold since admission.  Blood pressure is now elevated.  Will restart these medications. 8. Chronic diastolic CHF-restart Lasix 40 mg daily.  Follow BMP in a.m.   Scheduled medications:   . atorvastatin  40 mg Oral QHS  . carvedilol  3.125 mg Oral BID  . collagenase   Topical Daily  . furosemide  40 mg Oral Daily  . lisinopril  5 mg Oral Daily  . loratadine  10 mg Oral Daily  . PARoxetine  20 mg Oral Daily  . potassium chloride SA  20 mEq Oral Daily  . sodium chloride flush  10-40 mL Intracatheter Q12H  . vancomycin  125 mg Oral QID         CBG: Recent Labs  Lab 05/22/20 1205 05/22/20 1717 05/22/20 2038 05/23/20 0005 05/23/20 0803  GLUCAP 122* 97 100* 89 93    SpO2: 100 %    CBC: Recent Labs  Lab 05/20/20 0648 05/21/20 0323 05/22/20 0834 05/23/20 0250 05/24/20 0719  WBC 6.3 5.6 6.1 6.2 8.0  NEUTROABS  --   --  3.3  --   --   HGB 9.1* 8.4* 9.1* 8.5* 9.6*  HCT 26.9* 25.0* 29.0* 26.5* 30.2*  MCV 89.7 90.3 92.4 91.1 92.1  PLT 235 116* 250 253 973    Basic Metabolic Panel: Recent Labs  Lab 05/18/20 0505 05/18/20 0505 05/19/20 5329 05/20/20 0648 05/21/20 0323 05/22/20 0834 05/24/20 0719  NA 132*   < > 133* 134* 132* 134* 133*  K 3.3*   < > 4.2 3.9 3.8 3.5 3.2*  CL 91*   < > 97* 100 99 100 98  CO2 29    < > 26 26 23 26 27   GLUCOSE 94   < > 115* 94 86 92 92  BUN 15   < > 13 8 7* <5* 5*  CREATININE 1.11*   < > 1.02* 0.80 0.78 0.81 0.84  CALCIUM 7.9*   < > 7.7* 7.7* 7.6* 7.9* 7.7*  MG 1.8  --  1.7  --   --  1.7  --   PHOS 4.5  --   --   --   --   --   --    < > = values in this interval not displayed.     Liver Function Tests: No results for input(s): AST, ALT, ALKPHOS, BILITOT, PROT, ALBUMIN in the last 168 hours.   Antibiotics: Anti-infectives (From admission, onward)   Start     Dose/Rate Route Frequency Ordered Stop   05/18/20 0830  metroNIDAZOLE (FLAGYL) IVPB 500 mg     Discontinue     500 mg 100 mL/hr over 60 Minutes Intravenous Every 8 hours 05/18/20 0802     05/17/20 2345  vancomycin (VANCOCIN) 50 mg/mL oral solution 125 mg     Discontinue     125 mg Oral 4 times daily 05/17/20 2326 05/27/20 2159   05/17/20 2345  cefTRIAXone (ROCEPHIN) 2 g in sodium chloride 0.9 % 100 mL IVPB  Status:  Discontinued        2 g 200 mL/hr over 30 Minutes Intravenous Every 24 hours 05/17/20 2335 05/24/20 1226   05/17/20 2330  ciprofloxacin (CIPRO) IVPB 400 mg  Status:  Discontinued        400 mg 200 mL/hr over 60 Minutes Intravenous Every 12 hours 05/17/20 2301 05/17/20 2334   05/17/20 2300  metroNIDAZOLE (FLAGYL) IVPB 500 mg        500 mg 100 mL/hr over 60 Minutes Intravenous  Once 05/17/20 2251 05/18/20 0131       DVT prophylaxis: Initially started on heparin, discontinued 05/18/2020.  Started on SCDs.  Code Status: Full code  Family Communication: No family at bedside    Status is: Inpatient  Dispo: The patient is from: Home              Anticipated d/c is to: Home              Anticipated d/c date is: 05/27/2020              Patient currently ongoing abdominal pain, diarrhea, ischemic colitis, poor wound healing after aortobifemoral bypass.  Barrier to discharge-ongoing diarrhea, abdominal pain.  Vascular surgery planning to take her to the OR if continues to have poor wound  healing.  Pressure Injury 04/27/20 Heel Left Deep Tissue Pressure Injury - Purple or maroon localized area of discolored intact skin or blood-filled blister due to damage of underlying soft tissue from pressure and/or shear. (Active)  04/27/20 2110  Location: Heel  Location Orientation: Left  Staging:  Deep Tissue Pressure Injury - Purple or maroon localized area of discolored intact skin or blood-filled blister due to damage of underlying soft tissue from pressure and/or shear.  Wound Description (Comments):   Present on Admission:      Consultants:  Vascular surgery  Procedures:  Flexible sigmoidoscopy on 05/20/2020-showed findings of ischemic colitis.     Objective   Vitals:   05/23/20 1400 05/23/20 2220 05/24/20 0648 05/24/20 1317  BP: (!) 148/80 (!) 149/59 (!) 139/55 (!) 140/68  Pulse: 78 69 64 71  Resp: 17 15 15 18   Temp: 97.6 F (36.4 C) 98 F (36.7 C) 98.3 F (36.8 C) 98.8 F (37.1 C)  TempSrc: Oral Oral Oral Oral  SpO2: 99% 98% 100% 100%  Weight:        Intake/Output Summary (Last 24 hours) at 05/24/2020 1525 Last data filed at 05/24/2020 1318 Gross per 24 hour  Intake 702 ml  Output --  Net 702 ml    07/21 1901 - 07/23 0700 In: 720 [P.O.:720] Out: -   Filed Weights   05/19/20 0423  Weight: 73.4 kg    Physical Examination:   General-appears in no acute distress  Heart-S1-S2, regular, no murmur auscultated  Lungs-clear to auscultation bilaterally, no wheezing or crackles auscultated  Abdomen-soft, mild generalized tenderness to palpation, no organomegaly  Extremities-no edema in the lower extremities  Neuro-alert, oriented x3, no focal deficit noted    Data Reviewed:   Recent Results (from the past 240 hour(s))  C Difficile Quick Screen w PCR reflex     Status: Abnormal   Collection Time: 05/17/20  7:49 PM   Specimen: STOOL  Result Value Ref Range Status   C Diff antigen POSITIVE (A) NEGATIVE Final   C Diff toxin NEGATIVE NEGATIVE  Final   C Diff interpretation Results are indeterminate. See PCR results.  Final    Comment: Performed at [facility], [place] [address] [place], [place] [address]  C. Diff by PCR, Reflexed     Status: Abnormal   Collection Time: 05/17/20  7:49 PM  Result Value Ref Range Status   Toxigenic C. Difficile by PCR POSITIVE (A) NEGATIVE Final    Comment: Positive for toxigenic C. difficile with little to no toxin production. Only treat if clinical presentation suggests symptomatic illness. Performed at [facility], [place] [address] [place], [place] [address]   SARS Coronavirus 2 by RT PCR (hospital order, performed in [facility] hospital lab) Nasopharyngeal Nasopharyngeal Swab     Status: None   Collection Time: 05/18/20 12:16 AM   Specimen: Nasopharyngeal Swab  Result Value Ref Range Status   SARS Coronavirus 2 NEGATIVE NEGATIVE Final    Comment: (NOTE) SARS-CoV-2 target nucleic acids are NOT DETECTED.  The SARS-CoV-2 RNA is generally detectable in upper and lower respiratory specimens during the acute phase of infection. The lowest concentration of SARS-CoV-2 viral copies this assay can detect is 250 copies / mL. A negative result does not preclude SARS-CoV-2 infection and should not be used as the sole basis for treatment or other patient management decisions.  A negative result may occur with improper specimen collection / handling, submission of specimen other than nasopharyngeal swab, presence of viral mutation(s) within the areas targeted by this assay, and inadequate number of viral copies (<250 copies / mL). A negative result must be combined with clinical observations, patient history, and epidemiological information.  Fact Sheet for Patients:   StrictlyIdeas.no  Fact Sheet for Healthcare Providers: BankingDealers.co.za  This test is not yet approved or  cleared by the [place] and has been authorized for  detection and/or diagnosis of SARS-CoV-2 by FDA under an Emergency Use Authorization (EUA).  This EUA will remain in effect (meaning this test can be used) for the duration of the COVID-19 declaration under Section 564(b)(1) of the Act, 21 U.S.C. section 360bbb-3(b)(1), unless the authorization is terminated or revoked sooner.  Performed at [facility], [place] [address], [place], [place] [address]     No results for input(s): LIPASE, AMYLASE in the last 168 hours.    Oswald Hillock   [facility] If 7PM-7AM, please contact night-coverage at [email or web address], Office  [phone]   05/24/2020, 3:25 PM  LOS: 7 days

## 2020-05-25 DIAGNOSIS — A0472 Enterocolitis due to Clostridium difficile, not specified as recurrent: Secondary | ICD-10-CM | POA: Diagnosis not present

## 2020-05-25 DIAGNOSIS — I5032 Chronic diastolic (congestive) heart failure: Secondary | ICD-10-CM | POA: Diagnosis not present

## 2020-05-25 DIAGNOSIS — I779 Disorder of arteries and arterioles, unspecified: Secondary | ICD-10-CM | POA: Diagnosis not present

## 2020-05-25 DIAGNOSIS — I10 Essential (primary) hypertension: Secondary | ICD-10-CM | POA: Diagnosis not present

## 2020-05-25 LAB — CBC
HCT: 31.1 % — ABNORMAL LOW (ref 36.0–46.0)
Hemoglobin: 9.9 g/dL — ABNORMAL LOW (ref 12.0–15.0)
MCH: 29 pg (ref 26.0–34.0)
MCHC: 31.8 g/dL (ref 30.0–36.0)
MCV: 91.2 fL (ref 80.0–100.0)
Platelets: 298 10*3/uL (ref 150–400)
RBC: 3.41 MIL/uL — ABNORMAL LOW (ref 3.87–5.11)
RDW: 17.3 % — ABNORMAL HIGH (ref 11.5–15.5)
WBC: 8.1 10*3/uL (ref 4.0–10.5)
nRBC: 0 % (ref 0.0–0.2)

## 2020-05-25 NOTE — Progress Notes (Signed)
[facility]  PROGRESS NOTE  Tonya Hughes NTI:[id number] DOB: 1951/01/23 DOA: 05/17/2020 PCP: Monico Blitz, MD   Brief HPI:   69 year old female with medical history of PAD s/p aortobifemoral bypass on 8/67/6195, diastolic heart failure admitted on 716 after presenting with abdominal pain and felt to have ischemic colitis.  Vascular surgery was consulted.  In the ER patient was noted to have positive C. difficile antigen but negative for toxin although she did have diarrhea.  However PCR was positive.  CT abdomen pelvis noted colitis of the descending colon and rectosigmoid with improvement of chronic changes of splenic flexure seen on prior CT.  She was started on IV antibiotics and oral vancomycin.  Also developed rectal bleeding with drop in hemoglobin requiring 2 units PRBC.  GI was consulted, underwent flexible sigmoidoscopy on 05/20/2020.  Sigmoidoscopy pathology consistent with ischemic colitis.  Pathology is currently pending.    Subjective   Patient seen and examined, diarrhea is slowly improving.  Still complains of abdominal pain.   Assessment/Plan:     1. ?  Ischemic colitis-seen on flexible sigmoidoscopy, patient started on Rocephin and Flagyl.  Pathology from rectosigmoid biopsy showed chronic active nonspecific colitis with ulceration, negative for granulomata and dysplasia.  Findings are compatible with with chronic ischemic colitis.  Differential include infection, drug effect and stercoral proctitis.  IV ceftriaxone has been discontinued.  Continue with IV Flagyl. 2. Probable C. difficile colitis-C. difficile testing was positive for antigen but negative for toxin, PCR was positive.  Patient presented with diarrhea.  She was started on oral vancomycin since 05/17/2020 for 10 days. 3. Hypokalemia-potassium 3.2, potassium was replaced yesterday but no labs obtained.  Check BMP in a.m. 4. Rectal bleeding/anemia-patient had rectal bleed starting 05/18/2020.  Hemoglobin dropped  to 6.2 on 1721, required 2 units PRBC.  Hemoglobin has improved to 9.6, follow CBC in a.m. and transfuse for hemoglobin less than 7. 5. Status post aortobifemoral bypass-vascular surgery following.  She has poor wound healing in both groins.  Vascular surgery planning to take her to the OR on Monday. 6. Acute kidney injury on CKD stage II-resolved, creatinine back to baseline. 7. Hypertension-antihypertensives including  lisinopril and Coreg were on hold since admission.  Blood pressure is now elevated.  Will restart these medications. 8. Chronic diastolic CHF-restart Lasix 40 mg daily.  Follow BMP in a.m.   Scheduled medications:   . atorvastatin  40 mg Oral QHS  . carvedilol  3.125 mg Oral BID  . collagenase   Topical Daily  . furosemide  40 mg Oral Daily  . lisinopril  5 mg Oral Daily  . loratadine  10 mg Oral Daily  . PARoxetine  20 mg Oral Daily  . potassium chloride SA  20 mEq Oral Daily  . sodium chloride flush  10-40 mL Intracatheter Q12H  . vancomycin  125 mg Oral QID         CBG: Recent Labs  Lab 05/22/20 1205 05/22/20 1717 05/22/20 2038 05/23/20 0005 05/23/20 0803  GLUCAP 122* 97 100* 89 93    SpO2: 98 %    CBC: Recent Labs  Lab 05/20/20 0648 05/21/20 0323 05/22/20 0834 05/23/20 0250 05/24/20 0719  WBC 6.3 5.6 6.1 6.2 8.0  NEUTROABS  --   --  3.3  --   --   HGB 9.1* 8.4* 9.1* 8.5* 9.6*  HCT 26.9* 25.0* 29.0* 26.5* 30.2*  MCV 89.7 90.3 92.4 91.1 92.1  PLT 235 116* 250 253 277    Basic  Metabolic Panel: Recent Labs  Lab 05/19/20 0608 05/20/20 0648 05/21/20 0323 05/22/20 0834 05/24/20 0719  NA 133* 134* 132* 134* 133*  K 4.2 3.9 3.8 3.5 3.2*  CL 97* 100 99 100 98  CO2 26 26 23 26 27   GLUCOSE 115* 94 86 92 92  BUN 13 8 7* <5* 5*  CREATININE 1.02* 0.80 0.78 0.81 0.84  CALCIUM 7.7* 7.7* 7.6* 7.9* 7.7*  MG 1.7  --   --  1.7  --      Liver Function Tests: No results for input(s): AST, ALT, ALKPHOS, BILITOT, PROT, ALBUMIN in the last 168  hours.   Antibiotics: Anti-infectives (From admission, onward)   Start     Dose/Rate Route Frequency Ordered Stop   05/24/20 1630  vancomycin (VANCOCIN) 50 mg/mL oral solution 125 mg     Discontinue     125 mg Oral 4 times daily 05/24/20 1535 06/03/20 1759   05/18/20 0830  metroNIDAZOLE (FLAGYL) IVPB 500 mg     Discontinue     500 mg 100 mL/hr over 60 Minutes Intravenous Every 8 hours 05/18/20 0802     05/17/20 2345  vancomycin (VANCOCIN) 50 mg/mL oral solution 125 mg  Status:  Discontinued        125 mg Oral 4 times daily 05/17/20 2326 05/24/20 1527   05/17/20 2345  cefTRIAXone (ROCEPHIN) 2 g in sodium chloride 0.9 % 100 mL IVPB  Status:  Discontinued        2 g 200 mL/hr over 30 Minutes Intravenous Every 24 hours 05/17/20 2335 05/24/20 1226   05/17/20 2330  ciprofloxacin (CIPRO) IVPB 400 mg  Status:  Discontinued        400 mg 200 mL/hr over 60 Minutes Intravenous Every 12 hours 05/17/20 2301 05/17/20 2334   05/17/20 2300  metroNIDAZOLE (FLAGYL) IVPB 500 mg        500 mg 100 mL/hr over 60 Minutes Intravenous  Once 05/17/20 2251 05/18/20 0131       DVT prophylaxis: Initially started on heparin, discontinued 05/18/2020.  Started on SCDs.  Code Status: Full code  Family Communication: No family at bedside    Status is: Inpatient  Dispo: The patient is from: Home              Anticipated d/c is to: Home              Anticipated d/c date is: 05/27/2020              Patient currently ongoing abdominal pain, diarrhea, ischemic colitis, poor wound healing after aortobifemoral bypass.  Barrier to discharge-ongoing diarrhea, abdominal pain.  Vascular surgery planning to take her to the OR if continues to have poor wound healing.  Pressure Injury 04/27/20 Heel Left Deep Tissue Pressure Injury - Purple or maroon localized area of discolored intact skin or blood-filled blister due to damage of underlying soft tissue from pressure and/or shear. (Active)  04/27/20 2110  Location: Heel   Location Orientation: Left  Staging: Deep Tissue Pressure Injury - Purple or maroon localized area of discolored intact skin or blood-filled blister due to damage of underlying soft tissue from pressure and/or shear.  Wound Description (Comments):   Present on Admission:      Consultants:  Vascular surgery  Procedures:  Flexible sigmoidoscopy on 05/20/2020-showed findings of ischemic colitis.     Objective   Vitals:   05/24/20 1317 05/24/20 2051 05/25/20 0544 05/25/20 1435  BP: (!) 140/68 (!) 139/60 (!) 152/55 (!) 118/56  Pulse: 71 72 64 77  Resp: 18 18 16 16   Temp: 98.8 F (37.1 C) 98.6 F (37 C) 98.2 F (36.8 C) 98.3 F (36.8 C)  TempSrc: Oral Oral Oral Oral  SpO2: 100% 100% 97% 98%  Weight:        Intake/Output Summary (Last 24 hours) at 05/25/2020 1538 Last data filed at 05/25/2020 1456 Gross per 24 hour  Intake 2123.38 ml  Output --  Net 2123.38 ml    07/22 1901 - 07/24 0700 In: 922 [P.O.:922] Out: -   Filed Weights   05/19/20 0423  Weight: 73.4 kg    Physical Examination:   General-appears in no acute distress  Heart-S1-S2, regular, no murmur auscultated  Lungs-clear to auscultation bilaterally, no wheezing or crackles auscultated  Abdomen-soft, mild generalized tenderness to palpation, no organomegaly  Extremities-no edema in the lower extremities  Neuro-alert, oriented x3, no focal deficit noted    Data Reviewed:   Recent Results (from the past 240 hour(s))  C Difficile Quick Screen w PCR reflex     Status: Abnormal   Collection Time: 05/17/20  7:49 PM   Specimen: STOOL  Result Value Ref Range Status   C Diff antigen POSITIVE (A) NEGATIVE Final   C Diff toxin NEGATIVE NEGATIVE Final   C Diff interpretation Results are indeterminate. See PCR results.  Final    Comment: Performed at [facility], [place] [address] [place], [place] [address]  C. Diff by PCR, Reflexed     Status: Abnormal   Collection Time: 05/17/20  7:49 PM   Result Value Ref Range Status   Toxigenic C. Difficile by PCR POSITIVE (A) NEGATIVE Final    Comment: Positive for toxigenic C. difficile with little to no toxin production. Only treat if clinical presentation suggests symptomatic illness. Performed at [facility], [place] [address] [place], [place] [address]   SARS Coronavirus 2 by RT PCR (hospital order, performed in [facility] hospital lab) Nasopharyngeal Nasopharyngeal Swab     Status: None   Collection Time: 05/18/20 12:16 AM   Specimen: Nasopharyngeal Swab  Result Value Ref Range Status   SARS Coronavirus 2 NEGATIVE NEGATIVE Final    Comment: (NOTE) SARS-CoV-2 target nucleic acids are NOT DETECTED.  The SARS-CoV-2 RNA is generally detectable in upper and lower respiratory specimens during the acute phase of infection. The lowest concentration of SARS-CoV-2 viral copies this assay can detect is 250 copies / mL. A negative result does not preclude SARS-CoV-2 infection and should not be used as the sole basis for treatment or other patient management decisions.  A negative result may occur with improper specimen collection / handling, submission of specimen other than nasopharyngeal swab, presence of viral mutation(s) within the areas targeted by this assay, and inadequate number of viral copies (<250 copies / mL). A negative result must be combined with clinical observations, patient history, and epidemiological information.  Fact Sheet for Patients:   StrictlyIdeas.no  Fact Sheet for Healthcare Providers: BankingDealers.co.za  This test is not yet approved or  cleared by the [place] FDA and has been authorized for detection and/or diagnosis of SARS-CoV-2 by FDA under an Emergency Use Authorization (EUA).  This EUA will remain in effect (meaning this test can be used) for the duration of the COVID-19 declaration under Section 564(b)(1) of the Act, 21 U.S.C. section  360bbb-3(b)(1), unless the authorization is terminated or revoked sooner.  Performed at [facility], [place] [address], [place], [place] [address]  No results for input(s): LIPASE, AMYLASE in the last 168 hours.    Oswald Hillock   [facility] If 7PM-7AM, please contact night-coverage at [email or web address], Office  [phone]   05/25/2020, 3:38 PM  LOS: 8 days

## 2020-05-25 NOTE — Progress Notes (Signed)
Dressings changed per order.  Patient tolerated well. Will continue to monitor.

## 2020-05-26 DIAGNOSIS — I5032 Chronic diastolic (congestive) heart failure: Secondary | ICD-10-CM | POA: Diagnosis not present

## 2020-05-26 DIAGNOSIS — A0472 Enterocolitis due to Clostridium difficile, not specified as recurrent: Secondary | ICD-10-CM | POA: Diagnosis not present

## 2020-05-26 DIAGNOSIS — I10 Essential (primary) hypertension: Secondary | ICD-10-CM | POA: Diagnosis not present

## 2020-05-26 DIAGNOSIS — I779 Disorder of arteries and arterioles, unspecified: Secondary | ICD-10-CM | POA: Diagnosis not present

## 2020-05-26 LAB — BASIC METABOLIC PANEL
Anion gap: 10 (ref 5–15)
BUN: 6 mg/dL — ABNORMAL LOW (ref 8–23)
CO2: 26 mmol/L (ref 22–32)
Calcium: 8.1 mg/dL — ABNORMAL LOW (ref 8.9–10.3)
Chloride: 99 mmol/L (ref 98–111)
Creatinine, Ser: 0.85 mg/dL (ref 0.44–1.00)
GFR calc Af Amer: >60 mL/min (ref >60)
GFR calc non Af Amer: >60 mL/min (ref >60)
Glucose, Bld: 92 mg/dL (ref 70–99)
Potassium: 3.3 mmol/L — ABNORMAL LOW (ref 3.5–5.1)
Sodium: 135 mmol/L (ref 135–145)

## 2020-05-26 MED ORDER — POTASSIUM CHLORIDE CRYS ER 20 MEQ PO TBCR
40.0000 meq | EXTENDED_RELEASE_TABLET | Freq: Once | ORAL | Status: AC
  Administered 2020-05-26: 40 meq via ORAL
  Filled 2020-05-26: qty 2

## 2020-05-26 NOTE — Progress Notes (Signed)
[facility]  PROGRESS NOTE  Tonya Hughes SAY:[id number] DOB: Nov 11, 1950 DOA: 05/17/2020 PCP: Monico Blitz, MD   Brief HPI:   69 year old female with medical history of PAD s/p aortobifemoral bypass on 2/35/5732, diastolic heart failure admitted on 716 after presenting with abdominal pain and felt to have ischemic colitis.  Vascular surgery was consulted.  In the ER patient was noted to have positive C. difficile antigen but negative for toxin although she did have diarrhea.  However PCR was positive.  CT abdomen pelvis noted colitis of the descending colon and rectosigmoid with improvement of chronic changes of splenic flexure seen on prior CT.  She was started on IV antibiotics and oral vancomycin.  Also developed rectal bleeding with drop in hemoglobin requiring 2 units PRBC.  GI was consulted, underwent flexible sigmoidoscopy on 05/20/2020.  Sigmoidoscopy pathology consistent with ischemic colitis.  Pathology is currently pending.    Subjective   Patient seen and examined, diarrhea slowly improving.  Still complains of abdominal pain.   Assessment/Plan:     1. ?  Ischemic colitis-seen on flexible sigmoidoscopy, patient started on Rocephin and Flagyl.  Pathology from rectosigmoid biopsy showed chronic active nonspecific colitis with ulceration, negative for granulomata and dysplasia.  Findings are compatible with with chronic ischemic colitis.  Differential include infection, drug effect and stercoral proctitis.  IV ceftriaxone has been discontinued.  Continue with IV Flagyl. 2. Probable C. difficile colitis-C. difficile testing was positive for antigen but negative for toxin, PCR was positive.  Patient presented with diarrhea.  She was started on oral vancomycin since 05/17/2020 for 10 days. 3. Hypokalemia-potassium is 3.3 today.  Will replace potassium and follow BMP in a.m.   4. Rectal bleeding/anemia-patient had rectal bleed starting 05/18/2020.  Hemoglobin dropped to 6.2 on 17/21,  required 2 units PRBC.  Hemoglobin has improved to 9.6, follow CBC in a.m. and transfuse for hemoglobin less than 7. 5. Status post aortobifemoral bypass-vascular surgery following.  She has poor wound healing in both groins.  Vascular surgery planning to take her to the OR on Monday. 6. Acute kidney injury on CKD stage II-resolved, creatinine back to baseline. 7. Hypertension-antihypertensives including  lisinopril and Coreg were on hold since admission.  Blood pressure is now elevated.  Will restart these medications. 8. Chronic diastolic CHF-patient has ongoing diarrhea, will hold Lasix.   Scheduled medications:   . atorvastatin  40 mg Oral QHS  . carvedilol  3.125 mg Oral BID  . collagenase   Topical Daily  . furosemide  40 mg Oral Daily  . lisinopril  5 mg Oral Daily  . loratadine  10 mg Oral Daily  . PARoxetine  20 mg Oral Daily  . potassium chloride SA  20 mEq Oral Daily  . sodium chloride flush  10-40 mL Intracatheter Q12H  . vancomycin  125 mg Oral QID         CBG: Recent Labs  Lab 05/22/20 1205 05/22/20 1717 05/22/20 2038 05/23/20 0005 05/23/20 0803  GLUCAP 122* 97 100* 89 93    SpO2: 97 %    CBC: Recent Labs  Lab 05/21/20 0323 05/22/20 0834 05/23/20 0250 05/24/20 0719 05/25/20 1836  WBC 5.6 6.1 6.2 8.0 8.1  NEUTROABS  --  3.3  --   --   --   HGB 8.4* 9.1* 8.5* 9.6* 9.9*  HCT 25.0* 29.0* 26.5* 30.2* 31.1*  MCV 90.3 92.4 91.1 92.1 91.2  PLT 116* 250 253 277 202    Basic Metabolic Panel: Recent Labs  Lab 05/20/20 0648 05/21/20 0323 05/22/20 0834 05/24/20 0719 05/26/20 0632  NA 134* 132* 134* 133* 135  K 3.9 3.8 3.5 3.2* 3.3*  CL 100 99 100 98 99  CO2 26 23 26 27 26   GLUCOSE 94 86 92 92 92  BUN 8 7* <5* 5* 6*  CREATININE 0.80 0.78 0.81 0.84 0.85  CALCIUM 7.7* 7.6* 7.9* 7.7* 8.1*  MG  --   --  1.7  --   --      Liver Function Tests: No results for input(s): AST, ALT, ALKPHOS, BILITOT, PROT, ALBUMIN in the last 168  hours.   Antibiotics: Anti-infectives (From admission, onward)   Start     Dose/Rate Route Frequency Ordered Stop   05/24/20 1630  vancomycin (VANCOCIN) 50 mg/mL oral solution 125 mg     Discontinue     125 mg Oral 4 times daily 05/24/20 1535 06/03/20 1759   05/18/20 0830  metroNIDAZOLE (FLAGYL) IVPB 500 mg     Discontinue     500 mg 100 mL/hr over 60 Minutes Intravenous Every 8 hours 05/18/20 0802     05/17/20 2345  vancomycin (VANCOCIN) 50 mg/mL oral solution 125 mg  Status:  Discontinued        125 mg Oral 4 times daily 05/17/20 2326 05/24/20 1527   05/17/20 2345  cefTRIAXone (ROCEPHIN) 2 g in sodium chloride 0.9 % 100 mL IVPB  Status:  Discontinued        2 g 200 mL/hr over 30 Minutes Intravenous Every 24 hours 05/17/20 2335 05/24/20 1226   05/17/20 2330  ciprofloxacin (CIPRO) IVPB 400 mg  Status:  Discontinued        400 mg 200 mL/hr over 60 Minutes Intravenous Every 12 hours 05/17/20 2301 05/17/20 2334   05/17/20 2300  metroNIDAZOLE (FLAGYL) IVPB 500 mg        500 mg 100 mL/hr over 60 Minutes Intravenous  Once 05/17/20 2251 05/18/20 0131       DVT prophylaxis: Initially started on heparin, discontinued 05/18/2020.  Started on SCDs.  Code Status: Full code  Family Communication: No family at bedside    Status is: Inpatient  Dispo: The patient is from: Home              Anticipated d/c is to: Home              Anticipated d/c date is: 05/30/20              Patient currently ongoing abdominal pain, diarrhea, ischemic colitis, poor wound healing after aortobifemoral bypass.  Barrier to discharge-ongoing diarrhea, abdominal pain.  Vascular surgery planning to take her to the OR if continues to have poor wound healing.  Pressure Injury 04/27/20 Heel Left Deep Tissue Pressure Injury - Purple or maroon localized area of discolored intact skin or blood-filled blister due to damage of underlying soft tissue from pressure and/or shear. (Active)  04/27/20 2110  Location: Heel   Location Orientation: Left  Staging: Deep Tissue Pressure Injury - Purple or maroon localized area of discolored intact skin or blood-filled blister due to damage of underlying soft tissue from pressure and/or shear.  Wound Description (Comments):   Present on Admission:      Consultants:  Vascular surgery  Procedures:  Flexible sigmoidoscopy on 05/20/2020-showed findings of ischemic colitis.     Objective   Vitals:   05/25/20 1435 05/25/20 1958 05/26/20 0435 05/26/20 1400  BP: (!) 118/56 (!) 120/52 (!) 118/54 (!) 119/54  Pulse: 77  75 77 72  Resp: 16 16 16 17   Temp: 98.3 F (36.8 C) 98 F (36.7 C) 98.1 F (36.7 C) 98.2 F (36.8 C)  TempSrc: Oral Oral Oral Oral  SpO2: 98% 98% 97% 97%  Weight:        Intake/Output Summary (Last 24 hours) at 05/26/2020 1629 Last data filed at 05/26/2020 1300 Gross per 24 hour  Intake 600 ml  Output --  Net 600 ml    07/23 1901 - 07/25 0700 In: 1903.4 [P.O.:600; I.V.:10] Out: -   Filed Weights   05/19/20 0423  Weight: 73.4 kg    Physical Examination:  General-appears in no acute distress Heart-S1-S2, regular, no murmur auscultated Lungs-clear to auscultation bilaterally, no wheezing or crackles auscultated Abdomen-soft, mild generalized tenderness to palpation Extremities-no edema in the lower extremities Neuro-alert, oriented x3, no focal deficit noted   Data Reviewed:   Recent Results (from the past 240 hour(s))  C Difficile Quick Screen w PCR reflex     Status: Abnormal   Collection Time: 05/17/20  7:49 PM   Specimen: STOOL  Result Value Ref Range Status   C Diff antigen POSITIVE (A) NEGATIVE Final   C Diff toxin NEGATIVE NEGATIVE Final   C Diff interpretation Results are indeterminate. See PCR results.  Final    Comment: Performed at [facility], [place] [address], [place], [place] [address]  C. Diff by PCR, Reflexed     Status: Abnormal   Collection Time: 05/17/20  7:49 PM  Result Value Ref Range Status    Toxigenic C. Difficile by PCR POSITIVE (A) NEGATIVE Final    Comment: Positive for toxigenic C. difficile with little to no toxin production. Only treat if clinical presentation suggests symptomatic illness. Performed at [facility], [place] [address] [place], [place] [address]   SARS Coronavirus 2 by RT PCR (hospital order, performed in [facility] hospital lab) Nasopharyngeal Nasopharyngeal Swab     Status: None   Collection Time: 05/18/20 12:16 AM   Specimen: Nasopharyngeal Swab  Result Value Ref Range Status   SARS Coronavirus 2 NEGATIVE NEGATIVE Final    Comment: (NOTE) SARS-CoV-2 target nucleic acids are NOT DETECTED.  The SARS-CoV-2 RNA is generally detectable in upper and lower respiratory specimens during the acute phase of infection. The lowest concentration of SARS-CoV-2 viral copies this assay can detect is 250 copies / mL. A negative result does not preclude SARS-CoV-2 infection and should not be used as the sole basis for treatment or other patient management decisions.  A negative result may occur with improper specimen collection / handling, submission of specimen other than nasopharyngeal swab, presence of viral mutation(s) within the areas targeted by this assay, and inadequate number of viral copies (<250 copies / mL). A negative result must be combined with clinical observations, patient history, and epidemiological information.  Fact Sheet for Patients:   StrictlyIdeas.no  Fact Sheet for Healthcare Providers: BankingDealers.co.za  This test is not yet approved or  cleared by the [place] FDA and has been authorized for detection and/or diagnosis of SARS-CoV-2 by FDA under an Emergency Use Authorization (EUA).  This EUA will remain in effect (meaning this test can be used) for the duration of the COVID-19 declaration under Section 564(b)(1) of the Act, 21 U.S.C. section 360bbb-3(b)(1), unless the  authorization is terminated or revoked sooner.  Performed at [facility], [place] [address], [place],  [address]        Oswald Hillock  [facility] If 7PM-7AM, please contact night-coverage at [email or web address], Office  [phone]   05/26/2020, 4:29 PM  LOS: 9 days

## 2020-05-26 NOTE — H&P (View-Only) (Signed)
[facility]  Subjective  - Still having some abdominal pain but wants a coke to drink.    Objective (!) 118/54 77 98.1 F (36.7 C) (Oral) 16 97%  Intake/Output Summary (Last 24 hours) at 05/26/2020 0939 Last data filed at 05/25/2020 1456 Gross per 24 hour  Intake 1603.38 ml  Output --  Net 1603.38 ml    Both groins macerated with superficial separation on incisions Midline incision c/d/i Palpable DP pulses bilaterally  Laboratory Lab Results: Recent Labs    05/24/20 0719 05/25/20 1836  WBC 8.0 8.1  HGB 9.6* 9.9*  HCT 30.2* 31.1*  PLT 277 298   BMET Recent Labs    05/24/20 0719 05/26/20 0632  NA 133* 135  K 3.2* 3.3*  CL 98 99  CO2 27 26  GLUCOSE 92 92  BUN 5* 6*  CREATININE 0.84 0.85  CALCIUM 7.7* 8.1*    COAG Lab Results  Component Value Date   INR 1.5 (H) 04/18/2020   INR 1.1 04/15/2020   No results found for: PTT  Assessment/Planning: 69 y.o. female s/p aortobifemoral bypass readmitted with ongoing chronic ischemic colitis, c. Difficile with malnutrition.  Plan OR tomorrow with Dr. Oneida Alar for bilateral groin debridement and vac placement.  Updated patient again on plan of care.  Please keep NPO after midnight.  Continue santyl for now as ordered by Dr. Oneida Alar.    Marty Heck 05/26/2020 9:39 AM --

## 2020-05-26 NOTE — Progress Notes (Signed)
[facility]  Subjective  - Still having some abdominal pain but wants a coke to drink.    Objective (!) 118/54 77 98.1 F (36.7 C) (Oral) 16 97%  Intake/Output Summary (Last 24 hours) at 05/26/2020 0939 Last data filed at 05/25/2020 1456 Gross per 24 hour  Intake 1603.38 ml  Output --  Net 1603.38 ml    Both groins macerated with superficial separation on incisions Midline incision c/d/i Palpable DP pulses bilaterally  Laboratory Lab Results: Recent Labs    05/24/20 0719 05/25/20 1836  WBC 8.0 8.1  HGB 9.6* 9.9*  HCT 30.2* 31.1*  PLT 277 298   BMET Recent Labs    05/24/20 0719 05/26/20 0632  NA 133* 135  K 3.2* 3.3*  CL 98 99  CO2 27 26  GLUCOSE 92 92  BUN 5* 6*  CREATININE 0.84 0.85  CALCIUM 7.7* 8.1*    COAG Lab Results  Component Value Date   INR 1.5 (H) 04/18/2020   INR 1.1 04/15/2020   No results found for: PTT  Assessment/Planning: 69 y.o. female s/p aortobifemoral bypass readmitted with ongoing chronic ischemic colitis, c. Difficile with malnutrition.  Plan OR tomorrow with Dr. Oneida Alar for bilateral groin debridement and vac placement.  Updated patient again on plan of care.  Please keep NPO after midnight.  Continue santyl for now as ordered by Dr. Oneida Alar.    Marty Heck 05/26/2020 9:39 AM --

## 2020-05-27 ENCOUNTER — Encounter (HOSPITAL_COMMUNITY)
Admission: EM | Disposition: A | Discharge status: Home / Self Care | Payer: Self-pay | Source: Home / Self Care | Attending: Family Medicine

## 2020-05-27 ENCOUNTER — Inpatient Hospital Stay (HOSPITAL_COMMUNITY): Payer: Medicare Other | Admitting: Certified Registered"

## 2020-05-27 ENCOUNTER — Encounter (HOSPITAL_COMMUNITY): Payer: Self-pay | Admitting: Family Medicine

## 2020-05-27 DIAGNOSIS — I9789 Other postprocedural complications and disorders of the circulatory system, not elsewhere classified: Secondary | ICD-10-CM

## 2020-05-27 DIAGNOSIS — I10 Essential (primary) hypertension: Secondary | ICD-10-CM | POA: Diagnosis not present

## 2020-05-27 DIAGNOSIS — I779 Disorder of arteries and arterioles, unspecified: Secondary | ICD-10-CM | POA: Diagnosis not present

## 2020-05-27 DIAGNOSIS — A0472 Enterocolitis due to Clostridium difficile, not specified as recurrent: Secondary | ICD-10-CM | POA: Diagnosis not present

## 2020-05-27 DIAGNOSIS — I5032 Chronic diastolic (congestive) heart failure: Secondary | ICD-10-CM | POA: Diagnosis not present

## 2020-05-27 HISTORY — PX: WOUND DEBRIDEMENT: SHX247

## 2020-05-27 HISTORY — PX: APPLICATION OF WOUND VAC: SHX5189

## 2020-05-27 LAB — BASIC METABOLIC PANEL
Anion gap: 8 (ref 5–15)
BUN: 7 mg/dL — ABNORMAL LOW (ref 8–23)
CO2: 26 mmol/L (ref 22–32)
Calcium: 7.8 mg/dL — ABNORMAL LOW (ref 8.9–10.3)
Chloride: 101 mmol/L (ref 98–111)
Creatinine, Ser: 0.83 mg/dL (ref 0.44–1.00)
GFR calc Af Amer: >60 mL/min (ref >60)
GFR calc non Af Amer: >60 mL/min (ref >60)
Glucose, Bld: 94 mg/dL (ref 70–99)
Potassium: 3.5 mmol/L (ref 3.5–5.1)
Sodium: 135 mmol/L (ref 135–145)

## 2020-05-27 LAB — SURGICAL PCR SCREEN
MRSA, PCR: NEGATIVE
Staphylococcus aureus: NEGATIVE

## 2020-05-27 LAB — GLUCOSE, CAPILLARY: Glucose-Capillary: 92 mg/dL (ref 70–99)

## 2020-05-27 LAB — CBC
HCT: 29.7 % — ABNORMAL LOW (ref 36.0–46.0)
Hemoglobin: 9.4 g/dL — ABNORMAL LOW (ref 12.0–15.0)
MCH: 29.3 pg (ref 26.0–34.0)
MCHC: 31.6 g/dL (ref 30.0–36.0)
MCV: 92.5 fL (ref 80.0–100.0)
Platelets: 280 10*3/uL (ref 150–400)
RBC: 3.21 MIL/uL — ABNORMAL LOW (ref 3.87–5.11)
RDW: 17.3 % — ABNORMAL HIGH (ref 11.5–15.5)
WBC: 7 10*3/uL (ref 4.0–10.5)
nRBC: 0 % (ref 0.0–0.2)

## 2020-05-27 SURGERY — DEBRIDEMENT, WOUND
Anesthesia: General | Laterality: Bilateral

## 2020-05-27 MED ORDER — FENTANYL CITRATE (PF) 100 MCG/2ML IJ SOLN
INTRAMUSCULAR | Status: DC | PRN
  Administered 2020-05-27: 100 ug via INTRAVENOUS

## 2020-05-27 MED ORDER — LABETALOL HCL 5 MG/ML IV SOLN
INTRAVENOUS | Status: AC
  Filled 2020-05-27: qty 4

## 2020-05-27 MED ORDER — FENTANYL CITRATE (PF) 250 MCG/5ML IJ SOLN
INTRAMUSCULAR | Status: AC
  Filled 2020-05-27: qty 5

## 2020-05-27 MED ORDER — LIDOCAINE 2% (20 MG/ML) 5 ML SYRINGE
INTRAMUSCULAR | Status: AC
  Filled 2020-05-27: qty 5

## 2020-05-27 MED ORDER — PROPOFOL 10 MG/ML IV BOLUS
INTRAVENOUS | Status: DC | PRN
  Administered 2020-05-27: 100 mg via INTRAVENOUS

## 2020-05-27 MED ORDER — LABETALOL HCL 5 MG/ML IV SOLN
5.0000 mg | Freq: Once | INTRAVENOUS | Status: AC
  Administered 2020-05-27: 5 mg via INTRAVENOUS

## 2020-05-27 MED ORDER — ONDANSETRON HCL 4 MG/2ML IJ SOLN
INTRAMUSCULAR | Status: DC | PRN
  Administered 2020-05-27: 4 mg via INTRAVENOUS

## 2020-05-27 MED ORDER — ACETAMINOPHEN 10 MG/ML IV SOLN: 1000.0000 mg | Freq: Once | INTRAVENOUS | Status: DC | PRN

## 2020-05-27 MED ORDER — CHLORHEXIDINE GLUCONATE 0.12 % MT SOLN: 15.0000 mL | Freq: Once | OROMUCOSAL | Status: AC

## 2020-05-27 MED ORDER — MIDAZOLAM HCL 2 MG/2ML IJ SOLN
INTRAMUSCULAR | Status: AC
  Filled 2020-05-27: qty 2

## 2020-05-27 MED ORDER — FENTANYL CITRATE (PF) 100 MCG/2ML IJ SOLN: 25.0000 ug | INTRAMUSCULAR | Status: DC | PRN

## 2020-05-27 MED ORDER — PROPOFOL 10 MG/ML IV BOLUS
INTRAVENOUS | Status: AC
  Filled 2020-05-27: qty 20

## 2020-05-27 MED ORDER — CHLORHEXIDINE GLUCONATE 0.12 % MT SOLN
OROMUCOSAL | Status: AC
  Administered 2020-05-27: 15 mL via OROMUCOSAL
  Filled 2020-05-27: qty 15

## 2020-05-27 MED ORDER — PHENYLEPHRINE 40 MCG/ML (10ML) SYRINGE FOR IV PUSH (FOR BLOOD PRESSURE SUPPORT)
PREFILLED_SYRINGE | INTRAVENOUS | Status: AC
  Filled 2020-05-27: qty 10

## 2020-05-27 MED ORDER — ONDANSETRON HCL 4 MG/2ML IJ SOLN
INTRAMUSCULAR | Status: AC
  Filled 2020-05-27: qty 2

## 2020-05-27 MED ORDER — ROCURONIUM BROMIDE 10 MG/ML (PF) SYRINGE
PREFILLED_SYRINGE | INTRAVENOUS | Status: AC
  Filled 2020-05-27: qty 10

## 2020-05-27 MED ORDER — LIDOCAINE 2% (20 MG/ML) 5 ML SYRINGE
INTRAMUSCULAR | Status: DC | PRN
  Administered 2020-05-27: 60 mg via INTRAVENOUS

## 2020-05-27 MED ORDER — LACTATED RINGERS IV SOLN: INTRAVENOUS | Status: DC

## 2020-05-27 MED ORDER — SUCCINYLCHOLINE CHLORIDE 200 MG/10ML IV SOSY
PREFILLED_SYRINGE | INTRAVENOUS | Status: DC | PRN
  Administered 2020-05-27: 70 mg via INTRAVENOUS

## 2020-05-27 MED ORDER — ONDANSETRON HCL 4 MG/2ML IJ SOLN: 4.0000 mg | Freq: Once | INTRAMUSCULAR | Status: DC | PRN

## 2020-05-27 MED ORDER — MIDAZOLAM HCL 5 MG/5ML IJ SOLN
INTRAMUSCULAR | Status: DC | PRN
  Administered 2020-05-27: 2 mg via INTRAVENOUS

## 2020-05-27 MED ORDER — 0.9 % SODIUM CHLORIDE (POUR BTL) OPTIME
TOPICAL | Status: DC | PRN
  Administered 2020-05-27: 1000 mL

## 2020-05-27 MED ORDER — PHENYLEPHRINE 40 MCG/ML (10ML) SYRINGE FOR IV PUSH (FOR BLOOD PRESSURE SUPPORT)
PREFILLED_SYRINGE | INTRAVENOUS | Status: DC | PRN
  Administered 2020-05-27: 120 ug via INTRAVENOUS
  Administered 2020-05-27 (×2): 80 ug via INTRAVENOUS

## 2020-05-27 SURGICAL SUPPLY — 29 items
CANISTER SUCT 3000ML PPV (MISCELLANEOUS) ×2 IMPLANT
CANISTER WOUND CARE 500ML ATS (WOUND CARE) ×1 IMPLANT
COVER SURGICAL LIGHT HANDLE (MISCELLANEOUS) ×2 IMPLANT
COVER WAND RF STERILE (DRAPES) ×2 IMPLANT
DRSG VAC ATS LRG SENSATRAC (GAUZE/BANDAGES/DRESSINGS) IMPLANT
DRSG VAC ATS MED SENSATRAC (GAUZE/BANDAGES/DRESSINGS) IMPLANT
DRSG VAC ATS SM SENSATRAC (GAUZE/BANDAGES/DRESSINGS) ×1 IMPLANT
ELECT REM PT RETURN 9FT ADLT (ELECTROSURGICAL) ×2
ELECTRODE REM PT RTRN 9FT ADLT (ELECTROSURGICAL) ×1 IMPLANT
GLOVE BIOGEL PI IND STRL 7.5 (GLOVE) ×1 IMPLANT
GLOVE BIOGEL PI INDICATOR 7.5 (GLOVE) ×1
GLOVE INDICATOR 7.0 STRL GRN (GLOVE) ×1 IMPLANT
GLOVE SURG SS PI 6.5 STRL IVOR (GLOVE) ×1 IMPLANT
GOWN STRL REUS W/ TWL LRG LVL3 (GOWN DISPOSABLE) ×3 IMPLANT
GOWN STRL REUS W/TWL LRG LVL3 (GOWN DISPOSABLE) ×6
HANDPIECE INTERPULSE COAX TIP (DISPOSABLE)
IV NS IRRIG 3000ML ARTHROMATIC (IV SOLUTION) IMPLANT
KIT BASIN OR (CUSTOM PROCEDURE TRAY) ×2 IMPLANT
KIT TURNOVER KIT B (KITS) ×2 IMPLANT
MANIFOLD NEPTUNE II (INSTRUMENTS) IMPLANT
NS IRRIG 1000ML POUR BTL (IV SOLUTION) ×2 IMPLANT
PACK GENERAL/GYN (CUSTOM PROCEDURE TRAY) ×2 IMPLANT
PACK UNIVERSAL I (CUSTOM PROCEDURE TRAY) IMPLANT
PAD ARMBOARD 7.5X6 YLW CONV (MISCELLANEOUS) ×4 IMPLANT
PAD NEG PRESSURE SENSATRAC (MISCELLANEOUS) ×3 IMPLANT
SET HNDPC FAN SPRY TIP SCT (DISPOSABLE) IMPLANT
SYR BULB IRRIG 60ML STRL (SYRINGE) ×1 IMPLANT
TOWEL GREEN STERILE (TOWEL DISPOSABLE) ×2 IMPLANT
WATER STERILE IRR 1000ML POUR (IV SOLUTION) IMPLANT

## 2020-05-27 NOTE — Anesthesia Preprocedure Evaluation (Addendum)
Anesthesia Evaluation  Patient identified by MRN, date of birth, ID band Patient awake    Reviewed: Allergy & Precautions, NPO status , Patient's Chart, lab work & pertinent test results  Airway Mallampati: III  TM Distance: >3 FB Neck ROM: Full    Dental  (+) Poor Dentition, Missing   Pulmonary former smoker,    Pulmonary exam normal breath sounds clear to auscultation       Cardiovascular hypertension, Pt. on home beta blockers and Pt. on medications + Peripheral Vascular Disease, +CHF and + DVT  Normal cardiovascular exam Rhythm:Regular Rate:Normal  ECG: NSR, rate 88   Neuro/Psych PSYCHIATRIC DISORDERS Anxiety Depression negative neurological ROS     GI/Hepatic Neg liver ROS, GERD  ,  Endo/Other  diabetes  Renal/GU negative Renal ROS     Musculoskeletal negative musculoskeletal ROS (+)   Abdominal   Peds  Hematology  (+) anemia , HLD   Anesthesia Other Findings POORLY HEALING GROIN WOUND  Reproductive/Obstetrics                            Anesthesia Physical Anesthesia Plan  ASA: III  Anesthesia Plan: General   Post-op Pain Management:    Induction: Intravenous  PONV Risk Score and Plan: 3 and Ondansetron, Dexamethasone and Treatment may vary due to age or medical condition  Airway Management Planned: Oral ETT  Additional Equipment:   Intra-op Plan:   Post-operative Plan: Extubation in OR  Informed Consent: I have reviewed the patients History and Physical, chart, labs and discussed the procedure including the risks, benefits and alternatives for the proposed anesthesia with the patient or authorized representative who has indicated his/her understanding and acceptance.     Dental advisory given  Plan Discussed with: CRNA  Anesthesia Plan Comments:        Anesthesia Quick Evaluation

## 2020-05-27 NOTE — Op Note (Addendum)
Procedure: Bilateral excisional groin debridement, placement of VAC dressing laterally  Preoperative diagnosis: Poorly healing groin wounds bilaterally  Postoperative diagnosis: Same  Anesthesia: General  Assistant: Risa Grill, PA-C for assistance in exposure and expediting procedure  Operative findings: Skin and fat necrosis no exposed graft  Operative details: After team informed consent, patient was taken the operating room.  She was placed in supine position on the operating table.  After induction general anesthesia both groins were prepped and draped in usual sterile fashion.  In the left groin the skin and subcutaneous tissue was debrided back to healthy bleeding tissue.  This encompassed a surface area of about 4 x 4 cm.  There was no exposed graft.  A VAC dressing was placed on this and placed at 120 mmHg.  Attention was then turned to the left groin.  In similar fashion the skin and subcutaneous tissues were debrided back to healthy-appearing tissue.  The wound on the side was about 3 x 3 cm in diameter.  A VAC was also placed on this at 120 mmHg.  The patient tolerated procedure well and there were no complications.  The instrument sponge and needle count was correct in the case.  The patient was taken the recovery room in stable condition.  Ruta Hinds, MD [facility] Office: [phone]

## 2020-05-27 NOTE — Progress Notes (Signed)
OT Cancellation Note  Patient Details Name: Tonya Hughes MRN: [id number] DOB: June 04, 1951   Cancelled Treatment:    Reason Eval/Treat Not Completed: Patient at procedure or test/ unavailable (OR for b/l wound debridement and wound vac placement)   OT to continue to follow for OT intervention.  Jefferey Pica, OTR/L Acute Rehabilitation Services Pager: [phone] Office: [phone]   Jefferey Pica 05/27/2020, 12:53 PM

## 2020-05-27 NOTE — Interval H&P Note (Signed)
History and Physical Interval Note:  05/27/2020 2:41 PM  Gilma S Blecha  has presented today for surgery, with the diagnosis of Avant.  The various methods of treatment have been discussed with the patient and family. After consideration of risks, benefits and other options for treatment, the patient has consented to  Procedure(s): BILATERAL GROIN DEBRIDEMENT (Bilateral) APPLICATION OF WOUND VAC (Bilateral) as a surgical intervention.  The patient's history has been reviewed, patient examined, no change in status, stable for surgery.  I have reviewed the patient's chart and labs.  Questions were answered to the patient's satisfaction.     Ruta Hinds

## 2020-05-27 NOTE — Anesthesia Procedure Notes (Signed)
Procedure Name: Intubation Date/Time: 05/27/2020 3:12 PM Performed by: Moshe Salisbury, CRNA Pre-anesthesia Checklist: Patient identified, Emergency Drugs available, Suction available and Patient being monitored Patient Re-evaluated:Patient Re-evaluated prior to induction Oxygen Delivery Method: Circle System Utilized Preoxygenation: Pre-oxygenation with 100% oxygen Induction Type: IV induction Ventilation: Mask ventilation without difficulty Laryngoscope Size: Mac and 3 Grade View: Grade I Tube type: Oral Tube size: 7.5 mm Number of attempts: 1 Airway Equipment and Method: Stylet Placement Confirmation: ETT inserted through vocal cords under direct vision,  positive ETCO2 and breath sounds checked- equal and bilateral Secured at: 21 cm Tube secured with: Tape Dental Injury: Teeth and Oropharynx as per pre-operative assessment

## 2020-05-27 NOTE — OR Nursing (Signed)
Patient with fecal drainage. Cleaned prior to procedure prep and again post-op. With skin redness and irritation at the buttocks.

## 2020-05-27 NOTE — Progress Notes (Addendum)
GI was recalled due to persistent abdominal pain and diarrhea.  Patient unable to be examined as she is currently in the OR for bilateral groin debridement and placement of wound VAC.  Per chart review, patient has been having continued liquid bowel movements.  No noted bloody bowel movements over the last several days per review of flowsheet.  Hemoglobin 9.4 today, stable as compared to 9.9 yesterday and 9.6 the day prior.  C. difficile antigen positive on 05/17/2020.  Repeat C. difficile PCR ordered.  Discussed with Dr. Watt Climes.  Patient may gastrograffin enema to delineate whether or not stricture is contributing to symptoms and whether or not surgical intervention is needed.  We will plan to see the patient tomorrow.

## 2020-05-27 NOTE — Progress Notes (Signed)
[facility]  PROGRESS NOTE  ANTOINE FIALLOS GYF:[id number] DOB: [birth year] DOA: 05/17/2020 PCP: Monico Blitz, MD   Brief HPI:   69 year old female with medical history of PAD s/p aortobifemoral bypass on 07/18/3845, diastolic heart failure admitted on 716 after presenting with abdominal pain and felt to have ischemic colitis.  Vascular surgery was consulted.  In the ER patient was noted to have positive C. difficile antigen but negative for toxin although she did have diarrhea.  However PCR was positive.  CT abdomen pelvis noted colitis of the descending colon and rectosigmoid with improvement of chronic changes of splenic flexure seen on prior CT.  She was started on IV antibiotics and oral vancomycin.  Also developed rectal bleeding with drop in hemoglobin requiring 2 units PRBC.  GI was consulted, underwent flexible sigmoidoscopy on 05/20/2020.  Sigmoidoscopy pathology consistent with ischemic colitis.  Pathology is currently pending.    Subjective   Patient seen and examined, continues to have diarrhea and abdominal pain.  Plan for debridement of groins with VAC placement today as per vascular surgery.   Assessment/Plan:     1. ?  Ischemic colitis-seen on flexible sigmoidoscopy, patient started on Rocephin and Flagyl.  Patient continues to have diarrhea with abdominal pain.  Pathology from rectosigmoid biopsy showed chronic active nonspecific colitis with ulceration, negative for granulomata and dysplasia.  Findings are compatible with with chronic ischemic colitis.  Differential include infection, drug effect and stercoral proctitis.  IV ceftriaxone has been discontinued.  Continue with IV Flagyl.  I called and discussed with [facility], Dr. Watt Climes will  see patient today. 2. Probable C. difficile colitis-C. difficile testing was positive for antigen but negative for toxin, PCR was positive.  Patient presented with diarrhea.  She was started on oral vancomycin since 05/17/2020 for 10  days. 3. Hypokalemia-potassium is 3.3 today.  Will replace potassium and follow BMP in a.m.   4. Rectal bleeding/anemia-patient had rectal bleed starting 05/18/2020.  Hemoglobin dropped to 6.2 on 17/21, required 2 units PRBC.  Hemoglobin has improved to 9.6, follow CBC in a.m. and transfuse for hemoglobin less than 7. 5. Status post aortobifemoral bypass-vascular surgery following.  She has poor wound healing in both groins.  Vascular surgery planning to take her to the OR today. 6. Acute kidney injury on CKD stage II-resolved, creatinine back to baseline. 7. Hypertension-antihypertensives including  lisinopril and Coreg were on hold since admission.  Blood pressure is now elevated.  Will restart these medications. 8. Chronic diastolic CHF-patient has ongoing diarrhea, will hold Lasix.   Scheduled medications:    [MAR Hold] atorvastatin  40 mg Oral QHS   [MAR Hold] carvedilol  3.125 mg Oral BID   [MAR Hold] collagenase   Topical Daily   [MAR Hold] lisinopril  5 mg Oral Daily   [MAR Hold] loratadine  10 mg Oral Daily   [MAR Hold] PARoxetine  20 mg Oral Daily   [MAR Hold] sodium chloride flush  10-40 mL Intracatheter Q12H   [MAR Hold] vancomycin  125 mg Oral QID         CBG: Recent Labs  Lab 05/22/20 1205 05/22/20 1717 05/22/20 2038 05/23/20 0005 05/23/20 0803  GLUCAP 122* 97 100* 89 93    SpO2: 98 %    CBC: Recent Labs  Lab 05/22/20 0834 05/23/20 0250 05/24/20 0719 05/25/20 1836 05/27/20 0745  WBC 6.1 6.2 8.0 8.1 7.0  NEUTROABS 3.3  --   --   --   --   HGB 9.1*  8.5* 9.6* 9.9* 9.4*  HCT 29.0* 26.5* 30.2* 31.1* 29.7*  MCV 92.4 91.1 92.1 91.2 92.5  PLT 250 253 277 298 165    Basic Metabolic Panel: Recent Labs  Lab 05/21/20 0323 05/22/20 0834 05/24/20 0719 05/26/20 0632 05/27/20 0745  NA 132* 134* 133* 135 135  K 3.8 3.5 3.2* 3.3* 3.5  CL 99 100 98 99 101  CO2 23 26 27 26 26   GLUCOSE 86 92 92 92 94  BUN 7* <5* 5* 6* 7*  CREATININE 0.78 0.81 0.84  0.85 0.83  CALCIUM 7.6* 7.9* 7.7* 8.1* 7.8*  MG  --  1.7  --   --   --      Liver Function Tests: No results for input(s): AST, ALT, ALKPHOS, BILITOT, PROT, ALBUMIN in the last 168 hours.   Antibiotics: Anti-infectives (From admission, onward)   Start     Dose/Rate Route Frequency Ordered Stop   05/24/20 1630  [MAR Hold]  vancomycin (VANCOCIN) 50 mg/mL oral solution 125 mg     Discontinue     (MAR Hold since Mon 05/27/2020 at 1225.Hold Reason: Transfer to a Procedural area.)   125 mg Oral 4 times daily 05/24/20 1535 06/03/20 1759   05/18/20 0830  [MAR Hold]  metroNIDAZOLE (FLAGYL) IVPB 500 mg     Discontinue     (MAR Hold since Mon 05/27/2020 at 1225.Hold Reason: Transfer to a Procedural area.)   500 mg 100 mL/hr over 60 Minutes Intravenous Every 8 hours 05/18/20 0802     05/17/20 2345  vancomycin (VANCOCIN) 50 mg/mL oral solution 125 mg  Status:  Discontinued        125 mg Oral 4 times daily 05/17/20 2326 05/24/20 1527   05/17/20 2345  cefTRIAXone (ROCEPHIN) 2 g in sodium chloride 0.9 % 100 mL IVPB  Status:  Discontinued        2 g 200 mL/hr over 30 Minutes Intravenous Every 24 hours 05/17/20 2335 05/24/20 1226   05/17/20 2330  ciprofloxacin (CIPRO) IVPB 400 mg  Status:  Discontinued        400 mg 200 mL/hr over 60 Minutes Intravenous Every 12 hours 05/17/20 2301 05/17/20 2334   05/17/20 2300  metroNIDAZOLE (FLAGYL) IVPB 500 mg        500 mg 100 mL/hr over 60 Minutes Intravenous  Once 05/17/20 2251 05/18/20 0131       DVT prophylaxis: Initially started on heparin, discontinued 05/18/2020.  Started on SCDs.  Code Status: Full code  Family Communication: No family at bedside    Status is: Inpatient  Dispo: The patient is from: Home              Anticipated d/c is to: Home              Anticipated d/c date is: 05/30/20              Patient currently ongoing abdominal pain, diarrhea, ischemic colitis, poor wound healing after aortobifemoral bypass.  Barrier to  discharge-ongoing diarrhea, abdominal pain.  Vascular surgery planning to take her to the OR if continues to have poor wound healing.  Pressure Injury 04/27/20 Heel Left Deep Tissue Pressure Injury - Purple or maroon localized area of discolored intact skin or blood-filled blister due to damage of underlying soft tissue from pressure and/or shear. (Active)  04/27/20 2110  Location: Heel  Location Orientation: Left  Staging: Deep Tissue Pressure Injury - Purple or maroon localized area of discolored intact skin or blood-filled blister due  to damage of underlying soft tissue from pressure and/or shear.  Wound Description (Comments):   Present on Admission:      Consultants:  Vascular surgery  Procedures:  Flexible sigmoidoscopy on 05/20/2020-showed findings of ischemic colitis.     Objective   Vitals:   05/26/20 1400 05/26/20 2156 05/27/20 0625 05/27/20 1228  BP: (!) 119/54 (!) 128/44 111/69   Pulse: 72 67 72   Resp: 17 18 18    Temp: 98.2 F (36.8 C) 98.1 F (36.7 C) 98.2 F (36.8 C)   TempSrc: Oral Oral Oral   SpO2: 97% 99% 98%   Weight:    73.4 kg  Height:    5' 2.99" (1.6 m)   No intake or output data in the 24 hours ending 05/27/20 1509  07/24 1901 - 07/26 0700 In: 600 [P.O.:600] Out: -   Filed Weights   05/19/20 0423 05/27/20 1228  Weight: 73.4 kg 73.4 kg    Physical Examination:  General-appears in no acute distress Heart-S1-S2, regular, no murmur auscultated Lungs-clear to auscultation bilaterally, no wheezing or crackles auscultated Abdomen-soft, mild tenderness to palpation Extremities-no edema in the lower extremities Neuro-alert, oriented x3, no focal deficit noted   Data Reviewed:   Recent Results (from the past 240 hour(s))  C Difficile Quick Screen w PCR reflex     Status: Abnormal   Collection Time: 05/17/20  7:49 PM   Specimen: STOOL  Result Value Ref Range Status   C Diff antigen POSITIVE (A) NEGATIVE Final   C Diff toxin NEGATIVE  NEGATIVE Final   C Diff interpretation Results are indeterminate. See PCR results.  Final    Comment: Performed at [facility], [place] [address], [place], [place] [address]  C. Diff by PCR, Reflexed     Status: Abnormal   Collection Time: 05/17/20  7:49 PM  Result Value Ref Range Status   Toxigenic C. Difficile by PCR POSITIVE (A) NEGATIVE Final    Comment: Positive for toxigenic C. difficile with little to no toxin production. Only treat if clinical presentation suggests symptomatic illness. Performed at [facility], [place] [address] [place], [place] [address]   SARS Coronavirus 2 by RT PCR (hospital order, performed in [facility] hospital lab) Nasopharyngeal Nasopharyngeal Swab     Status: None   Collection Time: 05/18/20 12:16 AM   Specimen: Nasopharyngeal Swab  Result Value Ref Range Status   SARS Coronavirus 2 NEGATIVE NEGATIVE Final    Comment: (NOTE) SARS-CoV-2 target nucleic acids are NOT DETECTED.  The SARS-CoV-2 RNA is generally detectable in upper and lower respiratory specimens during the acute phase of infection. The lowest concentration of SARS-CoV-2 viral copies this assay can detect is 250 copies / mL. A negative result does not preclude SARS-CoV-2 infection and should not be used as the sole basis for treatment or other patient management decisions.  A negative result may occur with improper specimen collection / handling, submission of specimen other than nasopharyngeal swab, presence of viral mutation(s) within the areas targeted by this assay, and inadequate number of viral copies (<250 copies / mL). A negative result must be combined with clinical observations, patient history, and epidemiological information.  Fact Sheet for Patients:   StrictlyIdeas.no  Fact Sheet for Healthcare Providers: BankingDealers.co.za  This test is not yet approved or  cleared by the [place] FDA and has been authorized  for detection and/or diagnosis of SARS-CoV-2 by FDA under an Emergency Use Authorization (EUA).  This EUA will remain  in effect (meaning this test can be used) for the duration of the COVID-19 declaration under Section 564(b)(1) of the Act, 21 U.S.C. section 360bbb-3(b)(1), unless the authorization is terminated or revoked sooner.  Performed at [facility], [place] [address] [place], [place] [address]   Surgical pcr screen     Status: None   Collection Time: 05/27/20 12:37 AM   Specimen: Nasal Mucosa; Nasal Swab  Result Value Ref Range Status   MRSA, PCR NEGATIVE NEGATIVE Final   Staphylococcus aureus NEGATIVE NEGATIVE Final    Comment: (NOTE) The Xpert SA Assay (FDA approved for NASAL specimens in patients 94 years of age and older), is one component of a comprehensive surveillance program. It is not intended to diagnose infection nor to guide or monitor treatment. Performed at [facility], [place] [address], [place], [place] [address]        [facility] If 7PM-7AM, please contact night-coverage at [email or web address], Office  [phone]   05/27/2020, 3:09 PM  LOS: 10 days

## 2020-05-27 NOTE — Anesthesia Postprocedure Evaluation (Signed)
Anesthesia Post Note  Patient: Tonya Hughes  Procedure(s) Performed: BILATERAL GROIN DEBRIDEMENT (Bilateral ) APPLICATION OF WOUND VAC (Bilateral )     Patient location during evaluation: PACU Anesthesia Type: General Level of consciousness: awake Pain management: pain level controlled Vital Signs Assessment: post-procedure vital signs reviewed and stable Respiratory status: spontaneous breathing, nonlabored ventilation, respiratory function stable and patient connected to nasal cannula oxygen Cardiovascular status: blood pressure returned to baseline and stable Postop Assessment: no apparent nausea or vomiting Anesthetic complications: no   No complications documented.  Last Vitals:  Vitals:   05/27/20 1635 05/27/20 1651  BP: (!) 145/67 (!) 159/67  Pulse: 66 67  Resp: 15 16  Temp:  36.7 C  SpO2: 95% 99%    Last Pain:  Vitals:   05/27/20 1706  TempSrc:   PainSc: 0-No pain                 [name] P Hiep Ollis

## 2020-05-27 NOTE — Transfer of Care (Signed)
Immediate Anesthesia Transfer of Care Note  Patient: Tonya Hughes  Procedure(s) Performed: BILATERAL GROIN DEBRIDEMENT (Bilateral ) APPLICATION OF WOUND VAC (Bilateral )  Patient Location: PACU  Anesthesia Type:General  Level of Consciousness: awake and patient cooperative  Airway & Oxygen Therapy: Patient Spontanous Breathing and Patient connected to nasal cannula oxygen  Post-op Assessment: Report given to RN, Post -op Vital signs reviewed and stable and Patient moving all extremities  Post vital signs: Reviewed and stable  Last Vitals:  Vitals Value Taken Time  BP 165/71 05/27/20 1609  Temp    Pulse 70 05/27/20 1611  Resp 14 05/27/20 1611  SpO2 100 % 05/27/20 1611  Vitals shown include unvalidated device data.  Last Pain:  Vitals:   05/27/20 0843  TempSrc:   PainSc: 0-No pain      Patients Stated Pain Goal: 2 (21/03/12 8118)  Complications: No complications documented.

## 2020-05-27 NOTE — TOC Progression Note (Signed)
Transition of Care [facility]) - Progression Note    Patient Details  Name: Tonya Hughes MRN: [id number] Date of Birth: [birth year]  Transition of Care [facility]) CM/SW Contact  Jacalyn Lefevre Edson Snowball, RN Phone Number: 05/27/2020, 11:26 AM  Clinical Narrative:     Plan for  bilateral groin debridement and vac placement. Staff currently providing personnel care to patient. Tracie Harrier with [facility]. Also placed a home VAC application on shadow chart for MD/PA signature.   Expected Discharge Plan: [place] Barriers to Discharge: Continued Medical Work up  Expected Discharge Plan and Services Expected Discharge Plan: [place]   Discharge Planning Services: CM Consult Post Acute Care Choice: [place] arrangements for the past 2 months: Single Family Home                 DME Arranged: N/A DME Agency: NA       HH Arranged: RN, Disease Management, PT, OT [place] Agency: [place] ([facility]) Date HH Agency Contacted: 05/21/20 Time [place]: [place] Representative spoke with at [place]: [place] ([place]) Interventions    Readmission Risk Interventions No flowsheet data found.

## 2020-05-28 ENCOUNTER — Inpatient Hospital Stay (HOSPITAL_COMMUNITY): Payer: Medicare Other

## 2020-05-28 ENCOUNTER — Encounter (HOSPITAL_COMMUNITY): Payer: Self-pay | Admitting: Vascular Surgery

## 2020-05-28 DIAGNOSIS — A0472 Enterocolitis due to Clostridium difficile, not specified as recurrent: Secondary | ICD-10-CM | POA: Diagnosis not present

## 2020-05-28 DIAGNOSIS — I10 Essential (primary) hypertension: Secondary | ICD-10-CM | POA: Diagnosis not present

## 2020-05-28 DIAGNOSIS — I779 Disorder of arteries and arterioles, unspecified: Secondary | ICD-10-CM | POA: Diagnosis not present

## 2020-05-28 DIAGNOSIS — I5032 Chronic diastolic (congestive) heart failure: Secondary | ICD-10-CM | POA: Diagnosis not present

## 2020-05-28 LAB — CBC
HCT: 29.6 % — ABNORMAL LOW (ref 36.0–46.0)
Hemoglobin: 9.3 g/dL — ABNORMAL LOW (ref 12.0–15.0)
MCH: 29.7 pg (ref 26.0–34.0)
MCHC: 31.4 g/dL (ref 30.0–36.0)
MCV: 94.6 fL (ref 80.0–100.0)
Platelets: 280 10*3/uL (ref 150–400)
RBC: 3.13 MIL/uL — ABNORMAL LOW (ref 3.87–5.11)
RDW: 17.4 % — ABNORMAL HIGH (ref 11.5–15.5)
WBC: 7.4 10*3/uL (ref 4.0–10.5)
nRBC: 0 % (ref 0.0–0.2)

## 2020-05-28 MED ORDER — IOHEXOL 300 MG/ML  SOLN
450.0000 mL | Freq: Once | INTRAMUSCULAR | Status: AC | PRN
  Administered 2020-05-28: 450 mL

## 2020-05-28 NOTE — Progress Notes (Signed)
[facility] Progress Note  Subjective: Patient complains of ongoing generalized abdominal pain.  On July 19 she had a flexible sigmoidoscopy showing evidence of ischemic colitis with most likely a developing stricture in the distal sigmoid in which the endoscope was not able to be passed through.  Patient has been treated for C. difficile.  Objective: Vital signs in last 24 hours: Temp:  [97.1 F (36.2 C)-98.2 F (36.8 C)] 97.7 F (36.5 C) (07/27 0552) Pulse Rate:  [66-76] 67 (07/27 0552) Resp:  [15-18] 18 (07/27 0552) BP: (114-178)/(64-71) 155/64 (07/27 0552) SpO2:  [95 %-100 %] 98 % (07/27 0552) Weight:  [73.4 kg] 73.4 kg (07/26 1228) Weight change:    PE:  Abdomen with a diffuse tenderness.  Lab Results: Results for orders placed or performed during the hospital encounter of 05/17/20 (from the past 24 hour(s))  Glucose, capillary     Status: None   Collection Time: 05/27/20  4:10 PM  Result Value Ref Range   Glucose-Capillary 92 70 - 99 mg/dL  CBC     Status: Abnormal   Collection Time: 05/28/20  2:12 AM  Result Value Ref Range   WBC 7.4 4.0 - 10.5 K/uL   RBC 3.13 (L) 3.87 - 5.11 MIL/uL   Hemoglobin 9.3 (L) 12.0 - 15.0 g/dL   HCT 29.6 (L) 36 - 46 %   MCV 94.6 80.0 - 100.0 fL   MCH 29.7 26.0 - 34.0 pg   MCHC 31.4 30.0 - 36.0 g/dL   RDW 17.4 (H) 11.5 - 15.5 %   Platelets 280 150 - 400 K/uL   nRBC 0.0 0.0 - 0.2 %    Studies/Results: No results found.    Assessment: Ischemic colitis distal sigmoid stricture  Plan:   This will either resolve over time or will not.  If it does not then surgical resection will probably be required of the stricture.  It can be further investigated with a Gastrografin enema.    SAM F Querida Beretta 05/28/2020, 9:22 AM  Pager: [phone] If no answer or after 5 PM call [phone]

## 2020-05-28 NOTE — Progress Notes (Signed)
[facility]  PROGRESS NOTE  Tonya Hughes EGB:[id number] DOB: 1951-07-23 DOA: 05/17/2020 PCP: Monico Blitz, MD   Brief HPI:   69 year old female with medical history of PAD s/p aortobifemoral bypass on 3/71/0626, diastolic heart failure admitted on 716 after presenting with abdominal pain and felt to have ischemic colitis.  Vascular surgery was consulted.  In the ER patient was noted to have positive C. difficile antigen but negative for toxin although she did have diarrhea.  However PCR was positive.  CT abdomen pelvis noted colitis of the descending colon and rectosigmoid with improvement of chronic changes of splenic flexure seen on prior CT.  She was started on IV antibiotics and oral vancomycin.  Also developed rectal bleeding with drop in hemoglobin requiring 2 units PRBC.  GI was consulted, underwent flexible sigmoidoscopy on 05/20/2020.  Sigmoidoscopy pathology consistent with ischemic colitis.  Pathology is currently pending.    Subjective   Patient seen and examined, s/p b/l groin debridement in OR yesterday. Still has abdominal pain, diarrhea is improving. Gi has seen the patient and recommending gastrografin enema for ischemic colitis and sigmoid stricture.   Assessment/Plan:     1. ? Ischemic colitis-seen on flexible sigmoidoscopy, patient started on Rocephin and Flagyl.  Patient continues to have diarrhea with abdominal pain.  Pathology from rectosigmoid biopsy showed chronic active nonspecific colitis with ulceration, negative for granulomata and dysplasia.  Findings are compatible with with chronic ischemic colitis.  Differential include infection, drug effect and stercoral proctitis.  IV ceftriaxone has been discontinued.  Continue with IV Flagyl.  I called and discussed with [facility],  Dr Penelope Coop has seen the patient and ordered  gastrograffin enema for ischemic colitis and sigmoid stricture. Might need to involve general surgery, based on gastrografin nema results. 2. C.  difficile testing was positive for antigen but negative for toxin, PCR was positive.  Patient presented with diarrhea.  She was started on oral vancomycin since 05/17/2020 for 10 days. 3. Hypokalemia- replete.  4. Rectal bleeding/anemia-patient had rectal bleed starting 05/18/2020.  Hemoglobin dropped to 6.2 on 17/21, required 2 units PRBC.  Hemoglobin has improved to 9.6, follow CBC in a.m. and transfuse for hemoglobin less than 7. 5. Status post aortobifemoral bypass-vascular surgery following.  She has poor wound healing in both groins.  S/p b/l groin debridement in OR yesterday.Vascular surgery  following. 6. Acute kidney injury on CKD stage II-resolved, creatinine back to baseline. 7. Hypertension- bp is controlled, continue lisinopril and Coreg . 8. Chronic diastolic CHF-patient has ongoing diarrhea,  Lasix on hold.   Scheduled medications:   . atorvastatin  40 mg Oral QHS  . carvedilol  3.125 mg Oral BID  . collagenase   Topical Daily  . lisinopril  5 mg Oral Daily  . loratadine  10 mg Oral Daily  . PARoxetine  20 mg Oral Daily  . sodium chloride flush  10-40 mL Intracatheter Q12H  . vancomycin  125 mg Oral QID         CBG: Recent Labs  Lab 05/22/20 1717 05/22/20 2038 05/23/20 0005 05/23/20 0803 05/27/20 1610  GLUCAP 97 100* 89 93 92    SpO2: 98 %    CBC: Recent Labs  Lab 05/22/20 0834 05/22/20 0834 05/23/20 0250 05/24/20 0719 05/25/20 1836 05/27/20 0745 05/28/20 0212  WBC 6.1   < > 6.2 8.0 8.1 7.0 7.4  NEUTROABS 3.3  --   --   --   --   --   --   HGB  9.1*   < > 8.5* 9.6* 9.9* 9.4* 9.3*  HCT 29.0*   < > 26.5* 30.2* 31.1* 29.7* 29.6*  MCV 92.4   < > 91.1 92.1 91.2 92.5 94.6  PLT 250   < > 253 277 298 280 280   < > = values in this interval not displayed.    Basic Metabolic Panel: Recent Labs  Lab 05/22/20 0834 05/24/20 0719 05/26/20 0632 05/27/20 0745  NA 134* 133* 135 135  K 3.5 3.2* 3.3* 3.5  CL 100 98 99 101  CO2 26 27 26 26   GLUCOSE 92 92  92 94  BUN <5* 5* 6* 7*  CREATININE 0.81 0.84 0.85 0.83  CALCIUM 7.9* 7.7* 8.1* 7.8*  MG 1.7  --   --   --      Liver Function Tests: No results for input(s): AST, ALT, ALKPHOS, BILITOT, PROT, ALBUMIN in the last 168 hours.   Antibiotics: Anti-infectives (From admission, onward)   Start     Dose/Rate Route Frequency Ordered Stop   05/24/20 1630  vancomycin (VANCOCIN) 50 mg/mL oral solution 125 mg     Discontinue     125 mg Oral 4 times daily 05/24/20 1535 06/03/20 1759   05/18/20 0830  metroNIDAZOLE (FLAGYL) IVPB 500 mg     Discontinue     500 mg 100 mL/hr over 60 Minutes Intravenous Every 8 hours 05/18/20 0802     05/17/20 2345  vancomycin (VANCOCIN) 50 mg/mL oral solution 125 mg  Status:  Discontinued        125 mg Oral 4 times daily 05/17/20 2326 05/24/20 1527   05/17/20 2345  cefTRIAXone (ROCEPHIN) 2 g in sodium chloride 0.9 % 100 mL IVPB  Status:  Discontinued        2 g 200 mL/hr over 30 Minutes Intravenous Every 24 hours 05/17/20 2335 05/24/20 1226   05/17/20 2330  ciprofloxacin (CIPRO) IVPB 400 mg  Status:  Discontinued        400 mg 200 mL/hr over 60 Minutes Intravenous Every 12 hours 05/17/20 2301 05/17/20 2334   05/17/20 2300  metroNIDAZOLE (FLAGYL) IVPB 500 mg        500 mg 100 mL/hr over 60 Minutes Intravenous  Once 05/17/20 2251 05/18/20 0131       DVT prophylaxis: Initially started on heparin, discontinued 05/18/2020.  Started on SCDs.  Code Status: Full code  Family Communication: No family at bedside    Status is: Inpatient  Dispo: The patient is from: Home              Anticipated d/c is to: Home              Anticipated d/c date is: 05/30/20              Patient currently ongoing abdominal pain, diarrhea, ischemic colitis, poor wound healing after aortobifemoral bypass.  Barrier to discharge-ongoing diarrhea, abdominal pain.    Pressure Injury 04/27/20 Heel Left Deep Tissue Pressure Injury - Purple or maroon localized area of discolored intact skin  or blood-filled blister due to damage of underlying soft tissue from pressure and/or shear. (Active)  04/27/20 2110  Location: Heel  Location Orientation: Left  Staging: Deep Tissue Pressure Injury - Purple or maroon localized area of discolored intact skin or blood-filled blister due to damage of underlying soft tissue from pressure and/or shear.  Wound Description (Comments):   Present on Admission:      Consultants:  Vascular surgery  Procedures:  Flexible  sigmoidoscopy on 05/20/2020-showed findings of ischemic colitis.     Objective   Vitals:   05/27/20 1635 05/27/20 1651 05/27/20 2018 05/28/20 0552  BP: (!) 145/67 (!) 159/67 114/70 (!) 155/64  Pulse: 66 67 69 67  Resp: 15 16 16 18   Temp:  98 F (36.7 C) 98.2 F (36.8 C) 97.7 F (36.5 C)  TempSrc:   Oral Oral  SpO2: 95% 99% 99% 98%  Weight:      Height:        Intake/Output Summary (Last 24 hours) at 05/28/2020 0953 Last data filed at 05/28/2020 6579 Gross per 24 hour  Intake 1530.03 ml  Output 15 ml  Net 1515.03 ml    07/25 1901 - 07/27 0700 In: 1180 [P.O.:480] Out: 15   Filed Weights   05/19/20 0423 05/27/20 1228  Weight: 73.4 kg 73.4 kg    Physical Examination:    General: Appears in no acute distress  Cardiovascular: S1-S2, regular, no murmur auscultated  Respiratory: Clear to auscultation bilaterally  Abdomen: soft, mildly tender to palpation  Extremities: No edema in the lower extremities  Neurologic: Cranial nerves II through XII grossly intact, no focal deficit noted    Data Reviewed:   Recent Results (from the past 240 hour(s))  Surgical pcr screen     Status: None   Collection Time: 05/27/20 12:37 AM   Specimen: Nasal Mucosa; Nasal Swab  Result Value Ref Range Status   MRSA, PCR NEGATIVE NEGATIVE Final   Staphylococcus aureus NEGATIVE NEGATIVE Final    Comment: (NOTE) The Xpert SA Assay (FDA approved for NASAL specimens in patients 46 years of age and older), is one  component of a comprehensive surveillance program. It is not intended to diagnose infection nor to guide or monitor treatment. Performed at [facility], [place] [address], [place], [place] [address]        [facility] If 7PM-7AM, please contact night-coverage at [email or web address], Office  [phone]   05/28/2020, 9:53 AM  LOS: 11 days

## 2020-05-28 NOTE — TOC Progression Note (Signed)
Transition of Care [facility]) - Progression Note    Patient Details  Name: Tonya Hughes MRN: [id number] Date of Birth: 09-Jan-1951  Transition of Care [facility]) CM/SW Contact  Jacalyn Lefevre Edson Snowball, RN Phone Number: 05/28/2020, 10:25 AM  Clinical Narrative:     Continuing to follow for discharge needs. Home VAC application on chart for signature. Will follow up after dressing change tomorrow for measurements   Expected Discharge Plan: [place] Barriers to Discharge: Continued Medical Work up  Expected Discharge Plan and Services Expected Discharge Plan: [place]   Discharge Planning Services: CM Consult Post Acute Care Choice: [place] arrangements for the past 2 months: Single Family Home                 DME Arranged: N/A DME Agency: NA       HH Arranged: RN, Disease Management, PT, OT [place] Agency: [place] ([facility]) Date HH Agency Contacted: 05/21/20 Time [place]: [place] Representative spoke with at [place]: [place] ([place]) Interventions    Readmission Risk Interventions No flowsheet data found.

## 2020-05-28 NOTE — Progress Notes (Signed)
Physical Therapy Treatment Patient Details Name: Tonya Hughes MRN: [id number] DOB: August 29, 1951 Today's Date: 05/28/2020    History of Present Illness Pt is a 69 year old woman admitted one day after d/c from SNF with abdominal pain and diarrhea, hyponatremia and AKI. Possible ischemic colitis. Pt discharged from [facility] on 04/29/20 after admission for aortobifemoral BPG with complication of sigmoid ischemia. PMH: CHF, COPD, MDD, HTN, HLD.      PT Comments    Pt supine in bed attempting to move to edge of bed but having difficulty.  PTA moved bed into flat position to improve ease and provided min guard to rise into sitting at edge of bed.  Pt requested to sit here to eat her supper.  PTA educated this was good practice for all meals to improve strength.  Continue to recommend HHPT.     Follow Up Recommendations  Home health PT;Supervision/Assistance - 24 hour     Equipment Recommendations  None recommended by PT    Recommendations for Other Services Rehab consult     Precautions / Restrictions Precautions Precautions: Fall Precaution Comments: pressure wound on left heel, c-diff diarrhea, Bilat wounds to groin with wound vac    Mobility  Bed Mobility Overal bed mobility: Needs Assistance Bed Mobility: Rolling;Sidelying to Sit Rolling: Supervision Sidelying to sit: Min guard       General bed mobility comments: Min guard to rise into sitting at edge of bed.  Pt requested to sit edge of bed to eat her supper.  Transfers                    Ambulation/Gait                 Stairs             Wheelchair Mobility    Modified Rankin (Stroke Patients Only)       Balance     Sitting balance-Leahy Scale: Good Sitting balance - Comments: no LOB                                    Cognition Arousal/Alertness: Awake/alert Behavior During Therapy: Flat affect;Agitated Overall Cognitive Status: Within Functional Limits for tasks assessed                                         Exercises      General Comments        Pertinent Vitals/Pain Pain Assessment: Faces Faces Pain Scale: Hurts little more Pain Location: abdomen/ buttocks Pain Descriptors / Indicators: Sore;Aching;Discomfort;Guarding Pain Intervention(s): Limited activity within patient's tolerance    Home Living                      Prior Function            PT Goals (current goals can now be found in the care plan section) Acute Rehab PT Goals Patient Stated Goal: to go home Potential to Achieve Goals: Fair Progress towards PT goals: Not progressing toward goals - comment    Frequency    Min 3X/week      PT Plan Current plan remains appropriate    Co-evaluation              AM-PAC PT "6 Clicks" Mobility   Outcome Measure  Help needed turning from your back to your side while in a flat bed without using bedrails?: None Help needed moving from lying on your back to sitting on the side of a flat bed without using bedrails?: A Little Help needed moving to and from a bed to a chair (including a wheelchair)?: A Little Help needed standing up from a chair using your arms (e.g., wheelchair or bedside chair)?: A Little Help needed to walk in hospital room?: A Little Help needed climbing 3-5 steps with a railing? : A Little 6 Click Score: 19    End of Session Equipment Utilized During Treatment: Gait belt Activity Tolerance: Patient tolerated treatment well Patient left: with bed alarm set;in bed Nurse Communication: Mobility status PT Visit Diagnosis: Other abnormalities of gait and mobility (R26.89);Difficulty in walking, not elsewhere classified (R26.2);Muscle weakness (generalized) (M62.81);Other symptoms and signs involving the nervous system (R29.898);Pain Pain - Right/Left: Left Pain - part of body: Leg     Time: 0349-6116 PT Time Calculation (min) (ACUTE ONLY): 8 min  Charges:  $Therapeutic Activity:  8-22 mins                     Erasmo Leventhal , PTA Acute Rehabilitation Services Pager [phone] Office [phone]     Kishon Garriga Eli Hose 05/28/2020, 4:59 PM

## 2020-05-28 NOTE — Progress Notes (Addendum)
  Progress Note    05/28/2020 7:08 AM 1 Day Post-Op  Subjective:  "When can I go home?" POD 1 bilateral groin debridement. She was incontinent of stool yesterday when transferring to OR table.  Vitals:   05/27/20 2018 05/28/20 0552  BP: 114/70 (!) 155/64  Pulse: 69 67  Resp: 16 18  Temp: 98.2 F (36.8 C) 97.7 F (36.5 C)  SpO2: 99% 98%    Physical Exam: Cardiac:  RRR Lungs:  nonlabored Incisions:  VAC dressings in place B groins with good seal Extremities:  Feet warm, motor function sensation intact. Foam border heel dressing on left heel.  approx 2-3 cm purple discoloration of heel with intact skin   CBC    Component Value Date/Time   WBC 7.4 05/28/2020 0212   RBC 3.13 (L) 05/28/2020 0212   HGB 9.3 (L) 05/28/2020 0212   HCT 29.6 (L) 05/28/2020 0212   PLT 280 05/28/2020 0212   MCV 94.6 05/28/2020 0212   MCH 29.7 05/28/2020 0212   MCHC 31.4 05/28/2020 0212   RDW 17.4 (H) 05/28/2020 0212   LYMPHSABS 2.2 05/22/2020 0834   MONOABS 0.4 05/22/2020 0834   EOSABS 0.1 05/22/2020 0834   BASOSABS 0.0 05/22/2020 0834    BMET    Component Value Date/Time   NA 135 05/27/2020 0745   K 3.5 05/27/2020 0745   CL 101 05/27/2020 0745   CO2 26 05/27/2020 0745   GLUCOSE 94 05/27/2020 0745   BUN 7 (L) 05/27/2020 0745   CREATININE 0.83 05/27/2020 0745   CALCIUM 7.8 (L) 05/27/2020 0745   GFRNONAA >60 05/27/2020 0745   GFRAA >60 05/27/2020 0745     Intake/Output Summary (Last 24 hours) at 05/28/2020 0708 Last data filed at 05/27/2020 1805 Gross per 24 hour  Intake 1180.03 ml  Output 15 ml  Net 1165.03 ml    HOSPITAL MEDICATIONS Scheduled Meds: . atorvastatin  40 mg Oral QHS  . carvedilol  3.125 mg Oral BID  . collagenase   Topical Daily  . lisinopril  5 mg Oral Daily  . loratadine  10 mg Oral Daily  . PARoxetine  20 mg Oral Daily  . sodium chloride flush  10-40 mL Intracatheter Q12H  . vancomycin  125 mg Oral QID   Continuous Infusions: . lactated ringers 10  mL/hr at 05/20/20 1152  . metronidazole 500 mg (05/28/20 0004)   PRN Meds:.acetaminophen, albuterol, fentaNYL (SUBLIMAZE) injection, ondansetron (ZOFRAN) IV, sodium chloride flush  Assessment: Poor healing of bilateral groin incisions s/p debridement. Pressure injury left heel C. Diff diarrhea   Plan: -continue VAC dressings>>change tomorrow -Off load left heel with pressure relief boot -On oral Vanc   Risa Grill, PA-C [facility] [phone] 05/28/2020  7:08 AM   Agree with above.  Ruta Hinds, MD [facility] [place] Office: [phone]

## 2020-05-28 NOTE — Progress Notes (Signed)
Occupational Therapy Treatment Patient Details Name: Tonya Hughes MRN: [id number] DOB: 08-07-1951 Today's Date: 05/28/2020    History of present illness Pt is a 69 year old woman admitted one day after d/c from SNF with abdominal pain and diarrhea, hyponatremia and AKI. Possible ischemic colitis. Pt discharged from [facility] on 04/29/20 after admission for aortobifemoral BPG with complication of sigmoid ischemia. PMH: CHF, COPD, MDD, HTN, HLD.     OT comments  Pt progressing to OOB ADL to focus on toilet hygiene, transfers and bed mobility. Pt following 1-2 step commands with redirection required for frequent irritable bursts of energy and complaints of current stay. Pt continues to require minA overall for mobility with RW and set-upA to [place] for ADL tasks. Pt would benefit from continued OT skilled services. OT following acutely.    Follow Up Recommendations  Home health OT;Supervision/Assistance - 24 hour    Equipment Recommendations  None recommended by OT    Recommendations for Other Services      Precautions / Restrictions Precautions Precautions: Fall Precaution Comments: pressure wound on left heel, c-diff diarrhea, Bilat wounds to groin with wound vac Restrictions Weight Bearing Restrictions: No       Mobility Bed Mobility Overal bed mobility: Needs Assistance Bed Mobility: Rolling;Sidelying to Sit;Sit to Supine Rolling: Supervision Sidelying to sit: Min guard   Sit to supine: Min guard   General bed mobility comments: MinguardA for stability for elevating trunk and for proper hand placement; pt  requiring minguardA to lift legs onto bed to conclude session.  Transfers Overall transfer level: Needs assistance Equipment used: Rolling walker (2 wheeled) Transfers: Sit to/from Stand Sit to Stand: Min assist Stand pivot transfers: Min assist       General transfer comment: minA to rise; pt fatigued easily after bath    Balance Overall balance assessment: Needs  assistance   Sitting balance-Leahy Scale: Good Sitting balance - Comments: no LOB   Standing balance support: Bilateral upper extremity supported Standing balance-Leahy Scale: Poor Standing balance comment: RW                           ADL either performed or assessed with clinical judgement   ADL Overall ADL's : Needs assistance/impaired                     Lower Body Dressing: Maximal assistance;Sitting/lateral leans Lower Body Dressing Details (indicate cue type and reason): Pt maxA for donning socks Toilet Transfer: Minimal assistance;Stand-pivot;BSC   Toileting- Clothing Manipulation and Hygiene: Set up;Sitting/lateral lean;Total assistance;Sit to/from stand;Cueing for safety Toileting - Clothing Manipulation Details (indicate cue type and reason): Pt able to perform with set-upA for anterior pericare; pt totalA for posterior pericare today.     Functional mobility during ADLs: Minimal assistance;Rolling walker General ADL Comments: Pt following simple commands and performing own toilet hygiene anteriorly. Pt tolerating session well although pt appeared irritated by everything.     Vision   Vision Assessment?: No apparent visual deficits   Perception     Praxis      Cognition Arousal/Alertness: Awake/alert Behavior During Therapy: Flat affect;Agitated Overall Cognitive Status: Impaired/Different from baseline Area of Impairment: Memory;Awareness                     Memory: Decreased short-term memory   Safety/Judgement: Decreased awareness of deficits;Decreased awareness of safety     General Comments: Pt following 1-2 step commands, but severely irritated easily.  Pt stating "where is my walker, how am I supposed to get up without it?" Pt had not asked for RW prior and pt had not sat at EOB yet to see if getting OOB was appropriate today. Pt appeared very disgruntled by every move and expressing distaste for current hospital stay.         Exercises     Shoulder Instructions       General Comments      Pertinent Vitals/ Pain       Pain Assessment: Faces Faces Pain Scale: Hurts little more Pain Location: abdomen/ buttocks Pain Descriptors / Indicators: Sore;Aching;Discomfort;Guarding Pain Intervention(s): Monitored during session  Home Living                                          Prior Functioning/Environment              Frequency  Min 2X/week        Progress Toward Goals  OT Goals(current goals can now be found in the care plan section)  Progress towards OT goals: Progressing toward goals  Acute Rehab OT Goals Patient Stated Goal: to go home OT Goal Formulation: With patient Time For Goal Achievement: 06/01/20 Potential to Achieve Goals: Good ADL Goals Pt Will Perform Grooming: with min guard assist;standing Pt Will Perform Upper Body Bathing: with set-up;sitting Pt Will Perform Lower Body Bathing: with min guard assist;sit to/from stand Pt Will Perform Upper Body Dressing: with set-up;sitting Pt Will Perform Lower Body Dressing: with min guard assist;sit to/from stand Pt Will Transfer to Toilet: with min guard assist;ambulating;bedside commode Pt Will Perform Toileting - Clothing Manipulation and hygiene: with min guard assist;sit to/from stand Additional ADL Goal #1: Pt will sustain attention to ADL/functional task >5 min with no more than min cues for redirection.  Plan Discharge plan remains appropriate    Co-evaluation                 AM-PAC OT "6 Clicks" Daily Activity     Outcome Measure   Help from another person eating meals?: None Help from another person taking care of personal grooming?: A Little Help from another person toileting, which includes using toliet, bedpan, or urinal?: A Lot Help from another person bathing (including washing, rinsing, drying)?: A Lot Help from another person to put on and taking off regular upper body clothing?: A  Little Help from another person to put on and taking off regular lower body clothing?: A Lot 6 Click Score: 16    End of Session Equipment Utilized During Treatment: Rolling walker  OT Visit Diagnosis: Other abnormalities of gait and mobility (R26.89);Muscle weakness (generalized) (M62.81);Other symptoms and signs involving cognitive function   Activity Tolerance Patient tolerated treatment well   Patient Left in bed;with call bell/phone within reach;with bed alarm set;with nursing/sitter in room   Nurse Communication Mobility status        Time: 5993-5701 OT Time Calculation (min): 19 min  Charges: OT General Charges $OT Visit: 1 Visit OT Treatments $Self Care/Home Management : 8-22 mins  Jefferey Pica, OTR/L Acute Rehabilitation Services Pager: [phone] Office: [phone]    Antwone Capozzoli C 05/28/2020, 5:30 PM

## 2020-05-28 NOTE — Consult Note (Signed)
WOC Nurse Consult Note: Patient receiving care in [facility] [phone]. Reason for Consult: bilateral groin VACs Wound type: surgical WOC to perform MWF starting 05/29/20 Val Riles, RN, MSN, CWOCN, CNS-BC, pager [phone]

## 2020-05-29 DIAGNOSIS — E7800 Pure hypercholesterolemia, unspecified: Secondary | ICD-10-CM | POA: Diagnosis not present

## 2020-05-29 DIAGNOSIS — F329 Major depressive disorder, single episode, unspecified: Secondary | ICD-10-CM | POA: Diagnosis not present

## 2020-05-29 DIAGNOSIS — I5032 Chronic diastolic (congestive) heart failure: Secondary | ICD-10-CM | POA: Diagnosis not present

## 2020-05-29 DIAGNOSIS — K559 Vascular disorder of intestine, unspecified: Secondary | ICD-10-CM | POA: Diagnosis not present

## 2020-05-29 LAB — BASIC METABOLIC PANEL
Anion gap: 7 (ref 5–15)
BUN: 6 mg/dL — ABNORMAL LOW (ref 8–23)
CO2: 26 mmol/L (ref 22–32)
Calcium: 7.7 mg/dL — ABNORMAL LOW (ref 8.9–10.3)
Chloride: 102 mmol/L (ref 98–111)
Creatinine, Ser: 0.73 mg/dL (ref 0.44–1.00)
GFR calc Af Amer: >60 mL/min (ref >60)
GFR calc non Af Amer: >60 mL/min (ref >60)
Glucose, Bld: 94 mg/dL (ref 70–99)
Potassium: 4.1 mmol/L (ref 3.5–5.1)
Sodium: 135 mmol/L (ref 135–145)

## 2020-05-29 LAB — CBC
HCT: 28.4 % — ABNORMAL LOW (ref 36.0–46.0)
Hemoglobin: 9.4 g/dL — ABNORMAL LOW (ref 12.0–15.0)
MCH: 30.9 pg (ref 26.0–34.0)
MCHC: 33.1 g/dL (ref 30.0–36.0)
MCV: 93.4 fL (ref 80.0–100.0)
Platelets: 251 10*3/uL (ref 150–400)
RBC: 3.04 MIL/uL — ABNORMAL LOW (ref 3.87–5.11)
RDW: 17.2 % — ABNORMAL HIGH (ref 11.5–15.5)
WBC: 6.3 10*3/uL (ref 4.0–10.5)
nRBC: 0 % (ref 0.0–0.2)

## 2020-05-29 LAB — PREALBUMIN: Prealbumin: 10.8 mg/dL — ABNORMAL LOW (ref 18–38)

## 2020-05-29 NOTE — Progress Notes (Addendum)
  Progress Note    05/29/2020 8:07 AM 2 Days Post-Op  Subjective:  No complaints.  Wants to go home.  Continues to have diarrhea   Vitals:   05/28/20 2016 05/29/20 0511  BP: (!) 145/62 (!) 155/59  Pulse: 69 66  Resp: 15 16  Temp: 98 F (36.7 C) 98 F (36.7 C)  SpO2: 96% 98%   Physical Exam Lungs:  Non labored Incisions:  Wound vac dressings with good seal; minimal output Extremities:  Feet symmetrically warm with good cap refill Neurologic: A&O  CBC    Component Value Date/Time   WBC 6.3 05/29/2020 0449   RBC 3.04 (L) 05/29/2020 0449   HGB 9.4 (L) 05/29/2020 0449   HCT 28.4 (L) 05/29/2020 0449   PLT 251 05/29/2020 0449   MCV 93.4 05/29/2020 0449   MCH 30.9 05/29/2020 0449   MCHC 33.1 05/29/2020 0449   RDW 17.2 (H) 05/29/2020 0449   LYMPHSABS 2.2 05/22/2020 0834   MONOABS 0.4 05/22/2020 0834   EOSABS 0.1 05/22/2020 0834   BASOSABS 0.0 05/22/2020 0834    BMET    Component Value Date/Time   NA 135 05/29/2020 0449   K 4.1 05/29/2020 0449   CL 102 05/29/2020 0449   CO2 26 05/29/2020 0449   GLUCOSE 94 05/29/2020 0449   BUN 6 (L) 05/29/2020 0449   CREATININE 0.73 05/29/2020 0449   CALCIUM 7.7 (L) 05/29/2020 0449   GFRNONAA >60 05/29/2020 0449   GFRAA >60 05/29/2020 0449    INR    Component Value Date/Time   INR 1.5 (H) 04/18/2020 1648     Intake/Output Summary (Last 24 hours) at 05/29/2020 0807 Last data filed at 05/29/2020 0522 Gross per 24 hour  Intake 470 ml  Output --  Net 470 ml     Assessment/Plan:  68 y.o. female is s/p ABF with subsequent bilateral groin debridement 2 Days Post-Op   Vac change per [facility] team Home vac form signed and on chart D/c pending medical problems and arrangement of HH for vac changes   Dagoberto Ligas, PA-C [facility] [phone] 05/29/2020 8:07 AM  Agree with above. VAC on groins.  Dr Donzetta Matters will return tomorrow to make decision on disposition.  Ruta Hinds, MD [facility] [place] Office: [phone]

## 2020-05-29 NOTE — Progress Notes (Signed)
Present at time of bilateral groin wound VAC changes with [place]. Bilateral groins with minimal dimensions of wounds- wounds with viable wound edges, pink granulation tissue in wound bed, some bleeding. Will see if application for home VAC approved. If not may be able to d/c home with [facility] order for hydrogel to bilateral groins. Continue wound VAC changes MWF  Paulo Fruit PA-C [facility] [phone] 05/29/2020

## 2020-05-29 NOTE — Care Management (Addendum)
Faxed  home VAC application to [employer], will update once  measurements received. [place] aware patient will discharge with bilateral groin VAC's. Will need HHRN order with instructions ( entered just need signature  )   Magdalen Spatz RN

## 2020-05-29 NOTE — Progress Notes (Signed)
The Gastrografin barium enema was reviewed and shows a long smooth stricture of the left colon as described on the report.  It is suspected that this is an ischemic stricture.  Her recent sigmoidoscopy that was done was unsuccessful in getting an endoscope beyond this point.  She is being treated for C. difficile.  I think a surgical consult would be appropriate at this point to see what they think and whether or not she is going to need resection of the stricture or continued observation.

## 2020-05-29 NOTE — Consult Note (Signed)
[place] Nurse Consult Note: Reason for Consult: first postop NPWT dressing; Wound type: Pressure Injury POA:NA Measurement: Wound bed: Drainage (amount, consistency, odor)  Periwound: Dressing procedure/placement/frequency: Changed dressing at the bedside with Corrina PA with vascular  Removed old NPWT dressing Right groin: 3cm x 3cm x 0.2cm  Left groin: 3cm x 2.5cm 0.2cm  Filled each wound with  _1__ piece of black foam (very small and making the foam thinner) Utilized 1/4 pieces of ostomy barrier in the left groin to aid in seal.  Sealed NPWT dressing at 175m HG Y connected to one unit  Patient received IV pain medication per bedside nurse prior to dressing change Patient tolerated procedure well; patient is a little angry with the entire hospitalization.  Plans for DC with y-connection to home unit; bedside nurse hook to home unit at the time for DC.   M[place]on CFronton [place]

## 2020-05-29 NOTE — Progress Notes (Signed)
PROGRESS NOTE    Tonya Hughes  DXI:[id number] DOB: [birth year] DOA: 05/17/2020 PCP: Monico Blitz, MD    Brief Narrative:  69 year old female with medical history of PAD s/p aortobifemoral bypass on 7/67/3419, diastolic heart failure admitted on 716 after presenting with abdominal pain and felt to have ischemic colitis.  Vascular surgery was consulted.  In the ER patient was noted to have positive C. difficile antigen but negative for toxin although she did have diarrhea.  However PCR was positive.  CT abdomen pelvis noted colitis of the descending colon and rectosigmoid with improvement of chronic changes of splenic flexure seen on prior CT.  She was started on IV antibiotics and oral vancomycin.  Also developed rectal bleeding with drop in hemoglobin requiring 2 units PRBC.  GI was consulted, underwent flexible sigmoidoscopy on 05/20/2020.  Sigmoidoscopy pathology consistent with ischemic colitis.  Pathology is currently pending.  Assessment & Plan:   Principal Problem:   Abdominal pain Active Problems:   Hypertension   Hypercholesteremia   Depressive disorder   Congestive heart failure (CHF) (HCC)   Peripheral arterial occlusive disease (HCC)   Hyponatremia   Diarrhea   C. difficile colitis   1. Findings concerning for ischemic colitis-seen on flexible sigmoidoscopy, patient and was initially started on Rocephin and Flagyl.  Patient continues to have diarrhea with abdominal pain.  Pathology from rectosigmoid biopsy showed chronic active nonspecific colitis with ulceration, negative for granulomata and dysplasia.  Findings are compatible with with chronic ischemic colitis. Pt is continued on IV flagyl. GI following and pt now s/p gastrografin study with finding of long smooth stricture of the L colon, suspect ischemic stricture. GI recommendation for surgical consult 2. C. difficile testing was positive for antigen but negative for toxin, PCR was positive.  Patient presented with  diarrhea.  She was started on oral vancomycin since 05/17/2020 for 10 days although pt is refusing PO vanc 3. Hypokalemia- normalized  4. Rectal bleeding/anemia-patient had rectal bleed starting 05/18/2020.  Hemoglobin dropped to 6.2 on 17/21, required 2 units PRBC.  Hemoglobin has currently stable at 9.4 5. Status post aortobifemoral bypass-vascular surgery following.  She has poor wound healing in both groins.  S/p b/l groin debridement in OR yesterday.Vascular surgery cont to follow 6. Acute kidney injury on CKD stage II-resolved, creatinine back to baseline, recheck bmet in AM. 7. Hypertension- bp stable controlled, continue lisinopril and Coreg . 8. Chronic diastolic CHF-patient has ongoing diarrhea,  Lasix on hold for now  DVT prophylaxis: SCD's Code Status: Full Family Communication: Pt in room, family not at bedside  Status is: Inpatient  Remains inpatient appropriate because:Ongoing diagnostic testing needed not appropriate for outpatient work up and Inpatient level of care appropriate due to severity of illness   Dispo: The patient is from: Home              Anticipated d/c is to: Home              Anticipated d/c date is: 2 days              Patient currently is not medically stable to d/c.   Consultants:   GI  Vascular Surgery  General Surgery  Procedures:   Flex sig 7/19  Antimicrobials: Anti-infectives (From admission, onward)   Start     Dose/Rate Route Frequency Ordered Stop   05/24/20 1630  vancomycin (VANCOCIN) 50 mg/mL oral solution 125 mg     Discontinue     125 mg Oral 4 times  daily 05/24/20 1535 06/03/20 1759   05/18/20 0830  metroNIDAZOLE (FLAGYL) IVPB 500 mg     Discontinue     500 mg 100 mL/hr over 60 Minutes Intravenous Every 8 hours 05/18/20 0802     05/17/20 2345  vancomycin (VANCOCIN) 50 mg/mL oral solution 125 mg  Status:  Discontinued        125 mg Oral 4 times daily 05/17/20 2326 05/24/20 1527   05/17/20 2345  cefTRIAXone (ROCEPHIN) 2 g in  sodium chloride 0.9 % 100 mL IVPB  Status:  Discontinued        2 g 200 mL/hr over 30 Minutes Intravenous Every 24 hours 05/17/20 2335 05/24/20 1226   05/17/20 2330  ciprofloxacin (CIPRO) IVPB 400 mg  Status:  Discontinued        400 mg 200 mL/hr over 60 Minutes Intravenous Every 12 hours 05/17/20 2301 05/17/20 2334   05/17/20 2300  metroNIDAZOLE (FLAGYL) IVPB 500 mg        500 mg 100 mL/hr over 60 Minutes Intravenous  Once 05/17/20 2251 05/18/20 0131       Subjective: Still having diarrhea, complaining of abd discomfort  Objective: Vitals:   05/28/20 0552 05/28/20 2016 05/29/20 0511 05/29/20 1449  BP: (!) 155/64 (!) 145/62 (!) 155/59 (!) 159/59  Pulse: 67 69 66 66  Resp: 18 15 16 16   Temp: 97.7 F (36.5 C) 98 F (36.7 C) 98 F (36.7 C) 97.6 F (36.4 C)  TempSrc: Oral Oral Oral Axillary  SpO2: 98% 96% 98% 98%  Weight:      Height:        Intake/Output Summary (Last 24 hours) at 05/29/2020 1500 Last data filed at 05/29/2020 1330 Gross per 24 hour  Intake 1759.21 ml  Output --  Net 1759.21 ml   Filed Weights   05/19/20 0423 05/27/20 1228  Weight: 73.4 kg 73.4 kg    Examination:  General exam: Appears calm and comfortable  Respiratory system: Clear to auscultation. Respiratory effort normal. Cardiovascular system: S1 & S2 heard, Regular Gastrointestinal system: Abdomen is nondistended, soft and nontender. No organomegaly or masses felt. Normal bowel sounds heard. Central nervous system: Alert and oriented. No focal neurological deficits. Extremities: Symmetric 5 x 5 power. Skin: No rashes, lesions Psychiatry: Judgement and insight appear normal. Mood & affect appropriate.   Data Reviewed: I have personally reviewed following labs and imaging studies  CBC: Recent Labs  Lab 05/24/20 0719 05/25/20 1836 05/27/20 0745 05/28/20 0212 05/29/20 0449  WBC 8.0 8.1 7.0 7.4 6.3  HGB 9.6* 9.9* 9.4* 9.3* 9.4*  HCT 30.2* 31.1* 29.7* 29.6* 28.4*  MCV 92.1 91.2 92.5 94.6  93.4  PLT 277 298 280 280 195   Basic Metabolic Panel: Recent Labs  Lab 05/24/20 0719 05/26/20 0632 05/27/20 0745 05/29/20 0449  NA 133* 135 135 135  K 3.2* 3.3* 3.5 4.1  CL 98 99 101 102  CO2 27 26 26 26   GLUCOSE 92 92 94 94  BUN 5* 6* 7* 6*  CREATININE 0.84 0.85 0.83 0.73  CALCIUM 7.7* 8.1* 7.8* 7.7*   GFR: Estimated Creatinine Clearance: 65.5 mL/min (by C-G formula based on SCr of 0.73 mg/dL). Liver Function Tests: No results for input(s): AST, ALT, ALKPHOS, BILITOT, PROT, ALBUMIN in the last 168 hours. No results for input(s): LIPASE, AMYLASE in the last 168 hours. No results for input(s): AMMONIA in the last 168 hours. Coagulation Profile: No results for input(s): INR, PROTIME in the last 168 hours. Cardiac Enzymes: No results  for input(s): CKTOTAL, CKMB, CKMBINDEX, TROPONINI in the last 168 hours. BNP (last 3 results) No results for input(s): PROBNP in the last 8760 hours. HbA1C: No results for input(s): HGBA1C in the last 72 hours. CBG: Recent Labs  Lab 05/22/20 1717 05/22/20 2038 05/23/20 0005 05/23/20 0803 05/27/20 1610  GLUCAP 97 100* 89 93 92   Lipid Profile: No results for input(s): CHOL, HDL, LDLCALC, TRIG, CHOLHDL, LDLDIRECT in the last 72 hours. Thyroid Function Tests: No results for input(s): TSH, T4TOTAL, FREET4, T3FREE, THYROIDAB in the last 72 hours. Anemia Panel: No results for input(s): VITAMINB12, FOLATE, FERRITIN, TIBC, IRON, RETICCTPCT in the last 72 hours. Sepsis Labs: No results for input(s): PROCALCITON, LATICACIDVEN in the last 168 hours.  Recent Results (from the past 240 hour(s))  Surgical pcr screen     Status: None   Collection Time: 05/27/20 12:37 AM   Specimen: Nasal Mucosa; Nasal Swab  Result Value Ref Range Status   MRSA, PCR NEGATIVE NEGATIVE Final   Staphylococcus aureus NEGATIVE NEGATIVE Final    Comment: (NOTE) The Xpert SA Assay (FDA approved for NASAL specimens in patients 51 years of age and older), is one  component of a comprehensive surveillance program. It is not intended to diagnose infection nor to guide or monitor treatment. Performed at [facility], [place] [address], [place], [place] [address]      Radiology Studies: DG BE (COLON)W SINGLE CM (SOL OR THIN BA)  Result Date: 05/28/2020 CLINICAL DATA:  Ischemic stricture left colon EXAM: WATER SOLUBLE CONTRAST ENEMA TECHNIQUE: Barium enema tip was placed in the patient's rectum. Water-soluble contrast material was refluxed in a retrograde fashion to fill the left colon. FLUOROSCOPY TIME:  Fluoroscopy Time:  0 minutes and 48 seconds. Radiation Exposure Index (if provided by the fluoroscopic device): 1,721 mGy Number of Acquired Spot Images: COMPARISON:  CTA 05/17/2020 FINDINGS: Pre-procedure KUB shows a nonspecific bowel gas pattern with extensive lumbar fusion hardware, incompletely visualized. Patient was placed left-side down decubitus on the fluoro table and monitored fluoroscopically while water-soluble contrast was introduced into the rectum. Images were obtained in the decubitus and supine positions. Contrast refluxed into a stenotic sigmoid colon and distal descending colon. Proximal and mid descending colon are nonstenotic. As the contrast refluxed into the region of the splenic flexure, it began flowing around the enema tip and the patient was unable to retain further contrast. No further filling could be obtained. IMPRESSION: Long segment left colonic smooth stricture extending from approximately the mid descending colon to the rectum. There is abrupt transition from normal to stenotic colon. Electronically Signed   By: Misty Stanley M.D.   On: 05/28/2020 14:26    Scheduled Meds: . atorvastatin  40 mg Oral QHS  . carvedilol  3.125 mg Oral BID  . lisinopril  5 mg Oral Daily  . loratadine  10 mg Oral Daily  . PARoxetine  20 mg Oral Daily  . sodium chloride flush  10-40 mL Intracatheter Q12H  . vancomycin  125 mg Oral QID    Continuous Infusions: . lactated ringers 10 mL/hr at 05/20/20 1152  . metronidazole 500 mg (05/29/20 0809)     LOS: 12 days   Marylu Lund, MD [facility] Pager On Amion  If 7PM-7AM, please contact night-coverage 05/29/2020, 3:00 PM

## 2020-05-29 NOTE — Consult Note (Signed)
[facility] Surgery Consult Note  Tonya Hughes [birth year]  [id number].    Requesting MD: Wyline Copas Chief Complaint/Reason for Consult: Sigmoid colon stricture  HPI:  Patient is a 69 year old female who is s/p aortobifemoral bypass 04/18/20. IMA was ligated and not reimplanted. Patient was having bloody diarrhea POD#1 and was scoped by GI, found to have severe ischemic colitis. CT scan 6/19 showed decreased enhancement of left colon down through the sigmoid colon to the rectum with some mild wall thickening. General surgery consulted. Patient improved with conservative management and did not require surgery. Patient was discharged to [facility] 04/29/20. presented to the ED from vascular clinic 05/17/20 with abdominal pain. She was also having diarrhea on presentation and found to be positive for C. Diff antigen, PCR was positive. CT 7/16 consistent with colitis of distal descending colon. GI consulted again and patient underwent flexible sigmoidoscopy 05/20/20, this showed some improvement in ischemic colitis with sigmoid stricture. GI obtained gastrografin enema 05/28/20 and noted long smooth stricture of left colon. GI has recommended surgical consult to consider surgical resection.   Patient also taken to OR with vascular surgery 05/27/20 for poor healing of bilateral groin wounds. PMH otherwise significant for PAD, chronic diastolic heart failure, CKD stage II, HTN, T2DM, HLD, Chronic back pain, Depression. Past abdominal surgery includes appendectomy, cholecystectomy, abdominal hysterectomy, in addition to vascular surgeries. Allergies listed to PCNs and zanaflex.   Today the patient denies abdominal pain. She reports poor oral intake over last night 30+ days due to pain/stomach upset with eating but says this has improved and now she can tolerate solid food with significant pain, nausea, or vomiting. She states that she is not eating much and she does not like the hospital food. She reports having some bowel  movements but is not having flatus. She is unsure if she has any solid stools because she does not see them. When I ask the patient what her goal is she says she wants to go home. She says she does not want any more surgeries  ROS: Review of Systems  Constitutional: Positive for malaise/fatigue. Negative for chills and fever.  Gastrointestinal: Positive for abdominal pain and diarrhea. Negative for constipation, nausea and vomiting.  All other systems reviewed and are negative.  Family History  Problem Relation Age of Onset  . Hypertension Father     Past Medical History:  Diagnosis Date  . Arthritis    HANDS  . Back pain   . Congestive heart failure (CHF) ([place])   . Depressive disorder   . Diabetes mellitus without complication ([place])    new, A1c 6.9% 04/2020  . DVT (deep venous thrombosis) ([place])    Right LE s/p back surgery stop date lovenox 09/05/17  . Dyspnea   . Gastritis   . GERD (gastroesophageal reflux disease)   . Hypercholesteremia   . Hypertension   . Pneumonia     Past Surgical History:  Procedure Laterality Date  . ABDOMINAL AORTOGRAM W/LOWER EXTREMITY Bilateral 11/13/2019   Procedure: ABDOMINAL AORTOGRAM W/LOWER EXTREMITY;  Surgeon: Waynetta Sandy, MD;  Location: [place] [facility];  Service: Cardiovascular;  Laterality: Bilateral;  . ABDOMINAL HYSTERECTOMY    . AORTA - BILATERAL FEMORAL ARTERY BYPASS GRAFT N/A 04/18/2020   Procedure: AORTA BIFEMORAL BYPASS;  Surgeon: Waynetta Sandy, MD;  Location: [place];  Service: Vascular;  Laterality: N/A;  . AORTIC ENDARTERECETOMY N/A 04/18/2020   Procedure: abdominal AORTIC ENDARTERECETOMY;  Surgeon: Waynetta Sandy, MD;  Location: [place];  Service: Vascular;  Laterality: N/A;  . APPENDECTOMY    . APPLICATION OF WOUND VAC Bilateral 05/27/2020   Procedure: APPLICATION OF WOUND VAC;  Surgeon: Elam Dutch, MD;  Location: [place];  Service: Vascular;  Laterality: Bilateral;  . BACK SURGERY    .  BIOPSY  05/20/2020   Procedure: BIOPSY;  Surgeon: Arta Silence, MD;  Location: [place];  Service: Endoscopy;;  . CERVICAL SPINE SURGERY    . CHOLECYSTECTOMY    . COLONOSCOPY N/A 04/19/2020   Procedure: COLONOSCOPY;  Surgeon: Ronald Lobo, MD;  Location[facility] ENDOSCOPY;  Service: Endoscopy;  Laterality: N/A;  bedside exam, pt on ventilator  . DIAGNOSTIC LAPAROSCOPY     lap. chole  . ENDARTERECTOMY FEMORAL Right 04/18/2020   Procedure: RIGHT FEMORAL ENDARTERECTOMY;  Surgeon: Waynetta Sandy, MD;  Location: [place];  Service: Vascular;  Laterality: Right;  . FLEXIBLE SIGMOIDOSCOPY N/A 05/20/2020   Procedure: FLEXIBLE SIGMOIDOSCOPY;  Surgeon: Arta Silence, MD;  Location: [facility] ENDOSCOPY;  Service: Endoscopy;  Laterality: N/A;  . LUMBAR SPINE SURGERY    . WOUND DEBRIDEMENT Bilateral 05/27/2020   Procedure: BILATERAL GROIN DEBRIDEMENT;  Surgeon: Elam Dutch, MD;  Location: [facility] OR;  Service: Vascular;  Laterality: Bilateral;    Social History:  reports that she has quit smoking. Her smoking use included cigarettes. She has a 40.00 pack-year smoking history. She has never used smokeless tobacco. She reports previous alcohol use. She reports that she does not use drugs.  Allergies:  Allergies  Allergen Reactions  . Zanaflex [Tizanidine Hcl] Hives and Swelling  . Penicillins Itching and Rash    Did it involve swelling of the face/tongue/throat, SOB, or low BP? No Did it involve sudden or severe rash/hives, skin peeling, or any reaction on the inside of your mouth or nose? Yes Did you need to seek medical attention at a hospital or doctor's office? Yes When did it last happen?40 + years ago If all above answers are "NO", may proceed with cephalosporin use.     Medications Prior to Admission  Medication Sig Dispense Refill  . aspirin EC 81 MG tablet Take 81 mg by mouth daily.    Marland Kitchen atorvastatin (LIPITOR) 40 MG tablet Take 1 tablet (40 mg total) by mouth daily. (Patient taking  differently: Take 40 mg by mouth at bedtime. ) 30 tablet 6  . carvedilol (COREG) 3.125 MG tablet TAKE ONE TABLET BY MOUTH TWICE DAILY (Patient taking differently: Take 3.125 mg by mouth 2 (two) times daily. ) 4 tablet 0  . fexofenadine (ALLEGRA) 180 MG tablet Take 180 mg by mouth daily.    . furosemide (LASIX) 40 MG tablet Take 40 mg by mouth daily.    Marland Kitchen ibuprofen (ADVIL) 200 MG tablet Take 600 mg by mouth every 6 (six) hours as needed for headache or mild pain.    Marland Kitchen lisinopril (PRINIVIL,ZESTRIL) 5 MG tablet Take 5 mg by mouth daily.    Marland Kitchen PARoxetine (PAXIL) 20 MG tablet Take 20 mg by mouth daily.    . potassium chloride SA (K-DUR,KLOR-CON) 20 MEQ tablet TAKE ONE (1) TABLET EACH DAY (Patient taking differently: Take 20 mEq by mouth daily. ) 7 tablet 0  . albuterol (PROVENTIL) (2.5 MG/3ML) 0.083% nebulizer solution Take 3 mLs (2.5 mg total) by nebulization 2 (two) times daily.    Marland Kitchen arformoterol (BROVANA) 15 MCG/2ML NEBU Take 2 mLs (15 mcg total) by nebulization 2 (two) times daily.      Blood pressure (!) 159/59, pulse 66, temperature 97.6  F (36.4 C), temperature source Axillary, resp. rate 16, height 5' 2.99" (1.6 m), weight 73.4 kg, SpO2 98 %. Physical Exam:   Patient refused physical exam  Results for orders placed or performed during the hospital encounter of 05/17/20 (from the past 48 hour(s))  Glucose, capillary     Status: None   Collection Time: 05/27/20  4:10 PM  Result Value Ref Range   Glucose-Capillary 92 70 - 99 mg/dL    Comment: Glucose reference range applies only to samples taken after fasting for at least 8 hours.  CBC     Status: Abnormal   Collection Time: 05/28/20  2:12 AM  Result Value Ref Range   WBC 7.4 4.0 - 10.5 K/uL   RBC 3.13 (L) 3.87 - 5.11 MIL/uL   Hemoglobin 9.3 (L) 12.0 - 15.0 g/dL   HCT 29.6 (L) 36 - 46 %   MCV 94.6 80.0 - 100.0 fL   MCH 29.7 26.0 - 34.0 pg   MCHC 31.4 30.0 - 36.0 g/dL   RDW 17.4 (H) 11.5 - 15.5 %   Platelets 280 150 - 400 K/uL    nRBC 0.0 0.0 - 0.2 %    Comment: Performed at [facility] [address], [place], [place] [address]  CBC     Status: Abnormal   Collection Time: 05/29/20  4:49 AM  Result Value Ref Range   WBC 6.3 4.0 - 10.5 K/uL   RBC 3.04 (L) 3.87 - 5.11 MIL/uL   Hemoglobin 9.4 (L) 12.0 - 15.0 g/dL   HCT 28.4 (L) 36 - 46 %   MCV 93.4 80.0 - 100.0 fL   MCH 30.9 26.0 - 34.0 pg   MCHC 33.1 30.0 - 36.0 g/dL   RDW 17.2 (H) 11.5 - 15.5 %   Platelets 251 150 - 400 K/uL   nRBC 0.0 0.0 - 0.2 %    Comment: Performed at [facility], [place] [address], [place], [place] [address]  Basic metabolic panel     Status: Abnormal   Collection Time: 05/29/20  4:49 AM  Result Value Ref Range   Sodium 135 135 - 145 mmol/L   Potassium 4.1 3.5 - 5.1 mmol/L    Comment: SPECIMEN HEMOLYZED. HEMOLYSIS MAY AFFECT INTEGRITY OF RESULTS.   Chloride 102 98 - 111 mmol/L   CO2 26 22 - 32 mmol/L   Glucose, Bld 94 70 - 99 mg/dL    Comment: Glucose reference range applies only to samples taken after fasting for at least 8 hours.   BUN 6 (L) 8 - 23 mg/dL   Creatinine, Ser 0.73 0.44 - 1.00 mg/dL   Calcium 7.7 (L) 8.9 - 10.3 mg/dL   GFR calc non Af Amer >60 >60 mL/min   GFR calc Af Amer >60 >60 mL/min   Anion gap 7 5 - 15    Comment: Performed at [facility] [address], [place], [place] [address]   DG BE (COLON)W SINGLE CM (SOL OR THIN BA)  Result Date: 05/28/2020 CLINICAL DATA:  Ischemic stricture left colon EXAM: WATER SOLUBLE CONTRAST ENEMA TECHNIQUE: Barium enema tip was placed in the patient's rectum. Water-soluble contrast material was refluxed in a retrograde fashion to fill the left colon. FLUOROSCOPY TIME:  Fluoroscopy Time:  0 minutes and 48 seconds. Radiation Exposure Index (if provided by the fluoroscopic device): 1,721 mGy Number of Acquired Spot Images: COMPARISON:  CTA 05/17/2020 FINDINGS: Pre-procedure KUB shows a nonspecific bowel gas pattern with extensive lumbar  fusion hardware, incompletely  visualized. Patient was placed left-side down decubitus on the fluoro table and monitored fluoroscopically while water-soluble contrast was introduced into the rectum. Images were obtained in the decubitus and supine positions. Contrast refluxed into a stenotic sigmoid colon and distal descending colon. Proximal and mid descending colon are nonstenotic. As the contrast refluxed into the region of the splenic flexure, it began flowing around the enema tip and the patient was unable to retain further contrast. No further filling could be obtained. IMPRESSION: Long segment left colonic smooth stricture extending from approximately the mid descending colon to the rectum. There is abrupt transition from normal to stenotic colon. Electronically Signed   By: Misty Stanley M.D.   On: 05/28/2020 14:26      Assessment/Plan PAD chronic diastolic heart failure CKD stage II HTN T2DM HLD Chronic back pain Depression  Ischemic Colitis  Sigmoid colonic stricture causing partial large bowel obstruction  We discussed the stricture/narrowing of her colon and that it will likely not improve over time, meaning she will live with a chronic partial large bowel obstruction that could worsen over time. The patient is currently refusing any surgical intervention for this problem. I will add on some nutritional labs today and follow up with the patient tomorrow.   Obie Dredge, [facility] Surgery 05/29/2020, 3:29 PM Please see Amion for pager number during day hours 7:00am-4:30pm

## 2020-05-29 NOTE — Progress Notes (Signed)
Physical Therapy Treatment Patient Details Name: Tonya Hughes MRN: [id number] DOB: 03-13-1951 Today's Date: 05/29/2020    History of Present Illness Pt is a 69 year old woman admitted one day after d/c from SNF with abdominal pain and diarrhea, hyponatremia and AKI. Possible ischemic colitis. Pt discharged from [facility] on 04/29/20 after admission for aortobifemoral BPG with complication of sigmoid ischemia. PMH: CHF, COPD, MDD, HTN, HLD.      PT Comments    Pt performing at a similar mobility level. Requiring up to min assist for transfers, ambulating 30 feet with a walker at a min guard assist level. Demonstrates balance deficits, weakness, pain, and gait abnormalities. Will benefit from HHPT follow up.     Follow Up Recommendations  Home health PT;Supervision/Assistance - 24 hour     Equipment Recommendations  None recommended by PT    Recommendations for Other Services       Precautions / Restrictions Precautions Precautions: Fall Precaution Comments: pressure wound on left heel, c-diff diarrhea, Bilat wounds to groin with wound vac Restrictions Weight Bearing Restrictions: No    Mobility  Bed Mobility Overal bed mobility: Modified Independent                Transfers Overall transfer level: Needs assistance Equipment used: Rolling walker (2 wheeled) Transfers: Sit to/from Stand Sit to Stand: Min assist;Min guard         General transfer comment: MinA to rise from edge of bed, increased time/effort. Min guard to rise from toilet and recliner  Ambulation/Gait Ambulation/Gait assistance: Min guard Gait Distance (Feet): 30 Feet Assistive device: Rolling walker (2 wheeled) Gait Pattern/deviations: Step-through pattern;Decreased stride length;Trunk flexed;Steppage Gait velocity: decreased   General Gait Details: Pt with mild instability, requiring min guard assist for safety. Pt reaching hand from walker to toilet grab bar prior to turning; advised against this and  pt shouted, "I feel more stable this way!" Increased L plantaflexion in stance phase, increased trunk flexion   Stairs             Wheelchair Mobility    Modified Rankin (Stroke Patients Only)       Balance Overall balance assessment: Needs assistance   Sitting balance-Leahy Scale: Good Sitting balance - Comments: no LOB   Standing balance support: Bilateral upper extremity supported Standing balance-Leahy Scale: Poor Standing balance comment: RW                            Cognition Arousal/Alertness: Awake/alert Behavior During Therapy: Flat affect;Agitated Overall Cognitive Status: Impaired/Different from baseline Area of Impairment: Memory;Awareness                     Memory: Decreased short-term memory   Safety/Judgement: Decreased awareness of deficits;Decreased awareness of safety     General Comments: Pt becomes irritated/upset very easily      Exercises      General Comments        Pertinent Vitals/Pain Pain Assessment: Faces Faces Pain Scale: Hurts little more Pain Location: back Pain Descriptors / Indicators: Sore Pain Intervention(s): Monitored during session    Home Living                      Prior Function            PT Goals (current goals can now be found in the care plan section) Acute Rehab PT Goals Patient Stated Goal: to go home  Potential to Achieve Goals: Fair Progress towards PT goals: Progressing toward goals    Frequency    Min 3X/week      PT Plan Current plan remains appropriate    Co-evaluation              AM-PAC PT "6 Clicks" Mobility   Outcome Measure  Help needed turning from your back to your side while in a flat bed without using bedrails?: None Help needed moving from lying on your back to sitting on the side of a flat bed without using bedrails?: None Help needed moving to and from a bed to a chair (including a wheelchair)?: A Little Help needed standing up  from a chair using your arms (e.g., wheelchair or bedside chair)?: A Little Help needed to walk in hospital room?: A Little Help needed climbing 3-5 steps with a railing? : A Lot 6 Click Score: 19    End of Session   Activity Tolerance: Patient tolerated treatment well Patient left: in bed;with call bell/phone within reach Nurse Communication: Mobility status PT Visit Diagnosis: Other abnormalities of gait and mobility (R26.89);Difficulty in walking, not elsewhere classified (R26.2);Muscle weakness (generalized) (M62.81);Other symptoms and signs involving the nervous system (R29.898);Pain Pain - Right/Left: Left Pain - part of body: Leg     Time: 5830-9407 PT Time Calculation (min) (ACUTE ONLY): 26 min  Charges:  $Therapeutic Activity: 23-37 mins                       Wyona Almas, PT, DPT Acute Rehabilitation Services Pager [phone] Office [phone]    Deno Etienne 05/29/2020, 3:50 PM

## 2020-05-30 DIAGNOSIS — F329 Major depressive disorder, single episode, unspecified: Secondary | ICD-10-CM | POA: Diagnosis not present

## 2020-05-30 DIAGNOSIS — A0472 Enterocolitis due to Clostridium difficile, not specified as recurrent: Secondary | ICD-10-CM | POA: Diagnosis not present

## 2020-05-30 MED ORDER — METRONIDAZOLE 500 MG PO TABS
500.0000 mg | ORAL_TABLET | Freq: Three times a day (TID) | ORAL | 0 refills | Status: DC
Start: 2020-05-30 — End: 2020-06-14

## 2020-05-30 MED FILL — metroNIDAZOLE 500 MG TABS: 500 | 5 days supply | Qty: 15 | Fill #0

## 2020-05-30 NOTE — Progress Notes (Signed)
Appreciate surgery seeing the patient.  Their notes have been reviewed.  From a GI standpoint we have nothing more to offer.  We will sign off.

## 2020-05-30 NOTE — Progress Notes (Addendum)
  Progress Note    05/30/2020 7:39 AM 3 Days Post-Op  Subjective:  Wants to go home   Vitals:   05/29/20 2042 05/30/20 0657  BP: (!) 149/49 (!) 180/59  Pulse: 64 65  Resp: 16 16  Temp: 98.1 F (36.7 C) 98.3 F (36.8 C)  SpO2: 96% 98%   Physical Exam: Lungs:  Non labored Incisions:  Groin vacs with good seal; midline incision healed Extremities:  Feet warm with sensory and motor intact Abdomen:  soft Neurologic: A&O  CBC    Component Value Date/Time   WBC 6.3 05/29/2020 0449   RBC 3.04 (L) 05/29/2020 0449   HGB 9.4 (L) 05/29/2020 0449   HCT 28.4 (L) 05/29/2020 0449   PLT 251 05/29/2020 0449   MCV 93.4 05/29/2020 0449   MCH 30.9 05/29/2020 0449   MCHC 33.1 05/29/2020 0449   RDW 17.2 (H) 05/29/2020 0449   LYMPHSABS 2.2 05/22/2020 0834   MONOABS 0.4 05/22/2020 0834   EOSABS 0.1 05/22/2020 0834   BASOSABS 0.0 05/22/2020 0834    BMET    Component Value Date/Time   NA 135 05/29/2020 0449   K 4.1 05/29/2020 0449   CL 102 05/29/2020 0449   CO2 26 05/29/2020 0449   GLUCOSE 94 05/29/2020 0449   BUN 6 (L) 05/29/2020 0449   CREATININE 0.73 05/29/2020 0449   CALCIUM 7.7 (L) 05/29/2020 0449   GFRNONAA >60 05/29/2020 0449   GFRAA >60 05/29/2020 0449    INR    Component Value Date/Time   INR 1.5 (H) 04/18/2020 1648     Intake/Output Summary (Last 24 hours) at 05/30/2020 0739 Last data filed at 05/30/2020 3833 Gross per 24 hour  Intake 2274.27 ml  Output --  Net 2274.27 ml     Assessment/Plan:  69 y.o. female is s/p ABF with subsequent bilateral groin debridement 3 Days Post-Op   BLE warm and well perfused VAC change MWF with WOC RN TOC team arranging [facility] for vac changes Patient now be evaluated by Gen Surgery for partial large bowel obstruction    Dagoberto Ligas, PA-C [facility] [phone] 05/30/2020 7:39 AM  I have independently interviewed and examined patient and agree with PA assessment and plan above.  She does have  abdominal tenderness but is refusing any further surgery.  Her feet are warm and well-perfused and wound vacs are intact in her bilateral groins.  She is okay for discharge and I will have her follow-up in a few weeks for wound checks.  Ludwin Flahive C. Donzetta Matters, MD [facility] [place] Office: [phone] Pager: [phone]

## 2020-05-30 NOTE — Progress Notes (Signed)
I went by to discuss possibility of surgical intervention for sigmoid stricture with patient again this morning. She very adamantly states she is not interested in having surgery. She reports she is having bowel function and although she is still having some issues with eating she feels like it is improving. She declines examination at this time. I informed her that if she reconsiders or feels like condition is worsening and would like to speak to [place] again regarding resection of colonic stricture that we would be happy to readdress. Patient agrees to notify staff/providers if she changes her mind. We will sign off at this time, please call if we can be of further assistance.   Norm Parcel , [facility] 05/30/2020, 9:49 AM Please see Amion for pager number during day hours 7:00am-4:30pm

## 2020-05-30 NOTE — Progress Notes (Signed)
[name] to be D/C'd per MD order. Discussed with the patient and all questions fully answered. ? VSS, Skin clean, dry and intact without evidence of skin break down, no evidence of skin tears noted. ? IV catheter discontinued intact. Site without signs and symptoms of complications. Dressing and pressure applied. ? An After Visit Summary was printed and given to the patient. Patient informed where to pickup prescriptions. ? D/c education completed with patient/family including follow up instructions, medication list, d/c activities limitations if indicated, with other d/c instructions as indicated by MD - patient able to verbalize understanding, all questions fully answered.  ? Patient instructed to return to ED, call 911, or call MD for any changes in condition.  ? Patient to be escorted via stretcher, and D/C home via [facility].

## 2020-05-30 NOTE — Discharge Summary (Signed)
Physician Discharge Summary  Tonya Hughes NOI:[id number] DOB: [birth year] DOA: 05/17/2020  PCP: Monico Blitz, MD  Admit date: 05/17/2020 Discharge date: 05/30/2020  Admitted From: Home Disposition:  Home  Recommendations for Outpatient Follow-up:  1. Follow up with PCP in 1-2 weeks 2. Follow up with Vascular surgery as scheduled     Discharge Condition:Stable CODE STATUS:Ful Diet recommendation: Soft   Brief/Interim Summary: 69 year old female with medical history of PAD s/p aortobifemoral bypass on 6/94/5038, diastolic heart failure admitted on 716 after presenting with abdominal pain and felt to have ischemic colitis. Vascular surgery was consulted. In the ER patient was noted to have positive C. difficile antigen but negative for toxin although she did have diarrhea. However PCR was positive. CT abdomen pelvis noted colitis of the descending colon and rectosigmoid with improvement of chronic changes of splenic flexure seen on prior CT. She was started on IV antibiotics and oral vancomycin. Also developed rectal bleeding with drop in hemoglobin requiring 2 units PRBC. GI was consulted, underwent flexible sigmoidoscopy on 05/20/2020. Sigmoidoscopy pathology consistent with ischemic colitis. Pathology is currently pending.  Discharge Diagnoses:  Principal Problem:   Abdominal pain Active Problems:   Hypertension   Hypercholesteremia   Depressive disorder   Congestive heart failure (CHF) (HCC)   Peripheral arterial occlusive disease (HCC)   Hyponatremia   Diarrhea   C. difficile colitis  1. Findings concerning for ischemic colitis-seen on flexible sigmoidoscopy, patient and was initially started on Rocephin and Flagyl. Patient continues to have diarrhea with abdominal pain. Pathology from rectosigmoid biopsy showed chronic active nonspecific colitis with ulceration, negative for granulomata and dysplasia. Findings are compatible with with chronic ischemic colitis. Pt is  continued on IV flagyl. GI following and pt now s/p gastrografin study with finding of long smooth stricture of the L colon, suspect ischemic stricture. GI recommendation for surgical consult. Consulted general surgery. Pt adamantly refused any surgical intervention and was even noted to have refused physical exam by surgeon. General surgery and GI have since signed off 2. C. difficile testing was positive for antigen but negative for toxin, PCR was positive. Patient presented with diarrhea. She was started on oral vancomycin since 05/17/2020 for 10 days although pt had consistently refused PO vanc. Pt is agreeable, however for PO flagyl. Will complete 5 more day course on d/c 3. Hypokalemia-normalized 4. Rectal bleeding/anemia-patient had rectal bleed starting 05/18/2020. Hemoglobin dropped to 6.2 on 17/21, required 2 units PRBC. Hemoglobin has currently stable at 9.4 5. Status post aortobifemoral bypass-vascular surgery following. She has poor wound healing in both groins. S/p b/l groin debridement in OR. Vascular surgeryhad been following 6. Acute kidney injury on CKD stage II-resolved, creatinine back to baseline, recheck bmet in AM. 7. Hypertension-bp stable controlled, continuelisinopril and Coreg . 8. Chronic diastolic CHF-patient has ongoing diarrhea, Lasix was on hold   Discharge Instructions   Allergies as of 05/30/2020      Reactions   Zanaflex [tizanidine Hcl] Hives, Swelling   Penicillins Itching, Rash   Did it involve swelling of the face/tongue/throat, SOB, or low BP? No Did it involve sudden or severe rash/hives, skin peeling, or any reaction on the inside of your mouth or nose? Yes Did you need to seek medical attention at a hospital or doctor's office? Yes When did it last happen?40 + years ago If all above answers are "NO", may proceed with cephalosporin use.      Medication List    STOP taking these medications   furosemide 40 MG  tablet Commonly known as:  LASIX     TAKE these medications   albuterol (2.5 MG/3ML) 0.083% nebulizer solution Commonly known as: PROVENTIL Take 3 mLs (2.5 mg total) by nebulization 2 (two) times daily.   arformoterol 15 MCG/2ML Nebu Commonly known as: BROVANA Take 2 mLs (15 mcg total) by nebulization 2 (two) times daily.   aspirin EC 81 MG tablet Take 81 mg by mouth daily.   atorvastatin 40 MG tablet Commonly known as: LIPITOR Take 1 tablet (40 mg total) by mouth daily. What changed: when to take this   carvedilol 3.125 MG tablet Commonly known as: COREG TAKE ONE TABLET BY MOUTH TWICE DAILY   fexofenadine 180 MG tablet Commonly known as: ALLEGRA Take 180 mg by mouth daily.   ibuprofen 200 MG tablet Commonly known as: ADVIL Take 600 mg by mouth every 6 (six) hours as needed for headache or mild pain.   lisinopril 5 MG tablet Commonly known as: ZESTRIL Take 5 mg by mouth daily.   metroNIDAZOLE 500 MG tablet Commonly known as: Flagyl Take 1 tablet (500 mg total) by mouth 3 (three) times daily for 5 days.   PARoxetine 20 MG tablet Commonly known as: PAXIL Take 20 mg by mouth daily.   potassium chloride SA 20 MEQ tablet Commonly known as: KLOR-CON TAKE ONE (1) TABLET EACH DAY What changed: See the new instructions.       Follow-up Information    [facility]   Specialty: Home Health Services Why: [phone]       Monico Blitz, MD. Schedule an appointment as soon as possible for a visit in 2 week(s).   Specialty: Internal Medicine Contact information: [address] [place] [address] [phone]              Allergies  Allergen Reactions  . Zanaflex [Tizanidine Hcl] Hives and Swelling  . Penicillins Itching and Rash    Did it involve swelling of the face/tongue/throat, SOB, or low BP? No Did it involve sudden or severe rash/hives, skin peeling, or any reaction on the inside of your mouth or nose? Yes Did you need to seek medical attention at  a hospital or doctor's office? Yes When did it last happen?40 + years ago If all above answers are "NO", may proceed with cephalosporin use.     Consultations:  Vascular Surgery  General Surgery  GI  Procedures/Studies: DG BE (COLON)W SINGLE CM (SOL OR THIN BA)  Result Date: 05/28/2020 CLINICAL DATA:  Ischemic stricture left colon EXAM: WATER SOLUBLE CONTRAST ENEMA TECHNIQUE: Barium enema tip was placed in the patient's rectum. Water-soluble contrast material was refluxed in a retrograde fashion to fill the left colon. FLUOROSCOPY TIME:  Fluoroscopy Time:  0 minutes and 48 seconds. Radiation Exposure Index (if provided by the fluoroscopic device): 1,721 mGy Number of Acquired Spot Images: COMPARISON:  CTA 05/17/2020 FINDINGS: Pre-procedure KUB shows a nonspecific bowel gas pattern with extensive lumbar fusion hardware, incompletely visualized. Patient was placed left-side down decubitus on the fluoro table and monitored fluoroscopically while water-soluble contrast was introduced into the rectum. Images were obtained in the decubitus and supine positions. Contrast refluxed into a stenotic sigmoid colon and distal descending colon. Proximal and mid descending colon are nonstenotic. As the contrast refluxed into the region of the splenic flexure, it began flowing around the enema tip and the patient was unable to retain further contrast. No further filling could be obtained. IMPRESSION: Long segment left colonic smooth stricture  extending from approximately the mid descending colon to the rectum. There is abrupt transition from normal to stenotic colon. Electronically Signed   By: Misty Stanley M.D.   On: 05/28/2020 14:26   CT Angio Abd/Pel W and/or Wo Contrast  Result Date: 05/17/2020 CLINICAL DATA:  69 year old female with generalized abdominal pain and decreased p.o. intake. Concern for ischemia. EXAM: CTA ABDOMEN AND PELVIS WITHOUT AND WITH CONTRAST TECHNIQUE: Multidetector CT imaging of the  abdomen and pelvis was performed using the standard protocol during bolus administration of intravenous contrast. Multiplanar reconstructed images and MIPs were obtained and reviewed to evaluate the vascular anatomy. CONTRAST:  117m OMNIPAQUE IOHEXOL 350 MG/ML SOLN COMPARISON:  CT abdomen pelvis dated 04/20/2020. FINDINGS: VASCULAR Aorta: There is advanced atherosclerotic calcification. No aneurysmal dilatation or dissection. There is complete occlusion of the distal abdominal aorta. Postsurgical changes of an aortobifemoral bypass graft. Slight irregularity of the aortic and stem oasis, likely postsurgical. The bypass graft is patent to the level of the common femoral artery. There is stranding and fluid surrounding the aortic and stem oasis, likely postsurgical. No drainable fluid collection or abscess identified at this location. Celiac: There is atherosclerotic calcification of the origin of the celiac axis. The celiac artery and its major branches remain patent. SMA: Atherosclerotic calcification of the SMA. The SMA remains patent. Renals: The renal arteries are patent. 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 IMA: There is nonvisualization of the origin of the IMA possibly due to compression by periaortic fluid and edema. There is reconstitution of flow within the IMA. Inflow: There is advanced atherosclerotic calcification of the iliac arteries. There is high-grade occlusion of the common iliac arteries bilaterally with very diminished flow in the native external and internal iliac arteries. The iliac limbs of the bypass graft are patent. Proximal Outflow: There is high-grade narrowing of the distal anastomosis of the left iliac limb of the bypass graft at the anastomosis with the common femoral artery. There is advanced atherosclerotic calcification of the proximal portion of the common femoral, superficial and deep femoral arteries. These arteries appear narrowed with diminished flow but remain  patent. There is atherosclerotic calcification and luminal narrowing of the right common femoral artery and visualized proximal superficial and deep femoral arteries. These arteries however remain patent. Veins: The IVC is unremarkable. The SMV, splenic vein, and main portal vein are patent. No portal venous gas. Review of the MIP images confirms the above findings. NON-VASCULAR Lower chest: The visualized lung bases are clear. There is multi vessel coronary vascular calcification. No intra-abdominal free air or free fluid. Hepatobiliary: The liver is unremarkable. No intrahepatic biliary ductal dilatation. Cholecystectomy. No retained calcified stone noted in the central CBD. Pancreas: Unremarkable. No pancreatic ductal dilatation or surrounding inflammatory changes. Spleen: Normal in size without focal abnormality. Adrenals/Urinary Tract: The adrenal glands are unremarkable. There is mild right hydronephrosis similar to prior CT. There is no hydronephrosis on the left. Evaluation of the ureters is limited due to streak artifact caused by spinal hardware. The urinary bladder is unremarkable. Stomach/Bowel: There is diffuse circumferential thickening of the distal descending colon and rectosigmoid consistent with colitis. Interval improvement in the inflammatory changes of the splenic flexure since the prior CT. No pneumatosis. No definite evidence of perforation. No bowel obstruction. There is a 5 cm outpouching of the proximal stomach, likely a gastric diverticula. Lymphatic: No adenopathy. Reproductive: Hysterectomy. Other: Somewhat loculated fluid collections in the groins bilaterally adjacent to the distal bypass graft anastomosis most consistent with  seroma. Developing infection is not excluded. These fluid collections have increased in size since the prior CT and measure approximately 5.5 x 1.5 cm on the left and 4.0 x 2.2 cm on the right. Musculoskeletal: Osteopenia with degenerative changes of the spine and  severe postsurgical changes and posterior fusion. No acute osseous pathology. IMPRESSION: 1. Colitis of the distal descending colon and rectosigmoid with improved inflammatory changes of the splenic flexure seen on the prior CT. No pneumatosis. No free air or portal venous gas. 2. Occluded distal native aorta and an aorto bi femoral bypass graft. The graft is patent. 3. Atherosclerotic calcification and narrowing of the bilateral femoral arteries. 4. Loculated appearing fluid collections adjacent to the femoral and sternum oasis of the bypass graft, increased in size since the prior CT. These may represent seroma although an infectious process is not excluded. 5. Mild right hydronephrosis similar to prior CT. 6. A 5 cm gastric diverticula. 7. Aortic Atherosclerosis (ICD10-I70.0). Electronically Signed   By: Anner Crete M.D.   On: 05/17/2020 21:15    Subjective: Very eager to go home  Discharge Exam: Vitals:   05/29/20 2042 05/30/20 0657  BP: (!) 149/49 (!) 180/59  Pulse: 64 65  Resp: 16 16  Temp: 98.1 F (36.7 C) 98.3 F (36.8 C)  SpO2: 96% 98%   Vitals:   05/29/20 0511 05/29/20 1449 05/29/20 2042 05/30/20 0657  BP: (!) 155/59 (!) 159/59 (!) 149/49 (!) 180/59  Pulse: 66 66 64 65  Resp: 16 16 16 16   Temp: 98 F (36.7 C) 97.6 F (36.4 C) 98.1 F (36.7 C) 98.3 F (36.8 C)  TempSrc: Oral Axillary Oral Oral  SpO2: 98% 98% 96% 98%  Weight:      Height:        General: Pt is alert, awake, not in acute distress Cardiovascular: RRR, S1/S2 +, no rubs, no gallops Respiratory: CTA bilaterally, no wheezing, no rhonchi Abdominal: Soft, NT, ND, bowel sounds + Extremities: no edema, no cyanosis   The results of significant diagnostics from this hospitalization (including imaging, microbiology, ancillary and laboratory) are listed below for reference.     Microbiology: Recent Results (from the past 240 hour(s))  Surgical pcr screen     Status: None   Collection Time: 05/27/20 12:37  AM   Specimen: Nasal Mucosa; Nasal Swab  Result Value Ref Range Status   MRSA, PCR NEGATIVE NEGATIVE Final   Staphylococcus aureus NEGATIVE NEGATIVE Final    Comment: (NOTE) The Xpert SA Assay (FDA approved for NASAL specimens in patients 38 years of age and older), is one component of a comprehensive surveillance program. It is not intended to diagnose infection nor to guide or monitor treatment. Performed at [facility], [place] [address], [place], [place] [address]      Labs: BNP (last 3 results) No results for input(s): BNP in the last 8760 hours. Basic Metabolic Panel: Recent Labs  Lab 05/24/20 0719 05/26/20 0632 05/27/20 0745 05/29/20 0449  NA 133* 135 135 135  K 3.2* 3.3* 3.5 4.1  CL 98 99 101 102  CO2 27 26 26 26   GLUCOSE 92 92 94 94  BUN 5* 6* 7* 6*  CREATININE 0.84 0.85 0.83 0.73  CALCIUM 7.7* 8.1* 7.8* 7.7*   Liver Function Tests: No results for input(s): AST, ALT, ALKPHOS, BILITOT, PROT, ALBUMIN in the last 168 hours. No results for input(s): LIPASE, AMYLASE in the last 168 hours. No results for input(s): AMMONIA in the last 168 hours.  CBC: Recent Labs  Lab 05/24/20 0719 05/25/20 1836 05/27/20 0745 05/28/20 0212 05/29/20 0449  WBC 8.0 8.1 7.0 7.4 6.3  HGB 9.6* 9.9* 9.4* 9.3* 9.4*  HCT 30.2* 31.1* 29.7* 29.6* 28.4*  MCV 92.1 91.2 92.5 94.6 93.4  PLT 277 298 280 280 251   Cardiac Enzymes: No results for input(s): CKTOTAL, CKMB, CKMBINDEX, TROPONINI in the last 168 hours. BNP: Invalid input(s): POCBNP CBG: Recent Labs  Lab 05/27/20 1610  GLUCAP 92   D-Dimer No results for input(s): DDIMER in the last 72 hours. Hgb A1c No results for input(s): HGBA1C in the last 72 hours. Lipid Profile No results for input(s): CHOL, HDL, LDLCALC, TRIG, CHOLHDL, LDLDIRECT in the last 72 hours. Thyroid function studies No results for input(s): TSH, T4TOTAL, T3FREE, THYROIDAB in the last 72 hours.  Invalid input(s): FREET3 Anemia work up No results for  input(s): VITAMINB12, FOLATE, FERRITIN, TIBC, IRON, RETICCTPCT in the last 72 hours. Urinalysis    Component Value Date/Time   COLORURINE YELLOW 04/15/2020 1505   APPEARANCEUR CLEAR 04/15/2020 1505   LABSPEC 1.011 04/15/2020 1505   PHURINE 6.0 04/15/2020 1505   GLUCOSEU NEGATIVE 04/15/2020 1505   HGBUR SMALL (A) 04/15/2020 1505   BILIRUBINUR NEGATIVE 04/15/2020 1505   KETONESUR NEGATIVE 04/15/2020 1505   PROTEINUR NEGATIVE 04/15/2020 1505   NITRITE NEGATIVE 04/15/2020 1505   LEUKOCYTESUR NEGATIVE 04/15/2020 1505   Sepsis Labs Invalid input(s): PROCALCITONIN,  WBC,  LACTICIDVEN Microbiology Recent Results (from the past 240 hour(s))  Surgical pcr screen     Status: None   Collection Time: 05/27/20 12:37 AM   Specimen: Nasal Mucosa; Nasal Swab  Result Value Ref Range Status   MRSA, PCR NEGATIVE NEGATIVE Final   Staphylococcus aureus NEGATIVE NEGATIVE Final    Comment: (NOTE) The Xpert SA Assay (FDA approved for NASAL specimens in patients 48 years of age and older), is one component of a comprehensive surveillance program. It is not intended to diagnose infection nor to guide or monitor treatment. Performed at [facility], [place] [address], [place], [place] [address]    Time spent: 30 min  SIGNED:   Marylu Lund, MD  [facility] 05/30/2020, 4:38 PM  If 7PM-7AM, please contact night-coverage

## 2020-05-30 NOTE — Care Management (Signed)
Home [employer] , VAC has been approved and will be delivered to patient's hospital room today.   Magdalen Spatz RN

## 2020-05-30 NOTE — Progress Notes (Signed)
Left a voice message for patient's spouse regarding patient's equipment that could not be transported via [facility]. Instructed him that equipment would be here on 6N and to call the unit should he have any questions.

## 2020-05-30 NOTE — TOC Progression Note (Signed)
Transition of Care [facility]) - Progression Note    Patient Details  Name: CRISSA SOWDER MRN: [id number] Date of Birth: 10/24/1951  Transition of [facility]) CM/SW Contact  Jacalyn Lefevre Edson Snowball, RN Phone Number: 05/30/2020, 1:23 PM  Clinical Narrative:     Home [employer] VAC at bedside.   Patient requesting PTAR transport home. Patient states her husband is aware she is coming home and will have his sister go to address.   Patient's address is [place], [place], [place] [address]   Will place PTAR paperwork in shadow chart and ask nurse what time to arrange PTAR.  Butch Penny with [facility] aware of discharge today.    Expected Discharge Plan: [place] Barriers to Discharge: Continued Medical Work up  Expected Discharge Plan and Services Expected Discharge Plan: [place]   Discharge Planning Services: CM Consult Post Acute Care Choice: [place] arrangements for the past 2 months: Single Family Home Expected Discharge Date: 05/30/20               DME Arranged: N/A DME Agency: NA       HH Arranged: RN, Disease Management, PT, OT HH Agency: [facility] ([place]) Date HH Agency Contacted: 05/21/20 Time [place]: [place] Representative spoke with at [place]: [place] ([place]) Interventions    Readmission Risk Interventions No flowsheet data found.

## 2020-05-31 ENCOUNTER — Encounter (HOSPITAL_COMMUNITY): Payer: Self-pay | Admitting: Emergency Medicine

## 2020-05-31 ENCOUNTER — Inpatient Hospital Stay (HOSPITAL_COMMUNITY)
Admission: EM | Admit: 2020-05-31 | Discharge: 2020-06-14 | DRG: 330 | Disposition: A | Discharge status: Skilled Nursing Facility | Payer: Medicare Other | Attending: Internal Medicine | Admitting: Internal Medicine

## 2020-05-31 ENCOUNTER — Telehealth: Payer: Medicare Other | Admitting: Cardiovascular Disease

## 2020-05-31 DIAGNOSIS — N179 Acute kidney failure, unspecified: Secondary | ICD-10-CM | POA: Diagnosis present

## 2020-05-31 DIAGNOSIS — I130 Hypertensive heart and chronic kidney disease with heart failure and stage 1 through stage 4 chronic kidney disease, or unspecified chronic kidney disease: Secondary | ICD-10-CM | POA: Diagnosis not present

## 2020-05-31 DIAGNOSIS — E440 Moderate protein-calorie malnutrition: Secondary | ICD-10-CM | POA: Insufficient documentation

## 2020-05-31 DIAGNOSIS — E871 Hypo-osmolality and hyponatremia: Secondary | ICD-10-CM | POA: Diagnosis present

## 2020-05-31 DIAGNOSIS — I5032 Chronic diastolic (congestive) heart failure: Secondary | ICD-10-CM | POA: Diagnosis not present

## 2020-05-31 DIAGNOSIS — A0472 Enterocolitis due to Clostridium difficile, not specified as recurrent: Principal | ICD-10-CM | POA: Diagnosis present

## 2020-05-31 DIAGNOSIS — I10 Essential (primary) hypertension: Secondary | ICD-10-CM | POA: Diagnosis not present

## 2020-05-31 DIAGNOSIS — Z7982 Long term (current) use of aspirin: Secondary | ICD-10-CM

## 2020-05-31 DIAGNOSIS — E785 Hyperlipidemia, unspecified: Secondary | ICD-10-CM | POA: Diagnosis present

## 2020-05-31 DIAGNOSIS — D6489 Other specified anemias: Secondary | ICD-10-CM | POA: Diagnosis present

## 2020-05-31 DIAGNOSIS — G8929 Other chronic pain: Secondary | ICD-10-CM | POA: Diagnosis not present

## 2020-05-31 DIAGNOSIS — K559 Vascular disorder of intestine, unspecified: Secondary | ICD-10-CM

## 2020-05-31 DIAGNOSIS — E7800 Pure hypercholesterolemia, unspecified: Secondary | ICD-10-CM | POA: Diagnosis present

## 2020-05-31 DIAGNOSIS — Z9119 Patient's noncompliance with other medical treatment and regimen: Secondary | ICD-10-CM

## 2020-05-31 DIAGNOSIS — Z20822 Contact with and (suspected) exposure to covid-19: Secondary | ICD-10-CM | POA: Diagnosis not present

## 2020-05-31 DIAGNOSIS — K5669 Other partial intestinal obstruction: Secondary | ICD-10-CM | POA: Diagnosis not present

## 2020-05-31 DIAGNOSIS — L89150 Pressure ulcer of sacral region, unstageable: Secondary | ICD-10-CM | POA: Diagnosis present

## 2020-05-31 DIAGNOSIS — E860 Dehydration: Secondary | ICD-10-CM | POA: Diagnosis present

## 2020-05-31 DIAGNOSIS — N182 Chronic kidney disease, stage 2 (mild): Secondary | ICD-10-CM | POA: Diagnosis not present

## 2020-05-31 DIAGNOSIS — E1151 Type 2 diabetes mellitus with diabetic peripheral angiopathy without gangrene: Secondary | ICD-10-CM | POA: Diagnosis not present

## 2020-05-31 DIAGNOSIS — F329 Major depressive disorder, single episode, unspecified: Secondary | ICD-10-CM | POA: Diagnosis present

## 2020-05-31 DIAGNOSIS — Z66 Do not resuscitate: Secondary | ICD-10-CM | POA: Diagnosis present

## 2020-05-31 DIAGNOSIS — E861 Hypovolemia: Secondary | ICD-10-CM | POA: Diagnosis not present

## 2020-05-31 DIAGNOSIS — K56609 Unspecified intestinal obstruction, unspecified as to partial versus complete obstruction: Secondary | ICD-10-CM | POA: Diagnosis not present

## 2020-05-31 DIAGNOSIS — I509 Heart failure, unspecified: Secondary | ICD-10-CM

## 2020-05-31 DIAGNOSIS — R1084 Generalized abdominal pain: Secondary | ICD-10-CM | POA: Diagnosis not present

## 2020-05-31 DIAGNOSIS — M549 Dorsalgia, unspecified: Secondary | ICD-10-CM | POA: Diagnosis present

## 2020-05-31 DIAGNOSIS — E1122 Type 2 diabetes mellitus with diabetic chronic kidney disease: Secondary | ICD-10-CM | POA: Diagnosis not present

## 2020-05-31 DIAGNOSIS — R109 Unspecified abdominal pain: Secondary | ICD-10-CM | POA: Diagnosis present

## 2020-05-31 DIAGNOSIS — B373 Candidiasis of vulva and vagina: Secondary | ICD-10-CM | POA: Diagnosis present

## 2020-05-31 DIAGNOSIS — E876 Hypokalemia: Secondary | ICD-10-CM

## 2020-05-31 DIAGNOSIS — I779 Disorder of arteries and arterioles, unspecified: Secondary | ICD-10-CM | POA: Diagnosis not present

## 2020-05-31 DIAGNOSIS — Z87891 Personal history of nicotine dependence: Secondary | ICD-10-CM

## 2020-05-31 DIAGNOSIS — K660 Peritoneal adhesions (postprocedural) (postinfection): Secondary | ICD-10-CM | POA: Diagnosis present

## 2020-05-31 DIAGNOSIS — Z79899 Other long term (current) drug therapy: Secondary | ICD-10-CM

## 2020-05-31 LAB — CBC
HCT: 37.8 % (ref 36.0–46.0)
Hemoglobin: 12 g/dL (ref 12.0–15.0)
MCH: 29.9 pg (ref 26.0–34.0)
MCHC: 31.7 g/dL (ref 30.0–36.0)
MCV: 94.3 fL (ref 80.0–100.0)
Platelets: 396 10*3/uL (ref 150–400)
RBC: 4.01 MIL/uL (ref 3.87–5.11)
RDW: 17.3 % — ABNORMAL HIGH (ref 11.5–15.5)
WBC: 14.4 10*3/uL — ABNORMAL HIGH (ref 4.0–10.5)
nRBC: 0 % (ref 0.0–0.2)

## 2020-05-31 LAB — LIPASE, BLOOD: Lipase: 31 U/L (ref 11–51)

## 2020-05-31 LAB — COMPREHENSIVE METABOLIC PANEL
ALT: 12 U/L (ref 0–44)
AST: 21 U/L (ref 15–41)
Albumin: 2.6 g/dL — ABNORMAL LOW (ref 3.5–5.0)
Alkaline Phosphatase: 58 U/L (ref 38–126)
Anion gap: 13 (ref 5–15)
BUN: 7 mg/dL — ABNORMAL LOW (ref 8–23)
CO2: 23 mmol/L (ref 22–32)
Calcium: 8.4 mg/dL — ABNORMAL LOW (ref 8.9–10.3)
Chloride: 96 mmol/L — ABNORMAL LOW (ref 98–111)
Creatinine, Ser: 1.14 mg/dL — ABNORMAL HIGH (ref 0.44–1.00)
GFR calc Af Amer: 58 mL/min — ABNORMAL LOW (ref >60)
GFR calc non Af Amer: 50 mL/min — ABNORMAL LOW (ref >60)
Glucose, Bld: 158 mg/dL — ABNORMAL HIGH (ref 70–99)
Potassium: 2.9 mmol/L — ABNORMAL LOW (ref 3.5–5.1)
Sodium: 132 mmol/L — ABNORMAL LOW (ref 135–145)
Total Bilirubin: 0.5 mg/dL (ref 0.3–1.2)
Total Protein: 7.1 g/dL (ref 6.5–8.1)

## 2020-05-31 MED ORDER — SODIUM CHLORIDE 0.9% FLUSH
3.0000 mL | Freq: Once | INTRAVENOUS | Status: AC
  Administered 2020-06-08: 3 mL via INTRAVENOUS

## 2020-05-31 NOTE — ED Triage Notes (Signed)
Patient states "I am sick" and will not elaborate any further. Patient was to have surgical intervention for sigmoid stricture with Dr Ninfa Linden but patient refused care and very adamant about leaving. Patient was also diagnosed with C.Diff

## 2020-06-01 ENCOUNTER — Inpatient Hospital Stay (HOSPITAL_COMMUNITY): Payer: Medicare Other

## 2020-06-01 DIAGNOSIS — E7800 Pure hypercholesterolemia, unspecified: Secondary | ICD-10-CM

## 2020-06-01 DIAGNOSIS — K56609 Unspecified intestinal obstruction, unspecified as to partial versus complete obstruction: Secondary | ICD-10-CM | POA: Diagnosis not present

## 2020-06-01 DIAGNOSIS — R1084 Generalized abdominal pain: Secondary | ICD-10-CM | POA: Diagnosis not present

## 2020-06-01 DIAGNOSIS — L89150 Pressure ulcer of sacral region, unstageable: Secondary | ICD-10-CM | POA: Diagnosis present

## 2020-06-01 DIAGNOSIS — I110 Hypertensive heart disease with heart failure: Secondary | ICD-10-CM | POA: Diagnosis not present

## 2020-06-01 DIAGNOSIS — E876 Hypokalemia: Secondary | ICD-10-CM

## 2020-06-01 DIAGNOSIS — I10 Essential (primary) hypertension: Secondary | ICD-10-CM

## 2020-06-01 DIAGNOSIS — R6889 Other general symptoms and signs: Secondary | ICD-10-CM | POA: Diagnosis not present

## 2020-06-01 DIAGNOSIS — I5032 Chronic diastolic (congestive) heart failure: Secondary | ICD-10-CM

## 2020-06-01 DIAGNOSIS — E861 Hypovolemia: Secondary | ICD-10-CM | POA: Diagnosis not present

## 2020-06-01 DIAGNOSIS — I779 Disorder of arteries and arterioles, unspecified: Secondary | ICD-10-CM

## 2020-06-01 DIAGNOSIS — R262 Difficulty in walking, not elsewhere classified: Secondary | ICD-10-CM | POA: Diagnosis not present

## 2020-06-01 DIAGNOSIS — B373 Candidiasis of vulva and vagina: Secondary | ICD-10-CM | POA: Diagnosis present

## 2020-06-01 DIAGNOSIS — E871 Hypo-osmolality and hyponatremia: Secondary | ICD-10-CM

## 2020-06-01 DIAGNOSIS — Z20822 Contact with and (suspected) exposure to covid-19: Secondary | ICD-10-CM | POA: Diagnosis present

## 2020-06-01 DIAGNOSIS — Z933 Colostomy status: Secondary | ICD-10-CM | POA: Diagnosis not present

## 2020-06-01 DIAGNOSIS — E440 Moderate protein-calorie malnutrition: Secondary | ICD-10-CM | POA: Diagnosis present

## 2020-06-01 DIAGNOSIS — M549 Dorsalgia, unspecified: Secondary | ICD-10-CM | POA: Diagnosis present

## 2020-06-01 DIAGNOSIS — I517 Cardiomegaly: Secondary | ICD-10-CM | POA: Diagnosis not present

## 2020-06-01 DIAGNOSIS — I130 Hypertensive heart and chronic kidney disease with heart failure and stage 1 through stage 4 chronic kidney disease, or unspecified chronic kidney disease: Secondary | ICD-10-CM | POA: Diagnosis not present

## 2020-06-01 DIAGNOSIS — A0472 Enterocolitis due to Clostridium difficile, not specified as recurrent: Secondary | ICD-10-CM | POA: Diagnosis not present

## 2020-06-01 DIAGNOSIS — Z66 Do not resuscitate: Secondary | ICD-10-CM | POA: Diagnosis not present

## 2020-06-01 DIAGNOSIS — I509 Heart failure, unspecified: Secondary | ICD-10-CM | POA: Diagnosis not present

## 2020-06-01 DIAGNOSIS — K6389 Other specified diseases of intestine: Secondary | ICD-10-CM | POA: Diagnosis not present

## 2020-06-01 DIAGNOSIS — K559 Vascular disorder of intestine, unspecified: Secondary | ICD-10-CM | POA: Diagnosis not present

## 2020-06-01 DIAGNOSIS — M2550 Pain in unspecified joint: Secondary | ICD-10-CM | POA: Diagnosis not present

## 2020-06-01 DIAGNOSIS — F329 Major depressive disorder, single episode, unspecified: Secondary | ICD-10-CM | POA: Diagnosis present

## 2020-06-01 DIAGNOSIS — K565 Intestinal adhesions [bands], unspecified as to partial versus complete obstruction: Secondary | ICD-10-CM | POA: Diagnosis not present

## 2020-06-01 DIAGNOSIS — R109 Unspecified abdominal pain: Secondary | ICD-10-CM | POA: Diagnosis not present

## 2020-06-01 DIAGNOSIS — E46 Unspecified protein-calorie malnutrition: Secondary | ICD-10-CM | POA: Diagnosis not present

## 2020-06-01 DIAGNOSIS — Z95828 Presence of other vascular implants and grafts: Secondary | ICD-10-CM | POA: Diagnosis not present

## 2020-06-01 DIAGNOSIS — K5150 Left sided colitis without complications: Secondary | ICD-10-CM | POA: Diagnosis not present

## 2020-06-01 DIAGNOSIS — N182 Chronic kidney disease, stage 2 (mild): Secondary | ICD-10-CM | POA: Diagnosis present

## 2020-06-01 DIAGNOSIS — N179 Acute kidney failure, unspecified: Secondary | ICD-10-CM | POA: Diagnosis not present

## 2020-06-01 DIAGNOSIS — R030 Elevated blood-pressure reading, without diagnosis of hypertension: Secondary | ICD-10-CM | POA: Diagnosis not present

## 2020-06-01 DIAGNOSIS — D649 Anemia, unspecified: Secondary | ICD-10-CM | POA: Diagnosis not present

## 2020-06-01 DIAGNOSIS — I739 Peripheral vascular disease, unspecified: Secondary | ICD-10-CM | POA: Diagnosis not present

## 2020-06-01 DIAGNOSIS — E1122 Type 2 diabetes mellitus with diabetic chronic kidney disease: Secondary | ICD-10-CM | POA: Diagnosis present

## 2020-06-01 DIAGNOSIS — S31109A Unspecified open wound of abdominal wall, unspecified quadrant without penetration into peritoneal cavity, initial encounter: Secondary | ICD-10-CM | POA: Diagnosis not present

## 2020-06-01 DIAGNOSIS — E1151 Type 2 diabetes mellitus with diabetic peripheral angiopathy without gangrene: Secondary | ICD-10-CM | POA: Diagnosis not present

## 2020-06-01 DIAGNOSIS — Z7401 Bed confinement status: Secondary | ICD-10-CM | POA: Diagnosis not present

## 2020-06-01 DIAGNOSIS — Z743 Need for continuous supervision: Secondary | ICD-10-CM | POA: Diagnosis not present

## 2020-06-01 DIAGNOSIS — K55039 Acute (reversible) ischemia of large intestine, extent unspecified: Secondary | ICD-10-CM | POA: Diagnosis not present

## 2020-06-01 DIAGNOSIS — K5939 Other megacolon: Secondary | ICD-10-CM | POA: Diagnosis not present

## 2020-06-01 DIAGNOSIS — G8929 Other chronic pain: Secondary | ICD-10-CM | POA: Diagnosis present

## 2020-06-01 DIAGNOSIS — J310 Chronic rhinitis: Secondary | ICD-10-CM | POA: Diagnosis not present

## 2020-06-01 DIAGNOSIS — M6281 Muscle weakness (generalized): Secondary | ICD-10-CM | POA: Diagnosis not present

## 2020-06-01 DIAGNOSIS — E860 Dehydration: Secondary | ICD-10-CM | POA: Diagnosis not present

## 2020-06-01 DIAGNOSIS — E785 Hyperlipidemia, unspecified: Secondary | ICD-10-CM | POA: Diagnosis present

## 2020-06-01 DIAGNOSIS — R197 Diarrhea, unspecified: Secondary | ICD-10-CM | POA: Diagnosis not present

## 2020-06-01 DIAGNOSIS — I700 Atherosclerosis of aorta: Secondary | ICD-10-CM | POA: Diagnosis not present

## 2020-06-01 DIAGNOSIS — K5669 Other partial intestinal obstruction: Secondary | ICD-10-CM | POA: Diagnosis not present

## 2020-06-01 DIAGNOSIS — J449 Chronic obstructive pulmonary disease, unspecified: Secondary | ICD-10-CM | POA: Diagnosis not present

## 2020-06-01 DIAGNOSIS — R1311 Dysphagia, oral phase: Secondary | ICD-10-CM | POA: Diagnosis not present

## 2020-06-01 LAB — LACTIC ACID, PLASMA
Lactic Acid, Venous: 1.1 mmol/L (ref 0.5–1.9)
Lactic Acid, Venous: 0.9 mmol/L (ref 0.5–1.9)

## 2020-06-01 LAB — CBC
HCT: 31.7 % — ABNORMAL LOW (ref 36.0–46.0)
Hemoglobin: 9.7 g/dL — ABNORMAL LOW (ref 12.0–15.0)
MCH: 29.6 pg (ref 26.0–34.0)
MCHC: 30.6 g/dL (ref 30.0–36.0)
MCV: 96.6 fL (ref 80.0–100.0)
Platelets: 271 10*3/uL (ref 150–400)
RBC: 3.28 MIL/uL — ABNORMAL LOW (ref 3.87–5.11)
RDW: 17.5 % — ABNORMAL HIGH (ref 11.5–15.5)
WBC: 8.6 10*3/uL (ref 4.0–10.5)
nRBC: 0 % (ref 0.0–0.2)

## 2020-06-01 LAB — BASIC METABOLIC PANEL
Anion gap: 9 (ref 5–15)
BUN: 9 mg/dL (ref 8–23)
CO2: 25 mmol/L (ref 22–32)
Calcium: 7.8 mg/dL — ABNORMAL LOW (ref 8.9–10.3)
Chloride: 102 mmol/L (ref 98–111)
Creatinine, Ser: 1.23 mg/dL — ABNORMAL HIGH (ref 0.44–1.00)
GFR calc Af Amer: 53 mL/min — ABNORMAL LOW (ref >60)
GFR calc non Af Amer: 45 mL/min — ABNORMAL LOW (ref >60)
Glucose, Bld: 101 mg/dL — ABNORMAL HIGH (ref 70–99)
Potassium: 2.9 mmol/L — ABNORMAL LOW (ref 3.5–5.1)
Sodium: 136 mmol/L (ref 135–145)

## 2020-06-01 LAB — GLUCOSE, CAPILLARY
Glucose-Capillary: 89 mg/dL (ref 70–99)
Glucose-Capillary: 85 mg/dL (ref 70–99)

## 2020-06-01 LAB — CBG MONITORING, ED: Glucose-Capillary: 95 mg/dL (ref 70–99)

## 2020-06-01 LAB — MAGNESIUM: Magnesium: 1.6 mg/dL — ABNORMAL LOW (ref 1.7–2.4)

## 2020-06-01 LAB — SARS CORONAVIRUS 2 BY RT PCR (HOSPITAL ORDER, PERFORMED IN ~~LOC~~ HOSPITAL LAB): SARS Coronavirus 2: NEGATIVE

## 2020-06-01 MED ORDER — NYSTATIN 100000 UNIT/GM EX CREA
TOPICAL_CREAM | Freq: Two times a day (BID) | CUTANEOUS | Status: DC
  Filled 2020-06-01 (×2): qty 15

## 2020-06-01 MED ORDER — HEPARIN SODIUM (PORCINE) 5000 UNIT/ML IJ SOLN
5000.0000 [IU] | Freq: Three times a day (TID) | INTRAMUSCULAR | Status: DC
  Administered 2020-06-01 – 2020-06-05 (×11): 5000 [IU] via SUBCUTANEOUS
  Filled 2020-06-01 (×11): qty 1

## 2020-06-01 MED ORDER — POTASSIUM CHLORIDE CRYS ER 20 MEQ PO TBCR
20.0000 meq | EXTENDED_RELEASE_TABLET | Freq: Every day | ORAL | Status: DC
  Administered 2020-06-01 – 2020-06-05 (×4): 20 meq via ORAL
  Filled 2020-06-01 (×6): qty 1

## 2020-06-01 MED ORDER — ARFORMOTEROL TARTRATE 15 MCG/2ML IN NEBU: 15.0000 ug | INHALATION_SOLUTION | Freq: Two times a day (BID) | RESPIRATORY_TRACT | Status: DC

## 2020-06-01 MED ORDER — SODIUM CHLORIDE 0.9 % IV BOLUS
1000.0000 mL | Freq: Once | INTRAVENOUS | Status: AC
  Administered 2020-06-01: 1000 mL via INTRAVENOUS

## 2020-06-01 MED ORDER — SODIUM CHLORIDE 0.9% FLUSH
3.0000 mL | Freq: Two times a day (BID) | INTRAVENOUS | Status: DC
  Administered 2020-06-01 – 2020-06-11 (×13): 3 mL via INTRAVENOUS

## 2020-06-01 MED ORDER — FENTANYL CITRATE (PF) 100 MCG/2ML IJ SOLN
25.0000 ug | Freq: Once | INTRAMUSCULAR | Status: AC
  Administered 2020-06-02: 25 ug via INTRAVENOUS
  Filled 2020-06-01: qty 2

## 2020-06-01 MED ORDER — CARVEDILOL 3.125 MG PO TABS
3.1250 mg | ORAL_TABLET | Freq: Two times a day (BID) | ORAL | Status: DC
  Administered 2020-06-01: 3.125 mg via ORAL
  Filled 2020-06-01 (×2): qty 1

## 2020-06-01 MED ORDER — PAROXETINE HCL 20 MG PO TABS
20.0000 mg | ORAL_TABLET | Freq: Every day | ORAL | Status: DC
  Administered 2020-06-01 – 2020-06-14 (×12): 20 mg via ORAL
  Filled 2020-06-01 (×14): qty 1

## 2020-06-01 MED ORDER — POTASSIUM CHLORIDE 10 MEQ/100ML IV SOLN
10.0000 meq | INTRAVENOUS | Status: AC
  Administered 2020-06-01 (×4): 10 meq via INTRAVENOUS
  Filled 2020-06-01 (×4): qty 100

## 2020-06-01 MED ORDER — FENTANYL CITRATE (PF) 100 MCG/2ML IJ SOLN
50.0000 ug | Freq: Once | INTRAMUSCULAR | Status: AC
  Administered 2020-06-01: 50 ug via INTRAVENOUS
  Filled 2020-06-01: qty 2

## 2020-06-01 MED ORDER — METRONIDAZOLE 500 MG PO TABS
500.0000 mg | ORAL_TABLET | Freq: Three times a day (TID) | ORAL | Status: DC
  Administered 2020-06-01 (×2): 500 mg via ORAL
  Filled 2020-06-01 (×2): qty 1

## 2020-06-01 MED ORDER — MORPHINE SULFATE (PF) 2 MG/ML IV SOLN: 2.0000 mg | INTRAVENOUS | Status: DC | PRN

## 2020-06-01 MED ORDER — ACETAMINOPHEN 325 MG PO TABS: 650.0000 mg | ORAL_TABLET | Freq: Four times a day (QID) | ORAL | Status: DC | PRN

## 2020-06-01 MED ORDER — ONDANSETRON HCL 4 MG/2ML IJ SOLN
4.0000 mg | Freq: Once | INTRAMUSCULAR | Status: AC
  Administered 2020-06-01: 4 mg via INTRAVENOUS
  Filled 2020-06-01: qty 2

## 2020-06-01 MED ORDER — METOPROLOL TARTRATE 5 MG/5ML IV SOLN: 5.0000 mg | Freq: Four times a day (QID) | INTRAVENOUS | Status: DC | PRN

## 2020-06-01 MED ORDER — OXYCODONE HCL 5 MG PO TABS: 5.0000 mg | ORAL_TABLET | ORAL | Status: DC | PRN

## 2020-06-01 MED ORDER — INSULIN ASPART 100 UNIT/ML ~~LOC~~ SOLN: 0.0000 [IU] | Freq: Three times a day (TID) | SUBCUTANEOUS | Status: DC

## 2020-06-01 MED ORDER — MAGNESIUM SULFATE 2 GM/50ML IV SOLN
2.0000 g | Freq: Once | INTRAVENOUS | Status: AC
  Administered 2020-06-01: 2 g via INTRAVENOUS
  Filled 2020-06-01: qty 50

## 2020-06-01 MED ORDER — ONDANSETRON HCL 4 MG PO TABS: 4.0000 mg | ORAL_TABLET | Freq: Four times a day (QID) | ORAL | Status: DC | PRN

## 2020-06-01 MED ORDER — CARVEDILOL 3.125 MG PO TABS
3.1250 mg | ORAL_TABLET | Freq: Two times a day (BID) | ORAL | Status: DC
  Administered 2020-06-01 – 2020-06-14 (×23): 3.125 mg via ORAL
  Filled 2020-06-01 (×27): qty 1

## 2020-06-01 MED ORDER — ATORVASTATIN CALCIUM 40 MG PO TABS
40.0000 mg | ORAL_TABLET | Freq: Every day | ORAL | Status: DC
  Administered 2020-06-01 – 2020-06-14 (×12): 40 mg via ORAL
  Filled 2020-06-01 (×14): qty 1

## 2020-06-01 MED ORDER — ALBUTEROL SULFATE (2.5 MG/3ML) 0.083% IN NEBU
2.5000 mg | INHALATION_SOLUTION | Freq: Two times a day (BID) | RESPIRATORY_TRACT | Status: DC
  Filled 2020-06-01 (×2): qty 3

## 2020-06-01 MED ORDER — ASPIRIN EC 81 MG PO TBEC
81.0000 mg | DELAYED_RELEASE_TABLET | Freq: Every day | ORAL | Status: DC
  Administered 2020-06-01 – 2020-06-14 (×12): 81 mg via ORAL
  Filled 2020-06-01 (×14): qty 1

## 2020-06-01 MED ORDER — ONDANSETRON HCL 4 MG/2ML IJ SOLN
4.0000 mg | Freq: Four times a day (QID) | INTRAMUSCULAR | Status: DC | PRN
  Administered 2020-06-06: 4 mg via INTRAVENOUS

## 2020-06-01 MED ORDER — ACETAMINOPHEN 650 MG RE SUPP: 650.0000 mg | Freq: Four times a day (QID) | RECTAL | Status: DC | PRN

## 2020-06-01 NOTE — ED Notes (Signed)
Wheeled pt to bathroom she was unable to go.

## 2020-06-01 NOTE — Progress Notes (Signed)
   Subjective/Chief Complaint: Having diarrhea, no emesis   Objective: Vital signs in last 24 hours: Temp:  [97.7 F (36.5 C)-98.2 F (36.8 C)] 97.7 F (36.5 C) (07/31 0208) Pulse Rate:  [67-102] 67 (07/31 1157) Resp:  [16-18] 17 (07/31 1157) BP: (90-155)/(43-66) 155/58 (07/31 1157) SpO2:  [94 %-99 %] 97 % (07/31 1157)    Intake/Output from previous day: No intake/output data recorded. Intake/Output this shift: No intake/output data recorded.  GI mild distention, nontender, well healed scar Lab Results:  Recent Labs    05/31/20 2038 06/01/20 1251  WBC 14.4* 8.6  HGB 12.0 9.7*  HCT 37.8 31.7*  PLT 396 271   BMET Recent Labs    05/31/20 2038 06/01/20 1251  NA 132* 136  K 2.9* 2.9*  CL 96* 102  CO2 23 25  GLUCOSE 158* 101*  BUN 7* 9  CREATININE 1.14* 1.23*  CALCIUM 8.4* 7.8*   PT/INR No results for input(s): LABPROT, INR in the last 72 hours. ABG No results for input(s): PHART, HCO3 in the last 72 hours.  Invalid input(s): PCO2, PO2  Studies/Results: DG Abdomen Acute W/Chest  Result Date: 06/01/2020 CLINICAL DATA:  C difficile colitis EXAM: DG ABDOMEN ACUTE W/ 1V CHEST COMPARISON:  Chest x-ray 04/22/2020 FINDINGS: Cardiomegaly and low inspiratory volumes. Atherosclerotic calcifications are present in the transverse aorta. No free air on the upright radiograph. Mild gaseous distension of small bowel in the left lower quadrant. Maximal bowel diameter 2.6 cm. Normal colonic gas pattern. No evidence of colonic obstruction. Surgical clips in the right upper quadrant consistent with prior cholecystectomy. Extensive prior thoracolumbosacral fusion surgical changes. IMPRESSION: 1. No acute cardiopulmonary process. 2. Single loop of mildly dilated and air-filled small bowel in the left lower quadrant. Differential considerations include early or partial small bowel obstruction versus local ileus related to an intra-abdominal infectious/inflammatory process. 3. Aortic  atherosclerosis. Electronically Signed   By: Jacqulynn Cadet M.D.   On: 06/01/2020 09:52    Anti-infectives: Anti-infectives (From admission, onward)   Start     Dose/Rate Route Frequency Ordered Stop   06/01/20 1130  metroNIDAZOLE (FLAGYL) tablet 500 mg     Discontinue     500 mg Oral 3 times daily 06/01/20 1124        Assessment/Plan: Ischemic Colitis  Sigmoid colonic stricture causing partial large bowel obstruction  -likely will just need surgery some point, no need for ng right now -can have some clears -agree with medical treatment -optimize and consider surgery at some point  Rolm Bookbinder 06/01/2020

## 2020-06-01 NOTE — H&P (Addendum)
History and Physical        Hospital Admission Note Date: 06/01/2020  Patient name: Tonya Hughes Medical record number: [id number] Date of birth: [birth year] Age: 69 y.o. Gender: female  PCP: Monico Blitz, MD  Patient coming from: Home Lives with: Husband and son At baseline, ambulates: Psychiatric nurse Complaint  Patient presents with  . Abdominal Pain      HPI:   This is a 69 year old female with past medical history of HFpEF (last EF 60 to 65% on June 11) diabetes, DVT, hypertension, hyperlipidemia, PAD s/p aortobifemoral bypass (04/18/2020) who presented for evaluation of ongoing worsening abdominal pain.    She did have a recent admission for abdominal pain from 7/16-7/29/2021 for concern of ischemic colitis.  Also noted to be C. difficile positive at that time and started on IV antibiotics and oral vancomycin but was discharged on p.o. Flagyl as she refused vancomycin and also developed rectal bleeding and required 2 unit PRBCs.  GI was consulted and underwent flex sig on 7/19 and also noted to have a colonic stricture with pathology consistent for ischemic colitis.  GI recommended surgical consult who recommended surgical intervention however patient had refused at the time.  Since her discharge her pain has worsened and is now amenable to surgery.  Pain along the midline of the abdomen radiating all over which worsens with movement and improves with rest and laying on her side.  Associated with nausea without vomiting.  Reports she has been compliant with her Flagyl at home  ED Course: Afebrile with borderline low BPs.  Will labs: Sodium 132, K2.9, creatinine 1.14 (0.732 days ago), WBC 14.4 (6.32 days ago).  Abdominal x-ray ordered to evaluate for perforation.  She was given Zofran, 1 L NS bolus and fentanyl.  General surgery consulted  Vitals:   06/01/20  0830 06/01/20 0831  BP: (!) 102/43   Pulse:  75  Resp:    Temp:    SpO2:  96%     Review of Systems:  Review of Systems  Constitutional: Negative for chills and fever.  Respiratory: Negative for cough and shortness of breath.   Cardiovascular: Negative.  Negative for chest pain and palpitations.  Gastrointestinal: Positive for diarrhea and nausea. Negative for blood in stool and vomiting.  Genitourinary: Negative.  Negative for dysuria.  Musculoskeletal:       Has not been very ambulatory since getting home  Skin: Negative for itching.  All other systems reviewed and are negative.   Medical/Social/Family History   Past Medical History: Past Medical History:  Diagnosis Date  . Arthritis    HANDS  . Back pain   . Congestive heart failure (CHF) ([place])   . Depressive disorder   . Diabetes mellitus without complication ([place])    new, A1c 6.9% 04/2020  . DVT (deep venous thrombosis) ([place])    Right LE s/p back surgery stop date lovenox 09/05/17  . Dyspnea   . Gastritis   . GERD (gastroesophageal reflux disease)   . Hypercholesteremia   . Hypertension   . Pneumonia     Past Surgical History:  Procedure Laterality Date  . ABDOMINAL AORTOGRAM W/LOWER EXTREMITY Bilateral 11/13/2019  Procedure: ABDOMINAL AORTOGRAM W/LOWER EXTREMITY;  Surgeon: Waynetta Sandy, MD;  Location: [facility];  Service: Cardiovascular;  Laterality: Bilateral;  . ABDOMINAL HYSTERECTOMY    . AORTA - BILATERAL FEMORAL ARTERY BYPASS GRAFT N/A 04/18/2020   Procedure: AORTA BIFEMORAL BYPASS;  Surgeon: Waynetta Sandy, MD;  Location: [facility] OR;  Service: Vascular;  Laterality: N/A;  . AORTIC ENDARTERECETOMY N/A 04/18/2020   Procedure: abdominal AORTIC ENDARTERECETOMY;  Surgeon: Waynetta Sandy, MD;  Location: [facility];  Service: Vascular;  Laterality: N/A;  . APPENDECTOMY    . APPLICATION OF WOUND VAC Bilateral 05/27/2020   Procedure: APPLICATION OF WOUND VAC;  Surgeon: Elam Dutch, MD;  Location: [facility];  Service: Vascular;  Laterality: Bilateral;  . BACK SURGERY    . BIOPSY  05/20/2020   Procedure: BIOPSY;  Surgeon: Arta Silence, MD;  Location: [facility];  Service: Endoscopy;;  . CERVICAL SPINE SURGERY    . CHOLECYSTECTOMY    . COLONOSCOPY N/A 04/19/2020   Procedure: COLONOSCOPY;  Surgeon: Ronald Lobo, MD;  Location: [facility] ENDOSCOPY;  Service: Endoscopy;  Laterality: N/A;  bedside exam, pt on ventilator  . DIAGNOSTIC LAPAROSCOPY     lap. chole  . ENDARTERECTOMY FEMORAL Right 04/18/2020   Procedure: RIGHT FEMORAL ENDARTERECTOMY;  Surgeon: Waynetta Sandy, MD;  Location: [facility];  Service: Vascular;  Laterality: Right;  . FLEXIBLE SIGMOIDOSCOPY N/A 05/20/2020   Procedure: FLEXIBLE SIGMOIDOSCOPY;  Surgeon: Arta Silence, MD;  Location: [facility] ENDOSCOPY;  Service: Endoscopy;  Laterality: N/A;  . LUMBAR SPINE SURGERY    . WOUND DEBRIDEMENT Bilateral 05/27/2020   Procedure: BILATERAL GROIN DEBRIDEMENT;  Surgeon: Elam Dutch, MD;  Location: [facility] OR;  Service: Vascular;  Laterality: Bilateral;    Medications: Prior to Admission medications   Medication Sig Start Date End Date Taking? Authorizing Provider  aspirin EC 81 MG tablet Take 81 mg by mouth daily.   Yes [provider]  atorvastatin (LIPITOR) 40 MG tablet Take 1 tablet (40 mg total) by mouth daily. Patient taking differently: Take 40 mg by mouth at bedtime.  03/25/20 06/23/20 Yes Herminio Commons, MD  carvedilol (COREG) 3.125 MG tablet TAKE ONE TABLET BY MOUTH TWICE DAILY Patient taking differently: Take 3.125 mg by mouth 2 (two) times daily.  09/21/19  Yes Herminio Commons, MD  fexofenadine (ALLEGRA) 180 MG tablet Take 180 mg by mouth daily.   Yes [provider]  ibuprofen (ADVIL) 200 MG tablet Take 600 mg by mouth every 6 (six) hours as needed for headache or mild pain.   Yes [provider]  lisinopril (PRINIVIL,ZESTRIL) 5 MG tablet Take 5 mg by mouth  daily.   Yes [provider]  metroNIDAZOLE (FLAGYL) 500 MG tablet Take 1 tablet (500 mg total) by mouth 3 (three) times daily for 5 days. 05/30/20 06/04/20 Yes Donne Hazel, MD  PARoxetine (PAXIL) 20 MG tablet Take 20 mg by mouth daily.   Yes [provider]  potassium chloride SA (K-DUR,KLOR-CON) 20 MEQ tablet TAKE ONE (1) TABLET EACH DAY Patient taking differently: Take 20 mEq by mouth daily.  01/03/19  Yes Herminio Commons, MD  albuterol (PROVENTIL) (2.5 MG/3ML) 0.083% nebulizer solution Take 3 mLs (2.5 mg total) by nebulization 2 (two) times daily. Patient not taking: Reported on 06/01/2020 04/29/20   Gabriel Earing, PA-C  arformoterol (BROVANA) 15 MCG/2ML NEBU Take 2 mLs (15 mcg total) by nebulization 2 (two) times daily. Patient not taking: Reported on 06/01/2020 04/29/20  Gabriel Earing, PA-C    Allergies:   Allergies  Allergen Reactions  . Zanaflex [Tizanidine Hcl] Hives and Swelling  . Penicillins Itching and Rash    Did it involve swelling of the face/tongue/throat, SOB, or low BP? No Did it involve sudden or severe rash/hives, skin peeling, or any reaction on the inside of your mouth or nose? Yes Did you need to seek medical attention at a hospital or doctor's office? Yes When did it last happen?40 + years ago If all above answers are "NO", may proceed with cephalosporin use.     Social History:  reports that she has quit smoking. Her smoking use included cigarettes. She has a 40.00 pack-year smoking history. She has never used smokeless tobacco. She reports previous alcohol use. She reports that she does not use drugs.  Family History: Family History  Problem Relation Age of Onset  . Hypertension Father      Objective   Physical Exam: Blood pressure (!) 102/43, pulse 75, temperature 97.7 F (36.5 C), temperature source Oral, resp. rate 16, SpO2 96 %.  Physical Exam Vitals and nursing note reviewed.  Constitutional:      Comments:  Appears uncomfortable  HENT:     Head: Normocephalic.  Cardiovascular:     Rate and Rhythm: Normal rate and regular rhythm.  Pulmonary:     Effort: Pulmonary effort is normal.     Breath sounds: Normal breath sounds.  Abdominal:     General: Abdomen is flat. Bowel sounds are normal.     Tenderness: There is no abdominal tenderness.     Comments: Postsurgical scars that are well-healed  Genitourinary:    Comments: Erythematous rash with satellite lesions concerning for Candida Skin:    Coloration: Skin is not cyanotic or jaundiced.  Neurological:     General: No focal deficit present.     Mental Status: She is alert.  Psychiatric:        Mood and Affect: Mood normal.        Behavior: Behavior normal.     LABS on Admission: I have personally reviewed all the labs and imaging below    Basic Metabolic Panel: Recent Labs  Lab 05/29/20 0449 05/31/20 2038  NA 135 132*  K 4.1 2.9*  CL 102 96*  CO2 26 23  GLUCOSE 94 158*  BUN 6* 7*  CREATININE 0.73 1.14*  CALCIUM 7.7* 8.4*   Liver Function Tests: Recent Labs  Lab 05/31/20 2038  AST 21  ALT 12  ALKPHOS 58  BILITOT 0.5  PROT 7.1  ALBUMIN 2.6*   Recent Labs  Lab 05/31/20 2038  LIPASE 31   No results for input(s): AMMONIA in the last 168 hours. CBC: Recent Labs  Lab 05/29/20 0449 05/29/20 0449 05/31/20 2038  WBC 6.3  --  14.4*  HGB 9.4*  --  12.0  HCT 28.4*  --  37.8  MCV 93.4   < > 94.3  PLT 251  --  396   < > = values in this interval not displayed.   Cardiac Enzymes: No results for input(s): CKTOTAL, CKMB, CKMBINDEX, TROPONINI in the last 168 hours. BNP: Invalid input(s): POCBNP CBG: Recent Labs  Lab 05/27/20 1610  GLUCAP 92    Radiological Exams on Admission:  DG Abdomen Acute W/Chest  Result Date: 06/01/2020 CLINICAL DATA:  C difficile colitis EXAM: DG ABDOMEN ACUTE W/ 1V CHEST COMPARISON:  Chest x-ray 04/22/2020 FINDINGS: Cardiomegaly and low inspiratory volumes. Atherosclerotic  calcifications are present in  the transverse aorta. No free air on the upright radiograph. Mild gaseous distension of small bowel in the left lower quadrant. Maximal bowel diameter 2.6 cm. Normal colonic gas pattern. No evidence of colonic obstruction. Surgical clips in the right upper quadrant consistent with prior cholecystectomy. Extensive prior thoracolumbosacral fusion surgical changes. IMPRESSION: 1. No acute cardiopulmonary process. 2. Single loop of mildly dilated and air-filled small bowel in the left lower quadrant. Differential considerations include early or partial small bowel obstruction versus local ileus related to an intra-abdominal infectious/inflammatory process. 3. Aortic atherosclerosis. Electronically Signed   By: Jacqulynn Cadet M.D.   On: 06/01/2020 09:52      EKG: Not done in ED   A & P   Principal Problem:   Abdominal pain Active Problems:   Hypertension   Hypercholesteremia   Congestive heart failure (CHF) (HCC)   Peripheral arterial occlusive disease (H[facility])   Hyponatremia   C. difficile colitis   Hypokalemia   1. Generalized abdominal pain with recent history of PAD s/p aortobifemoral bypass (04/18/2020), recently diagnosed ischemic colitis without intervention and C. difficile colitis a. Afebrile with borderline low BP and elevated white count (14 - could be reactive to pain +/- from C diff) b. Was resistant to surgical intervention at last visit but now is a medical c. Abdominal x-ray ordered in ED, results pending d. Continue p.o. Flagyl (was resistant to p.o. vancomycin at last discharge) e. Morphine for severe pain f. General surgery consulted, appreciate recommendations  2. Vaginal candidiasis a. Nystatin  3. C. difficile a. 7/16 C. difficile antigen positive, toxin negative, PCR positive b. Still with diarrhea and leukocytosis now c. Discharged on 7/29 with 5 days additional p.o. Flagyl which she has started but not completed. d. Continue  Flagyl  4. Hyponatremia a. IV fluids  5. Hypokalemia a. Replete  6. AKI a. IV fluids b. Hold Lisinopril  7. PAD s/p aortobifemoral bypass a. Continue Aspirin, statin  8. Hypertension a. Hold lisinopril b. Continue Coreg c. PRN IV metoprolol  9. HFpEF, not in acute decompensation a. Continue coreg b. Holding lisinopril for now due to aki  10. Diabetes a. HA1c 6.9 b. Sliding scale insulin   DVT prophylaxis: heparin   Code Status: Prior  Diet: NPO  Family Communication: Admission, patients condition and plan of care including tests being ordered have been discussed with the patient who indicates understanding and agrees with the plan and Code Status.Disposition Plan: The appropriate patient status for this patient is INPATIENT. Inpatient status is judged to be reasonable and necessary in order to provide the required intensity of service to ensure the patient's safety. The patient's presenting symptoms, physical exam findings, and initial radiographic and laboratory data in the context of their chronic comorbidities is felt to place them at high risk for further clinical deterioration. Furthermore, it is not anticipated that the patient will be medically stable for discharge from the hospital within 2 midnights of admission. The following factors support the patient status of inpatient.   " The patient's presenting symptoms include abdominal pain. " The worrisome physical exam findings include appears uncomfortable. " The initial radiographic and laboratory data are worrisome because of leukocytosis, xray pending. " The chronic co-morbidities include HFpEF, PAD s/p recent surgery, HTN, HLD.   * I certify that at the point of admission it is my clinical judgment that the patient will require inpatient hospital care spanning beyond 2 midnights from the point of admission due to high intensity of service, high risk  for further deterioration and high frequency of surveillance  required.*   Status is: Inpatient  Remains inpatient appropriate because:IV treatments appropriate due to intensity of illness or inability to take PO and Inpatient level of care appropriate due to severity of illness   Dispo: The patient is from: Home              Anticipated d/c is to: tbd              Anticipated d/c date is: 3 days              Patient currently is not medically stable to d/c.   Consultants  . General Surgery  Procedures  . none  Time Spent on Admission: 70 minutes    Harold Hedge, DO [facility] Pager [phone] 06/01/2020, 10:23 AM

## 2020-06-01 NOTE — ED Notes (Signed)
Per lab tech, pt refused 2nd blood culture draw.  This nurse in to speak with patient to verify, pt verbalized refusal for another lab draw for 2nd blood culture.

## 2020-06-01 NOTE — ED Provider Notes (Signed)
[facility] EMERGENCY DEPARTMENT Provider Note   CSN: [id number] Arrival date & time: 05/31/20  1916     History Chief Complaint  Patient presents with  . Abdominal Pain    Tonya Hughes is a 69 y.o. female with history of CHF (last EF 60 to 65% on 04/12/2020), diabetes mellitus, DVT, hypertension, hyperlipidemia, peripheral arterial disease presents for evaluation of ongoing and worsening abdominal pain.  She had a recent admission for what was thought to be ischemic colitis and was discharged on 05/30/2020 2 days ago.  She was also noted to be C. difficile positive and is currently taking oral Flagyl.  During her hospital stay she underwent flexible sigmoidoscopy with GI who noted long smooth stricture involving the left colon suspected to be ischemic in nature.  General surgery was consulted and recommended surgical intervention which she refused initially.  She states that her pain has since worsened since discharge and she is now amenable to surgery.  Reports pain along the midline of the abdomen which radiates all over.  It worsens with movements and activity, "eases off" with rest and laying on her side.  She notes constant nausea but denies vomiting.  She reports persistent diarrhea, more than 10 episodes of brown-yellow stools.  Denies melena or hematochezia.  Reports burning sensation around the rectum due to the persistent diarrhea.  States she has been compliant with her medications.  Also of note, the patient underwent aortobifemoral bypass on 04/18/2020 and during her most recent hospital stay she underwent bilateral groin debridement in the OR with vascular surgery on 05/27/2020.  Reports some discomfort around the wound vacs.  Has not been taking anything for her pain.  Denies fevers.    The history is provided by the patient.       Past Medical History:  Diagnosis Date  . Arthritis    HANDS  . Back pain   . Congestive heart failure (CHF) ([place])   .  Depressive disorder   . Diabetes mellitus without complication ([place])    new, A1c 6.9% 04/2020  . DVT (deep venous thrombosis) ([place])    Right LE s/p back surgery stop date lovenox 09/05/17  . Dyspnea   . Gastritis   . GERD (gastroesophageal reflux disease)   . Hypercholesteremia   . Hypertension   . Pneumonia     Patient Active Problem List   Diagnosis Date Noted  . Abdominal pain 05/17/2020  . Hyponatremia 05/17/2020  . Diarrhea 05/17/2020  . C. difficile colitis 05/17/2020  . Hemorrhagic shock ([place])   . Peripheral arterial occlusive disease ([place]) 04/18/2020  . Shock ([place]) 04/18/2020  . Hypertension   . Hypercholesteremia   . GERD (gastroesophageal reflux disease)   . Depressive disorder   . Congestive heart failure (CHF) ([place])   . Back pain   . Acute deep vein thrombosis (DVT) of proximal vein of right lower extremity ([place]) 08/27/2017  . Closed fracture of lumbar vertebra ([place]) 05/31/2017  . S/P lumbar fusion 04/21/2017  . Lumbar radiculopathy 02/25/2017    Past Surgical History:  Procedure Laterality Date  . ABDOMINAL AORTOGRAM W/LOWER EXTREMITY Bilateral 11/13/2019   Procedure: ABDOMINAL AORTOGRAM W/LOWER EXTREMITY;  Surgeon: Waynetta Sandy, MD;  Location: [place] [facility];  Service: Cardiovascular;  Laterality: Bilateral;  . ABDOMINAL HYSTERECTOMY    . AORTA - BILATERAL FEMORAL ARTERY BYPASS GRAFT N/A 04/18/2020   Procedure: AORTA BIFEMORAL BYPASS;  Surgeon: Waynetta Sandy, MD;  Location: [place];  Service: Vascular;  Laterality: N/A;  . AORTIC ENDARTERECETOMY N/A 04/18/2020   Procedure: abdominal AORTIC ENDARTERECETOMY;  Surgeon: Waynetta Sandy, MD;  Location: [place];  Service: Vascular;  Laterality: N/A;  . APPENDECTOMY    . APPLICATION OF WOUND VAC Bilateral 05/27/2020   Procedure: APPLICATION OF WOUND VAC;  Surgeon: Elam Dutch, MD;  Location: [place];  Service: Vascular;  Laterality: Bilateral;  . BACK SURGERY    . BIOPSY   05/20/2020   Procedure: BIOPSY;  Surgeon: Arta Silence, MD;  Location: [place];  Service: Endoscopy;;  . CERVICAL SPINE SURGERY    . CHOLECYSTECTOMY    . COLONOSCOPY N/A 04/19/2020   Procedure: COLONOSCOPY;  Surgeon: Ronald Lobo, MD;  Location: [facility] ENDOSCOPY;  Service: Endoscopy;  Laterality: N/A;  bedside exam, pt on ventilator  . DIAGNOSTIC LAPAROSCOPY     lap. chole  . ENDARTERECTOMY FEMORAL Right 04/18/2020   Procedure: RIGHT FEMORAL ENDARTERECTOMY;  Surgeon: Waynetta Sandy, MD;  Location: [place];  Service: Vascular;  Laterality: Right;  . FLEXIBLE SIGMOIDOSCOPY N/A 05/20/2020   Procedure: FLEXIBLE SIGMOIDOSCOPY;  Surgeon: Arta Silence, MD;  Location: [facility] ENDOSCOPY;  Service: Endoscopy;  Laterality: N/A;  . LUMBAR SPINE SURGERY    . WOUND DEBRIDEMENT Bilateral 05/27/2020   Procedure: BILATERAL GROIN DEBRIDEMENT;  Surgeon: Elam Dutch, MD;  Location: [facility] OR;  Service: Vascular;  Laterality: Bilateral;     OB History   No obstetric history on file.     Family History  Problem Relation Age of Onset  . Hypertension Father     Social History   Tobacco Use  . Smoking status: Former Smoker    Packs/day: 1.00    Years: 40.00    Pack years: 40.00    Types: Cigarettes  . Smokeless tobacco: Never Used  Vaping Use  . Vaping Use: Never used  Substance Use Topics  . Alcohol use: Not Currently  . Drug use: Never    Home Medications Prior to Admission medications   Medication Sig Start Date End Date Taking? Authorizing Provider  aspirin EC 81 MG tablet Take 81 mg by mouth daily.   Yes [provider]  atorvastatin (LIPITOR) 40 MG tablet Take 1 tablet (40 mg total) by mouth daily. Patient taking differently: Take 40 mg by mouth at bedtime.  03/25/20 06/23/20 Yes Herminio Commons, MD  carvedilol (COREG) 3.125 MG tablet TAKE ONE TABLET BY MOUTH TWICE DAILY Patient taking differently: Take 3.125 mg by mouth 2 (two) times daily.  09/21/19  Yes  Herminio Commons, MD  fexofenadine (ALLEGRA) 180 MG tablet Take 180 mg by mouth daily.   Yes [provider]  ibuprofen (ADVIL) 200 MG tablet Take 600 mg by mouth every 6 (six) hours as needed for headache or mild pain.   Yes [provider]  lisinopril (PRINIVIL,ZESTRIL) 5 MG tablet Take 5 mg by mouth daily.   Yes [provider]  metroNIDAZOLE (FLAGYL) 500 MG tablet Take 1 tablet (500 mg total) by mouth 3 (three) times daily for 5 days. 05/30/20 06/04/20 Yes Donne Hazel, MD  PARoxetine (PAXIL) 20 MG tablet Take 20 mg by mouth daily.   Yes [provider]  potassium chloride SA (K-DUR,KLOR-CON) 20 MEQ tablet TAKE ONE (1) TABLET EACH DAY Patient taking differently: Take 20 mEq by mouth daily.  01/03/19  Yes Herminio Commons, MD  albuterol (PROVENTIL) (2.5 MG/3ML) 0.083% nebulizer solution Take 3 mLs (2.5 mg total) by nebulization 2 (two) times daily. Patient not taking:  Reported on 06/01/2020 04/29/20   Gabriel Earing, PA-C  arformoterol (BROVANA) 15 MCG/2ML NEBU Take 2 mLs (15 mcg total) by nebulization 2 (two) times daily. Patient not taking: Reported on 06/01/2020 04/29/20   Gabriel Earing, PA-C    Allergies    Zanaflex [tizanidine hcl] and Penicillins  Review of Systems   Review of Systems  Constitutional: Negative for chills and fever.  Respiratory: Negative for shortness of breath.   Cardiovascular: Negative for chest pain.  Gastrointestinal: Positive for abdominal pain, diarrhea and nausea. Negative for vomiting.  All other systems reviewed and are negative.   Physical Exam Updated Vital Signs BP (!) 102/43   Pulse 75   Temp 97.7 F (36.5 C) (Oral)   Resp 16   SpO2 96%   Physical Exam Vitals and nursing note reviewed.  Constitutional:      General: She is not in acute distress.    Appearance: She is well-developed.  HENT:     Head: Normocephalic and atraumatic.     Mouth/Throat:     Comments: Dry mucous membranes Eyes:      General:        Right eye: No discharge.        Left eye: No discharge.     Conjunctiva/sclera: Conjunctivae normal.  Neck:     Vascular: No JVD.     Trachea: No tracheal deviation.  Cardiovascular:     Rate and Rhythm: Normal rate.  Pulmonary:     Effort: Pulmonary effort is normal.  Abdominal:     General: Abdomen is flat. A surgical scar is present. Bowel sounds are decreased. There is no distension.     Palpations: Abdomen is soft.     Tenderness: There is generalized abdominal tenderness. There is no right CVA tenderness, left CVA tenderness or rebound.     Comments: Maximally tender to palpation in the left lower quadrant.  Large well-healing midline incision.  Bilateral wound vacs noted to the groin region; no surrounding erythema  Genitourinary:    Comments: Examination performed in the presence of a chaperone.  No frank rectal bleeding.  Erythema noted around the buttocks, sacrum, rectum and thighs.  Patient reports this area is pruritic. Skin:    General: Skin is warm.     Findings: No erythema.  Neurological:     Mental Status: She is alert.  Psychiatric:        Behavior: Behavior normal.     ED Results / Procedures / Treatments   Labs (all labs ordered are listed, but only abnormal results are displayed) Labs Reviewed  COMPREHENSIVE METABOLIC PANEL - Abnormal; Notable for the following components:      Result Value   Sodium 132 (*)    Potassium 2.9 (*)    Chloride 96 (*)    Glucose, Bld 158 (*)    BUN 7 (*)    Creatinine, Ser 1.14 (*)    Calcium 8.4 (*)    Albumin 2.6 (*)    GFR calc non Af Amer 50 (*)    GFR calc Af Amer 58 (*)    All other components within normal limits  CBC - Abnormal; Notable for the following components:   WBC 14.4 (*)    RDW 17.3 (*)    All other components within normal limits  SARS CORONAVIRUS 2 BY RT PCR (HOSPITAL ORDER, [place] [facility])  LIPASE, BLOOD  URINALYSIS, ROUTINE W REFLEX MICROSCOPIC  LACTIC  ACID, PLASMA  LACTIC ACID,  PLASMA    EKG None  Radiology DG Abdomen Acute W/Chest  Result Date: 06/01/2020 CLINICAL DATA:  C difficile colitis EXAM: DG ABDOMEN ACUTE W/ 1V CHEST COMPARISON:  Chest x-ray 04/22/2020 FINDINGS: Cardiomegaly and low inspiratory volumes. Atherosclerotic calcifications are present in the transverse aorta. No free air on the upright radiograph. Mild gaseous distension of small bowel in the left lower quadrant. Maximal bowel diameter 2.6 cm. Normal colonic gas pattern. No evidence of colonic obstruction. Surgical clips in the right upper quadrant consistent with prior cholecystectomy. Extensive prior thoracolumbosacral fusion surgical changes. IMPRESSION: 1. No acute cardiopulmonary process. 2. Single loop of mildly dilated and air-filled small bowel in the left lower quadrant. Differential considerations include early or partial small bowel obstruction versus local ileus related to an intra-abdominal infectious/inflammatory process. 3. Aortic atherosclerosis. Electronically Signed   By: Jacqulynn Cadet M.D.   On: 06/01/2020 09:52    Procedures Procedures (including critical care time)  Medications Ordered in ED Medications  sodium chloride flush (NS) 0.9 % injection 3 mL (has no administration in time range)  fentaNYL (SUBLIMAZE) injection 25 mcg (has no administration in time range)  nystatin cream (MYCOSTATIN) (has no administration in time range)  sodium chloride 0.9 % bolus 1,000 mL (1,000 mLs Intravenous New Bag/Given 06/01/20 0846)  ondansetron (ZOFRAN) injection 4 mg (4 mg Intravenous Given 06/01/20 0846)  fentaNYL (SUBLIMAZE) injection 50 mcg (50 mcg Intravenous Given 06/01/20 0846)    ED Course  I have reviewed the triage vital signs and the nursing notes.  Pertinent labs & imaging results that were available during my care of the patient were reviewed by me and considered in my medical decision making (see chart for details).    MDM  Rules/Calculators/A&P                          Patient returns to the ED for evaluation of worsening abdominal pain.  Recent admission for ischemic colitis and noted to have a colonic stricture for which general surgery recommended surgical intervention which she initially refused.  She states that she is feeling worse and is open to discussing surgery now.  She is afebrile, blood pressures soft.  Clinically she appears dry.   Abdomen is soft with no rebound or guarding.  She exhibits diffuse tenderness to palpation worse along the left side of the abdomen.  Her surgical incisions appear well-healing, wound vacs are in place along with the groin bilaterally.  Some mild skin breakdown and erythema around the buttocks and perineum suggestive of stage I decubitus ulcer versus fungal infection.  Lab work reviewed and interpreted by myself shows no leukocytosis but her hemoglobin and platelet count have also increased suggesting hemoconcentration in the setting of dehydration due to persistent diarrhea.  Her BMP shows new elevation in her creatinine and her she is now hyponatremic and hypokalemic also consistent with persistent diarrhea.  Lipase and LFTs are within normal limits.  CONSULT: Spoke with Saverio Danker, PA with general surgery service.  They will assess the patient emergently in the ED.  She recommends addition of abdominal plain films to rule out perforation.  X-ray shows single loop of mildly dilated and air-filled small bowel in the left lower quadrant suggestive of possible early/partial small bowel obstruction versus local ileus.  Spoke with Dr. Neysa Bonito with [facility] hospitalist service who agrees to assume care of patient and bring her into the hospital for further evaluation and management.  Final Clinical Impression(s) /  ED Diagnoses Final diagnoses:  Generalized abdominal pain  Ischemic colitis (HCC)  C. difficile colitis  Dehydration  AKI (acute kidney injury) ([place])  Hypokalemia     Rx / DC Orders ED Discharge Orders    None       Debroah Baller 06/01/20 1020    Blanchie Dessert, MD 06/01/20 1343

## 2020-06-01 NOTE — ED Notes (Signed)
Attempted to draw labs, pt pulled arm away and told me to stop that I was hurting her and did not want me to try again.  Called Ashleigh in mini lab, she will come draw labs.

## 2020-06-02 DIAGNOSIS — K56609 Unspecified intestinal obstruction, unspecified as to partial versus complete obstruction: Secondary | ICD-10-CM

## 2020-06-02 DIAGNOSIS — N179 Acute kidney failure, unspecified: Secondary | ICD-10-CM

## 2020-06-02 DIAGNOSIS — K559 Vascular disorder of intestine, unspecified: Secondary | ICD-10-CM

## 2020-06-02 LAB — BASIC METABOLIC PANEL
Anion gap: 6 (ref 5–15)
BUN: 9 mg/dL (ref 8–23)
CO2: 27 mmol/L (ref 22–32)
Calcium: 8 mg/dL — ABNORMAL LOW (ref 8.9–10.3)
Chloride: 104 mmol/L (ref 98–111)
Creatinine, Ser: 1.03 mg/dL — ABNORMAL HIGH (ref 0.44–1.00)
GFR calc Af Amer: >60 mL/min (ref >60)
GFR calc non Af Amer: 56 mL/min — ABNORMAL LOW (ref >60)
Glucose, Bld: 91 mg/dL (ref 70–99)
Potassium: 3.7 mmol/L (ref 3.5–5.1)
Sodium: 137 mmol/L (ref 135–145)

## 2020-06-02 LAB — GLUCOSE, CAPILLARY
Glucose-Capillary: 79 mg/dL (ref 70–99)
Glucose-Capillary: 76 mg/dL (ref 70–99)

## 2020-06-02 LAB — CBC
HCT: 29.2 % — ABNORMAL LOW (ref 36.0–46.0)
Hemoglobin: 8.9 g/dL — ABNORMAL LOW (ref 12.0–15.0)
MCH: 29.1 pg (ref 26.0–34.0)
MCHC: 30.5 g/dL (ref 30.0–36.0)
MCV: 95.4 fL (ref 80.0–100.0)
Platelets: 271 10*3/uL (ref 150–400)
RBC: 3.06 MIL/uL — ABNORMAL LOW (ref 3.87–5.11)
RDW: 17.4 % — ABNORMAL HIGH (ref 11.5–15.5)
WBC: 6.1 10*3/uL (ref 4.0–10.5)
nRBC: 0 % (ref 0.0–0.2)

## 2020-06-02 MED ORDER — IPRATROPIUM-ALBUTEROL 0.5-2.5 (3) MG/3ML IN SOLN: 3.0000 mL | Freq: Four times a day (QID) | RESPIRATORY_TRACT | Status: DC | PRN

## 2020-06-02 MED ORDER — ENSURE ENLIVE PO LIQD: 237.0000 mL | Freq: Two times a day (BID) | ORAL | Status: DC

## 2020-06-02 MED ORDER — FENTANYL CITRATE (PF) 100 MCG/2ML IJ SOLN: 50.0000 ug | INTRAMUSCULAR | Status: DC | PRN

## 2020-06-02 MED ORDER — VANCOMYCIN 50 MG/ML ORAL SOLUTION
125.0000 mg | Freq: Three times a day (TID) | ORAL | Status: DC
  Administered 2020-06-02 – 2020-06-05 (×14): 125 mg via ORAL
  Filled 2020-06-02 (×19): qty 2.5

## 2020-06-02 MED ORDER — SODIUM CHLORIDE 0.9 % IV SOLN: INTRAVENOUS | Status: AC

## 2020-06-02 NOTE — Progress Notes (Signed)
[facility] PROGRESS NOTE    Progress Note  Tonya Hughes  LOV:[id number] DOB: 1950/12/04 DOA: 05/31/2020 PCP: Monico Blitz, MD     Brief Narrative:   Tonya Hughes is an 69 y.o. female past medical history of chronic diastolic heart failure, diabetes mellitus DVT essential hypertension status post aortobifemoral bypass on 04/18/2020 recently discharged from the hospital for C. difficile colitis on 05/30/2020 who presents to the hospital with ongoing abdominal pain.  During her recent admission she was transfused 2 units of packed red blood cells due to rectal bleeding GI was consulted who perform a flex sig on 05/20/2020 that showed colonic stricture with pathology consistent with ischemic colitis.  During this time GI recommended surgical consult which the patient refused at that time.  Since discharge she is of been having worsening abdominal pain and now is amenable to surgery.  Assessment/Plan:   Ischemic colitis/sigmoid colonic stricture causing partial small bowel obstruction: Abdominal x-ray done on 06/01/2020 showed partial small bowel obstruction.  Blood cultures have been sent and are pending. Surgery was consulted they recommended surgical intervention they allowed her to have clear liquid diet today. She will need to be optimized medically in order to proceed with surgery. Patient has been very good attitude this morning complaining about her food, pain and whenever they go to do surgery. She relates the narcotics help with the pain but she is not eating that awful food.  History of of colitis: She tested positive for C. difficile on 05/17/2020, still having some diarrhea, Flagyl is not optimal for treatment of C. difficile colitis. We will change him to oral vancomycin. I have asked the nurse to document every single episode of diarrhea she has had.  Vaginal candidiasis: Continue nystatin.  Hypovolemic hyponatremia: We will start her on IV fluids and her hyponatremia  resolved.  Hypokalemia: We will start her on oral repletion of basic metabolic panels pending this morning.  Acute kidney injury: With a baseline creatinine of less than 1, on admission 1.2 lisinopril was held start on gentle IV fluid hydration will likely resolve with IV fluids as seems prerenal her basic metabolic panel is pending this morning.  History of peripheral vascular disease status post aortobifemoral bypass: Continue aspirin and statins.  Essential hypertension: Holding lisinopril due to acute renal failure, continue Coreg her blood pressure is fairly controlled.  Chronic diastolic heart failure: Slightly hypovolemic continue Coreg hold lisinopril.  Diabetes mellitus type 2: With an A1c of 6.9.  Currently diet continue CBGs every 4.  Left heel unstageable sacral decubitus ulcer present on admission: RN Pressure Injury Documentation: Pressure Injury 04/27/20 Heel Left Deep Tissue Pressure Injury - Purple or maroon localized area of discolored intact skin or blood-filled blister due to damage of underlying soft tissue from pressure and/or shear. (Active)  04/27/20 2110  Location: Heel  Location Orientation: Left  Staging: Deep Tissue Pressure Injury - Purple or maroon localized area of discolored intact skin or blood-filled blister due to damage of underlying soft tissue from pressure and/or shear.  Wound Description (Comments):   Present on Admission:     Estimated body mass index is 27.38 kg/m as calculated from the following:   Height as of this encounter: 5' 3"  (1.6 m).   Weight as of this encounter: 70.1 kg.    DVT prophylaxis: lovenox Family Communication:none Status is: Inpatient  Remains inpatient appropriate because:Hemodynamically unstable   Dispo: The patient is from: Home  Anticipated d/c is to: Home              Anticipated d/c date is: > 3 days              Patient currently is not medically stable to d/c.        Code Status:      Code Status Orders  (From admission, onward)         Start     Ordered   06/01/20 1125  Do not attempt resuscitation (DNR)  Continuous       Question Answer Comment  In the event of cardiac or respiratory ARREST Do not call a code blue   In the event of cardiac or respiratory ARREST Do not perform Intubation, CPR, defibrillation or ACLS   In the event of cardiac or respiratory ARREST Use medication by any route, position, wound care, and other measures to relive pain and suffering. May use oxygen, suction and manual treatment of airway obstruction as needed for comfort.      06/01/20 1124        Code Status History    Date Active Date Inactive Code Status Order ID Comments User Context   05/17/2020 2344 05/30/2020 2125 Full Code [id number]  Chauncey Mann, MD ED   04/18/2020 1622 04/29/2020 2116 Full Code [id number]  Garner Nash, DO Inpatient   04/18/2020 1611 04/18/2020 1622 Full Code [id number]  Karoline Caldwell, PA-C Inpatient   11/13/2019 1137 11/13/2019 2011 Full Code [id number]  Waynetta Sandy, MD Inpatient   Advance Care Planning Activity        IV Access:    Peripheral IV   Procedures and diagnostic studies:   DG Abdomen Acute W/Chest  Result Date: 06/01/2020 CLINICAL DATA:  C difficile colitis EXAM: DG ABDOMEN ACUTE W/ 1V CHEST COMPARISON:  Chest x-ray 04/22/2020 FINDINGS: Cardiomegaly and low inspiratory volumes. Atherosclerotic calcifications are present in the transverse aorta. No free air on the upright radiograph. Mild gaseous distension of small bowel in the left lower quadrant. Maximal bowel diameter 2.6 cm. Normal colonic gas pattern. No evidence of colonic obstruction. Surgical clips in the right upper quadrant consistent with prior cholecystectomy. Extensive prior thoracolumbosacral fusion surgical changes. IMPRESSION: 1. No acute cardiopulmonary process. 2. Single loop of mildly dilated and air-filled small bowel in the left lower quadrant.  Differential considerations include early or partial small bowel obstruction versus local ileus related to an intra-abdominal infectious/inflammatory process. 3. Aortic atherosclerosis. Electronically Signed   By: Jacqulynn Cadet M.D.   On: 06/01/2020 09:52     Medical Consultants:    None.  Anti-Infectives:   Oral vancomycin  Subjective:    Tonya Hughes is in a very moody attitude this morning complaining about her food, treatment and whenever they go to do the surgery.  Objective:    Vitals:   06/01/20 1629 06/01/20 1953 06/02/20 0043 06/02/20 0442  BP: (!) 153/44 (!) 151/57 (!) 135/51 (!) 147/54  Pulse: 65 61 56 64  Resp: 18 18 18 18   Temp: 97.9 F (36.6 C) 98.2 F (36.8 C) 98.6 F (37 C) 98.1 F (36.7 C)  TempSrc: Oral Oral Oral Oral  SpO2: 100% 100% 98% 97%  Weight:    70.1 kg  Height:       SpO2: 97 %   Intake/Output Summary (Last 24 hours) at 06/02/2020 0804 Last data filed at 06/02/2020 0500 Gross per 24 hour  Intake 1750 ml  Output 0 ml  Net  1750 ml   Filed Weights   06/01/20 1626 06/02/20 0442  Weight: 69.9 kg 70.1 kg    Exam: General exam: In no acute distress. Respiratory system: Good air movement and clear to auscultation. Cardiovascular system: S1 & S2 heard, RRR. No JVD. Gastrointestinal system: She refused abdominal exam Extremities: No pedal edema. Skin: No rashes, lesions or ulcers Psychiatry: Patient has poor insight into her medical condition and seems to be in a belligerent attitude.   Data Reviewed:    Labs: Basic Metabolic Panel: Recent Labs  Lab 05/27/20 0745 05/27/20 0745 05/29/20 0449 05/29/20 0449 05/31/20 2038 06/01/20 1251  NA 135  --  135  --  132* 136  K 3.5   < > 4.1   < > 2.9* 2.9*  CL 101  --  102  --  96* 102  CO2 26  --  26  --  23 25  GLUCOSE 94  --  94  --  158* 101*  BUN 7*  --  6*  --  7* 9  CREATININE 0.83  --  0.73  --  1.14* 1.23*  CALCIUM 7.8*  --  7.7*  --  8.4* 7.8*  MG  --   --   --   --    --  1.6*   < > = values in this interval not displayed.   GFR Estimated Creatinine Clearance: 41.7 mL/min (A) (by C-G formula based on SCr of 1.23 mg/dL (H)). Liver Function Tests: Recent Labs  Lab 05/31/20 2038  AST 21  ALT 12  ALKPHOS 58  BILITOT 0.5  PROT 7.1  ALBUMIN 2.6*   Recent Labs  Lab 05/31/20 2038  LIPASE 31   No results for input(s): AMMONIA in the last 168 hours. Coagulation profile No results for input(s): INR, PROTIME in the last 168 hours. COVID-19 Labs  No results for input(s): DDIMER, FERRITIN, LDH, CRP in the last 72 hours.  Lab Results  Component Value Date   SARSCOV2NAA NEGATIVE 06/01/2020   [place] NEGATIVE 05/18/2020   [place] NEGATIVE 04/29/2020   [place] NEGATIVE 04/26/2020    CBC: Recent Labs  Lab 05/28/20 0212 05/29/20 0449 05/31/20 2038 06/01/20 1251 06/02/20 0708  WBC 7.4 6.3 14.4* 8.6 6.1  HGB 9.3* 9.4* 12.0 9.7* 8.9*  HCT 29.6* 28.4* 37.8 31.7* 29.2*  MCV 94.6 93.4 94.3 96.6 95.4  PLT 280 251 396 271 271   Cardiac Enzymes: No results for input(s): CKTOTAL, CKMB, CKMBINDEX, TROPONINI in the last 168 hours. BNP (last 3 results) No results for input(s): PROBNP in the last 8760 hours. CBG: Recent Labs  Lab 05/27/20 1610 06/01/20 1132 06/01/20 1631 06/01/20 2112 06/02/20 0532  GLUCAP 92 95 89 85 79   D-Dimer: No results for input(s): DDIMER in the last 72 hours. Hgb A1c: No results for input(s): HGBA1C in the last 72 hours. Lipid Profile: No results for input(s): CHOL, HDL, LDLCALC, TRIG, CHOLHDL, LDLDIRECT in the last 72 hours. Thyroid function studies: No results for input(s): TSH, T4TOTAL, T3FREE, THYROIDAB in the last 72 hours.  Invalid input(s): FREET3 Anemia work up: No results for input(s): VITAMINB12, FOLATE, FERRITIN, TIBC, IRON, RETICCTPCT in the last 72 hours. Sepsis Labs: Recent Labs  Lab 05/29/20 0449 05/31/20 2038 06/01/20 1251 06/01/20 1637 06/02/20 0708  WBC 6.3 14.4* 8.6  --  6.1   LATICACIDVEN  --   --  1.1 0.9  --    Microbiology Recent Results (from the past 240 hour(s))  Surgical pcr screen  Status: None   Collection Time: 05/27/20 12:37 AM   Specimen: Nasal Mucosa; Nasal Swab  Result Value Ref Range Status   MRSA, PCR NEGATIVE NEGATIVE Final   Staphylococcus aureus NEGATIVE NEGATIVE Final    Comment: (NOTE) The Xpert SA Assay (FDA approved for NASAL specimens in patients 58 years of age and older), is one component of a comprehensive surveillance program. It is not intended to diagnose infection nor to guide or monitor treatment. Performed at [facility], [place] [address], [place], [place] [address]   SARS Coronavirus 2 by RT PCR (hospital order, performed in [facility] hospital lab) Nasopharyngeal Nasopharyngeal Swab     Status: None   Collection Time: 06/01/20 12:17 PM   Specimen: Nasopharyngeal Swab  Result Value Ref Range Status   SARS Coronavirus 2 NEGATIVE NEGATIVE Final    Comment: (NOTE) SARS-CoV-2 target nucleic acids are NOT DETECTED.  The SARS-CoV-2 RNA is generally detectable in upper and lower respiratory specimens during the acute phase of infection. The lowest concentration of SARS-CoV-2 viral copies this assay can detect is 250 copies / mL. A negative result does not preclude SARS-CoV-2 infection and should not be used as the sole basis for treatment or other patient management decisions.  A negative result may occur with improper specimen collection / handling, submission of specimen other than nasopharyngeal swab, presence of viral mutation(s) within the areas targeted by this assay, and inadequate number of viral copies (<250 copies / mL). A negative result must be combined with clinical observations, patient history, and epidemiological information.  Fact Sheet for Patients:   StrictlyIdeas.no  Fact Sheet for Healthcare Providers: BankingDealers.co.za  This test is not  yet approved or  cleared by the [place] FDA and has been authorized for detection and/or diagnosis of SARS-CoV-2 by FDA under an Emergency Use Authorization (EUA).  This EUA will remain in effect (meaning this test can be used) for the duration of the COVID-19 declaration under Section 564(b)(1) of the Act, 21 U.S.C. section 360bbb-3(b)(1), unless the authorization is terminated or revoked sooner.  Performed at [facility], [place] [address] [place], [place] [address]      Medications:    aspirin EC  81 mg Oral Daily   atorvastatin  40 mg Oral Daily   carvedilol  3.125 mg Oral BID WC   fentaNYL (SUBLIMAZE) injection  25 mcg Intravenous Once   heparin  5,000 Units Subcutaneous Q8H   insulin aspart  0-6 Units Subcutaneous TID WC   metroNIDAZOLE  500 mg Oral TID   nystatin cream   Topical BID   PARoxetine  20 mg Oral Daily   potassium chloride SA  20 mEq Oral Daily   sodium chloride flush  3 mL Intravenous Once   sodium chloride flush  3 mL Intravenous Q12H   Continuous Infusions:    LOS: 1 day   Charlynne Cousins  [facility]  06/02/2020, 8:04 AM

## 2020-06-02 NOTE — Plan of Care (Signed)
  Problem: Elimination: Goal: Will not experience complications related to urinary retention Outcome: Completed/Met   Problem: Pain Managment: Goal: General experience of comfort will improve Outcome: Completed/Met   Problem: Elimination: Goal: Will not experience complications related to urinary retention Outcome: Completed/Met   Problem: Pain Managment: Goal: General experience of comfort will improve Outcome: Completed/Met

## 2020-06-02 NOTE — Progress Notes (Signed)
Subjective: Patient is made and upset.  She hates her clear liquids.  "You are starving me to death!"  Still with some diarrhea.  Denies nausea.  Admits abdominal pain still present  ROS: See above, otherwise other systems negative  Objective: Vital signs in last 24 hours: Temp:  [97.9 F (36.6 C)-98.6 F (37 C)] 98.1 F (36.7 C) (08/01 0442) Pulse Rate:  [56-69] 64 (08/01 0442) Resp:  [16-18] 18 (08/01 0442) BP: (135-155)/(44-58) 147/54 (08/01 0442) SpO2:  [97 %-100 %] 97 % (08/01 0442) Weight:  [69.9 kg-70.1 kg] 70.1 kg (08/01 0442) Last BM Date: 06/01/20  Intake/Output from previous day: 07/31 0701 - 08/01 0700 In: 1750 [P.O.:300; IV Piggyback:1450] Out: 0  Intake/Output this shift: No intake/output data recorded.  PE: Abd: soft, tender, midline wound is healing  Lab Results:  Recent Labs    06/01/20 1251 06/02/20 0708  WBC 8.6 6.1  HGB 9.7* 8.9*  HCT 31.7* 29.2*  PLT 271 271   BMET Recent Labs    06/01/20 1251 06/02/20 0708  NA 136 137  K 2.9* 3.7  CL 102 104  CO2 25 27  GLUCOSE 101* 91  BUN 9 9  CREATININE 1.23* 1.03*  CALCIUM 7.8* 8.0*   PT/INR No results for input(s): LABPROT, INR in the last 72 hours. CMP     Component Value Date/Time   NA 137 06/02/2020 0708   K 3.7 06/02/2020 0708   CL 104 06/02/2020 0708   CO2 27 06/02/2020 0708   GLUCOSE 91 06/02/2020 0708   BUN 9 06/02/2020 0708   CREATININE 1.03 (H) 06/02/2020 0708   CALCIUM 8.0 (L) 06/02/2020 0708   PROT 7.1 05/31/2020 2038   ALBUMIN 2.6 (L) 05/31/2020 2038   AST 21 05/31/2020 2038   ALT 12 05/31/2020 2038   ALKPHOS 58 05/31/2020 2038   BILITOT 0.5 05/31/2020 2038   GFRNONAA 56 (L) 06/02/2020 0708   GFRAA >60 06/02/2020 0708   Lipase     Component Value Date/Time   LIPASE 31 05/31/2020 2038       Studies/Results: DG Abdomen Acute W/Chest  Result Date: 06/01/2020 CLINICAL DATA:  C difficile colitis EXAM: DG ABDOMEN ACUTE W/ 1V CHEST COMPARISON:  Chest x-ray  04/22/2020 FINDINGS: Cardiomegaly and low inspiratory volumes. Atherosclerotic calcifications are present in the transverse aorta. No free air on the upright radiograph. Mild gaseous distension of small bowel in the left lower quadrant. Maximal bowel diameter 2.6 cm. Normal colonic gas pattern. No evidence of colonic obstruction. Surgical clips in the right upper quadrant consistent with prior cholecystectomy. Extensive prior thoracolumbosacral fusion surgical changes. IMPRESSION: 1. No acute cardiopulmonary process. 2. Single loop of mildly dilated and air-filled small bowel in the left lower quadrant. Differential considerations include early or partial small bowel obstruction versus local ileus related to an intra-abdominal infectious/inflammatory process. 3. Aortic atherosclerosis. Electronically Signed   By: Jacqulynn Cadet M.D.   On: 06/01/2020 09:52    Anti-infectives: Anti-infectives (From admission, onward)   Start     Dose/Rate Route Frequency Ordered Stop   06/02/20 1030  vancomycin (VANCOCIN) 50 mg/mL oral solution 125 mg     Discontinue     125 mg Oral 3 times daily before meals & bedtime 06/02/20 0822 06/12/20 1029   06/01/20 1130  metroNIDAZOLE (FLAGYL) tablet 500 mg  Status:  Discontinued        500 mg Oral 3 times daily 06/01/20 1124 06/02/20 0350  Assessment/Plan PAD chronic diastolic heart failure CKD stage II HTN T2DM HLD Chronic back pain Depression 1 month s/p aortobifem bypass c diff colitis  Sigmoid colon stricture secondary to ischemic colitis -patient readmitted with more abdominal pain.  Still having diarrhea -will give full liquids today and nutritional shakes. -patient is only 1 month out from a laparotomy.  She will likely need this stricture resected at some point moving forward, but this would be high risk for complications and difficult only 1 month out from recent laparotomy. -recommendations are for full liquids and shakes to keep her nutrition  up and in time plan for resection.  This may still be 1-2 months away unless her stricture becomes so significant that she loses bowel function. -I tried to explain this to the patient but she is angry and cussing and yelling at me.  I told her I was going to step out to let her calm down. -discussed her with Dr. Olevia Bowens.  FEN - FLD, shakes VTE - heparin ID - vanc for c diff   LOS: 1 day    Henreitta Cea , [facility] Surgery 06/02/2020, 8:43 AM Please see Amion for pager number during day hours 7:00am-4:30pm or 7:00am -11:30am on weekends

## 2020-06-03 LAB — GLUCOSE, CAPILLARY: Glucose-Capillary: 75 mg/dL (ref 70–99)

## 2020-06-03 MED ORDER — CARVEDILOL 3.125 MG PO TABS: 3.1250 mg | ORAL_TABLET | Freq: Two times a day (BID) | ORAL | Status: DC

## 2020-06-03 MED ORDER — LISINOPRIL 5 MG PO TABS
5.0000 mg | ORAL_TABLET | Freq: Every day | ORAL | Status: DC
  Administered 2020-06-04 – 2020-06-09 (×5): 5 mg via ORAL
  Filled 2020-06-03 (×6): qty 1

## 2020-06-03 NOTE — Progress Notes (Signed)
[facility] PROGRESS NOTE    Progress Note  Tonya Hughes  OVZ:[id number] DOB: [birth year] DOA: 05/31/2020 PCP: Monico Blitz, MD     Brief Narrative:   Tonya Hughes is an 69 y.o. female past medical history of chronic diastolic heart failure, diabetes mellitus DVT essential hypertension status post aortobifemoral bypass on 04/18/2020 recently discharged from the hospital for C. difficile colitis on 05/30/2020 who presents to the hospital with ongoing abdominal pain.  During her recent admission she was transfused 2 units of packed red blood cells due to rectal bleeding GI was consulted who perform a flex sig on 05/20/2020 that showed colonic stricture with pathology consistent with ischemic colitis.  During this time GI recommended surgical consult which the patient refused at that time.  Since discharge she is of been having worsening abdominal pain and now is amenable to surgery.  Assessment/Plan:   Ischemic colitis/sigmoid colonic stricture causing partial small bowel obstruction: Abdominal x-ray done on 06/01/2020 showed partial small bowel obstruction.   Blood cultures have been sent and are have been negative till date Surgery was consulted they recommended surgical intervention once she is stabilized, they allowed her to have full liquid diet plus Ensure.  Surgery will be 1 to 2 months unless stricture leads to to loss of bowel function, anesthesia has had a recent aorto bifemoral bypass on 04/18/2020. She will need to be optimized medically in order to proceed with surgery. Patient relates that they should do the surgery now so she can eat and and I cannot do the surgery now than I should get out of her room.  History of of colitis: She tested positive for C. difficile on 05/17/2020, still having some diarrhea, Flagyl is not optimal for treatment of C. difficile colitis.  She was having diarrhea on admission as she continues to have diarrhea. She was being noncompliant with her  Flagyl as an outpatient. She was started on oral vancomycin and will continue total treatment of 10 days.  Vaginal candidiasis: Continue nystatin.  Hypovolemic hyponatremia: Resolved with IV fluid hydration.  Hypokalemia: We will start her on oral repletion of basic metabolic panels pending this morning.  Acute kidney injury: With a baseline creatinine of less than 1, on admission 1.2. Antihypertensive medications were held she was fluid resuscitated and her creatinine returned to baseline.  History of peripheral vascular disease status post aortobifemoral bypass: Continue aspirin and statins.  Essential hypertension: lisinopril due to acute renal failure, continue Coreg her blood pressure is fairly controlled.  Chronic diastolic heart failure: Her hypokalemia has resolved we will restart her Coreg and ACE inhibitor.  Diabetes mellitus type 2: With an A1c of 6.9.  Currently diet continue CBGs every 4.  Left heel unstageable sacral decubitus ulcer present on admission: RN Pressure Injury Documentation: Pressure Injury 04/27/20 Heel Left Deep Tissue Pressure Injury - Purple or maroon localized area of discolored intact skin or blood-filled blister due to damage of underlying soft tissue from pressure and/or shear. (Active)  04/27/20 2110  Location: Heel  Location Orientation: Left  Staging: Deep Tissue Pressure Injury - Purple or maroon localized area of discolored intact skin or blood-filled blister due to damage of underlying soft tissue from pressure and/or shear.  Wound Description (Comments):   Present on Admission:     Estimated body mass index is 27.69 kg/m as calculated from the following:   Height as of this encounter: 5' 3"  (1.6 m).   Weight as of this encounter: 70.9 kg.  DVT prophylaxis: lovenox Family Communication:none Status is: Inpatient  Remains inpatient appropriate because:Hemodynamically unstable   Dispo: The patient is from: Home               Anticipated d/c is to: Home              Anticipated d/c date is: > 3 days              Patient currently is not medically stable to d/c.    Code Status:     Code Status Orders  (From admission, onward)         Start     Ordered   06/01/20 1125  Do not attempt resuscitation (DNR)  Continuous       Question Answer Comment  In the event of cardiac or respiratory ARREST Do not call a "code blue"   In the event of cardiac or respiratory ARREST Do not perform Intubation, CPR, defibrillation or ACLS   In the event of cardiac or respiratory ARREST Use medication by any route, position, wound care, and other measures to relive pain and suffering. May use oxygen, suction and manual treatment of airway obstruction as needed for comfort.      06/01/20 1124        Code Status History    Date Active Date Inactive Code Status Order ID Comments User Context   05/17/2020 2344 05/30/2020 2125 Full Code [id number]  Chauncey Mann, MD ED   04/18/2020 1622 04/29/2020 2116 Full Code [id number]  Garner Nash, DO Inpatient   04/18/2020 1611 04/18/2020 1622 Full Code [id number]  Marval Regal Inpatient   11/13/2019 1137 11/13/2019 2011 Full Code [id number]  Waynetta Sandy, MD Inpatient   Advance Care Planning Activity        IV Access:    Peripheral IV   Procedures and diagnostic studies:   No results found.   Medical Consultants:    None.  Anti-Infectives:   Oral vancomycin  Subjective:    Tonya Hughes she was very emotional this morning screaming and being offensive, saying that she wanted to eat and everyone agreed to do the surgery just to get out of the room.  Objective:    Vitals:   06/02/20 2027 06/03/20 0542 06/03/20 0932 06/03/20 0933  BP: 125/66 (!) 153/39 (!) 141/129 (!) 136/94  Pulse: 63 64 66 62  Resp: 18 20    Temp: 97.7 F (36.5 C) 98 F (36.7 C)    TempSrc: Oral Oral    SpO2: 97% 99% 99% 100%  Weight:  70.9 kg    Height:       SpO2:  100 %   Intake/Output Summary (Last 24 hours) at 06/03/2020 0945 Last data filed at 06/03/2020 0600 Gross per 24 hour  Intake 1493.78 ml  Output 2 ml  Net 1491.78 ml   Filed Weights   06/01/20 1626 06/02/20 0442 06/03/20 0542  Weight: 69.9 kg 70.1 kg 70.9 kg    Exam: General exam: In no acute distress. Respiratory system: Good air movement and clear to auscultation. Cardiovascular system: S1 & S2 heard, RRR. No JVD. Gastrointestinal system: Abdomen is nondistended, soft and nontender.  Extremities: No pedal edema. Skin: No rashes, lesions or ulcers Psychiatry: Poor insight and judgment and medical condition.   Data Reviewed:    Labs: Basic Metabolic Panel: Recent Labs  Lab 05/29/20 0449 05/29/20 0449 05/31/20 2038 05/31/20 2038 06/01/20 1251 06/02/20 0708  NA 135  --  132*  --  136 137  K 4.1   < > 2.9*   < > 2.9* 3.7  CL 102  --  96*  --  102 104  CO2 26  --  23  --  25 27  GLUCOSE 94  --  158*  --  101* 91  BUN 6*  --  7*  --  9 9  CREATININE 0.73  --  1.14*  --  1.23* 1.03*  CALCIUM 7.7*  --  8.4*  --  7.8* 8.0*  MG  --   --   --   --  1.6*  --    < > = values in this interval not displayed.   GFR Estimated Creatinine Clearance: 50 mL/min (A) (by C-G formula based on SCr of 1.03 mg/dL (H)). Liver Function Tests: Recent Labs  Lab 05/31/20 2038  AST 21  ALT 12  ALKPHOS 58  BILITOT 0.5  PROT 7.1  ALBUMIN 2.6*   Recent Labs  Lab 05/31/20 2038  LIPASE 31   No results for input(s): AMMONIA in the last 168 hours. Coagulation profile No results for input(s): INR, PROTIME in the last 168 hours. COVID-19 Labs  No results for input(s): DDIMER, FERRITIN, LDH, CRP in the last 72 hours.  Lab Results  Component Value Date   SARSCOV2NAA NEGATIVE 06/01/2020   [place] NEGATIVE 05/18/2020   [place] NEGATIVE 04/29/2020   [place] NEGATIVE 04/26/2020    CBC: Recent Labs  Lab 05/28/20 0212 05/29/20 0449 05/31/20 2038 06/01/20 1251  06/02/20 0708  WBC 7.4 6.3 14.4* 8.6 6.1  HGB 9.3* 9.4* 12.0 9.7* 8.9*  HCT 29.6* 28.4* 37.8 31.7* 29.2*  MCV 94.6 93.4 94.3 96.6 95.4  PLT 280 251 396 271 271   Cardiac Enzymes: No results for input(s): CKTOTAL, CKMB, CKMBINDEX, TROPONINI in the last 168 hours. BNP (last 3 results) No results for input(s): PROBNP in the last 8760 hours. CBG: Recent Labs  Lab 06/01/20 1631 06/01/20 2112 06/02/20 0532 06/02/20 2329 06/03/20 0552  GLUCAP 89 85 79 76 75   D-Dimer: No results for input(s): DDIMER in the last 72 hours. Hgb A1c: No results for input(s): HGBA1C in the last 72 hours. Lipid Profile: No results for input(s): CHOL, HDL, LDLCALC, TRIG, CHOLHDL, LDLDIRECT in the last 72 hours. Thyroid function studies: No results for input(s): TSH, T4TOTAL, T3FREE, THYROIDAB in the last 72 hours.  Invalid input(s): FREET3 Anemia work up: No results for input(s): VITAMINB12, FOLATE, FERRITIN, TIBC, IRON, RETICCTPCT in the last 72 hours. Sepsis Labs: Recent Labs  Lab 05/29/20 0449 05/31/20 2038 06/01/20 1251 06/01/20 1637 06/02/20 0708  WBC 6.3 14.4* 8.6  --  6.1  LATICACIDVEN  --   --  1.1 0.9  --    Microbiology Recent Results (from the past 240 hour(s))  Surgical pcr screen     Status: None   Collection Time: 05/27/20 12:37 AM   Specimen: Nasal Mucosa; Nasal Swab  Result Value Ref Range Status   MRSA, PCR NEGATIVE NEGATIVE Final   Staphylococcus aureus NEGATIVE NEGATIVE Final    Comment: (NOTE) The Xpert SA Assay (FDA approved for NASAL specimens in patients 77 years of age and older), is one component of a comprehensive surveillance program. It is not intended to diagnose infection nor to guide or monitor treatment. Performed at [facility], [place] [address], [place], [place] [address]   SARS Coronavirus 2 by RT PCR (hospital order, performed in [facility] hospital lab) Nasopharyngeal Nasopharyngeal Swab  Status: None   Collection Time: 06/01/20 12:17 PM    Specimen: Nasopharyngeal Swab  Result Value Ref Range Status   SARS Coronavirus 2 NEGATIVE NEGATIVE Final    Comment: (NOTE) SARS-CoV-2 target nucleic acids are NOT DETECTED.  The SARS-CoV-2 RNA is generally detectable in upper and lower respiratory specimens during the acute phase of infection. The lowest concentration of SARS-CoV-2 viral copies this assay can detect is 250 copies / mL. A negative result does not preclude SARS-CoV-2 infection and should not be used as the sole basis for treatment or other patient management decisions.  A negative result may occur with improper specimen collection / handling, submission of specimen other than nasopharyngeal swab, presence of viral mutation(s) within the areas targeted by this assay, and inadequate number of viral copies (<250 copies / mL). A negative result must be combined with clinical observations, patient history, and epidemiological information.  Fact Sheet for Patients:   StrictlyIdeas.no  Fact Sheet for Healthcare Providers: BankingDealers.co.za  This test is not yet approved or  cleared by the [place] FDA and has been authorized for detection and/or diagnosis of SARS-CoV-2 by FDA under an Emergency Use Authorization (EUA).  This EUA will remain in effect (meaning this test can be used) for the duration of the COVID-19 declaration under Section 564(b)(1) of the Act, 21 U.S.C. section 360bbb-3(b)(1), unless the authorization is terminated or revoked sooner.  Performed at [facility], [place] [address], [place], [place] [address]   Culture, blood (routine x 2)     Status: None (Preliminary result)   Collection Time: 06/01/20 12:51 PM   Specimen: BLOOD  Result Value Ref Range Status   Specimen Description BLOOD RIGHT ANTECUBITAL  Final   Special Requests   Final    BOTTLES DRAWN AEROBIC AND ANAEROBIC Blood Culture adequate volume   Culture   Final    NO GROWTH < 24  HOURS Performed at [facility], [place] [address], [place], [place] [address]    Report Status PENDING  Incomplete  Culture, blood (routine x 2)     Status: None (Preliminary result)   Collection Time: 06/01/20  4:37 PM   Specimen: BLOOD  Result Value Ref Range Status   Specimen Description BLOOD RIGHT ANTECUBITAL  Final   Special Requests   Final    BOTTLES DRAWN AEROBIC ONLY Blood Culture adequate volume   Culture   Final    NO GROWTH < 24 HOURS Performed at [facility], [place] [address] [place], [place] [address]    Report Status PENDING  Incomplete     Medications:   . aspirin EC  81 mg Oral Daily  . atorvastatin  40 mg Oral Daily  . carvedilol  3.125 mg Oral BID WC  . feeding supplement (ENSURE ENLIVE)  237 mL Oral BID BM  . heparin  5,000 Units Subcutaneous Q8H  . insulin aspart  0-6 Units Subcutaneous TID WC  . nystatin cream   Topical BID  . PARoxetine  20 mg Oral Daily  . potassium chloride SA  20 mEq Oral Daily  . sodium chloride flush  3 mL Intravenous Once  . sodium chloride flush  3 mL Intravenous Q12H  . vancomycin  125 mg Oral TID AC & HS   Continuous Infusions:    LOS: 2 days   Charlynne Cousins  [facility]  06/03/2020, 9:45 AM

## 2020-06-03 NOTE — Progress Notes (Signed)
Patient refused all morning medications and stated that she is not going to take any medications until she has real foods to eat.  Notified Dr Olevia Bowens MD

## 2020-06-03 NOTE — Progress Notes (Signed)
Patient refused BG check.

## 2020-06-03 NOTE — Progress Notes (Signed)
Subjective: Tonya Hughes  States her abdominal discomfort is the same as yesterday- no better, no worse. States when providers examine her belly it "doesn't feel good", she is unable to describe her discomfort. Denies nausea, emesis, or belching. Denies flatus - says she hasn't had flatus for weeks. Continues to have liquid BMs. Does not like full liquids - doesn't like grtis or oatmeal or ensure/    ROS: See above, otherwise other systems negative  Objective: Vital signs in last 24 hours: Temp:  [97.4 F (36.3 C)-98 F (36.7 C)] 98 F (36.7 C) (08/02 0542) Pulse Rate:  [63-66] 64 (08/02 0542) Resp:  [18-20] 20 (08/02 0542) BP: (125-158)/(39-66) 153/39 (08/02 0542) SpO2:  [96 %-99 %] 99 % (08/02 0542) Weight:  [70.9 kg] 70.9 kg (08/02 0542) Last BM Date: 06/03/20 (diarrhea, c-diff positive )  Intake/Output from previous day: 08/01 0701 - 08/02 0700 In: 1733.8 [P.O.:1180; I.V.:553.8] Out: 2 [Stool:2] Intake/Output this shift: No intake/output data recorded.  PE: Abd: soft, tender without rebound or guarding, midline wound is healing  Lab Results:  Recent Labs    06/01/20 1251 06/02/20 0708  WBC 8.6 6.1  HGB 9.7* 8.9*  HCT 31.7* 29.2*  PLT 271 271   BMET Recent Labs    06/01/20 1251 06/02/20 0708  NA 136 137  K 2.9* 3.7  CL 102 104  CO2 25 27  GLUCOSE 101* 91  BUN 9 9  CREATININE 1.23* 1.03*  CALCIUM 7.8* 8.0*   CMP     Component Value Date/Time   NA 137 06/02/2020 0708   K 3.7 06/02/2020 0708   CL 104 06/02/2020 0708   CO2 27 06/02/2020 0708   GLUCOSE 91 06/02/2020 0708   BUN 9 06/02/2020 0708   CREATININE 1.03 (H) 06/02/2020 0708   CALCIUM 8.0 (L) 06/02/2020 0708   PROT 7.1 05/31/2020 2038   ALBUMIN 2.6 (L) 05/31/2020 2038   AST 21 05/31/2020 2038   ALT 12 05/31/2020 2038   ALKPHOS 58 05/31/2020 2038   BILITOT 0.5 05/31/2020 2038   GFRNONAA 56 (L) 06/02/2020 0708   GFRAA >60 06/02/2020 0708   Lipase     Component Value Date/Time    LIPASE 31 05/31/2020 2038   Studies/Results: DG Abdomen Acute W/Chest  Result Date: 06/01/2020 CLINICAL DATA:  C difficile colitis EXAM: DG ABDOMEN ACUTE W/ 1V CHEST COMPARISON:  Chest x-ray 04/22/2020 FINDINGS: Cardiomegaly and low inspiratory volumes. Atherosclerotic calcifications are present in the transverse aorta. No free air on the upright radiograph. Mild gaseous distension of small bowel in the left lower quadrant. Maximal bowel diameter 2.6 cm. Normal colonic gas pattern. No evidence of colonic obstruction. Surgical clips in the right upper quadrant consistent with prior cholecystectomy. Extensive prior thoracolumbosacral fusion surgical changes. IMPRESSION: 1. No acute cardiopulmonary process. 2. Single loop of mildly dilated and air-filled small bowel in the left lower quadrant. Differential considerations include early or partial small bowel obstruction versus local ileus related to an intra-abdominal infectious/inflammatory process. 3. Aortic atherosclerosis. Electronically Signed   By: Jacqulynn Cadet M.D.   On: 06/01/2020 09:52    Anti-infectives: Anti-infectives (From admission, onward)   Start     Dose/Rate Route Frequency Ordered Stop   06/02/20 1030  vancomycin (VANCOCIN) 50 mg/mL oral solution 125 mg     Discontinue     125 mg Oral 3 times daily before meals & bedtime 06/02/20 0822 06/12/20 1029   06/01/20 1130  metroNIDAZOLE (FLAGYL) tablet 500 mg  Status:  Discontinued        500 mg Oral 3 times daily 06/01/20 1124 06/02/20 4166       Assessment/Plan PAD chronic diastolic heart failure CKD stage II HTN T2DM HLD Chronic back pain Depression 1 month s/p aortobifem bypass c diff colitis  Sigmoid colon stricture secondary to ischemic colitis -patient readmitted with more abdominal pain.  Still having diarrhea. No flatus. - barium enema demonstrates long, smooth stricture from mid descending colon to the rectum - I do not think patient would tolerate a solid diet  given above findings and worse pain with previous PO intake. Continue full liquids today and nutritional shakes. If she does not take in enough protein/calories this way then will need to re-attempt SOFT diet vs start TNA.  -patient is only 1 month out from a laparotomy.  She will likely need this stricture resected at some point moving forward, but this would be high risk for complications and difficult only 1 month out from recent laparotomy. -recommendations are for full liquids and shakes to keep her nutrition up and in time plan for resection.  This may still be 1-2 months away unless her stricture becomes so significant that she loses bowel function.   FEN - FLD, shakes VTE - heparin ID - vanc for c diff   LOS: 2 days     Jill Alexanders , [facility] Surgery 06/03/2020, 8:33 AM Please see Amion for pager number during day hours 7:00am-4:30pm or 7:00am -11:30am on weekends

## 2020-06-04 ENCOUNTER — Inpatient Hospital Stay: Payer: Self-pay

## 2020-06-04 LAB — GLUCOSE, CAPILLARY
Glucose-Capillary: 81 mg/dL (ref 70–99)
Glucose-Capillary: 81 mg/dL (ref 70–99)
Glucose-Capillary: 153 mg/dL — ABNORMAL HIGH (ref 70–99)

## 2020-06-04 LAB — PREALBUMIN: Prealbumin: 10.2 mg/dL — ABNORMAL LOW (ref 18–38)

## 2020-06-04 MED ORDER — INSULIN ASPART 100 UNIT/ML ~~LOC~~ SOLN
0.0000 [IU] | SUBCUTANEOUS | Status: DC
  Administered 2020-06-04 – 2020-06-05 (×2): 1 [IU] via SUBCUTANEOUS
  Administered 2020-06-06 – 2020-06-07 (×3): 4 [IU] via SUBCUTANEOUS
  Administered 2020-06-07: 2 [IU] via SUBCUTANEOUS

## 2020-06-04 MED ORDER — CHLORHEXIDINE GLUCONATE CLOTH 2 % EX PADS
6.0000 | MEDICATED_PAD | Freq: Every day | CUTANEOUS | Status: DC
  Administered 2020-06-05 – 2020-06-14 (×8): 6 via TOPICAL

## 2020-06-04 MED ORDER — SODIUM CHLORIDE 0.9% FLUSH
10.0000 mL | INTRAVENOUS | Status: DC | PRN
  Administered 2020-06-04: 10 mL

## 2020-06-04 MED ORDER — SODIUM CHLORIDE 0.9% FLUSH
10.0000 mL | Freq: Two times a day (BID) | INTRAVENOUS | Status: DC
  Administered 2020-06-05 – 2020-06-14 (×7): 10 mL

## 2020-06-04 MED ORDER — BOOST / RESOURCE BREEZE PO LIQD CUSTOM: 1.0000 | Freq: Three times a day (TID) | ORAL | Status: DC

## 2020-06-04 MED ORDER — TRAVASOL 10 % IV SOLN
INTRAVENOUS | Status: AC
  Filled 2020-06-04: qty 422.4

## 2020-06-04 NOTE — Progress Notes (Signed)
Peripherally Inserted Central Catheter Placement  The IV Nurse has discussed with the patient and/or persons authorized to consent for the patient, the purpose of this procedure and the potential benefits and risks involved with this procedure.  The benefits include less needle sticks, lab draws from the catheter, and the patient may be discharged home with the catheter. Risks include, but not limited to, infection, bleeding, blood clot (thrombus formation), and puncture of an artery; nerve damage and irregular heartbeat and possibility to perform a PICC exchange if needed/ordered by physician.  Alternatives to this procedure were also discussed.  Bard Power PICC patient education guide, fact sheet on infection prevention and patient information card has been provided to patient /or left at bedside.    PICC Placement Documentation  PICC Double Lumen 82/06/01 PICC Right Basilic 40 cm 0 cm (Active)  Indication for Insertion or Continuance of Line Administration of hyperosmolar/irritating solutions (i.e. TPN, Vancomycin, etc.) 06/04/20 1700  Exposed Catheter (cm) 0 cm 06/04/20 1700  Site Assessment Clean;Dry;Intact 06/04/20 1700  Lumen #1 Status Flushed;Saline locked;Blood return noted 06/04/20 1700  Lumen #2 Status Flushed;Saline locked;Blood return noted 06/04/20 1700  Dressing Type Transparent 06/04/20 1700  Dressing Status Clean;Dry;Intact 06/04/20 1700  Dressing Intervention New dressing 06/04/20 1700  Dressing Change Due 06/11/20 06/04/20 [place], Nicolette Bang 06/04/2020, 5:01 PM

## 2020-06-04 NOTE — Progress Notes (Signed)
PHARMACY - TOTAL PARENTERAL NUTRITION CONSULT NOTE   Indication: Small bowel obstruction and Malnutrition  Patient Measurements: Height: 5' 3"  (160 cm) Weight: 69.5 kg (153 lb 3.5 oz) IBW/kg (Calculated) : 52.4 TPN AdjBW (KG): 56.8 Body mass index is 27.14 kg/m.  Assessment: 11 YOF readmitted with abdominal pain and found to have sigmoid colonic stricture causing partial small bowel obstruction. Per surgery team, patient is taking almost no full liquids and only drinking juice. A soft diet has been ordered but surgery thinks that based on patient's history, she will not be able to tolerate solid foods. Pre-albumin today is low at 10.2.   Glucose / Insulin: A1c 6.9,  Glucose 81 on vsSSI. No insulin requirements in last 24 hours  Electrolytes: Last Bmet was on 8/1. All electrolytes were wnl.  Renal: SCr 1.03.  LFTs / TGs: LFTs wnl, Tbili wnl  Prealbumin / albumin: Pre-albumin 10.2, Alb 2.6 Intake / Output; MIVF:  GI Imaging: Surgeries / Procedures:   Central access:  TPN start date:   Nutritional Goals (Preliminary - Awaiting RD recommendations): kCal: 2085, Protein: 83g, Fluid:  Goal TPN rate is 80 mL/hr (provides 84 g of protein and 1969 kcals per day)  Current Nutrition:  Soft diet - advanced today   Plan:  Start TPN at 59m/hr at 1800 Electrolytes in TPN: 766m/L of Na, 5093mL of K, 5mE85m of Ca, 5mEq44mof Mg, and 15mmo21mof Phos. Cl:Ac ratio 1:1 Add standard MVI and trace elements to TPN Switch to Q4h very sensitive SSI and adjust as needed  Monitor TPN labs on Mon/Thurs  Monitor tolerance of soft diet  F/u dietician recommendations    BenjamAlbertina ParrmD., BCPS, BCCCP Clinical Pharmacist Clinical phone for 06/04/20 until 3:30pm: [phone] 3:30pm, please refer to AMION [facility]-specific pharmacist

## 2020-06-04 NOTE — Progress Notes (Signed)
[facility] PROGRESS NOTE    Progress Note  Tonya Hughes  DDU:[id number] DOB: 08-02-1951 DOA: 05/31/2020 PCP: Monico Blitz, MD     Brief Narrative:   Tonya Hughes is an 69 y.o. female past medical history of chronic diastolic heart failure, diabetes mellitus DVT essential hypertension status post aortobifemoral bypass on 04/18/2020 recently discharged from the hospital for C. difficile colitis on 05/30/2020 who presents to the hospital with ongoing abdominal pain.  During her recent admission she was transfused 2 units of packed red blood cells due to rectal bleeding GI was consulted who perform a flex sig on 05/20/2020 that showed colonic stricture with pathology consistent with ischemic colitis.  During this time GI recommended surgical consult which the patient refused at that time.  Since discharge she is of been having worsening abdominal pain and diarrhea, she is now amenable to surgery.  Assessment/Plan:   Readmitted for abdominal pain due to sigmoid colonic stricture causing partial small bowel obstruction: - Abdominal x-ray done on 06/01/2020 showed partial small bowel obstruction.  Barium enema demonstrated a long smooth stricture from the descending colon to the rectum on her previous admission. - Blood cultures have been sent and are have been negative till date - Surgery was consulted they recommended surgical intervention once she is stabilized, they allowed her to have soft diet and the patient was counseled about taking small amounts and chewing her food well.  - Surgery will be 1 to 2 months unless stricture leads to to loss of bowel function, it is to note she has had a recent aorto bifemoral bypass on 04/18/2020. - Proceed with PICC line placement and TPN, her TPN is trending down this morning is 10.2. -Patient relates that they should do the surgery now, so she can eat and and I cannot do the surgery now than I should get out of her room.  History of of C. Dif.  colitis: She tested positive for C. difficile on 05/17/2020, still having some diarrhea, Flagyl is not optimal for treatment of C. difficile colitis.   She was being noncompliant with her Flagyl as an outpatient. She was started on oral vancomycin and will continue total treatment of 10 days. She continues to have diarrhea, has improved since we started vancomycin. She is probably being noncompliant with her Flagyl at home.  Vaginal candidiasis: Continue nystatin.  Hypovolemic hyponatremia: Resolved with IV fluid hydration.  Hypokalemia: We will start her on oral repletion of basic metabolic panels pending this morning.  Acute kidney injury: With a baseline creatinine of less than 1, on admission 1.2. Antihypertensive medications were held she was fluid resuscitated and her creatinine returned to baseline.  History of peripheral vascular disease status post aortobifemoral bypass: Continue aspirin and statins.  Essential hypertension: lisinopril due to acute renal failure, continue Coreg her blood pressure is fairly controlled.  Chronic diastolic heart failure: Her hypokalemia has resolved we will restart her Coreg and ACE inhibitor.  Diabetes mellitus type 2: With an A1c of 6.9.  Currently diet continue CBGs every 4.  Left heel unstageable sacral decubitus ulcer present on admission: RN Pressure Injury Documentation: Pressure Injury 04/27/20 Heel Left Deep Tissue Pressure Injury - Purple or maroon localized area of discolored intact skin or blood-filled blister due to damage of underlying soft tissue from pressure and/or shear. (Active)  04/27/20 2110  Location: Heel  Location Orientation: Left  Staging: Deep Tissue Pressure Injury - Purple or maroon localized area of discolored intact skin or blood-filled blister  due to damage of underlying soft tissue from pressure and/or shear.  Wound Description (Comments):   Present on Admission:     Estimated body mass index is 27.14  kg/m as calculated from the following:   Height as of this encounter: 5' 3"  (1.6 m).   Weight as of this encounter: 69.5 kg.    DVT prophylaxis: lovenox Family Communication:none Status is: Inpatient  Remains inpatient appropriate because:Hemodynamically unstable   Dispo: The patient is from: Home              Anticipated d/c is to: Home              Anticipated d/c date is: > 3 days              Patient currently is not medically stable to d/c.    Code Status:     Code Status Orders  (From admission, onward)         Start     Ordered   06/01/20 1125  Do not attempt resuscitation (DNR)  Continuous       Question Answer Comment  In the event of cardiac or respiratory ARREST Do not call a "code blue"   In the event of cardiac or respiratory ARREST Do not perform Intubation, CPR, defibrillation or ACLS   In the event of cardiac or respiratory ARREST Use medication by any route, position, wound care, and other measures to relive pain and suffering. May use oxygen, suction and manual treatment of airway obstruction as needed for comfort.      06/01/20 1124        Code Status History    Date Active Date Inactive Code Status Order ID Comments User Context   05/17/2020 2344 05/30/2020 2125 Full Code [id number]  Chauncey Mann, MD ED   04/18/2020 1622 04/29/2020 2116 Full Code [id number]  Garner Nash, DO Inpatient   04/18/2020 1611 04/18/2020 1622 Full Code [id number]  Marval Regal Inpatient   11/13/2019 1137 11/13/2019 2011 Full Code [id number]  Waynetta Sandy, MD Inpatient   Advance Care Planning Activity        IV Access:    Peripheral IV   Procedures and diagnostic studies:   No results found.   Medical Consultants:    None.  Anti-Infectives:   Oral vancomycin  Subjective:    Tonya Hughes continues to be offensive angry and with a foul mouth  Objective:    Vitals:   06/03/20 2044 06/04/20 0616 06/04/20 0644 06/04/20 0908   BP: (!) 145/62 (!) 153/53  (!) 164/64  Pulse: (!) 57 70  67  Resp: 18 18    Temp: 98.5 F (36.9 C) 98.2 F (36.8 C)    TempSrc: Oral Oral    SpO2: 97% 95%  98%  Weight:   69.5 kg   Height:       SpO2: 98 %   Intake/Output Summary (Last 24 hours) at 06/04/2020 0956 Last data filed at 06/03/2020 1500 Gross per 24 hour  Intake 220 ml  Output --  Net 220 ml   Filed Weights   06/02/20 0442 06/03/20 0542 06/04/20 0644  Weight: 70.1 kg 70.9 kg 69.5 kg    Exam: General exam: In no acute distress. Respiratory system: Good air movement and clear to auscultation. Cardiovascular system: S1 & S2 heard, RRR. No JVD. Gastrointestinal system: Refused Extremities: No pedal edema. Skin: No rashes, lesions or ulcers Psychiatry: Poor judgment and insight into medical condition.  Her attitude is belligerent   Data Reviewed:    Labs: Basic Metabolic Panel: Recent Labs  Lab 05/29/20 0449 05/29/20 0449 05/31/20 2038 05/31/20 2038 06/01/20 1251 06/02/20 0708  NA 135  --  132*  --  136 137  K 4.1   < > 2.9*   < > 2.9* 3.7  CL 102  --  96*  --  102 104  CO2 26  --  23  --  25 27  GLUCOSE 94  --  158*  --  101* 91  BUN 6*  --  7*  --  9 9  CREATININE 0.73  --  1.14*  --  1.23* 1.03*  CALCIUM 7.7*  --  8.4*  --  7.8* 8.0*  MG  --   --   --   --  1.6*  --    < > = values in this interval not displayed.   GFR Estimated Creatinine Clearance: 49.5 mL/min (A) (by C-G formula based on SCr of 1.03 mg/dL (H)). Liver Function Tests: Recent Labs  Lab 05/31/20 2038  AST 21  ALT 12  ALKPHOS 58  BILITOT 0.5  PROT 7.1  ALBUMIN 2.6*   Recent Labs  Lab 05/31/20 2038  LIPASE 31   No results for input(s): AMMONIA in the last 168 hours. Coagulation profile No results for input(s): INR, PROTIME in the last 168 hours. COVID-19 Labs  No results for input(s): DDIMER, FERRITIN, LDH, CRP in the last 72 hours.  Lab Results  Component Value Date   SARSCOV2NAA NEGATIVE 06/01/2020    SARSCOV2NAA NEGATIVE 05/18/2020   [place] NEGATIVE 04/29/2020   Micro NEGATIVE 04/26/2020    CBC: Recent Labs  Lab 05/29/20 0449 05/31/20 2038 06/01/20 1251 06/02/20 0708  WBC 6.3 14.4* 8.6 6.1  HGB 9.4* 12.0 9.7* 8.9*  HCT 28.4* 37.8 31.7* 29.2*  MCV 93.4 94.3 96.6 95.4  PLT 251 396 271 271   Cardiac Enzymes: No results for input(s): CKTOTAL, CKMB, CKMBINDEX, TROPONINI in the last 168 hours. BNP (last 3 results) No results for input(s): PROBNP in the last 8760 hours. CBG: Recent Labs  Lab 06/01/20 2112 06/02/20 0532 06/02/20 2329 06/03/20 0552 06/04/20 0617  GLUCAP 85 79 76 75 81   D-Dimer: No results for input(s): DDIMER in the last 72 hours. Hgb A1c: No results for input(s): HGBA1C in the last 72 hours. Lipid Profile: No results for input(s): CHOL, HDL, LDLCALC, TRIG, CHOLHDL, LDLDIRECT in the last 72 hours. Thyroid function studies: No results for input(s): TSH, T4TOTAL, T3FREE, THYROIDAB in the last 72 hours.  Invalid input(s): FREET3 Anemia work up: No results for input(s): VITAMINB12, FOLATE, FERRITIN, TIBC, IRON, RETICCTPCT in the last 72 hours. Sepsis Labs: Recent Labs  Lab 05/29/20 0449 05/31/20 2038 06/01/20 1251 06/01/20 1637 06/02/20 0708  WBC 6.3 14.4* 8.6  --  6.1  LATICACIDVEN  --   --  1.1 0.9  --    Microbiology Recent Results (from the past 240 hour(s))  Surgical pcr screen     Status: None   Collection Time: 05/27/20 12:37 AM   Specimen: Nasal Mucosa; Nasal Swab  Result Value Ref Range Status   MRSA, PCR NEGATIVE NEGATIVE Final   Staphylococcus aureus NEGATIVE NEGATIVE Final    Comment: (NOTE) The Xpert SA Assay (FDA approved for NASAL specimens in patients 13 years of age and older), is one component of a comprehensive surveillance program. It is not intended to diagnose infection nor to guide or monitor treatment. Performed at  [facility], [place] [address], [place], [place] [address]   SARS Coronavirus 2 by RT  PCR (hospital order, performed in [facility] hospital lab) Nasopharyngeal Nasopharyngeal Swab     Status: None   Collection Time: 06/01/20 12:17 PM   Specimen: Nasopharyngeal Swab  Result Value Ref Range Status   SARS Coronavirus 2 NEGATIVE NEGATIVE Final    Comment: (NOTE) SARS-CoV-2 target nucleic acids are NOT DETECTED.  The SARS-CoV-2 RNA is generally detectable in upper and lower respiratory specimens during the acute phase of infection. The lowest concentration of SARS-CoV-2 viral copies this assay can detect is 250 copies / mL. A negative result does not preclude SARS-CoV-2 infection and should not be used as the sole basis for treatment or other patient management decisions.  A negative result may occur with improper specimen collection / handling, submission of specimen other than nasopharyngeal swab, presence of viral mutation(s) within the areas targeted by this assay, and inadequate number of viral copies (<250 copies / mL). A negative result must be combined with clinical observations, patient history, and epidemiological information.  Fact Sheet for Patients:   StrictlyIdeas.no  Fact Sheet for Healthcare Providers: BankingDealers.co.za  This test is not yet approved or  cleared by the [place] FDA and has been authorized for detection and/or diagnosis of SARS-CoV-2 by FDA under an Emergency Use Authorization (EUA).  This EUA will remain in effect (meaning this test can be used) for the duration of the COVID-19 declaration under Section 564(b)(1) of the Act, 21 U.S.C. section 360bbb-3(b)(1), unless the authorization is terminated or revoked sooner.  Performed at [facility], [place] [address] [place], [place] [address]   Culture, blood (routine x 2)     Status: None (Preliminary result)   Collection Time: 06/01/20 12:51 PM   Specimen: BLOOD  Result Value Ref Range Status   Specimen Description BLOOD RIGHT  ANTECUBITAL  Final   Special Requests   Final    BOTTLES DRAWN AEROBIC AND ANAEROBIC Blood Culture adequate volume   Culture   Final    NO GROWTH 2 DAYS Performed at [facility], [place] [address], [place], [place] [address]    Report Status PENDING  Incomplete  Culture, blood (routine x 2)     Status: None (Preliminary result)   Collection Time: 06/01/20  4:37 PM   Specimen: BLOOD  Result Value Ref Range Status   Specimen Description BLOOD RIGHT ANTECUBITAL  Final   Special Requests   Final    BOTTLES DRAWN AEROBIC ONLY Blood Culture adequate volume   Culture   Final    NO GROWTH 2 DAYS Performed at [facility], [place] [address], [place], [place] [address]    Report Status PENDING  Incomplete     Medications:   . aspirin EC  81 mg Oral Daily  . atorvastatin  40 mg Oral Daily  . carvedilol  3.125 mg Oral BID WC  . feeding supplement (ENSURE ENLIVE)  237 mL Oral BID BM  . heparin  5,000 Units Subcutaneous Q8H  . insulin aspart  0-6 Units Subcutaneous TID WC  . lisinopril  5 mg Oral Daily  . nystatin cream   Topical BID  . PARoxetine  20 mg Oral Daily  . potassium chloride SA  20 mEq Oral Daily  . sodium chloride flush  3 mL Intravenous Once  . sodium chloride flush  3 mL Intravenous Q12H  . vancomycin  125 mg Oral TID  AC & HS   Continuous Infusions:    LOS: 3 days   Charlynne Cousins  [facility]  06/04/2020, 9:56 AM

## 2020-06-04 NOTE — Progress Notes (Signed)
Initial Nutrition Assessment  RD working remotely.  DOCUMENTATION CODES:   Not applicable  INTERVENTION:   -D/c Ensure Enlive, due to poor acceptance -Boost Breeze po TID, each supplement provides 250 kcal and 9 grams of protein -TPN management per pharmacy  NUTRITION DIAGNOSIS:   Increased nutrient needs related to post-op healing as evidenced by estimated needs.  GOAL:   Patient will meet greater than or equal to 90% of their needs  MONITOR:   PO intake, Supplement acceptance, Labs, Weight trends, Skin, I & O's  REASON FOR ASSESSMENT:   Consult New TPN/TNA  ASSESSMENT:   This is a 69 year old female with past medical history of HFpEF (last EF 60 to 65% on June 11) diabetes, DVT, hypertension, hyperlipidemia, PAD s/p aortobifemoral bypass (04/18/2020) who presented for evaluation of ongoing worsening abdominal pain  Pt admitted with generalized abdominal pain with history of recently PAD s/p aortobifemoral bypass and recently diagnosed ischemic colitis without intervention.   Reviewed I/O's: +440 ml x 24 hours and +3.9 L since admission   Attempted to speak with pt via hospital room phone, however, unavailable at time of attempted contact ("user busy").   Per H&P, pt was recently admitted the hospital last month; flex sig on 05/20/20 revealed colonic stricture with pathology consistent with ischemic colitis. Pt refused surgery during that admission.   Per general surgery notes, plan for possible colon resection with colostomy tomorrow if pt and husband agreeable.   Pt has been advanced to soft diet per her request, however, doubtful that pt will tolerate solid food per MD. Plan to initiate TPN today; TPN will be initiated at 40 ml/hr at 1800 (provides 1009 kcals and 42 grams protein, meeting  Pt has experienced a 9.9% wt loss over the past 3 months, which is significant for time frame.   RD suspect pt with malnutrition, however, unable to identify at this time.    Labs reviewed: CBG: 75-81 (inpatient orders for glycemic control are 0-6 units insulin aspart every 4 hours).  Diet Order:   Diet Order            DIET SOFT Room service appropriate? Yes; Fluid consistency: Thin  Diet effective now                 EDUCATION NEEDS:   No education needs have been identified at this time  Skin:  Skin Assessment: Skin Integrity Issues: Skin Integrity Issues:: DTI, Incisions, Other (Comment) DTI: lt heel Incisions: rt groin, lt groin, abdomen Other: MASD rt buttocks  Last BM:  06/04/20  Height:   Ht Readings from Last 1 Encounters:  06/01/20 5' 3"  (1.6 m)    Weight:   Wt Readings from Last 1 Encounters:  06/04/20 69.5 kg    Ideal Body Weight:  52.3 kg  BMI:  Body mass index is 27.14 kg/m.  Estimated Nutritional Needs:   Kcal:  1900-2100  Protein:  90-105 grams  Fluid:  > 1.9 L    Loistine Chance, RD, LDN, [place] Registered Dietitian II Certified Diabetes Care and Education Specialist Please refer to [facility] for RD and/or RD on-call/weekend/after hours pager

## 2020-06-04 NOTE — Progress Notes (Addendum)
Subjective: Tonya Hughes  States her abdominal discomfort is the same as yesterday- no better, no worse. Wants to eat solid food. Does not like ensure, yogurt, or eggs. Denies nausea or emesis. Wants surgery if it is necessary to relieve her obstruction - we discussed why this is not recommended based on recent laparotomy.   ROS: See above, otherwise other systems negative  Objective: Vital signs in last 24 hours: Temp:  [97.8 F (36.6 C)-98.5 F (36.9 C)] 98.2 F (36.8 C) (08/03 0616) Pulse Rate:  [57-70] 70 (08/03 0616) Resp:  [18] 18 (08/03 0616) BP: (136-153)/(49-129) 153/53 (08/03 0616) SpO2:  [95 %-100 %] 95 % (08/03 0616) Weight:  [69.5 kg] 69.5 kg (08/03 0644) Last BM Date: 06/03/20  Intake/Output from previous day: 08/02 0701 - 08/03 0700 In: 440 [P.O.:440] Out: -  Intake/Output this shift: No intake/output data recorded.  PE: Abd: soft, tender without rebound or guarding, midline wound is healing  Lab Results:  Recent Labs    06/01/20 1251 06/02/20 0708  WBC 8.6 6.1  HGB 9.7* 8.9*  HCT 31.7* 29.2*  PLT 271 271   BMET Recent Labs    06/01/20 1251 06/02/20 0708  NA 136 137  K 2.9* 3.7  CL 102 104  CO2 25 27  GLUCOSE 101* 91  BUN 9 9  CREATININE 1.23* 1.03*  CALCIUM 7.8* 8.0*   CMP     Component Value Date/Time   NA 137 06/02/2020 0708   K 3.7 06/02/2020 0708   CL 104 06/02/2020 0708   CO2 27 06/02/2020 0708   GLUCOSE 91 06/02/2020 0708   BUN 9 06/02/2020 0708   CREATININE 1.03 (H) 06/02/2020 0708   CALCIUM 8.0 (L) 06/02/2020 0708   PROT 7.1 05/31/2020 2038   ALBUMIN 2.6 (L) 05/31/2020 2038   AST 21 05/31/2020 2038   ALT 12 05/31/2020 2038   ALKPHOS 58 05/31/2020 2038   BILITOT 0.5 05/31/2020 2038   GFRNONAA 56 (L) 06/02/2020 0708   GFRAA >60 06/02/2020 0708   Lipase     Component Value Date/Time   LIPASE 31 05/31/2020 2038   Studies/Results: No results found.  Anti-infectives: Anti-infectives (From admission, onward)    Start     Dose/Rate Route Frequency Ordered Stop   06/02/20 1030  vancomycin (VANCOCIN) 50 mg/mL oral solution 125 mg     Discontinue     125 mg Oral 3 times daily before meals & bedtime 06/02/20 0822 06/12/20 1029   06/01/20 1130  metroNIDAZOLE (FLAGYL) tablet 500 mg  Status:  Discontinued        500 mg Oral 3 times daily 06/01/20 1124 06/02/20 4128       Assessment/Plan PAD chronic diastolic heart failure CKD stage II HTN T2DM HLD Chronic back pain Depression 1 month s/p aortobifem bypass c diff colitis  Sigmoid colon stricture secondary to ischemic colitis -patient readmitted with more abdominal pain.  Still having diarrhea. No flatus. - barium enema demonstrates long, smooth stricture from mid descending colon to the rectum - I have concerns based on patients clinical history and above imaging that she will not tolerate solid foods very well, however she is taking in almost no full liquids and just drinking juice. I will order SOFT diet - she was tolerating small amounts of this last week. Discussed with patient that she should chew her food well, eat small amounts at a time, and avoid tough meats. I also think this patient should be started on TPN.  Prealbumin 10.8 less than a week ago, repeat pending today. -patient is now over 6 weeks  out from a laparotomy. Will discuss consideration of Hartmann procedure or diverting colostomy with Dr. Redmond Pulling. FEN - SOFT, shakes, start TPN VTE - heparin ID - vanc for c diff   LOS: 3 days     Jill Alexanders , [facility] 06/04/2020, 9:05 AM Please see Amion for pager number during day hours 7:00am-4:30pm or 7:00am -11:30am on weekends

## 2020-06-04 NOTE — Evaluation (Signed)
Physical Therapy Evaluation Patient Details Name: Tonya Hughes MRN: [id number] DOB: 09-02-1951 Today's Date: 06/04/2020   History of Present Illness  Tonya Hughes is an 69 y.o. female past medical history of chronic diastolic heart failure, diabetes mellitus DVT essential hypertension status post aortobifemoral bypass on 04/18/2020 recently discharged from the hospital for C. difficile colitis on 05/30/2020 who presents to the hospital with ongoing abdominal pain.  During her recent admission she was transfused 2 units of packed red blood cells due to rectal bleeding GI was consulted who perform a flex sig on 05/20/2020 that showed colonic stricture with pathology consistent with ischemic colitis. Pt needs colostomy and plans to proceed with surgery tomorrow.    Clinical Impression  Pt admitted with above diagnosis. Bed level eval as pt very upset on arrival as the MD just told her she has to have colostomy tomorrow.  Pt tearful yet did do a few exercises.  "I just want to die, " pt stated as she states she doesn't want to have a bag to take care of.  PT listened and provided support to pt.  Will follow acutely and progress and make recommendations as pt needs.   Pt currently with functional limitations due to the deficits listed below (see PT Problem List). Pt will benefit from skilled PT to increase their independence and safety with mobility to allow discharge to the venue listed below.      Follow Up Recommendations SNF;Supervision/Assistance - 24 hour    Equipment Recommendations  Other (comment) (TBA)    Recommendations for Other Services       Precautions / Restrictions Precautions Precautions: Fall Precaution Comments: pressure wound on left heel, c-diff diarrhea, Bilat wounds to groin with wound vac Restrictions Weight Bearing Restrictions: No      Mobility  Bed Mobility               General bed mobility comments: Rolled back and forth in bed crying due to plan for  surgery tomorrow. Pt tearful and would only agree to bed level eval.   Transfers                 General transfer comment: TBA  Ambulation/Gait             General Gait Details: TBA  Stairs            Wheelchair Mobility    Modified Rankin (Stroke Patients Only)       Balance                                             Pertinent Vitals/Pain Pain Assessment: Faces Faces Pain Scale: Hurts even more Pain Location: back Pain Descriptors / Indicators: Sore Pain Intervention(s): Limited activity within patient's tolerance;Monitored during session;Repositioned    Home Living Family/patient expects to be discharged to:: Private residence Living Arrangements: Spouse/significant other Available Help at Discharge: Family;Available PRN/intermittently Type of Home: House Home Access: Stairs to enter Entrance Stairs-Rails: Psychiatric nurse of Steps: 3 Home Layout: One level Home Equipment: Shower seat;Bedside commode;Walker - 4 wheels;Wheelchair - manual      Prior Function Level of Independence: Independent with assistive device(s)               Hand Dominance   Dominant Hand: Right    Extremity/Trunk Assessment   Upper Extremity Assessment Upper Extremity Assessment: Defer  to OT evaluation    Lower Extremity Assessment Lower Extremity Assessment: Generalized weakness    Cervical / Trunk Assessment Cervical / Trunk Assessment: Kyphotic  Communication   Communication: No difficulties  Cognition Arousal/Alertness: Awake/alert Behavior During Therapy: Flat affect;Agitated Overall Cognitive Status: Impaired/Different from baseline Area of Impairment: Memory;Awareness                 Orientation Level: Time Current Attention Level: Sustained Memory: Decreased short-term memory Following Commands: Follows one step commands with increased time Safety/Judgement: Decreased awareness of  deficits;Decreased awareness of safety Awareness: Intellectual Problem Solving: Slow processing;Decreased initiation;Requires verbal cues;Requires tactile cues General Comments: Pt becomes irritated/upset very easily      General Comments      Exercises General Exercises - Upper Extremity Shoulder Flexion: AROM;Both;5 reps;Supine Elbow Flexion: AROM;Both;10 reps;Supine General Exercises - Lower Extremity Ankle Circles/Pumps: AROM;Both;10 reps;Supine Quad Sets: AROM;Both;10 reps;Supine Heel Slides: AROM;Both;10 reps;Supine;Limitations   Assessment/Plan    PT Assessment Patient needs continued PT services  PT Problem List Decreased strength;Decreased mobility;Decreased safety awareness;Decreased coordination;Decreased activity tolerance;Decreased cognition;Decreased balance;Decreased knowledge of use of DME       PT Treatment Interventions DME instruction;Therapeutic exercise;Gait training;Balance training;Functional mobility training;Cognitive remediation;Neuromuscular re-education;Therapeutic activities;Patient/family education    PT Goals (Current goals can be found in the Care Plan section)  Acute Rehab PT Goals Patient Stated Goal: to go home PT Goal Formulation: With patient Time For Goal Achievement: 06/18/20 Potential to Achieve Goals: Fair    Frequency Min 2X/week   Barriers to discharge Decreased caregiver support      Co-evaluation               AM-PAC PT "6 Clicks" Mobility  Outcome Measure Help needed turning from your back to your side while in a flat bed without using bedrails?: Total Help needed moving from lying on your back to sitting on the side of a flat bed without using bedrails?: Total Help needed moving to and from a bed to a chair (including a wheelchair)?: Total Help needed standing up from a chair using your arms (e.g., wheelchair or bedside chair)?: Total Help needed to walk in hospital room?: Total Help needed climbing 3-5 steps with a  railing? : Total 6 Click Score: 6    End of Session   Activity Tolerance: Patient limited by fatigue (self limiting) Patient left: in bed;with call bell/phone within reach Nurse Communication: Mobility status PT Visit Diagnosis: Other abnormalities of gait and mobility (R26.89);Difficulty in walking, not elsewhere classified (R26.2);Muscle weakness (generalized) (M62.81);Other symptoms and signs involving the nervous system (R29.898);Pain Pain - Right/Left: Left Pain - part of body: Leg    Time: 4888-9169 PT Time Calculation (min) (ACUTE ONLY): 14 min   Charges:   PT Evaluation $PT Eval Moderate Complexity: 1 Mod          Rachel Rison W,PT Acute Rehabilitation Services Pager:  [phone]  Office:  [phone]    Denice Paradise 06/04/2020, 3:29 PM

## 2020-06-04 NOTE — Progress Notes (Signed)
Patient refused BG check

## 2020-06-05 DIAGNOSIS — E440 Moderate protein-calorie malnutrition: Secondary | ICD-10-CM | POA: Insufficient documentation

## 2020-06-05 LAB — DIFFERENTIAL
Abs Immature Granulocytes: 0.02 10*3/uL (ref 0.00–0.07)
Basophils Absolute: 0 10*3/uL (ref 0.0–0.1)
Basophils Relative: 1 %
Eosinophils Absolute: 0.2 10*3/uL (ref 0.0–0.5)
Eosinophils Relative: 3 %
Immature Granulocytes: 0 %
Lymphocytes Relative: 36 %
Lymphs Abs: 2.1 10*3/uL (ref 0.7–4.0)
Monocytes Absolute: 0.4 10*3/uL (ref 0.1–1.0)
Monocytes Relative: 6 %
Neutro Abs: 3.2 10*3/uL (ref 1.7–7.7)
Neutrophils Relative %: 54 %

## 2020-06-05 LAB — COMPREHENSIVE METABOLIC PANEL
ALT: 8 U/L (ref 0–44)
AST: 13 U/L — ABNORMAL LOW (ref 15–41)
Albumin: 2 g/dL — ABNORMAL LOW (ref 3.5–5.0)
Alkaline Phosphatase: 39 U/L (ref 38–126)
Anion gap: 8 (ref 5–15)
BUN: 6 mg/dL — ABNORMAL LOW (ref 8–23)
CO2: 28 mmol/L (ref 22–32)
Calcium: 8.1 mg/dL — ABNORMAL LOW (ref 8.9–10.3)
Chloride: 101 mmol/L (ref 98–111)
Creatinine, Ser: 0.7 mg/dL (ref 0.44–1.00)
GFR calc Af Amer: >60 mL/min (ref >60)
GFR calc non Af Amer: >60 mL/min (ref >60)
Glucose, Bld: 138 mg/dL — ABNORMAL HIGH (ref 70–99)
Potassium: 3.5 mmol/L (ref 3.5–5.1)
Sodium: 137 mmol/L (ref 135–145)
Total Bilirubin: 0.4 mg/dL (ref 0.3–1.2)
Total Protein: 5.5 g/dL — ABNORMAL LOW (ref 6.5–8.1)

## 2020-06-05 LAB — TRIGLYCERIDES: Triglycerides: 113 mg/dL (ref <150)

## 2020-06-05 LAB — CBC
HCT: 30.1 % — ABNORMAL LOW (ref 36.0–46.0)
Hemoglobin: 9.6 g/dL — ABNORMAL LOW (ref 12.0–15.0)
MCH: 30.4 pg (ref 26.0–34.0)
MCHC: 31.9 g/dL (ref 30.0–36.0)
MCV: 95.3 fL (ref 80.0–100.0)
Platelets: 245 10*3/uL (ref 150–400)
RBC: 3.16 MIL/uL — ABNORMAL LOW (ref 3.87–5.11)
RDW: 16.5 % — ABNORMAL HIGH (ref 11.5–15.5)
WBC: 5.9 10*3/uL (ref 4.0–10.5)
nRBC: 0 % (ref 0.0–0.2)

## 2020-06-05 LAB — MAGNESIUM: Magnesium: 1.6 mg/dL — ABNORMAL LOW (ref 1.7–2.4)

## 2020-06-05 LAB — PREALBUMIN: Prealbumin: 10.2 mg/dL — ABNORMAL LOW (ref 18–38)

## 2020-06-05 LAB — GLUCOSE, CAPILLARY
Glucose-Capillary: 122 mg/dL — ABNORMAL HIGH (ref 70–99)
Glucose-Capillary: 129 mg/dL — ABNORMAL HIGH (ref 70–99)
Glucose-Capillary: 143 mg/dL — ABNORMAL HIGH (ref 70–99)
Glucose-Capillary: 147 mg/dL — ABNORMAL HIGH (ref 70–99)
Glucose-Capillary: 167 mg/dL — ABNORMAL HIGH (ref 70–99)

## 2020-06-05 LAB — PHOSPHORUS: Phosphorus: 3.4 mg/dL (ref 2.5–4.6)

## 2020-06-05 MED ORDER — TRAVASOL 10 % IV SOLN
INTRAVENOUS | Status: DC
  Filled 2020-06-05: qty 960

## 2020-06-05 MED ORDER — POTASSIUM CHLORIDE CRYS ER 20 MEQ PO TBCR
20.0000 meq | EXTENDED_RELEASE_TABLET | Freq: Once | ORAL | Status: AC
  Administered 2020-06-05: 20 meq via ORAL
  Filled 2020-06-05: qty 1

## 2020-06-05 MED ORDER — SODIUM CHLORIDE 0.9 % IV SOLN
2.0000 g | INTRAVENOUS | Status: AC
  Administered 2020-06-06: 2 g via INTRAVENOUS
  Filled 2020-06-05: qty 2

## 2020-06-05 MED ORDER — TRAVASOL 10 % IV SOLN
INTRAVENOUS | Status: AC
  Filled 2020-06-05: qty 960

## 2020-06-05 MED ORDER — SODIUM CHLORIDE 0.9 % IV SOLN
INTRAVENOUS | Status: DC | PRN
  Administered 2020-06-05 – 2020-06-09 (×2): 250 mL via INTRAVENOUS

## 2020-06-05 MED ORDER — MAGNESIUM SULFATE 2 GM/50ML IV SOLN
2.0000 g | Freq: Once | INTRAVENOUS | Status: AC
  Administered 2020-06-05: 2 g via INTRAVENOUS
  Filled 2020-06-05: qty 50

## 2020-06-05 NOTE — Consult Note (Addendum)
[place] Nurse Consult Note: Reason for Consult: Application of NPWT (VAC) therapy.  Was discharged home and has returned  Home unit in place.  Hospital unit ordered.  Wound type:Surgical aortobifemoral bypass sites in bilateral groin sites.  Pressure Injury POA: NA Measurement: LEft 3 cm x 2 cm  X0.5 cm  Right 2 cm x 3 cm x 0.5 cm  Wound OZD:GUYQI red andbleeds with cleansing Drainage (amount, consistency, odor) moderate bleeding Periwound: Barrier ring used to each site to facilitate seal.  Dressing integrity was lost upon my arrival and erythema to periwound from presumed moisture.  Was cleansed and dried and skin prep applied.  Wounds filled with black foam, 1/2 thickness and covered with drape.  Y connector to both wounds and connected to hospital unit.  Dressing procedure/placement/frequency:CLeanse wounds to bilateral graft sites.   Fill left and right inguinal wounds with black foam, cut in 1/2 thickness.  Y connector to both sites.   Cover with drape.  Change MOn/Wed/Fri.    [place] Nurse requested for preoperative stoma site marking  Discussed surgical procedure and stoma creation with patient.  She is angry and non receptive to a lot of instruction.   Explained role of the [place] nurse team.  Provided the patient with educational booklet and provided samples of pouching options.  Answered patient questions.   Examined patient lying in order to place the marking in the patient's visual field, away from any creases or abdominal contour issues and within the rectus muscle. Patient refused to sit or stand.    Marked for colostomy in the LLQ  4 cm to the left of the umbilicus and 3 cm above AND below the umbilicus.  Due to patient refusal to change position.   Marked for ileostomy in the RLQ  4 cm to the right of the umbilicus and  3 cm above the umbilicus.   Patient's abdomen cleansed with CHG wipes at site markings, allowed to air dry prior to marking.Covered mark with thin film transparent  dressing to preserve mark until date of surgery.   [place] Nurse team will follow up with patient after surgery for continue ostomy care and teaching.   Will follow.  Domenic Moras MSN, RN, FNP-BC CWON Wound, Ostomy, Continence Nurse Pager [phone]

## 2020-06-05 NOTE — Plan of Care (Signed)
°  Problem: Education: °Goal: Ability to demonstrate management of disease process will improve °Outcome: Progressing °Goal: Ability to verbalize understanding of medication therapies will improve °Outcome: Progressing °Goal: Individualized Educational Video(s) °Outcome: Progressing °  °

## 2020-06-05 NOTE — Progress Notes (Signed)
Pt Right Groining is leaking  Will order materials and consult wound care to change out.

## 2020-06-05 NOTE — Progress Notes (Signed)
PHARMACY - TOTAL PARENTERAL NUTRITION CONSULT NOTE   Indication: Small bowel obstruction and Malnutrition  Patient Measurements: Height: 5' 3"  (160 cm) Weight: 70.4 kg (155 lb 3.3 oz) IBW/kg (Calculated) : 52.4 TPN AdjBW (KG): 56.8 Body mass index is 27.49 kg/m.  Assessment: 44 YOF readmitted with abdominal pain and found to have sigmoid colonic stricture causing partial small bowel obstruction. Per surgery team, patient is taking almost no full liquids and only drinking juice. A soft diet has been ordered but surgery thinks that based on patient's history, she will not be able to tolerate solid foods. Pre-albumin today is low at 10.2.   Glucose / Insulin: A1c 6.9,  Glucose 129-153 on vsSSI. 1u insulin in last 24 hours  Electrolytes: K 3.5, Mg 1.6, CO2 up 28, other lytes wnl, currently on KCl 20 mEq oral daily  Renal: SCr normalized   LFTs / TGs: LFTs wnl, Tbili wnl  Prealbumin / albumin: Pre-albumin 10.2, Alb 2 Intake / Output; MIVF:  GI Imaging: Surgeries / Procedures:   Central access: PICC 8/3>> TPN start date: 8/3   Nutritional Goals (Per RD recommendations on 8/3): kCal: 1900-2100, Protein: 90-105g, Fluid:  Goal TPN rate is 80 mL/hr (provides 96 g of protein and 2066 kcals per day)  Current Nutrition:  Soft diet - advanced 8/3 Boost TID - refused all doses   Plan:  Advance TPN to goal rate of 74m/hr at 1800 Electrolytes in TPN: 776m/L of Na, Decrease to 4057mL of K, 5mE56m of Ca, 5mEq38mof Mg, and 15mmo52mof Phos. Change Cl:Ac ratio to 2:1 Add standard MVI and trace elements to TPN Switch to Q4h very sensitive SSI and adjust as needed  Monitor TPN labs on Mon/Thurs  Monitor tolerance of soft diet    BenjamAlbertina ParrmD., BCPS, BCCCP Clinical Pharmacist Clinical phone for 06/05/20 until 3:30pm: [phone] 3:30pm, please refer to [facility] pharmacist

## 2020-06-05 NOTE — Progress Notes (Signed)
Nutrition Follow-up  DOCUMENTATION CODES:   Non-severe (moderate) malnutrition in context of chronic illness  INTERVENTION:   -D/c Boost Breeze -TPN management per pharmacy  NUTRITION DIAGNOSIS:   Moderate Malnutrition related to chronic illness (ischemic colitis) as evidenced by energy intake < or equal to 75% for > or equal to 1 month, percent weight loss, mild fat depletion, moderate fat depletion, mild muscle depletion, moderate muscle depletion.  Ongoing  GOAL:   Patient will meet greater than or equal to 90% of their needs  Progressing   MONITOR:   PO intake, Diet advancement, Labs, Weight trends, Skin, I & O's  REASON FOR ASSESSMENT:   Consult New TPN/TNA  ASSESSMENT:   This is a 69 year old female with past medical history of HFpEF (last EF 60 to 65% on June 11) diabetes, DVT, hypertension, hyperlipidemia, PAD s/p aortobifemoral bypass (04/18/2020) who presented for evaluation of ongoing worsening abdominal pain  Reviewed I/O's: +563 ml x 24 hours and +4.5 L since admission  UOP: 300 ml x 24 hours  Pt sleeping soundly at time of visit and did not respond to voice. Limited physical exam completed.   Pt with minimal intake and refusing supplements.   Pt receiving TPN at 40 ml/hr, which provides 1009 kcals and 42 grams protein, meeting 53% of estimated kcal needs and 47% of estimated protein needs. Per pharmacy note, plan to increase TPN to goal rate of 80 ml/hr, which provides 2066 kcals and 96 grams of protein, meeting 100% of estimated kcal and protein needs.   Pt has experienced a 9.9% wt loss over the past 3 months, which is significant for time frame.   Per general surgery notes, plan for colectomy with colostomy tomorrow (06/06/20).   Labs reviewed: K and Phos WDL. Mg: 1.6, CBGS: 122-147 (inpatient orders for glycemic control are 0-6 units insulin aspart every 4 hours).   NUTRITION - FOCUSED PHYSICAL EXAM:    Most Recent Value  Orbital Region Mild  depletion  Upper Arm Region Moderate depletion  Thoracic and Lumbar Region Unable to assess  Buccal Region No depletion  Temple Region Mild depletion  Clavicle Bone Region Unable to assess  Clavicle and Acromion Bone Region Unable to assess  Scapular Bone Region Unable to assess  Dorsal Hand Moderate depletion  Patellar Region Unable to assess  Anterior Thigh Region Unable to assess  Posterior Calf Region Unable to assess  Edema (RD Assessment) None  Hair Reviewed  Eyes Reviewed  Mouth Reviewed  Skin Reviewed  Nails Reviewed       Diet Order:   Diet Order            Diet NPO time specified Except for: Sips with Meds, Ice Chips  Diet effective midnight           Diet full liquid Room service appropriate? Yes; Fluid consistency: Thin  Diet effective now                 EDUCATION NEEDS:   No education needs have been identified at this time  Skin:  Skin Assessment: Skin Integrity Issues: Skin Integrity Issues:: DTI, Incisions, Other (Comment) DTI: lt heel Incisions: rt groin, lt groin, abdomen Other: MASD rt buttocks  Last BM:  06/04/20  Height:   Ht Readings from Last 1 Encounters:  06/01/20 5' 3"  (1.6 m)    Weight:   Wt Readings from Last 1 Encounters:  06/05/20 70.4 kg    Ideal Body Weight:  52.3 kg  BMI:  Body  mass index is 27.49 kg/m.  Estimated Nutritional Needs:   Kcal:  1900-2100  Protein:  90-105 grams  Fluid:  > 1.9 L    Loistine Chance, RD, LDN, [place] Registered Dietitian II Certified Diabetes Care and Education Specialist Please refer to [facility] for RD and/or RD on-call/weekend/after hours pager

## 2020-06-05 NOTE — H&P (View-Only) (Signed)
Subjective: Cc:  NAEO. No new complaints. States she wants to have surgery to relieve her obstruction and voices understanding that it will result in a colostomy.   ROS: See above, otherwise other systems negative  Objective: Vital signs in last 24 hours: Temp:  [97.8 F (36.6 C)-98.8 F (37.1 C)] 97.8 F (36.6 C) (08/04 0612) Pulse Rate:  [64-72] 72 (08/04 0612) Resp:  [15-20] 20 (08/04 0612) BP: (151-165)/(50-64) 165/52 (08/04 0612) SpO2:  [96 %-99 %] 99 % (08/04 0612) Weight:  [70.4 kg] 70.4 kg (08/04 0058) Last BM Date: 06/04/20  Intake/Output from previous day: 08/03 0701 - 08/04 0700 In: 863 [P.O.:400; I.V.:463] Out: 300 [Urine:300] Intake/Output this shift: No intake/output data recorded.  PE: Abd: soft, nontender, mild distention, midline wound is healing  Lab Results:  Recent Labs    06/05/20 0410  WBC 5.9  HGB 9.6*  HCT 30.1*  PLT 245   BMET Recent Labs    06/05/20 0410  NA 137  K 3.5  CL 101  CO2 28  GLUCOSE 138*  BUN 6*  CREATININE 0.70  CALCIUM 8.1*   CMP     Component Value Date/Time   NA 137 06/05/2020 0410   K 3.5 06/05/2020 0410   CL 101 06/05/2020 0410   CO2 28 06/05/2020 0410   GLUCOSE 138 (H) 06/05/2020 0410   BUN 6 (L) 06/05/2020 0410   CREATININE 0.70 06/05/2020 0410   CALCIUM 8.1 (L) 06/05/2020 0410   PROT 5.5 (L) 06/05/2020 0410   ALBUMIN 2.0 (L) 06/05/2020 0410   AST 13 (L) 06/05/2020 0410   ALT 8 06/05/2020 0410   ALKPHOS 39 06/05/2020 0410   BILITOT 0.4 06/05/2020 0410   GFRNONAA >60 06/05/2020 0410   GFRAA >60 06/05/2020 0410   Lipase     Component Value Date/Time   LIPASE 31 05/31/2020 2038   Studies/Results: [place] EKG SITE RITE  Result Date: 06/04/2020 If Site Rite image not attached, placement could not be confirmed due to current cardiac rhythm.   Anti-infectives: Anti-infectives (From admission, onward)   Start     Dose/Rate Route Frequency Ordered Stop   06/02/20 1030  vancomycin (VANCOCIN)  50 mg/mL oral solution 125 mg     Discontinue     125 mg Oral 3 times daily before meals & bedtime 06/02/20 0822 06/12/20 1029   06/01/20 1130  metroNIDAZOLE (FLAGYL) tablet 500 mg  Status:  Discontinued        500 mg Oral 3 times daily 06/01/20 1124 06/02/20 2130       Assessment/Plan PAD chronic diastolic heart failure CKD stage II HTN T2DM HLD Chronic back pain Depression 1 month s/p aortobifem bypass c diff colitis  Sigmoid colon stricture secondary to ischemic colitis -patient readmitted with more abdominal pain.  Still having diarrhea. No flatus. - barium enema demonstrates long, smooth stricture from mid descending colon to the rectum - I have concerns based on patients clinical history and above imaging that she will not tolerate solid foods very well, however she is taking in almost no full liquids and just drinking juice. I will order SOFT diet - she was tolerating small amounts of this last week. Discussed with patient that she should chew her food well, eat small amounts at a time, and avoid tough meats. I also think this patient should be started on TPN. Prealbumin 10.8 less than a week ago, repeat pending today. -patient is now over 6 weeks  out from a  laparotomy and is agreeable to proceed with partial colectomy/colostomy vs diverting loop colostomy. See yesterdays note for full surgical discussion documented by Dr. Redmond Pulling. Will discuss timing of surgery with MD, likely tomorrow 8/5.  FEN - TPN, SOFT/FLD, NPO after MN VTE - heparin ID - vanc for c diff   LOS: 4 days     Jill Alexanders , [facility] Surgery 06/05/2020, 8:25 AM Please see Amion for pager number during day hours 7:00am-4:30pm or 7:00am -11:30am on weekends

## 2020-06-05 NOTE — Progress Notes (Signed)
Wound vac air leak reinforced with Tegaderm dressing.

## 2020-06-05 NOTE — Progress Notes (Signed)
Pt Reused to have blood Checked. No insulin was given at 0800.

## 2020-06-05 NOTE — Progress Notes (Signed)
Subjective: Cc:  Tonya Hughes. No new complaints. States she wants to have surgery to relieve her obstruction and voices understanding that it will result in a colostomy.   ROS: See above, otherwise other systems negative  Objective: Vital signs in last 24 hours: Temp:  [97.8 F (36.6 C)-98.8 F (37.1 C)] 97.8 F (36.6 C) (08/04 0612) Pulse Rate:  [64-72] 72 (08/04 0612) Resp:  [15-20] 20 (08/04 0612) BP: (151-165)/(50-64) 165/52 (08/04 0612) SpO2:  [96 %-99 %] 99 % (08/04 0612) Weight:  [70.4 kg] 70.4 kg (08/04 0058) Last BM Date: 06/04/20  Intake/Output from previous day: 08/03 0701 - 08/04 0700 In: 863 [P.O.:400; I.V.:463] Out: 300 [Urine:300] Intake/Output this shift: No intake/output data recorded.  PE: Abd: soft, nontender, mild distention, midline wound is healing  Lab Results:  Recent Labs    06/05/20 0410  WBC 5.9  HGB 9.6*  HCT 30.1*  PLT 245   BMET Recent Labs    06/05/20 0410  NA 137  K 3.5  CL 101  CO2 28  GLUCOSE 138*  BUN 6*  CREATININE 0.70  CALCIUM 8.1*   CMP     Component Value Date/Time   NA 137 06/05/2020 0410   K 3.5 06/05/2020 0410   CL 101 06/05/2020 0410   CO2 28 06/05/2020 0410   GLUCOSE 138 (H) 06/05/2020 0410   BUN 6 (L) 06/05/2020 0410   CREATININE 0.70 06/05/2020 0410   CALCIUM 8.1 (L) 06/05/2020 0410   PROT 5.5 (L) 06/05/2020 0410   ALBUMIN 2.0 (L) 06/05/2020 0410   AST 13 (L) 06/05/2020 0410   ALT 8 06/05/2020 0410   ALKPHOS 39 06/05/2020 0410   BILITOT 0.4 06/05/2020 0410   GFRNONAA >60 06/05/2020 0410   GFRAA >60 06/05/2020 0410   Lipase     Component Value Date/Time   LIPASE 31 05/31/2020 2038   Studies/Results: [place] EKG SITE RITE  Result Date: 06/04/2020 If Site Rite image not attached, placement could not be confirmed due to current cardiac rhythm.   Anti-infectives: Anti-infectives (From admission, onward)   Start     Dose/Rate Route Frequency Ordered Stop   06/02/20 1030  vancomycin (VANCOCIN)  50 mg/mL oral solution 125 mg     Discontinue     125 mg Oral 3 times daily before meals & bedtime 06/02/20 0822 06/12/20 1029   06/01/20 1130  metroNIDAZOLE (FLAGYL) tablet 500 mg  Status:  Discontinued        500 mg Oral 3 times daily 06/01/20 1124 06/02/20 0277       Assessment/Plan PAD chronic diastolic heart failure CKD stage II HTN T2DM HLD Chronic back pain Depression 1 month s/p aortobifem bypass c diff colitis  Sigmoid colon stricture secondary to ischemic colitis -patient readmitted with more abdominal pain.  Still having diarrhea. No flatus. - barium enema demonstrates long, smooth stricture from mid descending colon to the rectum - I have concerns based on patients clinical history and above imaging that she will not tolerate solid foods very well, however she is taking in almost no full liquids and just drinking juice. I will order SOFT diet - she was tolerating small amounts of this last week. Discussed with patient that she should chew her food well, eat small amounts at a time, and avoid tough meats. I also think this patient should be started on TPN. Prealbumin 10.8 less than a week ago, repeat pending today. -patient is now over 6 weeks  out from a  laparotomy and is agreeable to proceed with partial colectomy/colostomy vs diverting loop colostomy. See yesterdays note for full surgical discussion documented by Dr. Redmond Pulling. Will discuss timing of surgery with MD, likely tomorrow 8/5.  FEN - TPN, SOFT/FLD, NPO after MN VTE - heparin ID - vanc for c diff   LOS: 4 days     Jill Alexanders , [facility] Surgery 06/05/2020, 8:25 AM Please see Amion for pager number during day hours 7:00am-4:30pm or 7:00am -11:30am on weekends

## 2020-06-05 NOTE — Progress Notes (Signed)
PROGRESS NOTE    Tonya Hughes  VHQ:[id number] DOB: 1951/08/14 DOA: 05/31/2020 PCP: Monico Blitz, MD   Brief Narrative:   Tonya Hughes is an 69 y.o. female past medical history of chronic diastolic heart failure, diabetes mellitus DVT essential hypertension status post aortobifemoral bypass on 04/18/2020 recently discharged from the hospital for C. difficile colitis on 05/30/2020 who presents to the hospital with ongoing abdominal pain.  During her recent admission she was transfused 2 units of packed red blood cells due to rectal bleeding GI was consulted who perform a flex sig on 05/20/2020 that showed colonic stricture with pathology consistent with ischemic colitis.  During this time GI recommended surgical consult which the patient refused at that time.  Since discharge she is of been having worsening abdominal pain and diarrhea, she is now amenable to surgery.  8/4: She has agreed to surgery.   Assessment & Plan:  Readmitted for abdominal pain due to sigmoid colonic stricture causing partial small bowel obstruction     - Abdominal x-ray done on 06/01/2020 showed partial small bowel obstruction.       - Barium enema demonstrated a long smooth stricture from the descending colon to the rectum on her previous admission.     - she is on TPN     - surgery has eval'd; she will go to OR tomorrow for ex-lap, colon resection w/ ostomy; appreciate their help  History of of C. Dif. colitis     - She tested positive for C. difficile on 05/17/2020, still having some diarrhea, Flagyl is not optimal for treatment of C. difficile colitis.       - She was being noncompliant with her Flagyl as an outpatient.     - Started on oral vancomycin and will continue total treatment of 10 days.     - diarrhea is improved, follow, maintain precautions  Vaginal candidiasis     - nystatin  Hypovolemic hyponatremia:     - resolved on fluids  Hypokalemia Hypomagnesemia     - replace K+/Mg2+; follow  AKI      - resolved, follow  Hx of PVD status post aortobifemoral bypass:     - aspirin and statins.  Essential hypertension Chronic diastolic heart failure     - lisinopril, coreg     - follow I&O, daily wts  DMt2     - A1c of 6.9.     - SSI  Left heel unstageable sacral decubitus ulcer present on admission:     - RN Pressure Injury Documentation:     - Pressure Injury 04/27/20 Heel Left Deep Tissue Pressure Injury - Purple or maroon localized area of discolored intact skin or blood-filled blister due to damage of underlying soft tissue from pressure and/or shear. (Active) 04/27/20 2110 Location: Heel Location Orientation: Left Staging: Deep Tissue Pressure Injury - Purple or maroon localized area of discolored intact skin or blood-filled blister due to damage of underlying soft tissue from pressure and/or shear. Wound Description (Comments):  Present on Admission:  DVT prophylaxis: heparin Code Status: DNR Family Communication: None at bedside   Status is: Inpatient  Remains inpatient appropriate because:Inpatient level of care appropriate due to severity of illness   Dispo: The patient is from: Home              Anticipated d/c is to: Home              Anticipated d/c date is: > 3 days  Patient currently is not medically stable to d/c.  Consultants:   General surgery  Antimicrobials:  . Vancomycin   Subjective: "I'm not going to eat that!"  Objective: Vitals:   06/05/20 0058 06/05/20 0612 06/05/20 0828 06/05/20 1135  BP:  (!) 165/52 (!) 157/75 (!) 154/73  Pulse:  72 74 68  Resp:  20 20 20   Temp:  97.8 F (36.6 C) 98 F (36.7 C) 98.4 F (36.9 C)  TempSrc:  Oral Oral Oral  SpO2:  99% 96% 99%  Weight: 70.4 kg     Height:        Intake/Output Summary (Last 24 hours) at 06/05/2020 1740 Last data filed at 06/05/2020 1500 Gross per 24 hour  Intake 1035.46 ml  Output 900 ml  Net 135.46 ml   Filed Weights   06/03/20 0542 06/04/20 0644 06/05/20  0058  Weight: 70.9 kg 69.5 kg 70.4 kg    Examination:  General: 69 y.o. female resting in bed in NAD Cardiovascular: RRR, +S1, S2, no m/g/r, equal pulses throughout Respiratory: CTABL, no w/r/r, normal WOB GI: BS+, ND, general global TTP, no masses noted, no organomegaly noted MSK: No e/c/c Neuro: alert to name, follows commands Psyc: Appropriate interaction and affect, calm/cooperative   Data Reviewed: I have personally reviewed following labs and imaging studies.  CBC: Recent Labs  Lab 05/31/20 2038 06/01/20 1251 06/02/20 0708 06/05/20 0410  WBC 14.4* 8.6 6.1 5.9  NEUTROABS  --   --   --  3.2  HGB 12.0 9.7* 8.9* 9.6*  HCT 37.8 31.7* 29.2* 30.1*  MCV 94.3 96.6 95.4 95.3  PLT 396 271 271 086   Basic Metabolic Panel: Recent Labs  Lab 05/31/20 2038 06/01/20 1251 06/02/20 0708 06/05/20 0410  NA 132* 136 137 137  K 2.9* 2.9* 3.7 3.5  CL 96* 102 104 101  CO2 23 25 27 28   GLUCOSE 158* 101* 91 138*  BUN 7* 9 9 6*  CREATININE 1.14* 1.23* 1.03* 0.70  CALCIUM 8.4* 7.8* 8.0* 8.1*  MG  --  1.6*  --  1.6*  PHOS  --   --   --  3.4   GFR: Estimated Creatinine Clearance: 64.2 mL/min (by C-G formula based on SCr of 0.7 mg/dL). Liver Function Tests: Recent Labs  Lab 05/31/20 2038 06/05/20 0410  AST 21 13*  ALT 12 8  ALKPHOS 58 39  BILITOT 0.5 0.4  PROT 7.1 5.5*  ALBUMIN 2.6* 2.0*   Recent Labs  Lab 05/31/20 2038  LIPASE 31   No results for input(s): AMMONIA in the last 168 hours. Coagulation Profile: No results for input(s): INR, PROTIME in the last 168 hours. Cardiac Enzymes: No results for input(s): CKTOTAL, CKMB, CKMBINDEX, TROPONINI in the last 168 hours. BNP (last 3 results) No results for input(s): PROBNP in the last 8760 hours. HbA1C: No results for input(s): HGBA1C in the last 72 hours. CBG: Recent Labs  Lab 06/04/20 2029 06/05/20 0034 06/05/20 0406 06/05/20 0736 06/05/20 1653  GLUCAP 153* 122* 129* 147* 143*   Lipid Profile: Recent Labs      06/05/20 0410  TRIG 113   Thyroid Function Tests: No results for input(s): TSH, T4TOTAL, FREET4, T3FREE, THYROIDAB in the last 72 hours. Anemia Panel: No results for input(s): VITAMINB12, FOLATE, FERRITIN, TIBC, IRON, RETICCTPCT in the last 72 hours. Sepsis Labs: Recent Labs  Lab 06/01/20 1251 06/01/20 1637  LATICACIDVEN 1.1 0.9    Recent Results (from the past 240 hour(s))  Surgical pcr screen  Status: None   Collection Time: 05/27/20 12:37 AM   Specimen: Nasal Mucosa; Nasal Swab  Result Value Ref Range Status   MRSA, PCR NEGATIVE NEGATIVE Final   Staphylococcus aureus NEGATIVE NEGATIVE Final    Comment: (NOTE) The Xpert SA Assay (FDA approved for NASAL specimens in patients 72 years of age and older), is one component of a comprehensive surveillance program. It is not intended to diagnose infection nor to guide or monitor treatment. Performed at [facility], [place] [address] [place], [place] [address]   SARS Coronavirus 2 by RT PCR (hospital order, performed in [facility] hospital lab) Nasopharyngeal Nasopharyngeal Swab     Status: None   Collection Time: 06/01/20 12:17 PM   Specimen: Nasopharyngeal Swab  Result Value Ref Range Status   SARS Coronavirus 2 NEGATIVE NEGATIVE Final    Comment: (NOTE) SARS-CoV-2 target nucleic acids are NOT DETECTED.  The SARS-CoV-2 RNA is generally detectable in upper and lower respiratory specimens during the acute phase of infection. The lowest concentration of SARS-CoV-2 viral copies this assay can detect is 250 copies / mL. A negative result does not preclude SARS-CoV-2 infection and should not be used as the sole basis for treatment or other patient management decisions.  A negative result may occur with improper specimen collection / handling, submission of specimen other than nasopharyngeal swab, presence of viral mutation(s) within the areas targeted by this assay, and inadequate number of viral copies (<250 copies  / mL). A negative result must be combined with clinical observations, patient history, and epidemiological information.  Fact Sheet for Patients:   StrictlyIdeas.no  Fact Sheet for Healthcare Providers: BankingDealers.co.za  This test is not yet approved or  cleared by the [place] FDA and has been authorized for detection and/or diagnosis of SARS-CoV-2 by FDA under an Emergency Use Authorization (EUA).  This EUA will remain in effect (meaning this test can be used) for the duration of the COVID-19 declaration under Section 564(b)(1) of the Act, 21 U.S.C. section 360bbb-3(b)(1), unless the authorization is terminated or revoked sooner.  Performed at [facility], [place] [address] [place], [place] [address]   Culture, blood (routine x 2)     Status: None (Preliminary result)   Collection Time: 06/01/20 12:51 PM   Specimen: BLOOD  Result Value Ref Range Status   Specimen Description BLOOD RIGHT ANTECUBITAL  Final   Special Requests   Final    BOTTLES DRAWN AEROBIC AND ANAEROBIC Blood Culture adequate volume   Culture   Final    NO GROWTH 4 DAYS Performed at [facility], [place] [address], [place], [place] [address]    Report Status PENDING  Incomplete  Culture, blood (routine x 2)     Status: None (Preliminary result)   Collection Time: 06/01/20  4:37 PM   Specimen: BLOOD  Result Value Ref Range Status   Specimen Description BLOOD RIGHT ANTECUBITAL  Final   Special Requests   Final    BOTTLES DRAWN AEROBIC ONLY Blood Culture adequate volume   Culture   Final    NO GROWTH 4 DAYS Performed at [facility], [place] [address], [place], [place] [address]    Report Status PENDING  Incomplete      Radiology Studies: [place] EKG SITE RITE  Result Date: 06/04/2020 If Site Rite image not attached, placement could not be confirmed due to current cardiac rhythm.    Scheduled Meds: . aspirin EC  81 mg Oral Daily  .  atorvastatin  40 mg Oral Daily  . carvedilol  3.125 mg Oral BID WC  . Chlorhexidine Gluconate Cloth  6 each Topical Daily  . heparin  5,000 Units Subcutaneous Q8H  . insulin aspart  0-6 Units Subcutaneous Q4H  . lisinopril  5 mg Oral Daily  . nystatin cream   Topical BID  . PARoxetine  20 mg Oral Daily  . potassium chloride SA  20 mEq Oral Daily  . sodium chloride flush  10-40 mL Intracatheter Q12H  . sodium chloride flush  3 mL Intravenous Once  . sodium chloride flush  3 mL Intravenous Q12H  . vancomycin  125 mg Oral TID AC & HS   Continuous Infusions: . sodium chloride 250 mL (06/05/20 1058)  . [START ON 06/06/2020] cefoTEtan (CEFOTAN) IV    . TPN ADULT (ION) 40 mL/hr at 06/04/20 1819  . TPN ADULT (ION)       LOS: 4 days    Time spent: 25 minutes spent in the coordination of care today.    Jonnie Finner, DO [facility]  If 7PM-7AM, please contact night-coverage [email or web address] 06/05/2020, 5:40 PM

## 2020-06-06 ENCOUNTER — Encounter (HOSPITAL_COMMUNITY)
Admission: EM | Disposition: A | Discharge status: Skilled Nursing Facility | Payer: Self-pay | Source: Home / Self Care | Attending: Internal Medicine

## 2020-06-06 ENCOUNTER — Inpatient Hospital Stay (HOSPITAL_COMMUNITY): Payer: Medicare Other | Admitting: Anesthesiology

## 2020-06-06 ENCOUNTER — Encounter (HOSPITAL_COMMUNITY): Payer: Self-pay | Admitting: Internal Medicine

## 2020-06-06 HISTORY — PX: COLECTOMY WITH COLOSTOMY CREATION/HARTMANN PROCEDURE: SHX6598

## 2020-06-06 HISTORY — PX: APPLICATION OF WOUND VAC: SHX5189

## 2020-06-06 HISTORY — PX: LYSIS OF ADHESION: SHX5961

## 2020-06-06 HISTORY — PX: LAPAROTOMY: SHX154

## 2020-06-06 LAB — CULTURE, BLOOD (ROUTINE X 2)
Culture: NO GROWTH
Culture: NO GROWTH
Special Requests: ADEQUATE
Special Requests: ADEQUATE

## 2020-06-06 LAB — COMPREHENSIVE METABOLIC PANEL
ALT: 9 U/L (ref 0–44)
AST: 14 U/L — ABNORMAL LOW (ref 15–41)
Albumin: 2.1 g/dL — ABNORMAL LOW (ref 3.5–5.0)
Alkaline Phosphatase: 40 U/L (ref 38–126)
Anion gap: 7 (ref 5–15)
BUN: 9 mg/dL (ref 8–23)
CO2: 29 mmol/L (ref 22–32)
Calcium: 8.4 mg/dL — ABNORMAL LOW (ref 8.9–10.3)
Chloride: 101 mmol/L (ref 98–111)
Creatinine, Ser: 0.67 mg/dL (ref 0.44–1.00)
GFR calc Af Amer: >60 mL/min (ref >60)
GFR calc non Af Amer: >60 mL/min (ref >60)
Glucose, Bld: 152 mg/dL — ABNORMAL HIGH (ref 70–99)
Potassium: 5.1 mmol/L (ref 3.5–5.1)
Sodium: 137 mmol/L (ref 135–145)
Total Bilirubin: 0.6 mg/dL (ref 0.3–1.2)
Total Protein: 6 g/dL — ABNORMAL LOW (ref 6.5–8.1)

## 2020-06-06 LAB — CBC WITH DIFFERENTIAL/PLATELET
Abs Immature Granulocytes: 0.02 10*3/uL (ref 0.00–0.07)
Basophils Absolute: 0 10*3/uL (ref 0.0–0.1)
Basophils Relative: 1 %
Eosinophils Absolute: 0.2 10*3/uL (ref 0.0–0.5)
Eosinophils Relative: 3 %
HCT: 32.3 % — ABNORMAL LOW (ref 36.0–46.0)
Hemoglobin: 10.1 g/dL — ABNORMAL LOW (ref 12.0–15.0)
Immature Granulocytes: 0 %
Lymphocytes Relative: 41 %
Lymphs Abs: 2.3 10*3/uL (ref 0.7–4.0)
MCH: 30.2 pg (ref 26.0–34.0)
MCHC: 31.3 g/dL (ref 30.0–36.0)
MCV: 96.7 fL (ref 80.0–100.0)
Monocytes Absolute: 0.4 10*3/uL (ref 0.1–1.0)
Monocytes Relative: 7 %
Neutro Abs: 2.6 10*3/uL (ref 1.7–7.7)
Neutrophils Relative %: 48 %
Platelets: 230 10*3/uL (ref 150–400)
RBC: 3.34 MIL/uL — ABNORMAL LOW (ref 3.87–5.11)
RDW: 16.7 % — ABNORMAL HIGH (ref 11.5–15.5)
WBC: 5.5 10*3/uL (ref 4.0–10.5)
nRBC: 0 % (ref 0.0–0.2)

## 2020-06-06 LAB — GLUCOSE, CAPILLARY
Glucose-Capillary: 144 mg/dL — ABNORMAL HIGH (ref 70–99)
Glucose-Capillary: 132 mg/dL — ABNORMAL HIGH (ref 70–99)
Glucose-Capillary: 157 mg/dL — ABNORMAL HIGH (ref 70–99)
Glucose-Capillary: 223 mg/dL — ABNORMAL HIGH (ref 70–99)
Glucose-Capillary: 323 mg/dL — ABNORMAL HIGH (ref 70–99)
Glucose-Capillary: 315 mg/dL — ABNORMAL HIGH (ref 70–99)

## 2020-06-06 LAB — PHOSPHORUS: Phosphorus: 4.1 mg/dL (ref 2.5–4.6)

## 2020-06-06 LAB — MAGNESIUM: Magnesium: 2.2 mg/dL (ref 1.7–2.4)

## 2020-06-06 LAB — TYPE AND SCREEN
ABO/RH(D): A NEG
Antibody Screen: NEGATIVE

## 2020-06-06 SURGERY — COLECTOMY, WITH COLOSTOMY CREATION
Anesthesia: General | Site: Abdomen

## 2020-06-06 MED ORDER — SODIUM CHLORIDE 0.9 % IV SOLN
INTRAVENOUS | Status: DC | PRN
  Administered 2020-06-06: 500 mL

## 2020-06-06 MED ORDER — BUPIVACAINE LIPOSOME 1.3 % IJ SUSP
20.0000 mL | Freq: Once | INTRAMUSCULAR | Status: AC
  Administered 2020-06-06: 20 mL
  Filled 2020-06-06: qty 20

## 2020-06-06 MED ORDER — TRAVASOL 10 % IV SOLN
INTRAVENOUS | Status: AC
  Filled 2020-06-06: qty 960

## 2020-06-06 MED ORDER — METRONIDAZOLE IN NACL 5-0.79 MG/ML-% IV SOLN
500.0000 mg | Freq: Three times a day (TID) | INTRAVENOUS | Status: DC
  Administered 2020-06-06 – 2020-06-07 (×3): 500 mg via INTRAVENOUS
  Filled 2020-06-06 (×3): qty 100

## 2020-06-06 MED ORDER — ALTEPLASE 2 MG IJ SOLR
2.0000 mg | Freq: Once | INTRAMUSCULAR | Status: AC
  Administered 2020-06-06: 2 mg
  Filled 2020-06-06: qty 2

## 2020-06-06 MED ORDER — DEXAMETHASONE SODIUM PHOSPHATE 10 MG/ML IJ SOLN
INTRAMUSCULAR | Status: AC
  Filled 2020-06-06: qty 1

## 2020-06-06 MED ORDER — DEXAMETHASONE SODIUM PHOSPHATE 10 MG/ML IJ SOLN
INTRAMUSCULAR | Status: DC | PRN
  Administered 2020-06-06: 5 mg via INTRAVENOUS

## 2020-06-06 MED ORDER — ACETAMINOPHEN 10 MG/ML IV SOLN
1000.0000 mg | Freq: Once | INTRAVENOUS | Status: DC | PRN
  Administered 2020-06-06: 1000 mg via INTRAVENOUS

## 2020-06-06 MED ORDER — DEXMEDETOMIDINE (PRECEDEX) IN NS 20 MCG/5ML (4 MCG/ML) IV SYRINGE
PREFILLED_SYRINGE | INTRAVENOUS | Status: AC
  Filled 2020-06-06: qty 5

## 2020-06-06 MED ORDER — PROPOFOL 10 MG/ML IV BOLUS
INTRAVENOUS | Status: DC | PRN
  Administered 2020-06-06: 100 mg via INTRAVENOUS

## 2020-06-06 MED ORDER — ENSURE SURGERY PO LIQD
237.0000 mL | Freq: Two times a day (BID) | ORAL | Status: DC
  Filled 2020-06-06 (×8): qty 237

## 2020-06-06 MED ORDER — DEXMEDETOMIDINE HCL 200 MCG/2ML IV SOLN
INTRAVENOUS | Status: DC | PRN
  Administered 2020-06-06: 8 ug via INTRAVENOUS

## 2020-06-06 MED ORDER — FENTANYL CITRATE (PF) 250 MCG/5ML IJ SOLN
INTRAMUSCULAR | Status: AC
  Filled 2020-06-06: qty 5

## 2020-06-06 MED ORDER — HYDROMORPHONE HCL 1 MG/ML IJ SOLN
0.2500 mg | INTRAMUSCULAR | Status: DC | PRN
  Administered 2020-06-06: 0.25 mg via INTRAVENOUS

## 2020-06-06 MED ORDER — ONDANSETRON HCL 4 MG PO TABS: 4.0000 mg | ORAL_TABLET | Freq: Four times a day (QID) | ORAL | Status: DC | PRN

## 2020-06-06 MED ORDER — EPHEDRINE SULFATE 50 MG/ML IJ SOLN
INTRAMUSCULAR | Status: DC | PRN
  Administered 2020-06-06: 5 mg via INTRAVENOUS

## 2020-06-06 MED ORDER — ONDANSETRON HCL 4 MG/2ML IJ SOLN: 4.0000 mg | Freq: Four times a day (QID) | INTRAMUSCULAR | Status: DC | PRN

## 2020-06-06 MED ORDER — DIPHENHYDRAMINE HCL 50 MG/ML IJ SOLN
25.0000 mg | Freq: Once | INTRAMUSCULAR | Status: DC | PRN
  Filled 2020-06-06: qty 0.5

## 2020-06-06 MED ORDER — ACETAMINOPHEN 10 MG/ML IV SOLN
INTRAVENOUS | Status: AC
  Filled 2020-06-06: qty 100

## 2020-06-06 MED ORDER — PROPOFOL 10 MG/ML IV BOLUS
INTRAVENOUS | Status: AC
  Filled 2020-06-06: qty 20

## 2020-06-06 MED ORDER — SUCCINYLCHOLINE CHLORIDE 20 MG/ML IJ SOLN
INTRAMUSCULAR | Status: DC | PRN
  Administered 2020-06-06: 100 mg via INTRAVENOUS

## 2020-06-06 MED ORDER — ACETAMINOPHEN 500 MG PO TABS
1000.0000 mg | ORAL_TABLET | Freq: Four times a day (QID) | ORAL | Status: AC
  Administered 2020-06-06 – 2020-06-13 (×20): 1000 mg via ORAL
  Filled 2020-06-06 (×21): qty 2

## 2020-06-06 MED ORDER — SACCHAROMYCES BOULARDII 250 MG PO CAPS
250.0000 mg | ORAL_CAPSULE | Freq: Two times a day (BID) | ORAL | Status: DC
  Administered 2020-06-06 – 2020-06-14 (×16): 250 mg via ORAL
  Filled 2020-06-06 (×16): qty 1

## 2020-06-06 MED ORDER — SODIUM CHLORIDE 0.9 % IV SOLN
INTRAVENOUS | Status: AC
  Filled 2020-06-06: qty 500000

## 2020-06-06 MED ORDER — OXYCODONE HCL 5 MG PO TABS
5.0000 mg | ORAL_TABLET | ORAL | Status: DC | PRN
  Administered 2020-06-07 – 2020-06-14 (×21): 10 mg via ORAL
  Filled 2020-06-06 (×22): qty 2

## 2020-06-06 MED ORDER — LIDOCAINE 2% (20 MG/ML) 5 ML SYRINGE
INTRAMUSCULAR | Status: DC | PRN
  Administered 2020-06-06: 60 mg via INTRAVENOUS

## 2020-06-06 MED ORDER — FENTANYL CITRATE (PF) 250 MCG/5ML IJ SOLN
INTRAMUSCULAR | Status: DC | PRN
  Administered 2020-06-06: 150 ug via INTRAVENOUS
  Administered 2020-06-06: 50 ug via INTRAVENOUS
  Administered 2020-06-06 (×2): 100 ug via INTRAVENOUS

## 2020-06-06 MED ORDER — DIPHENHYDRAMINE HCL 50 MG/ML IJ SOLN: 12.5000 mg | Freq: Four times a day (QID) | INTRAMUSCULAR | Status: DC | PRN

## 2020-06-06 MED ORDER — AMISULPRIDE (ANTIEMETIC) 5 MG/2ML IV SOLN: 10.0000 mg | Freq: Once | INTRAVENOUS | Status: DC | PRN

## 2020-06-06 MED ORDER — PHENYLEPHRINE HCL-NACL 10-0.9 MG/250ML-% IV SOLN
INTRAVENOUS | Status: DC | PRN
  Administered 2020-06-06: 25 ug/min via INTRAVENOUS

## 2020-06-06 MED ORDER — LIDOCAINE 2% (20 MG/ML) 5 ML SYRINGE
INTRAMUSCULAR | Status: AC
  Filled 2020-06-06: qty 5

## 2020-06-06 MED ORDER — MIDAZOLAM HCL 2 MG/2ML IJ SOLN
INTRAMUSCULAR | Status: AC
  Filled 2020-06-06: qty 2

## 2020-06-06 MED ORDER — SUCCINYLCHOLINE CHLORIDE 200 MG/10ML IV SOSY
PREFILLED_SYRINGE | INTRAVENOUS | Status: AC
  Filled 2020-06-06: qty 10

## 2020-06-06 MED ORDER — ACETAMINOPHEN 325 MG PO TABS: 325.0000 mg | ORAL_TABLET | Freq: Once | ORAL | Status: DC | PRN

## 2020-06-06 MED ORDER — SUGAMMADEX SODIUM 200 MG/2ML IV SOLN
INTRAVENOUS | Status: DC | PRN
  Administered 2020-06-06: 200 mg via INTRAVENOUS

## 2020-06-06 MED ORDER — ROCURONIUM BROMIDE 10 MG/ML (PF) SYRINGE
PREFILLED_SYRINGE | INTRAVENOUS | Status: DC | PRN
  Administered 2020-06-06: 50 mg via INTRAVENOUS
  Administered 2020-06-06: 20 mg via INTRAVENOUS
  Administered 2020-06-06: 30 mg via INTRAVENOUS

## 2020-06-06 MED ORDER — MORPHINE SULFATE (PF) 2 MG/ML IV SOLN
2.0000 mg | INTRAVENOUS | Status: DC | PRN
  Administered 2020-06-06 – 2020-06-10 (×16): 2 mg via INTRAVENOUS
  Filled 2020-06-06 (×17): qty 1

## 2020-06-06 MED ORDER — PIPERACILLIN-TAZOBACTAM 3.375 G IVPB 30 MIN
3.3750 g | Freq: Three times a day (TID) | INTRAVENOUS | Status: AC
  Administered 2020-06-06 – 2020-06-11 (×15): 3.375 g via INTRAVENOUS
  Filled 2020-06-06 (×15): qty 50

## 2020-06-06 MED ORDER — ROCURONIUM BROMIDE 10 MG/ML (PF) SYRINGE
PREFILLED_SYRINGE | INTRAVENOUS | Status: AC
  Filled 2020-06-06: qty 10

## 2020-06-06 MED ORDER — LACTATED RINGERS IV SOLN: INTRAVENOUS | Status: DC

## 2020-06-06 MED ORDER — HEPARIN SODIUM (PORCINE) 5000 UNIT/ML IJ SOLN
5000.0000 [IU] | Freq: Three times a day (TID) | INTRAMUSCULAR | Status: DC
  Administered 2020-06-07 – 2020-06-14 (×22): 5000 [IU] via SUBCUTANEOUS
  Filled 2020-06-06 (×22): qty 1

## 2020-06-06 MED ORDER — 0.9 % SODIUM CHLORIDE (POUR BTL) OPTIME
TOPICAL | Status: DC | PRN
  Administered 2020-06-06 (×6): 1000 mL

## 2020-06-06 MED ORDER — EPHEDRINE 5 MG/ML INJ
INTRAVENOUS | Status: AC
  Filled 2020-06-06: qty 10

## 2020-06-06 MED ORDER — DIPHENHYDRAMINE HCL 12.5 MG/5ML PO ELIX: 12.5000 mg | ORAL_SOLUTION | Freq: Four times a day (QID) | ORAL | Status: DC | PRN

## 2020-06-06 MED ORDER — HYDROMORPHONE HCL 1 MG/ML IJ SOLN
INTRAMUSCULAR | Status: AC
  Filled 2020-06-06: qty 1

## 2020-06-06 MED ORDER — SIMETHICONE 80 MG PO CHEW: 40.0000 mg | CHEWABLE_TABLET | Freq: Four times a day (QID) | ORAL | Status: DC | PRN

## 2020-06-06 MED ORDER — ACETAMINOPHEN 160 MG/5ML PO SOLN: 325.0000 mg | Freq: Once | ORAL | Status: DC | PRN

## 2020-06-06 MED ORDER — SODIUM CHLORIDE 0.9 % IV SOLN: INTRAVENOUS | Status: DC

## 2020-06-06 MED ORDER — MEPERIDINE HCL 25 MG/ML IJ SOLN: 6.2500 mg | INTRAMUSCULAR | Status: DC | PRN

## 2020-06-06 MED ORDER — ONDANSETRON HCL 4 MG/2ML IJ SOLN
INTRAMUSCULAR | Status: AC
  Filled 2020-06-06: qty 2

## 2020-06-06 MED ORDER — CHLORHEXIDINE GLUCONATE 0.12 % MT SOLN
OROMUCOSAL | Status: AC
  Filled 2020-06-06: qty 15

## 2020-06-06 SURGICAL SUPPLY — 54 items
APL PRP STRL LF DISP 70% ISPRP (MISCELLANEOUS) ×2
BLADE CLIPPER SURG (BLADE) IMPLANT
CANISTER SUCT 3000ML PPV (MISCELLANEOUS) ×3 IMPLANT
CANISTER WOUNDNEG PRESSURE 500 (CANNISTER) ×2 IMPLANT
CHLORAPREP W/TINT 26 (MISCELLANEOUS) ×5 IMPLANT
COVER SURGICAL LIGHT HANDLE (MISCELLANEOUS) ×3 IMPLANT
COVER WAND RF STERILE (DRAPES) ×1 IMPLANT
DRAPE LAPAROSCOPIC ABDOMINAL (DRAPES) ×3 IMPLANT
DRAPE WARM FLUID 44X44 (DRAPES) ×3 IMPLANT
DRSG OPSITE POSTOP 4X10 (GAUZE/BANDAGES/DRESSINGS) IMPLANT
DRSG OPSITE POSTOP 4X8 (GAUZE/BANDAGES/DRESSINGS) IMPLANT
DRSG VAC ATS LRG SENSATRAC (GAUZE/BANDAGES/DRESSINGS) ×2 IMPLANT
ELECT BLADE 6.5 EXT (BLADE) IMPLANT
ELECT CAUTERY BLADE 6.4 (BLADE) ×3 IMPLANT
ELECT REM PT RETURN 9FT ADLT (ELECTROSURGICAL) ×3
ELECTRODE REM PT RTRN 9FT ADLT (ELECTROSURGICAL) ×1 IMPLANT
GLOVE BIOGEL M STRL SZ7.5 (GLOVE) ×3 IMPLANT
GLOVE INDICATOR 8.0 STRL GRN (GLOVE) ×6 IMPLANT
GLOVE SURG SIGNA 7.5 PF LTX (GLOVE) ×2 IMPLANT
GLOVE SURG SS PI 7.0 STRL IVOR (GLOVE) ×4 IMPLANT
GLOVE SURG SS PI 7.5 STRL IVOR (GLOVE) ×4 IMPLANT
GOWN STRL REUS W/ TWL LRG LVL3 (GOWN DISPOSABLE) ×1 IMPLANT
GOWN STRL REUS W/TWL 2XL LVL3 (GOWN DISPOSABLE) ×3 IMPLANT
GOWN STRL REUS W/TWL LRG LVL3 (GOWN DISPOSABLE) ×3
HANDLE SUCTION POOLE (INSTRUMENTS) ×1 IMPLANT
KIT BASIN OR (CUSTOM PROCEDURE TRAY) ×3 IMPLANT
KIT OSTOMY DRAINABLE 2.75 STR (WOUND CARE) ×2 IMPLANT
KIT TURNOVER KIT B (KITS) ×3 IMPLANT
LIGASURE IMPACT 36 18CM CVD LR (INSTRUMENTS) ×2 IMPLANT
NS IRRIG 1000ML POUR BTL (IV SOLUTION) ×14 IMPLANT
PACK GENERAL/GYN (CUSTOM PROCEDURE TRAY) ×3 IMPLANT
PAD ARMBOARD 7.5X6 YLW CONV (MISCELLANEOUS) ×3 IMPLANT
PENCIL SMOKE EVACUATOR (MISCELLANEOUS) ×3 IMPLANT
SLEEVE SUCTION 125 (MISCELLANEOUS) ×2 IMPLANT
SLEEVE SUCTION CATH 165 (SLEEVE) ×2 IMPLANT
SPECIMEN JAR LARGE (MISCELLANEOUS) IMPLANT
SPONGE LAP 18X18 RF (DISPOSABLE) ×4 IMPLANT
STAPLER CUT CVD 40MM GREEN (STAPLE) ×2 IMPLANT
STAPLER CUT RELOAD GREEN (STAPLE) ×4 IMPLANT
STAPLER VISISTAT 35W (STAPLE) ×3 IMPLANT
SUCTION POOLE HANDLE (INSTRUMENTS) ×3
SUT NOVA 1 T20/GS 25DT (SUTURE) ×10 IMPLANT
SUT PDS AB 1 TP1 96 (SUTURE) ×6 IMPLANT
SUT PROLENE 2 0 CT2 30 (SUTURE) ×4 IMPLANT
SUT SILK 2 0 SH CR/8 (SUTURE) ×3 IMPLANT
SUT SILK 2 0 TIES 10X30 (SUTURE) ×3 IMPLANT
SUT SILK 3 0 SH CR/8 (SUTURE) ×3 IMPLANT
SUT SILK 3 0 TIES 10X30 (SUTURE) ×3 IMPLANT
SUT VIC AB 3-0 SH 18 (SUTURE) ×2 IMPLANT
SUT VIC AB 3-0 SH 8-18 (SUTURE) ×2 IMPLANT
SYR CONTROL 10ML LL (SYRINGE) ×2 IMPLANT
TOWEL GREEN STERILE (TOWEL DISPOSABLE) ×3 IMPLANT
TRAY FOLEY MTR SLVR 16FR STAT (SET/KITS/TRAYS/PACK) ×2 IMPLANT
YANKAUER SUCT BULB TIP NO VENT (SUCTIONS) ×2 IMPLANT

## 2020-06-06 NOTE — Transfer of Care (Signed)
Immediate Anesthesia Transfer of Care Note  Patient: Tonya Hughes  Procedure(s) Performed: EXTENDED LEFT COLECTOMY WITH SIGMOID COLECTOMY COLOSTOMY CREATION/HARTMANN PROCEDURE (N/A Abdomen) EXPLORATORY LAPAROTOMY WITH MOBILIZATION OF SPLENIC FLEXURE (N/A Abdomen) LYSIS OF ADHESION (N/A Abdomen) APPLICATION OF WOUND VAC (N/A Abdomen)  Patient Location: PACU  Anesthesia Type:General  Level of Consciousness: awake, alert  and oriented  Airway & Oxygen Therapy: Patient Spontanous Breathing and Patient connected to face mask oxygen  Post-op Assessment: Report given to RN, Post -op Vital signs reviewed and stable and Patient moving all extremities X 4  Post vital signs: Reviewed and stable  Last Vitals:  Vitals Value Taken Time  BP 141/98 06/06/20 1240  Temp    Pulse 106 06/06/20 1244  Resp 19 06/06/20 1244  SpO2 97 % 06/06/20 1244  Vitals shown include unvalidated device data.  Last Pain:  Vitals:   06/06/20 0726  TempSrc:   PainSc: 0-No pain         Complications: No complications documented.

## 2020-06-06 NOTE — Interval H&P Note (Signed)
History and Physical Interval Note:  06/06/2020 8:20 AM  [name]  has presented today for surgery, with the diagnosis of Colonic Stricture.  The various methods of treatment have been discussed with the patient and family. After consideration of risks, benefits and other options for treatment, the patient has consented to  Procedure(s): COLECTOMY WITH COLOSTOMY CREATION/HARTMANN PROCEDURE (N/A) EXPLORATORY LAPAROTOMY (N/A) as a surgical intervention.  The patient's history has been reviewed, patient examined, no change in status, stable for surgery.  I have reviewed the patient's chart and labs.  Questions were answered to the patient's satisfaction.    Tonya Hughes. Redmond Pulling, MD, FACS General, Bariatric, & Minimally Invasive Surgery [facility]  [facility]

## 2020-06-06 NOTE — Progress Notes (Signed)
PROGRESS NOTE    MARSHAYLA Hughes  LKG:[id number] DOB: April 21, 1951 DOA: 05/31/2020 PCP: Monico Blitz, MD   Brief Narrative:   Tonya Rubel Nanceis an 69 y.o.femalepast medical history of chronic diastolic heart failure, diabetes mellitus DVT essential hypertension status post aortobifemoral bypass on 04/18/2020 recently discharged from the hospital for C. difficile colitis on 05/30/2020 who presents to the hospital with ongoing abdominal pain. During her recent admission she was transfused 2 units of packed red blood cells due to rectal bleeding GI was consulted who perform a flex sig on 05/20/2020 that showed colonic stricture with pathology consistent with ischemic colitis. During this time GI recommended surgical consult which the patient refused at that time. Since discharge she is of been having worsening abdominal pain and diarrhea,she isnowamenable to surgery.  8/5: Now s/p left colectomy w/ colostomy placement. NPO for now. Change PO vanc (she is now refusing PO vanc) to IV flagyl. Appreciate surgery assistance.    Assessment & Plan: Readmitted for abdominal pain due to sigmoid colonic stricture causing partial small bowel obstruction     - Abdominal x-ray done on 06/01/2020 showed partial small bowel obstruction.       - Barium enema demonstrated a long smooth stricture from the descending colon to the rectum on her previous admission.     - she is on TPN     - 8/5: s/p left colectomy w/ colostomy; appreciate surgery assistance  History of of C. Dif. colitis     - She tested positive for C. difficile on 05/17/2020, still having some diarrhea, Flagyl is not optimal for treatment of C. difficile colitis.       - She was being noncompliant with her Flagyl as an outpatient.     - Started on oral vancomycin and will continue total treatment of 10 days.     - diarrhea is improved, follow, maintain precautions     - 8/5: she is refusing PO vanc; will add IV flagyl for now.  Vaginal  candidiasis     - nystatin  Hypovolemic hyponatremia:     - resolved on fluids  Hypokalemia Hypomagnesemia     - K+/Mg2+ replaced. Follow  AKI     - resolved, follow  Hx of PVD status post aortobifemoral bypass:     - aspirin and statins.  Essential hypertension Chronic diastolic heart failure     - lisinopril, coreg     - follow I&O, daily wts  DMt2     - A1c of 6.9.     - SSI; she is on TPN  Left heel unstageable sacral decubitus ulcer present on admission:     - RN Pressure Injury Documentation:     - Pressure Injury 04/27/20 Heel Left Deep Tissue Pressure Injury - Purple or maroon localized area of discolored intact skin or blood-filled blister due to damage of underlying soft tissue from pressure and/or shear. (Active) 04/27/20 2110 Location: Heel Location Orientation: Left Staging: Deep Tissue Pressure Injury - Purple or maroon localized area of discolored intact skin or blood-filled blister due to damage of underlying soft tissue from pressure and/or shear. Wound Description (Comments):  Present on Admission:  DVT prophylaxis: heparin Code Status: DNR Family Communication: None at bedside   Status is: Inpatient  Remains inpatient appropriate because:Inpatient level of care appropriate due to severity of illness   Dispo: The patient is from: Home              Anticipated d/c is to: Home  Anticipated d/c date is: > 3 days              Patient currently is not medically stable to d/c.  Consultants:   General Surgery  Procedures:   Left colectomy; colostomy placement  Antimicrobials:  . Flagyl, zosyn   ROS:  Unable to obtain d/t mentation (post surgery) . Remainder ROS is negative for all not previously mentioned.  Subjective: No acute events ON.  Objective: Vitals:   06/06/20 1355 06/06/20 1425 06/06/20 1455 06/06/20 1529  BP: 138/68 134/75 138/77   Pulse: (!) 106 (!) 106 (!) 103   Resp: 17 14 14 16   Temp:    (!) 97.4 F  (36.3 C)  TempSrc:    Oral  SpO2: 96% 96% 97%   Weight:      Height:        Intake/Output Summary (Last 24 hours) at 06/06/2020 1600 Last data filed at 06/06/2020 1455 Gross per 24 hour  Intake 2400.02 ml  Output 725 ml  Net 1675.02 ml   Filed Weights   06/05/20 0058 06/06/20 0042 06/06/20 0809  Weight: 70.4 kg 69.8 kg 69.8 kg    Examination:  General: 69 y.o. female resting in bed in NAD Respiratory: unlabored, no wheeze heard GI: ND, surgical incision clean, ostomy noted MSK: No e/c/c Skin: No rashes, bruises noted Neuro: groggy after sedation for surgery  Data Reviewed: I have personally reviewed following labs and imaging studies.  CBC: Recent Labs  Lab 05/31/20 2038 06/01/20 1251 06/02/20 0708 06/05/20 0410 06/06/20 0456  WBC 14.4* 8.6 6.1 5.9 5.5  NEUTROABS  --   --   --  3.2 2.6  HGB 12.0 9.7* 8.9* 9.6* 10.1*  HCT 37.8 31.7* 29.2* 30.1* 32.3*  MCV 94.3 96.6 95.4 95.3 96.7  PLT 396 271 271 245 748   Basic Metabolic Panel: Recent Labs  Lab 05/31/20 2038 06/01/20 1251 06/02/20 0708 06/05/20 0410 06/06/20 0456  NA 132* 136 137 137 137  K 2.9* 2.9* 3.7 3.5 5.1  CL 96* 102 104 101 101  CO2 23 25 27 28 29   GLUCOSE 158* 101* 91 138* 152*  BUN 7* 9 9 6* 9  CREATININE 1.14* 1.23* 1.03* 0.70 0.67  CALCIUM 8.4* 7.8* 8.0* 8.1* 8.4*  MG  --  1.6*  --  1.6* 2.2  PHOS  --   --   --  3.4 4.1   GFR: Estimated Creatinine Clearance: 64 mL/min (by C-G formula based on SCr of 0.67 mg/dL). Liver Function Tests: Recent Labs  Lab 05/31/20 2038 06/05/20 0410 06/06/20 0456  AST 21 13* 14*  ALT 12 8 9   ALKPHOS 58 39 40  BILITOT 0.5 0.4 0.6  PROT 7.1 5.5* 6.0*  ALBUMIN 2.6* 2.0* 2.1*   Recent Labs  Lab 05/31/20 2038  LIPASE 31   No results for input(s): AMMONIA in the last 168 hours. Coagulation Profile: No results for input(s): INR, PROTIME in the last 168 hours. Cardiac Enzymes: No results for input(s): CKTOTAL, CKMB, CKMBINDEX, TROPONINI in the last  168 hours. BNP (last 3 results) No results for input(s): PROBNP in the last 8760 hours. HbA1C: No results for input(s): HGBA1C in the last 72 hours. CBG: Recent Labs  Lab 06/05/20 1955 06/06/20 0358 06/06/20 0733 06/06/20 0807 06/06/20 1240  GLUCAP 167* 144* 132* 157* 223*   Lipid Profile: Recent Labs    06/05/20 0410  TRIG 113   Thyroid Function Tests: No results for input(s): TSH, T4TOTAL, FREET4, T3FREE, THYROIDAB in  the last 72 hours. Anemia Panel: No results for input(s): VITAMINB12, FOLATE, FERRITIN, TIBC, IRON, RETICCTPCT in the last 72 hours. Sepsis Labs: Recent Labs  Lab 06/01/20 1251 06/01/20 1637  LATICACIDVEN 1.1 0.9    Recent Results (from the past 240 hour(s))  SARS Coronavirus 2 by RT PCR (hospital order, performed in [facility] hospital lab) Nasopharyngeal Nasopharyngeal Swab     Status: None   Collection Time: 06/01/20 12:17 PM   Specimen: Nasopharyngeal Swab  Result Value Ref Range Status   SARS Coronavirus 2 NEGATIVE NEGATIVE Final    Comment: (NOTE) SARS-CoV-2 target nucleic acids are NOT DETECTED.  The SARS-CoV-2 RNA is generally detectable in upper and lower respiratory specimens during the acute phase of infection. The lowest concentration of SARS-CoV-2 viral copies this assay can detect is 250 copies / mL. A negative result does not preclude SARS-CoV-2 infection and should not be used as the sole basis for treatment or other patient management decisions.  A negative result may occur with improper specimen collection / handling, submission of specimen other than nasopharyngeal swab, presence of viral mutation(s) within the areas targeted by this assay, and inadequate number of viral copies (<250 copies / mL). A negative result must be combined with clinical observations, patient history, and epidemiological information.  Fact Sheet for Patients:   StrictlyIdeas.no  Fact Sheet for Healthcare  Providers: BankingDealers.co.za  This test is not yet approved or  cleared by the [place] FDA and has been authorized for detection and/or diagnosis of SARS-CoV-2 by FDA under an Emergency Use Authorization (EUA).  This EUA will remain in effect (meaning this test can be used) for the duration of the COVID-19 declaration under Section 564(b)(1) of the Act, 21 U.S.C. section 360bbb-3(b)(1), unless the authorization is terminated or revoked sooner.  Performed at [facility], [place] [address] [place], [place] [address]   Culture, blood (routine x 2)     Status: None   Collection Time: 06/01/20 12:51 PM   Specimen: BLOOD  Result Value Ref Range Status   Specimen Description BLOOD RIGHT ANTECUBITAL  Final   Special Requests   Final    BOTTLES DRAWN AEROBIC AND ANAEROBIC Blood Culture adequate volume   Culture   Final    NO GROWTH 5 DAYS Performed at [facility], [place] [address] [place], [place] [address]    Report Status 06/06/2020 FINAL  Final  Culture, blood (routine x 2)     Status: None   Collection Time: 06/01/20  4:37 PM   Specimen: BLOOD  Result Value Ref Range Status   Specimen Description BLOOD RIGHT ANTECUBITAL  Final   Special Requests   Final    BOTTLES DRAWN AEROBIC ONLY Blood Culture adequate volume   Culture   Final    NO GROWTH 5 DAYS Performed at [facility], [place] [address] [place], [place] [address]    Report Status 06/06/2020 FINAL  Final      Radiology Studies: No results found.   Scheduled Meds: . acetaminophen  1,000 mg Oral Q6H  . aspirin EC  81 mg Oral Daily  . atorvastatin  40 mg Oral Daily  . carvedilol  3.125 mg Oral BID WC  . chlorhexidine      . Chlorhexidine Gluconate Cloth  6 each Topical Daily  . feeding supplement  237 mL Oral BID BM  . [START ON 06/07/2020] heparin injection (subcutaneous)  5,000 Units Subcutaneous Q8H  . HYDROmorphone      .  insulin aspart  0-6 Units Subcutaneous Q4H  .  lisinopril  5 mg Oral Daily  . nystatin cream   Topical BID  . PARoxetine  20 mg Oral Daily  . saccharomyces boulardii  250 mg Oral BID  . sodium chloride flush  10-40 mL Intracatheter Q12H  . sodium chloride flush  3 mL Intravenous Once  . sodium chloride flush  3 mL Intravenous Q12H  . vancomycin  125 mg Oral TID AC & HS   Continuous Infusions: . sodium chloride 250 mL (06/05/20 1058)  . sodium chloride 75 mL/hr at 06/06/20 1558  . lactated ringers 10 mL/hr at 06/06/20 0815  . piperacillin-tazobactam 3.375 g (06/06/20 1439)  . TPN ADULT (ION)    . TPN ADULT (ION)       LOS: 5 days    Time spent: 15 minutes spent in the coordination of care today.    Jonnie Finner, DO [facility]  If 7PM-7AM, please contact night-coverage [email or web address] 06/06/2020, 4:00 PM

## 2020-06-06 NOTE — Progress Notes (Signed)
Patient transferred from PACU in bed at 1530 hrs.  Oriented to unit and plan of care for shift.  Husband at bedside.  Wound vac to abdomen and bilateral groins intact. Colostomy intact and small amount of stool noted in bag.  Patient arrived with Foley catheter in place.

## 2020-06-06 NOTE — Progress Notes (Signed)
PHARMACY - TOTAL PARENTERAL NUTRITION CONSULT NOTE   Indication: Small bowel obstruction and Malnutrition  Patient Measurements: Height: 5' 3"  (160 cm) Weight: 69.8 kg (153 lb 14.1 oz) IBW/kg (Calculated) : 52.4 TPN AdjBW (KG): 56.8 Body mass index is 27.26 kg/m.  Assessment: 41 YOF readmitted with abdominal pain and found to have sigmoid colonic stricture causing partial small bowel obstruction. Per surgery team, patient is taking almost no full liquids and only drinking juice. A soft diet has been ordered but surgery thinks that based on patient's history, she will not be able to tolerate solid foods. Pre-albumin today is low at 10.2.   Glucose / Insulin: A1c 6.9,  Glucose 144-167 on vsSSI. 1u insulin in last 24 hours  Electrolytes: K 3.5>5.1, Mg 2.2, CO2 up 29, other lytes wnl, currently on KCl 20 mEq oral daily  Renal: SCr normalized   LFTs / TGs: LFTs wnl, Tbili wnl  Prealbumin / albumin: Pre-albumin 10.2, Alb 2.1 Intake / Output; MIVF:  GI Imaging: Surgeries / Procedures:   Central access: PICC 8/3>> TPN start date: 8/3   Nutritional Goals (Per RD recommendations on 8/3): kCal: 1900-2100, Protein: 90-105g, Fluid:  Goal TPN rate is 80 mL/hr (provides 96 g of protein and 2066 kcals per day)  Current Nutrition:  NPO as of 8/5  Boost TID - refused all doses   Plan:  Continue TPN at goal rate of 46m/hr at 1800 Electrolytes in TPN: 757m/L of Na, Decrease to 2011mL of K, 5mE55m of Ca, 5mEq45mof Mg, and 15mmo76mof Phos. Change Cl:Ac ratio to 2:1 Add standard MVI and trace elements to TPN Switch to Q4h very sensitive SSI and adjust as needed  Monitor TPN labs on Mon/Thurs  Will recommend holding oral potassium supplements while on TPN and repleting K as necessary based on labs for now  Planning for OR today for ex-lap, colon resection w/ ostomy    BenjamAlbertina ParrmD., BCPS, BCCCP Clinical Pharmacist Clinical phone for 06/06/20 until 3:30pm: [phone]  3:30pm, please refer to [facility]

## 2020-06-06 NOTE — Brief Op Note (Addendum)
06/06/2020  12:39 PM  PATIENT:  Tonya Hughes  69 y.o. female  PRE-OPERATIVE DIAGNOSIS:  Descending/Sigmoid Colonic Stricture; h/o aortobifemoral bypass 04/18/2020  POST-OPERATIVE DIAGNOSIS:  Same + contained perforation at rectosigmoid junction  PROCEDURE:  Procedure(s) with comments: EXTENDED LEFT COLECTOMY with END transverse COLOSTOMY CREATION/HARTMANN PROCEDURE (N/A) EXPLORATORY LAPAROTOMY WITH MOBILIZATION OF SPLENIC FLEXURE (N/A) LYSIS OF ADHESION (N/A) - One Hour APPLICATION OF WOUND VAC (N/A)  SURGEON:  Surgeon(s) and Role:    Greer Pickerel, MD - Primary    * Alphonsa Overall, MD - Assisting  PHYSICIAN ASSISTANT:   ASSISTANTS: see above   ANESTHESIA:   general  EBL:  50 mL   BLOOD ADMINISTERED:none  DRAINS: Urinary Catheter (Foley)   LOCAL MEDICATIONS USED:  OTHER exparel  SPECIMEN:  Source of Specimen:  descending colon & sigmoid colon; splenic flexure with distal transverse colon  DISPOSITION OF SPECIMEN:  PATHOLOGY  COUNTS:  YES  TOURNIQUET:  * No tourniquets in log *  DICTATION: Viviann Spare Dictation [phone]  PLAN OF CARE: Admit to inpatient   PATIENT DISPOSITION:  PACU - hemodynamically stable.   Delay start of Pharmacological VTE agent (>24hrs) due to surgical blood loss or risk of bleeding: no  Leighton Ruff. Redmond Pulling, MD, FACS General, Bariatric, & Minimally Invasive Surgery [facility], [place]

## 2020-06-06 NOTE — Progress Notes (Signed)
Educated pt to get out of bed within 4 hours of procedure and walk. Pt refused for now, complaining abdominal pain. Will try again.

## 2020-06-06 NOTE — Plan of Care (Signed)
°  Problem: Education: Goal: Ability to demonstrate management of disease process will improve Outcome: Progressing Goal: Ability to verbalize understanding of medication therapies will improve Outcome: Progressing Goal: Individualized Educational Video(s) Outcome: Progressing   Problem: Activity: Goal: Capacity to carry out activities will improve Outcome: Progressing   Problem: Cardiac: Goal: Ability to achieve and maintain adequate cardiopulmonary perfusion will improve Outcome: Progressing   Problem: Education: Goal: Knowledge of General Education information will improve Description: Including pain rating scale, medication(s)/side effects and non-pharmacologic comfort measures Outcome: Progressing   Problem: Health Behavior/Discharge Planning: Goal: Ability to manage health-related needs will improve Outcome: Progressing   Problem: Clinical Measurements: Goal: Ability to maintain clinical measurements within normal limits will improve Outcome: Progressing Goal: Will remain free from infection Outcome: Progressing Goal: Diagnostic test results will improve Outcome: Progressing Goal: Respiratory complications will improve Outcome: Progressing Goal: Cardiovascular complication will be avoided Outcome: Progressing   Problem: Activity: Goal: Risk for activity intolerance will decrease Outcome: Progressing   Problem: Nutrition: Goal: Adequate nutrition will be maintained Outcome: Progressing   Problem: Coping: Goal: Level of anxiety will decrease Outcome: Progressing   Problem: Elimination: Goal: Will not experience complications related to bowel motility Outcome: Progressing   Problem: Safety: Goal: Ability to remain free from injury will improve Outcome: Progressing   Problem: Skin Integrity: Goal: Risk for impaired skin integrity will decrease Outcome: Progressing

## 2020-06-06 NOTE — Op Note (Signed)
NAME: Tonya Hughes, Tonya Hughes MEDICAL RECORD DE:[id number] ACCOUNT [id number] DATE OF BIRTH:[birth year] FACILITY: [facility] LOCATION: [id number] PHYSICIAN:Shanavia Makela Ronnie Derby, MD  OPERATIVE REPORT  DATE OF PROCEDURE:  06/06/2020  PREOPERATIVE DIAGNOSES:   1.  Descending/sigmoid colonic stricture. 2.  History of aortobifemoral bypass, 04/18/2020.  POSTOPERATIVE DIAGNOSES: 1.  Descending/sigmoid colonic stricture due to ischemic colitis. 2.  History of aortobifemoral bypass, 04/18/2020. 3.  Contained perforation of the rectosigmoid junction.  PROCEDURE: 1.  Exploratory laparotomy with lysis of adhesions for 1 hour. 2.  Extended left colectomy with end colostomy of the mid transverse colon (Hartmann's procedure). 3.  Application of wound VAC (>10 cm2)  SURGEON:  Greer Pickerel, MD  ASSISTANT:  Alphonsa Overall, MD (an assistant was needed to assist with mobilization of dense scar tissue in pelvis)  ANESTHESIA:  General.  ESTIMATED BLOOD LOSS:  50 mL.  SPECIMENS: 1.  Descending colon and sigmoid colon. 2.  Splenic flexure with distal transverse colon.  FINDINGS:  The patient had omental adhesions to the abdominal wall.  She also had extensive interloop small-bowel adhesions.  The most challenging adhesions were the small-bowel in her pelvis.  She also had what appeared to be a contained perforation at  her rectosigmoid junction.  There was no purulence.  The rectosigmoid junction was just densely adhered to the left pelvic sidewall and on the contralateral side about 180 degrees, there was a section of small bowel that was also adhered to this  location.  This was finger fractured, resulting in about 15 mL of liquid stool.  The defect in the colon was sutured with several silk sutures.  There was no frank ischemia or infarction to the left colon and sigmoid colon, but there was a clear  difference in the caliber and you could tell the difference between where the stricture started and where it ended. Did not  visualize the prosthetic graft.   INDICATIONS:  The patient is a 69 year old female who underwent extensive vascular surgery a little over 6 weeks ago.  She had an aortobifemoral bypass, lysis of adhesions, arthrectomy.  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   Postoperatively, she developed ischemic colitis.  Unfortunately, she developed a colonic stricture starting at her descending colon, going to her rectosigmoid junction.  She continued to have liquid bowel movements, but could not tolerate anything  greater than a full liquid diet.  Moreover, the patient developed C. diff colitis and was somewhat noncompliant in her outpatient regimen and got readmitted.  The patient did not tolerate/did not like the full liquid diet and requested a soft diet, but  could not tolerate it.  At this point, we gave her 2 options, which would remain on a full liquid diet for several months until she was recovered from her major surgery in order to undergo colectomy with possibility of anastomosis and possible diverting  loop ileostomy versus proceeding with resection during this admission.  We did discuss that depending on intraoperative findings, we may have to alter the surgical plan.  We discussed the possibility of a loop colostomy or loop ileostomy or just  transecting the colon and bring out an end ostomy depending on how dense adhesions may be.  I had a long conversation with the patient and her husband regarding risks and benefits which are separately documented.  DESCRIPTION OF PROCEDURE:  The patient was brought to OR #1 at [facility], placed supine on the operating room table.  General endotracheal anesthesia was established.  Sequential  compression devices were placed.  A Foley catheter was placed.   She received 1 dose of a preoperative antibiotic.  Surgical timeout was performed.  I incised her old midline incision, starting several inches above the umbilicus, extending down  below the umbilicus.  Subcutaneous tissue was divided with electrocautery.   The fascia was incised.  The peritoneum was grasped with Kochers and gently lifted and a pair of Truett Mainland was used to enter the abdominal cavity.  There were a fair amount of omental adhesions off to the right side of the patient's abdomen.  The left side  was not as densely adhered.  My assistant & I took down these adhesions with a combination of blunt dissection, Metzenbaum scissors, as well as Bovie electrocautery.  We then placed the Bookwalter retractor.  There were small-bowel adhesions to the transverse colon.   Most of these adhesions came down with finger fracture, along with Metzenbaum scissors.  There were extensive small-bowel adhesions into the pelvis, probably from her prior hysterectomy.  I ended up deciding to mobilize the transverse colon.  We first  identified the descending colon.  We identified the area of stricture in the descending colon where it change from normal caliber just to a chronically thickened descending colon, all the way down to the rectosigmoid junction.  However, there was a  section of small bowel adhered to the rectosigmoid junction.  We decided to enter the lesser sac with LigaSure.  I entered the lesser sac and took down some short gastrics and mobilized the transverse colon toward the splenic flexure. My assistant retracted tissue while I was mobilizing the splenic flexure.  I then started  mobilizing the descending colon, taking down the lateral attachments with Bovie electrocautery, as well as with LigaSure device.  We continued to mobilize up the descending colon proximally toward the splenic flexure, both from the transverse colon  aspect as well as from the descending colon.  I was able to come around the splenic flexure and take it down sequentially with the LigaSure device.  The spleen was identified and protected.  I felt that we could proceed with resecting the diseased part  of the  colon, so I think this would be a definitive procedure for her.  I ended up dividing the colon at the descending part with a green load of a Contour stapler.  We then took down the mesentery just next to the colon sequentially with LigaSure  device.  As stated before, the rectosigmoid junction was adhered to the left pelvic sidewall.  I finger fractured this and this resulted in some spillage of liquid stool.  It appeared that she had a contained perforation.  We were able to tease off the  small bowel that was adhered on the contralateral side of that section of the rectosigmoid.  It was freed.  My assistant & I ended up then mobilizing the small bowel out of the pelvis with a pair of Metzenbaum scissors.  The small bowel was completely delivered out of  the pelvis.  I ended up identifying a normal section of upper rectum and this was divided with a green load of the Contour stapler.  I then completely continued to take down the mesentery next to the sigmoid colon and upper rectum and passed off the  specimen.  The peritoneum in the pelvis was just a little bit raw and oozy.  We irrigated the abdomen at this point with 4 L of saline.  The splenic flexure had  been taken down and mobilized.  It did not have good blood flow to it  at this point, so we  decided that she would have to have end mid-transverse colostomy.  I ended up resecting and stapling off the nonviable distal transverse colon with another fire of the green load Contour.  This left a nice healthy, well-perfused mid transverse colon to bring  out as an end colostomy.  I ended up mobilizing some additional omentum off the proximal transverse colon in order for it to more easily reach the left upper quadrant premarked ostomy site.  At this point, I tagged the rectal stump with 2 interrupted  Prolene sutures.  The abdomen was irrigated with antibiotic irrigation.  We then went about creating a skin defect in the left upper quadrant where she had  been marked for an ostomy.  A circular skin defect was made with electrocautery.  The anterior  fascia was incised in a cruciate fashion with cautery, then using a Kelly spread the rectus and then incised the peritoneum.  We then delivered the transverse colon out through the left upper quadrant, ensuring it was not twisted.  We then infiltrated  Exparel along the fascia on either side.  The abdomen was then closed with multiple interrupted Novafil sutures, followed by application of a black wound VAC sponge,  followed by placement of plastic drapes and had a good seal.  It was more than 10 cm2.  I then transected the staple line off the end colostomy, matured it in a Brooke fashion with multiple interrupted 3-0 Vicryl sutures, followed by ostomy appliance device.  All needle, instrument and sponge counts were correct x2.  There were no immediate  complications.  The patient tolerated the procedure well.  VN/NUANCE  D:06/06/2020 T:06/06/2020 JOB:[id number]

## 2020-06-06 NOTE — Plan of Care (Signed)
  Problem: Education: Goal: Ability to demonstrate management of disease process will improve 06/06/2020 0833 by Arlina Robes, RN Outcome: Progressing 06/06/2020 0831 by Arlina Robes, RN Outcome: Progressing Goal: Ability to verbalize understanding of medication therapies will improve 06/06/2020 0833 by Arlina Robes, RN Outcome: Progressing 06/06/2020 0831 by Arlina Robes, RN Outcome: Progressing Goal: Individualized Educational Video(s) 06/06/2020 0833 by Arlina Robes, RN Outcome: Progressing 06/06/2020 0831 by Arlina Robes, RN Outcome: Progressing   Problem: Activity: Goal: Capacity to carry out activities will improve 06/06/2020 0833 by Arlina Robes, RN Outcome: Progressing 06/06/2020 0831 by Arlina Robes, RN Outcome: Progressing   Problem: Cardiac: Goal: Ability to achieve and maintain adequate cardiopulmonary perfusion will improve 06/06/2020 0833 by Arlina Robes, RN Outcome: Progressing 06/06/2020 0831 by Arlina Robes, RN Outcome: Progressing   Problem: Education: Goal: Knowledge of General Education information will improve Description: Including pain rating scale, medication(s)/side effects and non-pharmacologic comfort measures 06/06/2020 0833 by Arlina Robes, RN Outcome: Progressing 06/06/2020 0831 by Arlina Robes, RN Outcome: Progressing   Problem: Health Behavior/Discharge Planning: Goal: Ability to manage health-related needs will improve 06/06/2020 0833 by Arlina Robes, RN Outcome: Progressing 06/06/2020 0831 by Arlina Robes, RN Outcome: Progressing   Problem: Clinical Measurements: Goal: Ability to maintain clinical measurements within normal limits will improve 06/06/2020 0833 by Arlina Robes, RN Outcome: Progressing 06/06/2020 0831 by Arlina Robes, RN Outcome: Progressing Goal: Will remain free from infection 06/06/2020 0833 by Arlina Robes, RN Outcome: Progressing 06/06/2020 0831  by Arlina Robes, RN Outcome: Progressing Goal: Diagnostic test results will improve 06/06/2020 0833 by Arlina Robes, RN Outcome: Progressing 06/06/2020 0831 by Arlina Robes, RN Outcome: Progressing Goal: Respiratory complications will improve 06/06/2020 0833 by Arlina Robes, RN Outcome: Progressing 06/06/2020 0831 by Arlina Robes, RN Outcome: Progressing Goal: Cardiovascular complication will be avoided 06/06/2020 0833 by Arlina Robes, RN Outcome: Progressing 06/06/2020 0831 by Arlina Robes, RN Outcome: Progressing   Problem: Activity: Goal: Risk for activity intolerance will decrease 06/06/2020 0833 by Arlina Robes, RN Outcome: Progressing 06/06/2020 0831 by Arlina Robes, RN Outcome: Progressing   Problem: Nutrition: Goal: Adequate nutrition will be maintained 06/06/2020 0833 by Arlina Robes, RN Outcome: Progressing 06/06/2020 0831 by Arlina Robes, RN Outcome: Progressing   Problem: Coping: Goal: Level of anxiety will decrease 06/06/2020 0833 by Arlina Robes, RN Outcome: Progressing 06/06/2020 0831 by Arlina Robes, RN Outcome: Progressing   Problem: Elimination: Goal: Will not experience complications related to bowel motility 06/06/2020 0833 by Arlina Robes, RN Outcome: Progressing 06/06/2020 0831 by Arlina Robes, RN Outcome: Progressing   Problem: Safety: Goal: Ability to remain free from injury will improve 06/06/2020 0833 by Arlina Robes, RN Outcome: Progressing 06/06/2020 0831 by Arlina Robes, RN Outcome: Progressing   Problem: Skin Integrity: Goal: Risk for impaired skin integrity will decrease 06/06/2020 0833 by Arlina Robes, RN Outcome: Progressing 06/06/2020 0831 by Arlina Robes, RN Outcome: Progressing   Problem: Education: Goal: Ability to demonstrate management of disease process will improve Outcome: Progressing Goal: Ability to verbalize understanding of  medication therapies will improve Outcome: Progressing Goal: Individualized Educational Video(s) Outcome: Progressing   Problem: Activity: Goal: Capacity to carry out activities will improve Outcome: Progressing   Problem: Cardiac: Goal: Ability to achieve and maintain adequate cardiopulmonary perfusion will improve Outcome: Progressing

## 2020-06-06 NOTE — Anesthesia Preprocedure Evaluation (Addendum)
Anesthesia Evaluation  Patient identified by MRN, date of birth, ID band Patient awake    Reviewed: Allergy & Precautions, NPO status , Patient's Chart, lab work & pertinent test results, reviewed documented beta blocker date and time   Airway Mallampati: II  TM Distance: <3 FB Neck ROM: Full    Dental  (+) Edentulous Upper, Dental Advisory Given   Pulmonary former smoker,     + decreased breath sounds      Cardiovascular hypertension, Pt. on home beta blockers and Pt. on medications + Peripheral Vascular Disease and +CHF   Rhythm:Regular Rate:Normal     Neuro/Psych PSYCHIATRIC DISORDERS Depression  Neuromuscular disease    GI/Hepatic Neg liver ROS, GERD  ,  Endo/Other  diabetes  Renal/GU negative Renal ROS     Musculoskeletal  (+) Arthritis ,   Abdominal Normal abdominal exam  (+)   Peds  Hematology negative hematology ROS (+)   Anesthesia Other Findings   Reproductive/Obstetrics                            Anesthesia Physical Anesthesia Plan  ASA: III  Anesthesia Plan: General   Post-op Pain Management:    Induction: Intravenous  PONV Risk Score and Plan: 4 or greater and Ondansetron, Dexamethasone, Midazolam and Treatment may vary due to age or medical condition  Airway Management Planned: Oral ETT  Additional Equipment: None  Intra-op Plan:   Post-operative Plan: Extubation in OR  Informed Consent: I have reviewed the patients History and Physical, chart, labs and discussed the procedure including the risks, benefits and alternatives for the proposed anesthesia with the patient or authorized representative who has indicated his/her understanding and acceptance.   Patient has DNR.  Suspend DNR.   Dental advisory given  Plan Discussed with: CRNA  Anesthesia Plan Comments:        Anesthesia Quick Evaluation

## 2020-06-06 NOTE — Anesthesia Procedure Notes (Signed)
Procedure Name: Intubation Date/Time: 06/06/2020 8:55 AM Performed by: Mariea Clonts, CRNA Pre-anesthesia Checklist: Patient identified, Emergency Drugs available, Suction available and Patient being monitored Patient Re-evaluated:Patient Re-evaluated prior to induction Oxygen Delivery Method: Circle System Utilized Preoxygenation: Pre-oxygenation with 100% oxygen Induction Type: IV induction Ventilation: Mask ventilation without difficulty Laryngoscope Size: Miller and 2 Grade View: Grade II Tube type: Oral Tube size: 7.0 mm Number of attempts: 1 Airway Equipment and Method: Stylet and Oral airway Placement Confirmation: ETT inserted through vocal cords under direct vision,  positive ETCO2 and breath sounds checked- equal and bilateral Tube secured with: Tape Dental Injury: Teeth and Oropharynx as per pre-operative assessment

## 2020-06-06 NOTE — Progress Notes (Signed)
PT Cancellation Note  Patient Details Name: Tonya Hughes MRN: [id number] DOB: [birth year]   Cancelled Treatment:    Reason Eval/Treat Not Completed: Patient at procedure or test/unavailable (OR).    Wyona Almas, PT, DPT Acute Rehabilitation Services Pager [phone] Office [phone]    Deno Etienne 06/06/2020, 8:32 AM

## 2020-06-06 NOTE — Progress Notes (Signed)
Patient taken down to short stay surgical holding area,she will not be returning to our floor she is to go to [place] they are holding a bed for her. Patient's effects will be bagged up and sent to [facility] for her. Report given to surgical holding Rn.

## 2020-06-07 ENCOUNTER — Encounter (HOSPITAL_COMMUNITY): Payer: Self-pay | Admitting: General Surgery

## 2020-06-07 ENCOUNTER — Other Ambulatory Visit: Payer: Self-pay

## 2020-06-07 LAB — BASIC METABOLIC PANEL
Anion gap: 6 (ref 5–15)
BUN: 20 mg/dL (ref 8–23)
CO2: 24 mmol/L (ref 22–32)
Calcium: 8 mg/dL — ABNORMAL LOW (ref 8.9–10.3)
Chloride: 105 mmol/L (ref 98–111)
Creatinine, Ser: 0.66 mg/dL (ref 0.44–1.00)
GFR calc Af Amer: >60 mL/min (ref >60)
GFR calc non Af Amer: >60 mL/min (ref >60)
Glucose, Bld: 232 mg/dL — ABNORMAL HIGH (ref 70–99)
Potassium: 4.2 mmol/L (ref 3.5–5.1)
Sodium: 135 mmol/L (ref 135–145)

## 2020-06-07 LAB — CBC
HCT: 30.8 % — ABNORMAL LOW (ref 36.0–46.0)
Hemoglobin: 9.4 g/dL — ABNORMAL LOW (ref 12.0–15.0)
MCH: 29.6 pg (ref 26.0–34.0)
MCHC: 30.5 g/dL (ref 30.0–36.0)
MCV: 96.9 fL (ref 80.0–100.0)
Platelets: 217 10*3/uL (ref 150–400)
RBC: 3.18 MIL/uL — ABNORMAL LOW (ref 3.87–5.11)
RDW: 16.7 % — ABNORMAL HIGH (ref 11.5–15.5)
WBC: 9.2 10*3/uL (ref 4.0–10.5)
nRBC: 0 % (ref 0.0–0.2)

## 2020-06-07 LAB — GLUCOSE, CAPILLARY
Glucose-Capillary: 180 mg/dL — ABNORMAL HIGH (ref 70–99)
Glucose-Capillary: 182 mg/dL — ABNORMAL HIGH (ref 70–99)
Glucose-Capillary: 194 mg/dL — ABNORMAL HIGH (ref 70–99)
Glucose-Capillary: 203 mg/dL — ABNORMAL HIGH (ref 70–99)
Glucose-Capillary: 249 mg/dL — ABNORMAL HIGH (ref 70–99)
Glucose-Capillary: 308 mg/dL — ABNORMAL HIGH (ref 70–99)

## 2020-06-07 LAB — MAGNESIUM: Magnesium: 1.8 mg/dL (ref 1.7–2.4)

## 2020-06-07 LAB — PHOSPHORUS: Phosphorus: 3.4 mg/dL (ref 2.5–4.6)

## 2020-06-07 MED ORDER — TRAVASOL 10 % IV SOLN
INTRAVENOUS | Status: AC
  Filled 2020-06-07: qty 960

## 2020-06-07 MED ORDER — INSULIN ASPART 100 UNIT/ML ~~LOC~~ SOLN: 0.0000 [IU] | Freq: Every day | SUBCUTANEOUS | Status: DC

## 2020-06-07 MED ORDER — INSULIN ASPART 100 UNIT/ML ~~LOC~~ SOLN
0.0000 [IU] | SUBCUTANEOUS | Status: DC
  Administered 2020-06-07: 3 [IU] via SUBCUTANEOUS
  Administered 2020-06-07: 2 [IU] via SUBCUTANEOUS

## 2020-06-07 MED ORDER — VANCOMYCIN 50 MG/ML ORAL SOLUTION
125.0000 mg | Freq: Four times a day (QID) | ORAL | Status: DC
  Administered 2020-06-07 – 2020-06-14 (×27): 125 mg via ORAL
  Filled 2020-06-07 (×31): qty 2.5

## 2020-06-07 MED ORDER — METHOCARBAMOL 500 MG PO TABS
500.0000 mg | ORAL_TABLET | Freq: Three times a day (TID) | ORAL | Status: DC
  Administered 2020-06-07 – 2020-06-14 (×22): 500 mg via ORAL
  Filled 2020-06-07 (×22): qty 1

## 2020-06-07 MED ORDER — INSULIN ASPART 100 UNIT/ML ~~LOC~~ SOLN
0.0000 [IU] | Freq: Three times a day (TID) | SUBCUTANEOUS | Status: DC
  Administered 2020-06-07: 3 [IU] via SUBCUTANEOUS

## 2020-06-07 NOTE — NC FL2 (Signed)
[place] MEDICAID FL2 LEVEL OF CARE SCREENING TOOL     IDENTIFICATION  Patient Name: Tonya Hughes Birthdate: [birth year] Sex: female Admission Date (Current Location): 05/31/2020  [facility] and [place] Number:  Herbalist and Address:  [facility] [place], [place] [address] [place], [place] [address]      Provider Number: [id number]  Attending Physician Name and Address:  Jonnie Finner, DO  Relative Name and Phone Number:  Carlos American [phone]    Current Level of Care: Hospital Recommended Level of Care: [place] Prior Approval Number:    Date Approved/Denied:   PASRR Number: [id number]  Discharge Plan: SNF    Current Diagnoses: Patient Active Problem List   Diagnosis Date Noted  . Malnutrition of moderate degree 06/05/2020  . Hypokalemia 06/01/2020  . Abdominal pain 05/17/2020  . Hyponatremia 05/17/2020  . Diarrhea 05/17/2020  . C. difficile colitis 05/17/2020  . Hemorrhagic shock ([place])   . Peripheral arterial occlusive disease ([place]) 04/18/2020  . Shock ([place]) 04/18/2020  . Hypertension   . Hypercholesteremia   . GERD (gastroesophageal reflux disease)   . Depressive disorder   . Congestive heart failure (CHF) ([place])   . Back pain   . Acute deep vein thrombosis (DVT) of proximal vein of right lower extremity ([place]) 08/27/2017  . Closed fracture of lumbar vertebra ([place]) 05/31/2017  . S/P lumbar fusion 04/21/2017  . Lumbar radiculopathy 02/25/2017    Orientation RESPIRATION BLADDER Height & Weight     Self, Time, Situation, Place  Normal Incontinent, External catheter (External Urinary Catheter) Weight: 157 lb 6.5 oz (71.4 kg) Height:  5' 3"  (160 cm)  BEHAVIORAL SYMPTOMS/MOOD NEUROLOGICAL BOWEL NUTRITION STATUS      Colostomy, Incontinent (1 piece convex pouch) Diet (See Discharge Summary)  AMBULATORY STATUS COMMUNICATION OF NEEDS Skin   Limited Assist Verbally Surgical wounds, Other (Comment), Wound Vac  (DermatitisButtocks;GroinBil.l barrier cream,EcchymosisAbdomenarm bil;PI heel L deeptissueFoamdressingassessPRN,IncisionclosedAbdomendressing in place,Incision closed Groinother m,w,fri,neg.press. woundtherapy abdomen mid,neg.press.woundtherapy groin bil.)                       Personal Care Assistance Level of Assistance  Bathing, Feeding, Dressing Bathing Assistance: Limited assistance Feeding assistance: Independent Dressing Assistance: Limited assistance     Functional Limitations Info  Sight, Hearing, Speech Sight Info: Impaired Hearing Info: Impaired Speech Info: Adequate    SPECIAL CARE FACTORS FREQUENCY  PT (By licensed PT), OT (By licensed OT)     PT Frequency: 5x min weekly OT Frequency: 5x min weekly            Contractures Contractures Info: Not present    Additional Factors Info  Code Status, Allergies, Insulin Sliding Scale, Psychotropic Code Status Info: DNR Allergies Info: Zanaflex (tizanidine Hcl) ,Penicillins Psychotropic Info: PARoxetine (PAXIL) tablet 20 mg daily PO Insulin Sliding Scale Info: insulin aspart (novoLOG) injection 0-15 Units 3x daily with meals;insulin aspart (novoLOG) injection 0-5 Units daily at bedtime       Current Medications (06/07/2020):  This is the current hospital active medication list Current Facility-Administered Medications  Medication Dose Route Frequency Provider Last Rate Last Admin  . 0.9 %  sodium chloride infusion   Intravenous PRN Jillyn Ledger, PA-C 10 mL/hr at 06/05/20 1058 250 mL at 06/05/20 1058  . 0.9 %  sodium chloride infusion   Intravenous Continuous Jillyn Ledger, PA-C 75 mL/hr at 06/07/20 1221 New Bag at 06/07/20 1221  . acetaminophen (TYLENOL) tablet 1,000 mg  1,000 mg  Oral Q6H Jillyn Ledger, PA-C   1,000 mg at 06/07/20 1340  . aspirin EC tablet 81 mg  81 mg Oral Daily Jillyn Ledger, PA-C   81 mg at 06/07/20 1610  . atorvastatin (LIPITOR) tablet 40 mg  40 mg Oral Daily Jillyn Ledger,  PA-C   40 mg at 06/07/20 9604  . carvedilol (COREG) tablet 3.125 mg  3.125 mg Oral BID WC Jillyn Ledger, PA-C   3.125 mg at 06/07/20 1625  . Chlorhexidine Gluconate Cloth 2 % PADS 6 each  6 each Topical Daily Jillyn Ledger, PA-C   6 each at 06/07/20 1008  . diphenhydrAMINE (BENADRYL) 12.5 MG/5ML elixir 12.5 mg  12.5 mg Oral Q6H PRN Maczis, Barth Kirks, PA-C       Or  . diphenhydrAMINE (BENADRYL) injection 12.5 mg  12.5 mg Intravenous Q6H PRN Maczis, Barth Kirks, PA-C      . feeding supplement (ENSURE SURGERY) liquid 237 mL  237 mL Oral BID BM Maczis, Barth Kirks, PA-C      . heparin injection 5,000 Units  5,000 Units Subcutaneous Q8H Jillyn Ledger, PA-C   5,000 Units at 06/07/20 1338  . insulin aspart (novoLOG) injection 0-15 Units  0-15 Units Subcutaneous TID WC Kyle, Tyrone A, DO      . insulin aspart (novoLOG) injection 0-5 Units  0-5 Units Subcutaneous QHS Kyle, Tyrone A, DO      . ipratropium-albuterol (DUONEB) 0.5-2.5 (3) MG/3ML nebulizer solution 3 mL  3 mL Nebulization Q6H PRN Maczis, Barth Kirks, PA-C      . lactated ringers infusion   Intravenous Continuous Jillyn Ledger, PA-C 10 mL/hr at 06/06/20 0815 Restarted at 06/06/20 0844  . lisinopril (ZESTRIL) tablet 5 mg  5 mg Oral Daily Jillyn Ledger, PA-C   5 mg at 06/07/20 5409  . methocarbamol (ROBAXIN) tablet 500 mg  500 mg Oral TID Jillyn Ledger, PA-C   500 mg at 06/07/20 1625  . metoprolol tartrate (LOPRESSOR) injection 5 mg  5 mg Intravenous Q6H PRN Maczis, Barth Kirks, PA-C      . morphine 2 MG/ML injection 2 mg  2 mg Intravenous Q2H PRN Jillyn Ledger, PA-C   2 mg at 06/07/20 1004  . nystatin cream (MYCOSTATIN)   Topical BID Jillyn Ledger, PA-C   Given at 06/07/20 0845  . ondansetron (ZOFRAN) tablet 4 mg  4 mg Oral Q6H PRN Maczis, Barth Kirks, PA-C       Or  . ondansetron [facility]) injection 4 mg  4 mg Intravenous Q6H PRN Maczis, Barth Kirks, PA-C      . oxyCODONE (Oxy IR/ROXICODONE) immediate release tablet 5-10 mg   5-10 mg Oral Q4H PRN Jillyn Ledger, PA-C   10 mg at 06/07/20 8119  . PARoxetine (PAXIL) tablet 20 mg  20 mg Oral Daily Jillyn Ledger, PA-C   20 mg at 06/07/20 1478  . piperacillin-tazobactam (ZOSYN) IVPB 3.375 g  3.375 g Intravenous Q8H Jillyn Ledger, PA-C 100 mL/hr at 06/07/20 1335 3.375 g at 06/07/20 1335  . saccharomyces boulardii (FLORASTOR) capsule 250 mg  250 mg Oral BID Jillyn Ledger, PA-C   250 mg at 06/07/20 2956  . simethicone (MYLICON) chewable tablet 40 mg  40 mg Oral Q6H PRN Maczis, Barth Kirks, PA-C      . sodium chloride flush (NS) 0.9 % injection 10-40 mL  10-40 mL Intracatheter Q12H Jillyn Ledger, PA-C   10 mL at 06/06/20 2152  .  sodium chloride flush (NS) 0.9 % injection 10-40 mL  10-40 mL Intracatheter PRN Jillyn Ledger, PA-C   10 mL at 06/04/20 2125  . sodium chloride flush (NS) 0.9 % injection 3 mL  3 mL Intravenous Once Maczis, Barth Kirks, PA-C      . sodium chloride flush (NS) 0.9 % injection 3 mL  3 mL Intravenous Q12H Jillyn Ledger, PA-C   3 mL at 06/07/20 1001  . TPN ADULT (ION)   Intravenous Continuous TPN Jillyn Ledger, PA-C 80 mL/hr at 06/06/20 1806 New Bag at 06/06/20 1806  . TPN ADULT (ION)   Intravenous Continuous TPN Mancheril, Darnell Level, RPH      . vancomycin (VANCOCIN) 50 mg/mL oral solution 125 mg  125 mg Oral Q6H Kyle, Tyrone A, DO   125 mg at 06/07/20 1216     Discharge Medications: Please see discharge summary for a list of discharge medications.  Relevant Imaging Results:  Relevant Lab Results:   Additional Information [place] Keiaira Donlan, LCSWA

## 2020-06-07 NOTE — TOC Progression Note (Signed)
Transition of Care [facility]) - Progression Note    Patient Details  Name: Tonya Hughes MRN: [id number] Date of Birth: 09-15-1951  Transition of Care [facility]) CM/SW [place], [place] Phone Number: 06/07/2020, 5:32 PM  Clinical Narrative:     CSW started insurance authorization for patient. Start date is for 8/8. Reference number is #[id number]. CSW faxed over clinicals for review.  Pending bed offers. Pending insurance authorization.  TOC team will continue to follow.    Expected Discharge Plan: [place] Barriers to Discharge: Continued Medical Work up  Expected Discharge Plan and Services Expected Discharge Plan: [place] arrangements for the past 2 months: Single Family Home                                       Social Determinants of Health (SDOH) Interventions    Readmission Risk Interventions No flowsheet data found.

## 2020-06-07 NOTE — Consult Note (Addendum)
[place] Nurse Consult Note: Reason for Consult: Midline abdominal wound with NPWT in place.  Wound type:surgical wound Pressure Injury POA: NA Measurement: 15 cm x 7 cm x 6 cm  Wound QPR:FFMB pink Drainage (amount, consistency, odor) moderate serosanguinous  No odor.   Periwound:LUQ colostomy Dressing procedure/placement/frequency: Cleanse wound with NS and pat dry.  Apply 2 pieces black foam. Cover with drape.  Seal immediately achieved at 125 MmHg. Change Mon/Wed/Fri.   Due to decrease in dimensions and no output in canister, I am discontinuing NPWT to bilateral inguinal wounds.   Measurements today:  LEft:  2 cm x 1 cm x 0.2 cm  Right 2 cm x 1.5 cm x 0.2 cm Scant serosanguinous   Will implement daily NS moist gauze dressings.  [place] Nurse ostomy consult note Stoma type/location:  LUQ colostomy Stomal assessment/size: 1" pale pink productive brown stool Peristomal assessment: midline abdominal incision Treatment options for stomal/peristomal skin: barrier ring and 1 piece convex pouch Output soft brown stool Ostomy pouching: 1pc convex pouch.  Education provided: patient not able to participate.  Enrolled patient in [place] program: No Will follow.  Domenic Moras MSN, RN, FNP-BC CWON Wound, Ostomy, Continence Nurse Pager [phone]

## 2020-06-07 NOTE — Progress Notes (Addendum)
PT Cancellation Note  Patient Details Name: Tonya Hughes MRN: [id number] DOB: 23-Apr-1951   Cancelled Treatment:    Reason Eval/Treat Not Completed: Pain limiting ability to participate. Pt adamantly refusing OOB/participation in therapy, stating she was in too much pain. RN present in room to administer pain meds. Educated pt on importance of mobility but she continued to decline, becoming mildly agitated.  1150 addendum: Re-attempted PT. Pt still declining, stating pain as factor.    Lorriane Shire 06/07/2020, 8:59 AM  Lorrin Goodell, PT  Office # [phone] Pager [phone]

## 2020-06-07 NOTE — Progress Notes (Signed)
Pt still in pain and refused to ambulate.

## 2020-06-07 NOTE — Progress Notes (Signed)
OT Cancellation Note  Patient Details Name: Tonya Hughes MRN: [id number] DOB: 02/13/1951   Cancelled Treatment:    Reason Eval/Treat Not Completed: Other (comment) (Pt refused this morning 2/2 to pain and stating "why dont you understand just leave me alone")  Zenovia Jarred, MSOT, OTR/L [facility] Office Number: [phone] Pager: [phone]  Zenovia Jarred 06/07/2020, 10:27 AM

## 2020-06-07 NOTE — Progress Notes (Signed)
PHARMACY - TOTAL PARENTERAL NUTRITION CONSULT NOTE   Indication: Small bowel obstruction and Malnutrition  Patient Measurements: Height: 5' 3"  (160 cm) Weight: 71.4 kg (157 lb 6.5 oz) IBW/kg (Calculated) : 52.4 TPN AdjBW (KG): 56.8 Body mass index is 27.88 kg/m.  Assessment: 14 YOF readmitted with abdominal pain and found to have sigmoid colonic stricture causing partial small bowel obstruction. Per surgery team, patient is taking almost no full liquids and only drinking juice. A soft diet has been ordered but surgery thinks that based on patient's history, she will not be able to tolerate solid foods. Pre-albumin today is low at 10.2.   Glucose / Insulin: A1c 6.9, Patient received one dose of dexamethasone 5 mg on 8/5. CBGs trended up to 223-315 on vsSSI. 14u insulin in last 24 hours  Electrolytes: K 5.1>4.2, Mg 1.8, other lytes wnl, Home oral potassium supplements d/c'ed  Renal: SCr normalized   LFTs / TGs: LFTs wnl, Tbili wnl  Prealbumin / albumin: Pre-albumin 10.2, Alb 2.1 Intake / Output; MIVF: UOP 0.4 mL/kg/hr  GI Imaging: Surgeries / Procedures:  8/5: Extended left colectomy + sigmoid colectomy and placement of colostomy  Central access: PICC 8/3>> TPN start date: 8/3   Nutritional Goals (Per RD recommendations on 8/3): kCal: 1900-2100, Protein: 90-105g, Fluid:  Goal TPN rate is 80 mL/hr (provides 96 g of protein and 2066 kcals per day)  Current Nutrition:  NPO as of 8/5  Boost TID - refused all doses  TPN   Plan:  Continue TPN at goal rate of 76m/hr at 1800 Electrolytes in TPN: 731m/L of Na, Increase to 3039mL of K, 5mE60m of Ca, increase to 10mE16mof Mg, and 15mmo48mof Phos. Change Cl:Ac ratio to 1:1 Add standard MVI and trace elements to TPN Switch to Q4h very sensitive SSI and adjust as needed  Monitor TPN labs on Mon/Thurs  Planning for OR today for ex-lap, colon resection w/ ostomy    BenjamAlbertina ParrmD., BCPS, BCCCP Clinical  Pharmacist Clinical phone for 06/07/20 until 3:30pm: [phone] 3:30pm, please refer to [facility]

## 2020-06-07 NOTE — Progress Notes (Addendum)
1 Day Post-Op  Subjective: CC: Patient having 7/10 generalized abdominal pain that is worst around the incision. Has not gotten out of bed. Foley in place. No n/v. Colostomy functioning.   Objective: Vital signs in last 24 hours: Temp:  [97.2 F (36.2 C)-97.9 F (36.6 C)] 97.2 F (36.2 C) (08/06 0818) Pulse Rate:  [67-108] 73 (08/06 0818) Resp:  [14-158] 158 (08/06 0818) BP: (115-157)/(49-98) 157/64 (08/06 0818) SpO2:  [94 %-100 %] 99 % (08/06 0818) Weight:  [71.4 kg] 71.4 kg (08/06 0430) Last BM Date: 06/06/20 RR - < 20 on my exam. I believe the 158 was an error   Intake/Output from previous day: 08/05 0701 - 08/06 0700 In: 2140 [P.O.:20; I.V.:1960; IV Piggyback:160] Out: 750 [Urine:700; Blood:50] Intake/Output this shift: No intake/output data recorded.  PE: Gen:  Alert, NAD, pleasant Pulm: Rate and effort normal  Abd: Soft, ND, generalized tenderness greatest around the midline, colostomy bag with brown sem-formed stool in the bag. Stoma pink and slightly retracted. Midline wound as below.  Ext:  No LE edema Psych: A&Ox3  Skin: no rashes noted, warm and dry     Lab Results:  Recent Labs    06/06/20 0456 06/07/20 0500  WBC 5.5 9.2  HGB 10.1* 9.4*  HCT 32.3* 30.8*  PLT 230 217   BMET Recent Labs    06/06/20 0456 06/07/20 0500  NA 137 135  K 5.1 4.2  CL 101 105  CO2 29 24  GLUCOSE 152* 232*  BUN 9 20  CREATININE 0.67 0.66  CALCIUM 8.4* 8.0*   PT/INR No results for input(s): LABPROT, INR in the last 72 hours. CMP     Component Value Date/Time   NA 135 06/07/2020 0500   K 4.2 06/07/2020 0500   CL 105 06/07/2020 0500   CO2 24 06/07/2020 0500   GLUCOSE 232 (H) 06/07/2020 0500   BUN 20 06/07/2020 0500   CREATININE 0.66 06/07/2020 0500   CALCIUM 8.0 (L) 06/07/2020 0500   PROT 6.0 (L) 06/06/2020 0456   ALBUMIN 2.1 (L) 06/06/2020 0456   AST 14 (L) 06/06/2020 0456   ALT 9 06/06/2020 0456   ALKPHOS 40 06/06/2020 0456   BILITOT 0.6 06/06/2020  0456   GFRNONAA >60 06/07/2020 0500   GFRAA >60 06/07/2020 0500   Lipase     Component Value Date/Time   LIPASE 31 05/31/2020 2038       Studies/Results: No results found.  Anti-infectives: Anti-infectives (From admission, onward)   Start     Dose/Rate Route Frequency Ordered Stop   06/06/20 1615  metroNIDAZOLE (FLAGYL) IVPB 500 mg     Discontinue     500 mg 100 mL/hr over 60 Minutes Intravenous Every 8 hours 06/06/20 1605     06/06/20 1400  piperacillin-tazobactam (ZOSYN) IVPB 3.375 g     Discontinue     3.375 g 100 mL/hr over 30 Minutes Intravenous Every 8 hours 06/06/20 1324 06/11/20 1359   06/06/20 0945  polymyxin B 500,000 Units, bacitracin 50,000 Units in sodium chloride 0.9 % 500 mL irrigation  Status:  Discontinued          As needed 06/06/20 0945 06/06/20 1242   06/06/20 0800  cefoTEtan (CEFOTAN) 2 g in sodium chloride 0.9 % 100 mL IVPB        2 g 200 mL/hr over 30 Minutes Intravenous On call to O.R. 06/05/20 0831 06/06/20 1030   06/02/20 1030  vancomycin (VANCOCIN) 50 mg/mL oral solution 125 mg  Status:  Discontinued        125 mg Oral 3 times daily before meals & bedtime 06/02/20 0822 06/06/20 1605   06/01/20 1130  metroNIDAZOLE (FLAGYL) tablet 500 mg  Status:  Discontinued        500 mg Oral 3 times daily 06/01/20 1124 06/02/20 8184       Assessment/Plan PAD chronic diastolic heart failure CKD stage II HTN T2DM HLD Chronic back pain Depression Hx aortobifem bypass on 04/18/2020 by Dr. Donzetta Matters  C diff colitis - Previously on oral vanc. No on IV flagyl. Per TRH. I messaged them to let them know she is on a diet and can be restarted on oral vanc   Descending/sigmoid colonic stricture due to ischemic colitis. Contained perforation of the rectosigmoid junction S/p Exploratory laparotomy, LOA, Extended left colectomy with end colostomy (Hartmann's procedure) by Dr. Redmond Pulling, 06/06/2020 - POD #1 - Continue IV Zosyn for 5 days post-op. Discussed with dr. Tommy Medal and  Pharmacy post op for recommendations  - Start CLD - Mobilize, PT - Wound vac, change M/W/F  FEN - TPN, CLD VTE - SCds, subq heparin ID - C. Diff tx per TRH. IV Zosyn 8/5 >> Foley - d/c     LOS: 6 days    Jillyn Ledger , [facility] Surgery 06/07/2020, 9:10 AM Please see Amion for pager number during day hours 7:00am-4:30pm

## 2020-06-07 NOTE — Progress Notes (Signed)
PROGRESS NOTE    Tonya Hughes  CBU:[id number] DOB: 1951/03/13 DOA: 05/31/2020 PCP: Monico Blitz, MD   Brief Narrative:   Tonya Magley Nanceis an 69 y.o.femalepast medical history of chronic diastolic heart failure, diabetes mellitus DVT essential hypertension status post aortobifemoral bypass on 04/18/2020 recently discharged from the hospital for C. difficile colitis on 05/30/2020 who presents to the hospital with ongoing abdominal pain. During her recent admission she was transfused 2 units of packed red blood cells due to rectal bleeding GI was consulted who perform a flex sig on 05/20/2020 that showed colonic stricture with pathology consistent with ischemic colitis. During this time GI recommended surgical consult which the patient refused at that time. Since discharge she is of been having worsening abdominal pain and diarrhea,she isnowamenable to surgery.  8/6: S/p left colectomy w/ colostomy. Now on CLD. She has agreed to take PO vanc as long as it's in cranberry juice. She needs to get mobile.    Assessment & Plan:  Readmitted for abdominal pain due to sigmoid colonic stricture causing partial small bowel obstruction - Abdominal x-ray done on 06/01/2020 showed partial small bowel obstruction.  - Barium enema demonstrated a long smooth stricture from the descending colon to the rectum on her previous admission. - she is on TPN - s/p left colectomy w/ colostomy; appreciate surgery assistance     - 8/6: advanced to CLD, agreed to take PO vanc again, needs to get mobile, PT/OT consulted  History of of C. Dif. colitis - She tested positive for C. difficile on 05/17/2020, still having some diarrhea, Flagyl is not optimal for treatment of C. difficile colitis.  - She was being noncompliant with her Flagyl as an outpatient. - Started on oral vancomycin and will continue total treatment of 10 days. - diarrhea is improved, follow, maintain precautions     -  8/6: now agrees to taking PO vanc again, will transition back; continue to follow  Vaginal candidiasis - nystatin  Hypovolemic hyponatremia: - resolved on fluids  Hypokalemia Hypomagnesemia - K+/Mg2+ replaced. Follow  AKI - resolved, follow  Hx of PVD status post aortobifemoral bypass: - aspirin and statins.  Essential hypertension Chronic diastolic heart failure - lisinopril, coreg - follow I&O, daily wts  DMt2 - A1c of 6.9. - she is on TPN     - CLD started; increase SSI to mod lvl  Left heel unstageable sacral decubitus ulcer present on admission: - RN Pressure Injury Documentation: - Pressure Injury 04/27/20 Heel Left Deep Tissue Pressure Injury - Purple or maroon localized area of discolored intact skin or blood-filled blister due to damage of underlying soft tissue from pressure and/or shear. (Active) 04/27/20 2110 Location: Heel Location Orientation: Left Staging: Deep Tissue Pressure Injury - Purple or maroon localized area of discolored intact skin or blood-filled blister due to damage of underlying soft tissue from pressure and/or shear. Wound Description (Comments):  Present on Admission:  DVT prophylaxis: heparin Code Status: DNR Family Communication: None at bedside   Status is: Inpatient  Remains inpatient appropriate because:Inpatient level of care appropriate due to severity of illness   Dispo: The patient is from: Home              Anticipated d/c is to: Home              Anticipated d/c date is: > 3 days              Patient currently is not medically stable to d/c.  Consultants:  General surgery  Procedures:   Left colectomy, colostomy placement  Antimicrobials:  . vancomycin   ROS:  Reports ab pain. Denies CP, N, V. Remainder ROS is negative for all not previously mentioned.  Subjective: "I don't want to do that!"  Objective: Vitals:   06/07/20 0055 06/07/20 0430 06/07/20  0818 06/07/20 1150  BP: (!) 136/56 (!) 136/49 (!) 157/64 (!) 136/50  Pulse: 74 67 73 76  Resp: 18 18 20 18   Temp: 97.8 F (36.6 C) 97.8 F (36.6 C) (!) 97.2 F (36.2 C) 98.2 F (36.8 C)  TempSrc: Axillary Axillary Axillary Oral  SpO2: 98% 94% 99% 97%  Weight:  71.4 kg    Height:        Intake/Output Summary (Last 24 hours) at 06/07/2020 1254 Last data filed at 06/07/2020 1000 Gross per 24 hour  Intake 360 ml  Output 750 ml  Net -390 ml   Filed Weights   06/06/20 0042 06/06/20 0809 06/07/20 0430  Weight: 69.8 kg 69.8 kg 71.4 kg    Examination:  General: 69 y.o. female resting in bed in NAD Cardiovascular: RRR, +S1, S2, no m/g/r, equal pulses throughout Respiratory: CTABL, no w/r/r, normal WOB GI: ND, surgical site/ostomy noted, BS hypoactive, global ttp MSK: No e/c/c Neuro: A&O x 3, no focal deficits Psyc: Appropriate interaction and affect, calm/cooperative  Data Reviewed: I have personally reviewed following labs and imaging studies.  CBC: Recent Labs  Lab 06/01/20 1251 06/02/20 0708 06/05/20 0410 06/06/20 0456 06/07/20 0500  WBC 8.6 6.1 5.9 5.5 9.2  NEUTROABS  --   --  3.2 2.6  --   HGB 9.7* 8.9* 9.6* 10.1* 9.4*  HCT 31.7* 29.2* 30.1* 32.3* 30.8*  MCV 96.6 95.4 95.3 96.7 96.9  PLT 271 271 245 230 784   Basic Metabolic Panel: Recent Labs  Lab 06/01/20 1251 06/02/20 0708 06/05/20 0410 06/06/20 0456 06/07/20 0500  NA 136 137 137 137 135  K 2.9* 3.7 3.5 5.1 4.2  CL 102 104 101 101 105  CO2 25 27 28 29 24   GLUCOSE 101* 91 138* 152* 232*  BUN 9 9 6* 9 20  CREATININE 1.23* 1.03* 0.70 0.67 0.66  CALCIUM 7.8* 8.0* 8.1* 8.4* 8.0*  MG 1.6*  --  1.6* 2.2 1.8  PHOS  --   --  3.4 4.1 3.4   GFR: Estimated Creatinine Clearance: 64.6 mL/min (by C-G formula based on SCr of 0.66 mg/dL). Liver Function Tests: Recent Labs  Lab 05/31/20 2038 06/05/20 0410 06/06/20 0456  AST 21 13* 14*  ALT 12 8 9   ALKPHOS 58 39 40  BILITOT 0.5 0.4 0.6  PROT 7.1 5.5* 6.0*    ALBUMIN 2.6* 2.0* 2.1*   Recent Labs  Lab 05/31/20 2038  LIPASE 31   No results for input(s): AMMONIA in the last 168 hours. Coagulation Profile: No results for input(s): INR, PROTIME in the last 168 hours. Cardiac Enzymes: No results for input(s): CKTOTAL, CKMB, CKMBINDEX, TROPONINI in the last 168 hours. BNP (last 3 results) No results for input(s): PROBNP in the last 8760 hours. HbA1C: No results for input(s): HGBA1C in the last 72 hours. CBG: Recent Labs  Lab 06/06/20 2047 06/07/20 0035 06/07/20 0419 06/07/20 0815 06/07/20 1147  GLUCAP 315* 308* 249* 203* 180*   Lipid Profile: Recent Labs    06/05/20 0410  TRIG 113   Thyroid Function Tests: No results for input(s): TSH, T4TOTAL, FREET4, T3FREE, THYROIDAB in the last 72 hours. Anemia Panel: No results for input(s):  VITAMINB12, FOLATE, FERRITIN, TIBC, IRON, RETICCTPCT in the last 72 hours. Sepsis Labs: Recent Labs  Lab 06/01/20 1251 06/01/20 1637  LATICACIDVEN 1.1 0.9    Recent Results (from the past 240 hour(s))  SARS Coronavirus 2 by RT PCR (hospital order, performed in [facility] hospital lab) Nasopharyngeal Nasopharyngeal Swab     Status: None   Collection Time: 06/01/20 12:17 PM   Specimen: Nasopharyngeal Swab  Result Value Ref Range Status   SARS Coronavirus 2 NEGATIVE NEGATIVE Final    Comment: (NOTE) SARS-CoV-2 target nucleic acids are NOT DETECTED.  The SARS-CoV-2 RNA is generally detectable in upper and lower respiratory specimens during the acute phase of infection. The lowest concentration of SARS-CoV-2 viral copies this assay can detect is 250 copies / mL. A negative result does not preclude SARS-CoV-2 infection and should not be used as the sole basis for treatment or other patient management decisions.  A negative result may occur with improper specimen collection / handling, submission of specimen other than nasopharyngeal swab, presence of viral mutation(s) within the areas targeted  by this assay, and inadequate number of viral copies (<250 copies / mL). A negative result must be combined with clinical observations, patient history, and epidemiological information.  Fact Sheet for Patients:   StrictlyIdeas.no  Fact Sheet for Healthcare Providers: BankingDealers.co.za  This test is not yet approved or  cleared by the [place] FDA and has been authorized for detection and/or diagnosis of SARS-CoV-2 by FDA under an Emergency Use Authorization (EUA).  This EUA will remain in effect (meaning this test can be used) for the duration of the COVID-19 declaration under Section 564(b)(1) of the Act, 21 U.S.C. section 360bbb-3(b)(1), unless the authorization is terminated or revoked sooner.  Performed at [facility], [place] [address] [place], [place] [address]   Culture, blood (routine x 2)     Status: None   Collection Time: 06/01/20 12:51 PM   Specimen: BLOOD  Result Value Ref Range Status   Specimen Description BLOOD RIGHT ANTECUBITAL  Final   Special Requests   Final    BOTTLES DRAWN AEROBIC AND ANAEROBIC Blood Culture adequate volume   Culture   Final    NO GROWTH 5 DAYS Performed at [facility], [place] [address], [place], [place] [address]    Report Status 06/06/2020 FINAL  Final  Culture, blood (routine x 2)     Status: None   Collection Time: 06/01/20  4:37 PM   Specimen: BLOOD  Result Value Ref Range Status   Specimen Description BLOOD RIGHT ANTECUBITAL  Final   Special Requests   Final    BOTTLES DRAWN AEROBIC ONLY Blood Culture adequate volume   Culture   Final    NO GROWTH 5 DAYS Performed at [facility], [place] [address], [place], [place] [address]    Report Status 06/06/2020 FINAL  Final      Radiology Studies: No results found.   Scheduled Meds: . acetaminophen  1,000 mg Oral Q6H  . aspirin EC  81 mg Oral Daily  . atorvastatin  40 mg Oral Daily  . carvedilol  3.125 mg  Oral BID WC  . Chlorhexidine Gluconate Cloth  6 each Topical Daily  . feeding supplement  237 mL Oral BID BM  . heparin injection (subcutaneous)  5,000 Units Subcutaneous Q8H  . insulin aspart  0-9 Units Subcutaneous Q4H  . lisinopril  5 mg Oral Daily  . methocarbamol  500 mg Oral TID  .  nystatin cream   Topical BID  . PARoxetine  20 mg Oral Daily  . saccharomyces boulardii  250 mg Oral BID  . sodium chloride flush  10-40 mL Intracatheter Q12H  . sodium chloride flush  3 mL Intravenous Once  . sodium chloride flush  3 mL Intravenous Q12H  . vancomycin  125 mg Oral Q6H   Continuous Infusions: . sodium chloride 250 mL (06/05/20 1058)  . sodium chloride 75 mL/hr at 06/07/20 1221  . lactated ringers 10 mL/hr at 06/06/20 0815  . piperacillin-tazobactam 3.375 g (06/07/20 0554)  . TPN ADULT (ION) 80 mL/hr at 06/06/20 1806  . TPN ADULT (ION)       LOS: 6 days    Time spent: 25 minutes spent in the coordination of care today.    Jonnie Finner, DO [facility]  If 7PM-7AM, please contact night-coverage [email or web address] 06/07/2020, 12:54 PM

## 2020-06-07 NOTE — TOC Initial Note (Addendum)
Transition of Care [facility]) - Initial/Assessment Note    Patient Details  Name: Tonya Hughes MRN: [id number] Date of Birth: December 20, 1950  [facility] CM/SW Contact:    Trula Ore, [place] Phone Number: 06/07/2020, 4:53 PM  Clinical Narrative:                  CSW spoke with patient at bedside. Patient is agreeable to SNF placement. Patient gave CSW permission to discuss her care with her spouse Carlos American. Patient wants CSW to provide SNF bed offers to her spouse Carlos American. Patient gave CSW permission to fax out initial referral near [place] area. Patient has not had covid vaccines and does not want to be vaccinated.  Pending bed offers. CSW will start insurance authorization for patient.  TOC team will continue to follow.  Expected Discharge Plan: Skilled Nursing Facility Barriers to Discharge: Continued Medical Work up   Patient Goals and CMS Choice Patient states their goals for this hospitalization and ongoing recovery are:: SNF CMS Medicare.gov Compare Post Acute Care list provided to:: Patient Choice offered to / list presented to : Patient  Expected Discharge Plan and Services Expected Discharge Plan: [place]       Living arrangements for the past 2 months: Single Family Home                                      Prior Living Arrangements/Services Living arrangements for the past 2 months: Single Family Home Lives with:: Self, Spouse Patient language and need for interpreter reviewed:: Yes Do you feel safe going back to the place where you live?: No   SNF  Need for Family Participation in Patient Care: Yes (Comment) Care giver support system in place?: Yes (comment)   Criminal Activity/Legal Involvement Pertinent to Current Situation/Hospitalization: No - Comment as needed  Activities of Daily Living Home Assistive Devices/Equipment: Cane (specify quad or straight) ADL Screening (condition at time of admission) Patient's  cognitive ability adequate to safely complete daily activities?: No Is the patient deaf or have difficulty hearing?: No Does the patient have difficulty seeing, even when wearing glasses/contacts?: No Does the patient have difficulty concentrating, remembering, or making decisions?: No Patient able to express need for assistance with ADLs?: Yes Does the patient have difficulty dressing or bathing?: Yes Independently performs ADLs?: No Communication: Independent Dressing (OT): Needs assistance Is this a change from baseline?: Change from baseline, expected to last >3 days Grooming: Needs assistance Is this a change from baseline?: Change from baseline, expected to last >3 days Feeding: Needs assistance Is this a change from baseline?: Change from baseline, expected to last >3 days Bathing: Needs assistance Is this a change from baseline?: Change from baseline, expected to last >3 days Toileting: Needs assistance Is this a change from baseline?: Change from baseline, expected to last >3days In/Out Bed: Needs assistance Is this a change from baseline?: Change from baseline, expected to last >3 days Walks in Home: Needs assistance Is this a change from baseline?: Change from baseline, expected to last >3 days Does the patient have difficulty walking or climbing stairs?: Yes Weakness of Legs: Both Weakness of Arms/Hands: Both  Permission Sought/Granted Permission sought to share information with : Case Manager, Family Supports, Customer service manager Permission granted to share information with : Yes, Verbal Permission Granted  Share Information with NAME: Carlos American  Permission granted to share info w  AGENCY: SNF  Permission granted to share info w Relationship: Spouse  Permission granted to share info w Contact Information: Carlos American [phone]  Emotional Assessment Appearance:: Appears stated age Attitude/Demeanor/Rapport: Gracious Affect (typically observed):  Calm Orientation: : Oriented to Self, Oriented to Place, Oriented to  Time, Oriented to Situation Alcohol / Substance Use: Not Applicable Psych Involvement: No (comment)  Admission diagnosis:  Dehydration [E86.0] Hypokalemia [E87.6] Generalized abdominal pain [R10.84] AKI (acute kidney injury) ([place]) [N17.9] C. difficile colitis [A04.72] Ischemic colitis ([place]) [K55.9] Abdominal pain [R10.9] Patient Active Problem List   Diagnosis Date Noted  . Malnutrition of moderate degree 06/05/2020  . Hypokalemia 06/01/2020  . Abdominal pain 05/17/2020  . Hyponatremia 05/17/2020  . Diarrhea 05/17/2020  . C. difficile colitis 05/17/2020  . Hemorrhagic shock ([place])   . Peripheral arterial occlusive disease ([place]) 04/18/2020  . Shock ([place]) 04/18/2020  . Hypertension   . Hypercholesteremia   . GERD (gastroesophageal reflux disease)   . Depressive disorder   . Congestive heart failure (CHF) ([place])   . Back pain   . Acute deep vein thrombosis (DVT) of proximal vein of right lower extremity ([place]) 08/27/2017  . Closed fracture of lumbar vertebra ([place]) 05/31/2017  . S/P lumbar fusion 04/21/2017  . Lumbar radiculopathy 02/25/2017   PCP:  Monico Blitz, MD Pharmacy:   [facility] ([place] [place] [address] Phone: [phone] Fax: [phone]  [facility] [place], [place] - [address] [place] [address] Phone: [phone] Fax: [phone]  [place], [place] [address] Phone: [phone] Fax: [phone]     Social Determinants of Health (SDOH) Interventions    Readmission Risk Interventions No flowsheet data found.

## 2020-06-08 LAB — CBC WITH DIFFERENTIAL/PLATELET
Abs Immature Granulocytes: 0.05 10*3/uL (ref 0.00–0.07)
Basophils Absolute: 0 10*3/uL (ref 0.0–0.1)
Basophils Relative: 0 %
Eosinophils Absolute: 0.2 10*3/uL (ref 0.0–0.5)
Eosinophils Relative: 2 %
HCT: 26.2 % — ABNORMAL LOW (ref 36.0–46.0)
Hemoglobin: 7.9 g/dL — ABNORMAL LOW (ref 12.0–15.0)
Immature Granulocytes: 1 %
Lymphocytes Relative: 23 %
Lymphs Abs: 2.2 10*3/uL (ref 0.7–4.0)
MCH: 29.7 pg (ref 26.0–34.0)
MCHC: 30.2 g/dL (ref 30.0–36.0)
MCV: 98.5 fL (ref 80.0–100.0)
Monocytes Absolute: 0.6 10*3/uL (ref 0.1–1.0)
Monocytes Relative: 7 %
Neutro Abs: 6.5 10*3/uL (ref 1.7–7.7)
Neutrophils Relative %: 67 %
Platelets: 189 10*3/uL (ref 150–400)
RBC: 2.66 MIL/uL — ABNORMAL LOW (ref 3.87–5.11)
RDW: 17.2 % — ABNORMAL HIGH (ref 11.5–15.5)
WBC: 9.6 10*3/uL (ref 4.0–10.5)
nRBC: 0 % (ref 0.0–0.2)

## 2020-06-08 LAB — BASIC METABOLIC PANEL
Anion gap: 7 (ref 5–15)
BUN: 18 mg/dL (ref 8–23)
CO2: 24 mmol/L (ref 22–32)
Calcium: 8.2 mg/dL — ABNORMAL LOW (ref 8.9–10.3)
Chloride: 106 mmol/L (ref 98–111)
Creatinine, Ser: 0.64 mg/dL (ref 0.44–1.00)
GFR calc Af Amer: >60 mL/min (ref >60)
GFR calc non Af Amer: >60 mL/min (ref >60)
Glucose, Bld: 245 mg/dL — ABNORMAL HIGH (ref 70–99)
Potassium: 4.2 mmol/L (ref 3.5–5.1)
Sodium: 137 mmol/L (ref 135–145)

## 2020-06-08 LAB — GLUCOSE, CAPILLARY
Glucose-Capillary: 141 mg/dL — ABNORMAL HIGH (ref 70–99)
Glucose-Capillary: 155 mg/dL — ABNORMAL HIGH (ref 70–99)
Glucose-Capillary: 160 mg/dL — ABNORMAL HIGH (ref 70–99)
Glucose-Capillary: 166 mg/dL — ABNORMAL HIGH (ref 70–99)
Glucose-Capillary: 175 mg/dL — ABNORMAL HIGH (ref 70–99)
Glucose-Capillary: 188 mg/dL — ABNORMAL HIGH (ref 70–99)

## 2020-06-08 LAB — MAGNESIUM: Magnesium: 2.1 mg/dL (ref 1.7–2.4)

## 2020-06-08 MED ORDER — INSULIN ASPART 100 UNIT/ML ~~LOC~~ SOLN
0.0000 [IU] | SUBCUTANEOUS | Status: DC
  Administered 2020-06-08: 2 [IU] via SUBCUTANEOUS
  Administered 2020-06-08 – 2020-06-09 (×3): 3 [IU] via SUBCUTANEOUS
  Administered 2020-06-09: 2 [IU] via SUBCUTANEOUS
  Administered 2020-06-09: 3 [IU] via SUBCUTANEOUS
  Administered 2020-06-09 – 2020-06-10 (×6): 2 [IU] via SUBCUTANEOUS
  Administered 2020-06-10: 3 [IU] via SUBCUTANEOUS
  Administered 2020-06-11: 2 [IU] via SUBCUTANEOUS
  Administered 2020-06-11: 3 [IU] via SUBCUTANEOUS
  Administered 2020-06-11: 2 [IU] via SUBCUTANEOUS

## 2020-06-08 MED ORDER — TRAVASOL 10 % IV SOLN
INTRAVENOUS | Status: AC
  Filled 2020-06-08: qty 960

## 2020-06-08 NOTE — Evaluation (Signed)
Occupational Therapy Evaluation Patient Details Name: Tonya Hughes MRN: [id number] DOB: 09-08-1951 Today's Date: 06/08/2020    History of Present Illness Tonya Hughes is an 69 y.o. female past medical history of chronic diastolic heart failure, diabetes mellitus DVT essential hypertension status post aortobifemoral bypass on 04/18/2020 recently discharged from the hospital for C. difficile colitis on 05/30/2020 who presents to the hospital with ongoing abdominal pain.  During her recent admission she was transfused 2 units of packed red blood cells due to rectal bleeding GI was consulted who perform a flex sig on 05/20/2020 that showed colonic stricture with pathology consistent with ischemic colitis. Colostomy placed 8/5   Clinical Impression   Pt admitted with above diagnoses, presenting with extreme abdominal pain, generalized weakness, and agitation. Pt is unreliable historian. Chart reflects prior home information, but suspect pt was at SNF prior to admission. Upon OT arrival, pt soiled in bed and requiring mod A to roll with bed rails and total A for posterior peri care. Educated pt on bracing abdomen with pillow for comfort. Significantly encouraged EOB or OOB mobility, pt adamantly refusing and becoming agitated. Stating "no I need to be still" or  "I will only do it when I feel like it why don't you understand that". Given current status, recommend SNF at d/c. Will continue to follow per POC listed below.    Follow Up Recommendations  SNF    Equipment Recommendations  None recommended by OT    Recommendations for Other Services       Precautions / Restrictions Precautions Precautions: Fall Precaution Comments: pressure wound on left heel, c-diff diarrhea, Bilat wounds to groin with wound vac; ostomy bag Restrictions Weight Bearing Restrictions: No      Mobility Bed Mobility Overal bed mobility: Needs Assistance Bed Mobility: Rolling Rolling: Mod assist         General  bed mobility comments: assist at hips to roll for bed pad placement due to purewick leaking. Cues for pt to pull with arms on bed rail  Transfers                 General transfer comment: pt adamantly refuses at this time    Balance                                           ADL either performed or assessed with clinical judgement   ADL Overall ADL's : Needs assistance/impaired Eating/Feeding: Set up;Sitting   Grooming: Set up;Bed level                                 General ADL Comments: pt otherwise total A for BADLs due to pain and refusal to mobilize past the bed level with therapist     Vision Patient Visual Report: No change from baseline       Perception     Praxis      Pertinent Vitals/Pain Pain Assessment: 0-10 Pain Score: 7  Pain Location: abdomen Pain Descriptors / Indicators: Sore;Moaning Pain Intervention(s): Limited activity within patient's tolerance;Monitored during session;Repositioned     Hand Dominance     Extremity/Trunk Assessment Upper Extremity Assessment Upper Extremity Assessment: Generalized weakness   Lower Extremity Assessment Lower Extremity Assessment: Generalized weakness       Communication Communication Communication: No difficulties   Cognition Arousal/Alertness:  Awake/alert Behavior During Therapy: Flat affect;Agitated Overall Cognitive Status: Difficult to assess                                 General Comments: difficult to assess this date due to pt minimal interaction with therapist. Refusing to get out of bed, agitated and upset easily   General Comments       Exercises     Shoulder Instructions      Home Living Family/patient expects to be discharged to:: Skilled nursing facility Living Arrangements: Spouse/significant other Available Help at Discharge: Family;Available PRN/intermittently Type of Home: House   Entrance Stairs-Number of Steps: 3 Entrance  Stairs-Rails: Right;Left Home Layout: One level     Bathroom Shower/Tub: Occupational psychologist: Standard Bathroom Accessibility: Yes How Accessible: Accessible via walker Home Equipment: Shower seat;Bedside commode;Walker - 4 wheels;Wheelchair - manual          Prior Functioning/Environment Level of Independence: Independent with assistive device(s)        Comments: pt is questionable historian, may have been at SNF Prior to admin        OT Problem List: Decreased strength;Decreased activity tolerance;Impaired balance (sitting and/or standing);Decreased cognition;Decreased safety awareness;Decreased knowledge of use of DME or AE;Decreased knowledge of precautions;Pain      OT Treatment/Interventions: Self-care/ADL training;DME and/or AE instruction;Therapeutic activities;Cognitive remediation/compensation;Patient/family education;Balance training;Therapeutic exercise    OT Goals(Current goals can be found in the care plan section) Acute Rehab OT Goals Patient Stated Goal: to go home OT Goal Formulation: With patient Time For Goal Achievement: 06/22/20 Potential to Achieve Goals: Good  OT Frequency: Min 2X/week   Barriers to D/C:            Co-evaluation              AM-PAC OT "6 Clicks" Daily Activity     Outcome Measure Help from another person eating meals?: A Little Help from another person taking care of personal grooming?: A Little Help from another person toileting, which includes using toliet, bedpan, or urinal?: Total Help from another person bathing (including washing, rinsing, drying)?: Total Help from another person to put on and taking off regular upper body clothing?: Total Help from another person to put on and taking off regular lower body clothing?: Total 6 Click Score: 10   End of Session Nurse Communication: Mobility status  Activity Tolerance: Patient limited by pain;Treatment limited secondary to agitation Patient left: in  bed;with call bell/phone within reach;with bed alarm set  OT Visit Diagnosis: Other abnormalities of gait and mobility (R26.89);Muscle weakness (generalized) (M62.81);Other symptoms and signs involving cognitive function;Pain Pain - part of body:  (abdomen)                Time: 4970-2637 OT Time Calculation (min): 19 min Charges:  OT General Charges $OT Visit: 1 Visit OT Evaluation $OT Eval Moderate Complexity: [place], MSOT, OTR/L [facility] Office Number: [phone] Pager: [phone]  Zenovia Jarred 06/08/2020, 11:36 AM

## 2020-06-08 NOTE — Progress Notes (Signed)
PHARMACY - TOTAL PARENTERAL NUTRITION CONSULT NOTE   Indication: Small bowel obstruction and Malnutrition  Patient Measurements: Height: 5' 3"  (160 cm) Weight: 71.4 kg (157 lb 6.5 oz) IBW/kg (Calculated) : 52.4 TPN AdjBW (KG): 56.8 Body mass index is 27.88 kg/m.  Assessment: 80 YOF readmitted with abdominal pain and found to have sigmoid colonic stricture causing partial small bowel obstruction. Per surgery team, patient is taking almost no full liquids and only drinking juice. A soft diet has been ordered but surgery thinks that based on patient's history, she will not be able to tolerate solid foods. Pre-albumin today is low at 10.2.   Glucose / Insulin: A1c 6.9, Patient received one dose of dexamethasone 5 mg on 8/5. CBGs improved to 175-203 on mSSI, 8 units insulin in last 24 hours  Electrolytes: K 4.2, Mg 2.1, other lytes wnl, Home oral potassium supplements d/c'ed  Renal: SCr normalized   LFTs / TGs: LFTs wnl, Tbili wnl  Prealbumin / albumin: Pre-albumin 10.2, Alb 2.1 Intake / Output; MIVF: NS@ 75 ml/hr, UOP 0.7 mL/kg/hr  GI Imaging: Surgeries / Procedures:  8/5: Extended left colectomy + sigmoid colectomy and placement of colostomy  Central access: PICC 8/3>> TPN start date: 8/3   Nutritional Goals (Per RD recommendations on 8/3): kCal: 1900-2100, Protein: 90-105g, Fluid:  Goal TPN rate is 80 mL/hr (provides 96 g of protein and 2066 kcals per day)  Current Nutrition:  Clear liquids started - 10% charted Ensure Surgery BID - none consumed  TPN   Plan:  Continue TPN at goal rate of 48m/hr at 1800 Electrolytes in TPN: 761m/L of Na, cont to 3097mL of K, 5mE57m of Ca, cont to 10mE70mof Mg, and 15mmo25mof Phos. Cont Cl:Ac ratio of 1:1 Add standard MVI and trace elements to TPN Change moderate SSI to q4h and adjust as needed  Monitor TPN labs on Mon/Thurs   Thank you for involving pharmacy in this patient's care.  JennifRenold GentamD, BCPS Clinical  Pharmacist Clinical phone for 06/08/2020 until 3p is [phone] 7:28 AM  **Pharmacist phone directory can be found on [facility]ion[facility] under [facility]

## 2020-06-08 NOTE — Progress Notes (Signed)
Patient ID: Tonya Hughes, female   DOB: Apr 21, 1951, 69 y.o.   MRN: [id number] [facility] Surgery Progress Note:   2 Days Post-Op  Subjective: Mental status is alert and responsive.  Complaints --don't feel well. Objective: Vital signs in last 24 hours: Temp:  [98.1 F (36.7 C)-98.9 F (37.2 C)] 98.1 F (36.7 C) (08/07 0828) Pulse Rate:  [69-77] 71 (08/07 0828) Resp:  [18] 18 (08/07 0828) BP: (121-158)/(47-69) 158/69 (08/07 0828) SpO2:  [97 %-99 %] 99 % (08/07 0828)  Intake/Output from previous day: 08/06 0701 - 08/07 0700 In: 1715.3 [P.O.:320; I.V.:1258.7; IV Piggyback:136.7] Out: 1200 [Urine:1200] Intake/Output this shift: No intake/output data recorded.  Physical Exam: Work of breathing is not labored.  Wound with VAC; drains in place  Lab Results:  Results for orders placed or performed during the hospital encounter of 05/31/20 (from the past 48 hour(s))  Glucose, capillary     Status: Abnormal   Collection Time: 06/06/20 12:40 PM  Result Value Ref Range   Glucose-Capillary 223 (H) 70 - 99 mg/dL    Comment: Glucose reference range applies only to samples taken after fasting for at least 8 hours.  Glucose, capillary     Status: Abnormal   Collection Time: 06/06/20  4:58 PM  Result Value Ref Range   Glucose-Capillary 323 (H) 70 - 99 mg/dL    Comment: Glucose reference range applies only to samples taken after fasting for at least 8 hours.  Glucose, capillary     Status: Abnormal   Collection Time: 06/06/20  8:47 PM  Result Value Ref Range   Glucose-Capillary 315 (H) 70 - 99 mg/dL    Comment: Glucose reference range applies only to samples taken after fasting for at least 8 hours.  Glucose, capillary     Status: Abnormal   Collection Time: 06/07/20 12:35 AM  Result Value Ref Range   Glucose-Capillary 308 (H) 70 - 99 mg/dL    Comment: Glucose reference range applies only to samples taken after fasting for at least 8 hours.  Glucose, capillary     Status: Abnormal    Collection Time: 06/07/20  4:19 AM  Result Value Ref Range   Glucose-Capillary 249 (H) 70 - 99 mg/dL    Comment: Glucose reference range applies only to samples taken after fasting for at least 8 hours.  Basic metabolic panel     Status: Abnormal   Collection Time: 06/07/20  5:00 AM  Result Value Ref Range   Sodium 135 135 - 145 mmol/L   Potassium 4.2 3.5 - 5.1 mmol/L   Chloride 105 98 - 111 mmol/L   CO2 24 22 - 32 mmol/L   Glucose, Bld 232 (H) 70 - 99 mg/dL    Comment: Glucose reference range applies only to samples taken after fasting for at least 8 hours.   BUN 20 8 - 23 mg/dL   Creatinine, Ser 0.66 0.44 - 1.00 mg/dL   Calcium 8.0 (L) 8.9 - 10.3 mg/dL   GFR calc non Af Amer >60 >60 mL/min   GFR calc Af Amer >60 >60 mL/min   Anion gap 6 5 - 15    Comment: Performed at [facility] [address], [place], [place] [address]  CBC     Status: Abnormal   Collection Time: 06/07/20  5:00 AM  Result Value Ref Range   WBC 9.2 4.0 - 10.5 K/uL   RBC 3.18 (L) 3.87 - 5.11 MIL/uL   Hemoglobin 9.4 (L) 12.0 - 15.0  g/dL   HCT 30.8 (L) 36 - 46 %   MCV 96.9 80.0 - 100.0 fL   MCH 29.6 26.0 - 34.0 pg   MCHC 30.5 30.0 - 36.0 g/dL   RDW 16.7 (H) 11.5 - 15.5 %   Platelets 217 150 - 400 K/uL   nRBC 0.0 0.0 - 0.2 %    Comment: Performed at [facility] [address] [place], [place] [address]  Magnesium     Status: None   Collection Time: 06/07/20  5:00 AM  Result Value Ref Range   Magnesium 1.8 1.7 - 2.4 mg/dL    Comment: Performed at [facility] [address] [place], [place] [address]  Phosphorus     Status: None   Collection Time: 06/07/20  5:00 AM  Result Value Ref Range   Phosphorus 3.4 2.5 - 4.6 mg/dL    Comment: Performed at [facility] [address], [place], [place] [address]  Glucose, capillary     Status: Abnormal   Collection Time: 06/07/20  8:15 AM  Result Value Ref Range   Glucose-Capillary 203 (H) 70 - 99 mg/dL    Comment: Glucose reference  range applies only to samples taken after fasting for at least 8 hours.  Glucose, capillary     Status: Abnormal   Collection Time: 06/07/20 11:47 AM  Result Value Ref Range   Glucose-Capillary 180 (H) 70 - 99 mg/dL    Comment: Glucose reference range applies only to samples taken after fasting for at least 8 hours.  Glucose, capillary     Status: Abnormal   Collection Time: 06/07/20  4:36 PM  Result Value Ref Range   Glucose-Capillary 194 (H) 70 - 99 mg/dL    Comment: Glucose reference range applies only to samples taken after fasting for at least 8 hours.  Glucose, capillary     Status: Abnormal   Collection Time: 06/07/20  8:12 PM  Result Value Ref Range   Glucose-Capillary 182 (H) 70 - 99 mg/dL    Comment: Glucose reference range applies only to samples taken after fasting for at least 8 hours.  Glucose, capillary     Status: Abnormal   Collection Time: 06/08/20 12:44 AM  Result Value Ref Range   Glucose-Capillary 175 (H) 70 - 99 mg/dL    Comment: Glucose reference range applies only to samples taken after fasting for at least 8 hours.  Glucose, capillary     Status: Abnormal   Collection Time: 06/08/20  4:06 AM  Result Value Ref Range   Glucose-Capillary 188 (H) 70 - 99 mg/dL    Comment: Glucose reference range applies only to samples taken after fasting for at least 8 hours.  Basic metabolic panel     Status: Abnormal   Collection Time: 06/08/20  5:00 AM  Result Value Ref Range   Sodium 137 135 - 145 mmol/L   Potassium 4.2 3.5 - 5.1 mmol/L   Chloride 106 98 - 111 mmol/L   CO2 24 22 - 32 mmol/L   Glucose, Bld 245 (H) 70 - 99 mg/dL    Comment: Glucose reference range applies only to samples taken after fasting for at least 8 hours.   BUN 18 8 - 23 mg/dL   Creatinine, Ser 0.64 0.44 - 1.00 mg/dL   Calcium 8.2 (L) 8.9 - 10.3 mg/dL   GFR calc non Af Amer >60 >60 mL/min   GFR calc Af Amer >60 >60 mL/min   Anion gap  7 5 - 15    Comment: Performed at [facility],  [place] [address], [place], [place] [address]  Magnesium     Status: None   Collection Time: 06/08/20  5:00 AM  Result Value Ref Range   Magnesium 2.1 1.7 - 2.4 mg/dL    Comment: Performed at [facility], [place] [address], [place], [place] [address]  CBC with Differential/Platelet     Status: Abnormal   Collection Time: 06/08/20  5:00 AM  Result Value Ref Range   WBC 9.6 4.0 - 10.5 K/uL   RBC 2.66 (L) 3.87 - 5.11 MIL/uL   Hemoglobin 7.9 (L) 12.0 - 15.0 g/dL   HCT 26.2 (L) 36 - 46 %   MCV 98.5 80.0 - 100.0 fL   MCH 29.7 26.0 - 34.0 pg   MCHC 30.2 30.0 - 36.0 g/dL   RDW 17.2 (H) 11.5 - 15.5 %   Platelets 189 150 - 400 K/uL   nRBC 0.0 0.0 - 0.2 %   Neutrophils Relative % 67 %   Neutro Abs 6.5 1.7 - 7.7 K/uL   Lymphocytes Relative 23 %   Lymphs Abs 2.2 0.7 - 4.0 K/uL   Monocytes Relative 7 %   Monocytes Absolute 0.6 0 - 1 K/uL   Eosinophils Relative 2 %   Eosinophils Absolute 0.2 0 - 0 K/uL   Basophils Relative 0 %   Basophils Absolute 0.0 0 - 0 K/uL   Immature Granulocytes 1 %   Abs Immature Granulocytes 0.05 0.00 - 0.07 K/uL    Comment: Performed at [facility], [place] [address] [place], [place] [address]  Glucose, capillary     Status: Abnormal   Collection Time: 06/08/20  8:25 AM  Result Value Ref Range   Glucose-Capillary 166 (H) 70 - 99 mg/dL    Comment: Glucose reference range applies only to samples taken after fasting for at least 8 hours.    Radiology/Results: No results found.  Anti-infectives: Anti-infectives (From admission, onward)   Start     Dose/Rate Route Frequency Ordered Stop   06/07/20 1200  vancomycin (VANCOCIN) 50 mg/mL oral solution 125 mg     Discontinue     125 mg Oral Every 6 hours 06/07/20 1036     06/06/20 1615  metroNIDAZOLE (FLAGYL) IVPB 500 mg  Status:  Discontinued        500 mg 100 mL/hr over 60 Minutes Intravenous Every 8 hours 06/06/20 1605 06/07/20 1036   06/06/20 1400  piperacillin-tazobactam (ZOSYN) IVPB 3.375 g     Discontinue      3.375 g 100 mL/hr over 30 Minutes Intravenous Every 8 hours 06/06/20 1324 06/11/20 1359   06/06/20 0945  polymyxin B 500,000 Units, bacitracin 50,000 Units in sodium chloride 0.9 % 500 mL irrigation  Status:  Discontinued          As needed 06/06/20 0945 06/06/20 1242   06/06/20 0800  cefoTEtan (CEFOTAN) 2 g in sodium chloride 0.9 % 100 mL IVPB        2 g 200 mL/hr over 30 Minutes Intravenous On call to O.R. 06/05/20 0831 06/06/20 1030   06/02/20 1030  vancomycin (VANCOCIN) 50 mg/mL oral solution 125 mg  Status:  Discontinued        125 mg Oral 3 times daily before meals & bedtime 06/02/20 0822 06/06/20 1605   06/01/20 1130  metroNIDAZOLE (FLAGYL) tablet 500 mg  Status:  Discontinued  500 mg Oral 3 times daily 06/01/20 1124 06/02/20 6789      Assessment/Plan: Problem List: Patient Active Problem List   Diagnosis Date Noted  . Malnutrition of moderate degree 06/05/2020  . Hypokalemia 06/01/2020  . Abdominal pain 05/17/2020  . Hyponatremia 05/17/2020  . Diarrhea 05/17/2020  . C. difficile colitis 05/17/2020  . Hemorrhagic shock ([place])   . Peripheral arterial occlusive disease ([place]) 04/18/2020  . Shock ([place]) 04/18/2020  . Hypertension   . Hypercholesteremia   . GERD (gastroesophageal reflux disease)   . Depressive disorder   . Congestive heart failure (CHF) ([place])   . Back pain   . Acute deep vein thrombosis (DVT) of proximal vein of right lower extremity ([place]) 08/27/2017  . Closed fracture of lumbar vertebra ([place]) 05/31/2017  . S/P lumbar fusion 04/21/2017  . Lumbar radiculopathy 02/25/2017    Transverse ostomy; VAC in place--nutrition in progress 2 Days Post-Op    LOS: 7 days   Matt B. Hassell Done, MD, [facility] [phone] to reach the surgeon on call.    06/08/2020 9:45 AM

## 2020-06-08 NOTE — Progress Notes (Signed)
Physical Therapy Treatment Patient Details Name: Tonya Hughes MRN: [id number] DOB: [birth year] Today's Date: 06/08/2020    History of Present Illness Tonya Hughes is an 69 y.o. female past medical history of chronic diastolic heart failure, diabetes mellitus DVT essential hypertension status post aortobifemoral bypass on 04/18/2020 recently discharged from the hospital for C. difficile colitis on 05/30/2020 who presents to the hospital with ongoing abdominal pain.  During her recent admission she was transfused 2 units of packed red blood cells due to rectal bleeding GI was consulted who perform a flex sig on 05/20/2020 that showed colonic stricture with pathology consistent with ischemic colitis. Colostomy placed 8/5 with wound vac    PT Comments    Pt continues to refuse EOB or OOB mobility.  She wants her pain to go away before she starts to work on mobility.  She was agreeable to repositioning, changing of wet bed pad, but refused ROM or incentive spirometer practice.  She is very self limiting.  RN made aware of refusal.  PT will continue to attempt to encourage mobility.  Our best attempts would likely be with pre-medication.  She wants to go home, but she does not want to move until her pain is better which is contradictory.  Education provided on the importance of getting up in her post surgical recovery and bowl function return.  PT will continue to follow acutely for safe mobility progression.  Follow Up Recommendations  SNF     Equipment Recommendations  Hospital bed    Recommendations for Other Services       Precautions / Restrictions Precautions Precautions: Fall;Other (comment) Precaution Comments: abdominal precautions, wound vac and ostomy Restrictions Weight Bearing Restrictions: No    Mobility  Bed Mobility Overal bed mobility: Needs Assistance Bed Mobility: Rolling Rolling: Mod assist;+2 for physical assistance         General bed mobility comments: Pt  agreeable to roll bil for pericare and pad placement d/t wet pads.  cues for log roll and knee flexion to help protect her stomach and pt using railing heavily.    Transfers                 General transfer comment: Pt refusing EOB or OOB mobility becoming angry at the suggestion.  "i'm not ready!"  Ambulation/Gait                 Stairs             Wheelchair Mobility    Modified Rankin (Stroke Patients Only)       Balance                                            Cognition Arousal/Alertness: Awake/alert Behavior During Therapy: WFL for tasks assessed/performed Overall Cognitive Status: No family/caregiver present to determine baseline cognitive functioning                                 General Comments: Pt has poor understanding of importance of mobility.  She would like to have no pain before she attempts to move EOB or OOB.        Exercises      General Comments General comments (skin integrity, edema, etc.): Pt refused ROM, practice of incentive spirometer, repositioned on pillows for pressure relief and  educated on bracing abdomen with pillow during mobility/coughing to help with pain.       Pertinent Vitals/Pain Pain Assessment: Faces Pain Score: 7  Faces Pain Scale: Hurts whole lot Pain Location: abdomen Pain Descriptors / Indicators: Sore;Moaning Pain Intervention(s): Limited activity within patient's tolerance;Monitored during session;Repositioned;Patient requesting pain meds-RN notified    Home Living Family/patient expects to be discharged to:: Skilled nursing facility Living Arrangements: Spouse/significant other Available Help at Discharge: Family;Available PRN/intermittently Type of Home: House   Entrance Stairs-Rails: Right;Left Home Layout: One level Home Equipment: Shower seat;Bedside commode;Walker - 4 wheels;Wheelchair - manual      Prior Function Level of Independence: Independent with  assistive device(s)      Comments: pt is questionable historian, may have been at SNF Prior to admin   PT Goals (current goals can now be found in the care plan section) Acute Rehab PT Goals Patient Stated Goal: to go home Progress towards PT goals: Not progressing toward goals - comment (pt is very self limiting)    Frequency    Min 2X/week      PT Plan Current plan remains appropriate    Co-evaluation PT/OT/SLP Co-Evaluation/Treatment: Yes Reason for Co-Treatment: Other (comment);For patient/therapist safety;To address functional/ADL transfers (pt would not tolerate separate sessions d/t pain) PT goals addressed during session: Strengthening/ROM        AM-PAC PT "6 Clicks" Mobility   Outcome Measure  Help needed turning from your back to your side while in a flat bed without using bedrails?: A Lot Help needed moving from lying on your back to sitting on the side of a flat bed without using bedrails?: Total Help needed moving to and from a bed to a chair (including a wheelchair)?: Total Help needed standing up from a chair using your arms (e.g., wheelchair or bedside chair)?: Total Help needed to walk in hospital room?: Total Help needed climbing 3-5 steps with a railing? : Total 6 Click Score: 7    End of Session   Activity Tolerance: Patient limited by pain Patient left: in bed;with call bell/phone within reach;with bed alarm set Nurse Communication: Mobility status;Other (comment);Patient requests pain meds (refusal of EOB/OOB mobility) PT Visit Diagnosis: Other abnormalities of gait and mobility (R26.89);Difficulty in walking, not elsewhere classified (R26.2);Muscle weakness (generalized) (M62.81);Other symptoms and signs involving the nervous system (R29.898);Pain Pain - Right/Left:  (mid) Pain - part of body:  (abdomen)     Time: 9798-9211 PT Time Calculation (min) (ACUTE ONLY): 19 min  Charges:  $Therapeutic Activity: 8-22 mins                      Verdene Lennert, PT, DPT  Acute Rehabilitation [phone] pager [phone]) [phone] office

## 2020-06-08 NOTE — Progress Notes (Signed)
PROGRESS NOTE    Tonya Hughes  BPZ:[id number] DOB: November 01, 1951 DOA: 05/31/2020 PCP: Monico Blitz, MD   Brief Narrative:   Tonya Hughes Nanceis an 69 y.o.femalepast medical history of chronic diastolic heart failure, diabetes mellitus DVT essential hypertension status post aortobifemoral bypass on 04/18/2020 recently discharged from the hospital for C. difficile colitis on 05/30/2020 who presents to the hospital with ongoing abdominal pain. During her recent admission she was transfused 2 units of packed red blood cells due to rectal bleeding GI was consulted who perform a flex sig on 05/20/2020 that showed colonic stricture with pathology consistent with ischemic colitis. During this time GI recommended surgical consult which the patient refused at that time. Since discharge she is of been having worsening abdominal pain and diarrhea,she isnowamenable to surgery.  8/7: S/p left colectomy w/ colostomy. On CLD. Says she is nauseous. Has nausea medicine on order but she is not requesting. C/o pain. She is not use the full array of pain meds available to her. She does not want to work with PT or OT.    Assessment & Plan: Readmitted for abdominal pain due to sigmoid colonic stricture causing partial small bowel obstruction - Abdominal x-ray done on 06/01/2020 showed partial small bowel obstruction.  - Barium enema demonstrated a long smooth stricture from the descending colon to the rectum on her previous admission. - she is on TPN - s/p left colectomy w/ colostomy; appreciate surgery assistance     - 8/7: Remains on CLD. Remains on PO vanc. She needs to mobilize. She c/o nausea and pain. She has medicines onboard for both that she is not fully availing herself of.   History of of C. Dif. colitis - She tested positive for C. difficile on 05/17/2020, still having some diarrhea, Flagyl is not optimal for treatment of C. difficile colitis.  - She was being noncompliant with  her Flagyl as an outpatient. - Started on oral vancomycin and will continue total treatment of 10 days. - diarrhea is improved, follow, maintain precautions - 8/7: continue PO vanc  Vaginal candidiasis - nystatin  Hypovolemic hyponatremia: - resolved on fluids  Hypokalemia Hypomagnesemia -K+/Mg2+ replaced. Follow  AKI - resolved, follow  Hx of PVD status post aortobifemoral bypass: - aspirin and statins.  Essential hypertension Chronic diastolic heart failure - lisinopril, coreg - follow I&O, daily wts  DMt2 - A1c of 6.9. - she is on TPN     - CLD started; increase SSI to mod lvl  Left heel unstageable sacral decubitus ulcer present on admission: - RN Pressure Injury Documentation: - Pressure Injury 04/27/20 Heel Left Deep Tissue Pressure Injury - Purple or maroon localized area of discolored intact skin or blood-filled blister due to damage of underlying soft tissue from pressure and/or shear. (Active) 04/27/20 2110 Location: Heel Location Orientation: Left Staging: Deep Tissue Pressure Injury - Purple or maroon localized area of discolored intact skin or blood-filled blister due to damage of underlying soft tissue from pressure and/or shear. Wound Description (Comments):  Present on Admission:  DVT prophylaxis: heparin Code Status: DNR Family Communication: None at bedside   Status is: Inpatient  Remains inpatient appropriate because:Inpatient level of care appropriate due to severity of illness   Dispo: The patient is from: Home              Anticipated d/c is to: SNF              Anticipated d/c date is: > 3 days  Patient currently is not medically stable to d/c.  Consultants:   General surgery  Procedures:   Left colectomy, colostomy placement  Antimicrobials:  . Vancomycin   ROS:  Reports nause, pain. Remainder ROS is negative for all not previously  mentioned.  Subjective: "I don't feel good."  Objective: Vitals:   06/08/20 0046 06/08/20 0411 06/08/20 0828 06/08/20 1121  BP: (!) 143/47 (!) 149/54 (!) 158/69 (!) 153/52  Pulse: 74 69 71 71  Resp: 18 18 18 18   Temp: 98.6 F (37 C) 98.2 F (36.8 C) 98.1 F (36.7 C)   TempSrc: Axillary Axillary Oral   SpO2: 97% 97% 99% 99%  Weight:      Height:        Intake/Output Summary (Last 24 hours) at 06/08/2020 1407 Last data filed at 06/08/2020 0902 Gross per 24 hour  Intake 1495.33 ml  Output 550 ml  Net 945.33 ml   Filed Weights   06/06/20 0042 06/06/20 0809 06/07/20 0430  Weight: 69.8 kg 69.8 kg 71.4 kg    Examination:  General: 69 y.o. female resting in bed in NAD Cardiovascular: RRR, +S1, S2, no m/g/r, equal pulses throughout Respiratory: CTABL, no w/r/r, normal WOB GI: BS+, ND, ostomy note, TTP LUQ/LLQ MSK: No e/c/c Neuro: alert to name, follows commands Psyc: Appropriate interaction and affect   Data Reviewed: I have personally reviewed following labs and imaging studies.  CBC: Recent Labs  Lab 06/02/20 0708 06/05/20 0410 06/06/20 0456 06/07/20 0500 06/08/20 0500  WBC 6.1 5.9 5.5 9.2 9.6  NEUTROABS  --  3.2 2.6  --  6.5  HGB 8.9* 9.6* 10.1* 9.4* 7.9*  HCT 29.2* 30.1* 32.3* 30.8* 26.2*  MCV 95.4 95.3 96.7 96.9 98.5  PLT 271 245 230 217 923   Basic Metabolic Panel: Recent Labs  Lab 06/02/20 0708 06/05/20 0410 06/06/20 0456 06/07/20 0500 06/08/20 0500  NA 137 137 137 135 137  K 3.7 3.5 5.1 4.2 4.2  CL 104 101 101 105 106  CO2 27 28 29 24 24   GLUCOSE 91 138* 152* 232* 245*  BUN 9 6* 9 20 18   CREATININE 1.03* 0.70 0.67 0.66 0.64  CALCIUM 8.0* 8.1* 8.4* 8.0* 8.2*  MG  --  1.6* 2.2 1.8 2.1  PHOS  --  3.4 4.1 3.4  --    GFR: Estimated Creatinine Clearance: 64.6 mL/min (by C-G formula based on SCr of 0.64 mg/dL). Liver Function Tests: Recent Labs  Lab 06/05/20 0410 06/06/20 0456  AST 13* 14*  ALT 8 9  ALKPHOS 39 40  BILITOT 0.4 0.6  PROT  5.5* 6.0*  ALBUMIN 2.0* 2.1*   No results for input(s): LIPASE, AMYLASE in the last 168 hours. No results for input(s): AMMONIA in the last 168 hours. Coagulation Profile: No results for input(s): INR, PROTIME in the last 168 hours. Cardiac Enzymes: No results for input(s): CKTOTAL, CKMB, CKMBINDEX, TROPONINI in the last 168 hours. BNP (last 3 results) No results for input(s): PROBNP in the last 8760 hours. HbA1C: No results for input(s): HGBA1C in the last 72 hours. CBG: Recent Labs  Lab 06/07/20 2012 06/08/20 0044 06/08/20 0406 06/08/20 0825 06/08/20 1130  GLUCAP 182* 175* 188* 166* 155*   Lipid Profile: No results for input(s): CHOL, HDL, LDLCALC, TRIG, CHOLHDL, LDLDIRECT in the last 72 hours. Thyroid Function Tests: No results for input(s): TSH, T4TOTAL, FREET4, T3FREE, THYROIDAB in the last 72 hours. Anemia Panel: No results for input(s): VITAMINB12, FOLATE, FERRITIN, TIBC, IRON, RETICCTPCT in the last 72  hours. Sepsis Labs: Recent Labs  Lab 06/01/20 1637  LATICACIDVEN 0.9    Recent Results (from the past 240 hour(s))  SARS Coronavirus 2 by RT PCR (hospital order, performed in [facility] hospital lab) Nasopharyngeal Nasopharyngeal Swab     Status: None   Collection Time: 06/01/20 12:17 PM   Specimen: Nasopharyngeal Swab  Result Value Ref Range Status   SARS Coronavirus 2 NEGATIVE NEGATIVE Final    Comment: (NOTE) SARS-CoV-2 target nucleic acids are NOT DETECTED.  The SARS-CoV-2 RNA is generally detectable in upper and lower respiratory specimens during the acute phase of infection. The lowest concentration of SARS-CoV-2 viral copies this assay can detect is 250 copies / mL. A negative result does not preclude SARS-CoV-2 infection and should not be used as the sole basis for treatment or other patient management decisions.  A negative result may occur with improper specimen collection / handling, submission of specimen other than nasopharyngeal swab, presence  of viral mutation(s) within the areas targeted by this assay, and inadequate number of viral copies (<250 copies / mL). A negative result must be combined with clinical observations, patient history, and epidemiological information.  Fact Sheet for Patients:   StrictlyIdeas.no  Fact Sheet for Healthcare Providers: BankingDealers.co.za  This test is not yet approved or  cleared by the [place] FDA and has been authorized for detection and/or diagnosis of SARS-CoV-2 by FDA under an Emergency Use Authorization (EUA).  This EUA will remain in effect (meaning this test can be used) for the duration of the COVID-19 declaration under Section 564(b)(1) of the Act, 21 U.S.C. section 360bbb-3(b)(1), unless the authorization is terminated or revoked sooner.  Performed at [facility], [place] [address], [place], [place] [address]   Culture, blood (routine x 2)     Status: None   Collection Time: 06/01/20 12:51 PM   Specimen: BLOOD  Result Value Ref Range Status   Specimen Description BLOOD RIGHT ANTECUBITAL  Final   Special Requests   Final    BOTTLES DRAWN AEROBIC AND ANAEROBIC Blood Culture adequate volume   Culture   Final    NO GROWTH 5 DAYS Performed at [facility], [place] [address], [place], [place] [address]    Report Status 06/06/2020 FINAL  Final  Culture, blood (routine x 2)     Status: None   Collection Time: 06/01/20  4:37 PM   Specimen: BLOOD  Result Value Ref Range Status   Specimen Description BLOOD RIGHT ANTECUBITAL  Final   Special Requests   Final    BOTTLES DRAWN AEROBIC ONLY Blood Culture adequate volume   Culture   Final    NO GROWTH 5 DAYS Performed at [facility], [place] [address] [place], [place] [address]    Report Status 06/06/2020 FINAL  Final      Radiology Studies: No results found.   Scheduled Meds: . acetaminophen  1,000 mg Oral Q6H  . aspirin EC  81 mg Oral Daily  .  atorvastatin  40 mg Oral Daily  . carvedilol  3.125 mg Oral BID WC  . Chlorhexidine Gluconate Cloth  6 each Topical Daily  . feeding supplement  237 mL Oral BID BM  . heparin injection (subcutaneous)  5,000 Units Subcutaneous Q8H  . insulin aspart  0-15 Units Subcutaneous Q4H  . lisinopril  5 mg Oral Daily  . methocarbamol  500 mg Oral TID  . nystatin cream   Topical BID  . PARoxetine  20 mg  Oral Daily  . saccharomyces boulardii  250 mg Oral BID  . sodium chloride flush  10-40 mL Intracatheter Q12H  . sodium chloride flush  3 mL Intravenous Once  . sodium chloride flush  3 mL Intravenous Q12H  . vancomycin  125 mg Oral Q6H   Continuous Infusions: . sodium chloride 250 mL (06/05/20 1058)  . sodium chloride 75 mL/hr at 06/08/20 0337  . lactated ringers 10 mL/hr at 06/06/20 0815  . piperacillin-tazobactam Stopped (06/08/20 0700)  . TPN ADULT (ION) 80 mL/hr at 06/07/20 1809  . TPN ADULT (ION)       LOS: 7 days    Time spent: 25 minutes spent in the coordination of care today.   Jonnie Finner, DO [facility]  If 7PM-7AM, please contact night-coverage [email or web address] 06/08/2020, 2:07 PM

## 2020-06-09 LAB — COMPREHENSIVE METABOLIC PANEL
ALT: 11 U/L (ref 0–44)
AST: 16 U/L (ref 15–41)
Albumin: 1.4 g/dL — ABNORMAL LOW (ref 3.5–5.0)
Alkaline Phosphatase: 41 U/L (ref 38–126)
Anion gap: 8 (ref 5–15)
BUN: 15 mg/dL (ref 8–23)
CO2: 24 mmol/L (ref 22–32)
Calcium: 7.8 mg/dL — ABNORMAL LOW (ref 8.9–10.3)
Chloride: 105 mmol/L (ref 98–111)
Creatinine, Ser: 0.6 mg/dL (ref 0.44–1.00)
GFR calc Af Amer: >60 mL/min (ref >60)
GFR calc non Af Amer: >60 mL/min (ref >60)
Glucose, Bld: 151 mg/dL — ABNORMAL HIGH (ref 70–99)
Potassium: 3.6 mmol/L (ref 3.5–5.1)
Sodium: 137 mmol/L (ref 135–145)
Total Bilirubin: 0.2 mg/dL — ABNORMAL LOW (ref 0.3–1.2)
Total Protein: 4.9 g/dL — ABNORMAL LOW (ref 6.5–8.1)

## 2020-06-09 LAB — RENAL FUNCTION PANEL
Albumin: 1.3 g/dL — ABNORMAL LOW (ref 3.5–5.0)
Anion gap: 9 (ref 5–15)
BUN: 14 mg/dL (ref 8–23)
CO2: 22 mmol/L (ref 22–32)
Calcium: 8 mg/dL — ABNORMAL LOW (ref 8.9–10.3)
Chloride: 101 mmol/L (ref 98–111)
Creatinine, Ser: 0.66 mg/dL (ref 0.44–1.00)
GFR calc Af Amer: >60 mL/min (ref >60)
GFR calc non Af Amer: >60 mL/min (ref >60)
Glucose, Bld: 946 mg/dL (ref 70–99)
Phosphorus: 5.9 mg/dL — ABNORMAL HIGH (ref 2.5–4.6)
Potassium: 5.2 mmol/L — ABNORMAL HIGH (ref 3.5–5.1)
Sodium: 132 mmol/L — ABNORMAL LOW (ref 135–145)

## 2020-06-09 LAB — CBC WITH DIFFERENTIAL/PLATELET
Abs Immature Granulocytes: 0.04 10*3/uL (ref 0.00–0.07)
Basophils Absolute: 0 10*3/uL (ref 0.0–0.1)
Basophils Relative: 0 %
Eosinophils Absolute: 0.4 10*3/uL (ref 0.0–0.5)
Eosinophils Relative: 6 %
HCT: 24.3 % — ABNORMAL LOW (ref 36.0–46.0)
Hemoglobin: 7.1 g/dL — ABNORMAL LOW (ref 12.0–15.0)
Immature Granulocytes: 1 %
Lymphocytes Relative: 27 %
Lymphs Abs: 1.8 10*3/uL (ref 0.7–4.0)
MCH: 30.7 pg (ref 26.0–34.0)
MCHC: 29.2 g/dL — ABNORMAL LOW (ref 30.0–36.0)
MCV: 105.2 fL — ABNORMAL HIGH (ref 80.0–100.0)
Monocytes Absolute: 0.5 10*3/uL (ref 0.1–1.0)
Monocytes Relative: 7 %
Neutro Abs: 4.1 10*3/uL (ref 1.7–7.7)
Neutrophils Relative %: 59 %
Platelets: 182 10*3/uL (ref 150–400)
RBC: 2.31 MIL/uL — ABNORMAL LOW (ref 3.87–5.11)
RDW: 17.5 % — ABNORMAL HIGH (ref 11.5–15.5)
WBC: 6.9 10*3/uL (ref 4.0–10.5)
nRBC: 0 % (ref 0.0–0.2)

## 2020-06-09 LAB — GLUCOSE, CAPILLARY
Glucose-Capillary: 131 mg/dL — ABNORMAL HIGH (ref 70–99)
Glucose-Capillary: 133 mg/dL — ABNORMAL HIGH (ref 70–99)
Glucose-Capillary: 143 mg/dL — ABNORMAL HIGH (ref 70–99)
Glucose-Capillary: 146 mg/dL — ABNORMAL HIGH (ref 70–99)
Glucose-Capillary: 153 mg/dL — ABNORMAL HIGH (ref 70–99)
Glucose-Capillary: 155 mg/dL — ABNORMAL HIGH (ref 70–99)

## 2020-06-09 LAB — MAGNESIUM
Magnesium: 2.5 mg/dL — ABNORMAL HIGH (ref 1.7–2.4)
Magnesium: 2 mg/dL (ref 1.7–2.4)

## 2020-06-09 LAB — PHOSPHORUS: Phosphorus: 3.5 mg/dL (ref 2.5–4.6)

## 2020-06-09 MED ORDER — TRAVASOL 10 % IV SOLN
INTRAVENOUS | Status: AC
  Filled 2020-06-09: qty 960

## 2020-06-09 MED ORDER — LISINOPRIL 10 MG PO TABS
10.0000 mg | ORAL_TABLET | Freq: Every day | ORAL | Status: DC
  Administered 2020-06-10: 10 mg via ORAL
  Filled 2020-06-09: qty 1

## 2020-06-09 MED ORDER — POTASSIUM CHLORIDE 10 MEQ/50ML IV SOLN
10.0000 meq | INTRAVENOUS | Status: AC
  Administered 2020-06-09 (×4): 10 meq via INTRAVENOUS
  Filled 2020-06-09 (×4): qty 50

## 2020-06-09 NOTE — TOC Progression Note (Signed)
Transition of Care [facility]) - Progression Note    Patient Details  Name: Tonya Hughes MRN: [id number] Date of Birth: [birth year]  Transition of Care [facility]) CM/SW [place], [place] Phone Number: [phone] 06/09/2020, 3:12 PM  Clinical Narrative:     CSw checked and insurance is still pening. CSW noted that referral was sent on 06/07/20 and patient has only bed offer.  TOC team will continue to assist with discharge planning needs.  Expected Discharge Plan: [place] Barriers to Discharge: Continued Medical Work up  Expected Discharge Plan and Services Expected Discharge Plan: [place] arrangements for the past 2 months: Single Family Home                                       Social Determinants of Health (SDOH) Interventions    Readmission Risk Interventions Readmission Risk Prevention Plan 06/07/2020  Social Work Consult for [place] Planning/Counseling Complete  Some recent data might be hidden

## 2020-06-09 NOTE — Progress Notes (Signed)
PHARMACY - TOTAL PARENTERAL NUTRITION CONSULT NOTE   Indication: Small bowel obstruction and Malnutrition  Patient Measurements: Height: 5' 3"  (160 cm) Weight: 76.2 kg (168 lb) IBW/kg (Calculated) : 52.4 TPN AdjBW (KG): 56.8 Body mass index is 29.76 kg/m.  Assessment: 79 YOF readmitted with abdominal pain and found to have sigmoid colonic stricture causing partial small bowel obstruction. Per surgery team, patient is taking almost no full liquids and only drinking juice. A soft diet has been ordered but surgery thinks that based on patient's history, she will not be able to tolerate solid foods. Pre-albumin today is low at 10.2.   8/8: first set of labs thought to be lab error  Glucose / Insulin: A1c 6.9, Patient received one dose of dexamethasone 5 mg on 8/5. CBGs improved to 133-166 on mSSI, 11 units insulin in last 24 hours  Electrolytes: K 3.6, Mg 2, other lytes wnl, Home oral potassium supplements d/c'ed  Renal: SCr normalized   LFTs / TGs: LFTs wnl, Tbili wnl  Prealbumin / albumin: Pre-albumin 10.2, Alb 2.1 > 1.4 Intake / Output; MIVF: NS@ 75 ml/hr, UOP 0.8 mL/kg/hr  GI Imaging: Surgeries / Procedures:  8/5: Extended left colectomy + sigmoid colectomy and placement of colostomy  Central access: PICC 8/3>> TPN start date: 8/3   Nutritional Goals (Per RD recommendations on 8/3): kCal: 1900-2100, Protein: 90-105g, Fluid:  Goal TPN rate is 80 mL/hr (provides 96 g of protein and 2066 kcals per day)  Current Nutrition:  Clear liquids started - RN reports ~25% consumed last night TPN   Plan:  Continue TPN at goal rate of 71m/hr at 1800 Electrolytes in TPN: 761m/L of Na, cont to 3045mL of K, 5mE39m of Ca, cont to 10mE54mof Mg, and 15mmo46mof Phos. Cont Cl:Ac ratio of 1:1 Add standard MVI and trace elements to TPN Change moderate SSI to q4h and adjust as needed  Monitor TPN labs on Mon/Thurs   KCl 10 meq IV x4 doses  Thank you for involving pharmacy in this patient's  care.  JennifRenold GentamD, BCPS Clinical Pharmacist Clinical phone for 06/09/2020 until 3p is [phone] 7:14 AM  **Pharmacist phone directory can be found on amion.[place] under [facility]

## 2020-06-09 NOTE — Progress Notes (Signed)
AM CMP redrawn due to questionable results. Jessie Foot, RN

## 2020-06-09 NOTE — Progress Notes (Signed)
PROGRESS NOTE    Tonya Hughes  SJG:[id number] DOB: 10/22/1951 DOA: 05/31/2020 PCP: Monico Blitz, MD   Brief Narrative:   Tonya Sarin Nanceis an 69 y.o.femalepast medical history of chronic diastolic heart failure, diabetes mellitus DVT essential hypertension status post aortobifemoral bypass on 04/18/2020 recently discharged from the hospital for C. difficile colitis on 05/30/2020 who presents to the hospital with ongoing abdominal pain. During her recent admission she was transfused 2 units of packed red blood cells due to rectal bleeding GI was consulted who perform a flex sig on 05/20/2020 that showed colonic stricture with pathology consistent with ischemic colitis. During this time GI recommended surgical consult which the patient refused at that time. Since discharge she is of been having worsening abdominal pain and diarrhea,she isnowamenable to surgery.  8/8: No acute events ON. Remains angry about everything. Continue abx. Needs to work with PT. Appreciate surgery assistance.   Assessment & Plan: Readmitted for abdominal pain due to sigmoid colonic stricture causing partial small bowel obstruction - Abdominal x-ray done on 06/01/2020 showed partial small bowel obstruction.  - Barium enema demonstrated a long smooth stricture from the descending colon to the rectum on her previous admission. - she is on TPN - s/p left colectomy w/ colostomy; appreciate surgery assistance - 8/8: tentative stop date for vanc is 8/11. She needs to mobilize and work w/ IS. She will need SNF placement.   History of of C. Dif. colitis - She tested positive for C. difficile on 05/17/2020, still having some diarrhea, Flagyl is not optimal for treatment of C. difficile colitis.  - She was being noncompliant with her Flagyl as an outpatient. - Started on oral vancomycin and will continue total treatment of 10 days. - diarrhea is improved, follow, maintain  precautions - 8/8: tentative stop date for vanc is 8/11  Vaginal candidiasis - nystatin  Hypovolemic hyponatremia: - resolved on fluids  Hypokalemia Hypomagnesemia -K+/Mg2+ replaced. Follow  AKI - resolved, follow  Hx of PVD status post aortobifemoral bypass: - aspirin and statins.  Essential hypertension Chronic diastolic heart failure - lisinopril, coreg     - increase lisinopril to 77m - follow I&O, daily wts  DMt2 - A1c of 6.9. - she is on TPN, CLD     - SSI; follow glucose  Left heel unstageable sacral decubitus ulcer present on admission: - RN Pressure Injury Documentation: - Pressure Injury 04/27/20 Heel Left Deep Tissue Pressure Injury - Purple or maroon localized area of discolored intact skin or blood-filled blister due to damage of underlying soft tissue from pressure and/or shear. (Active) 04/27/20 2110 Location: Heel Location Orientation: Left Staging: Deep Tissue Pressure Injury - Purple or maroon localized area of discolored intact skin or blood-filled blister due to damage of underlying soft tissue from pressure and/or shear. Wound Description (Comments):  Present on Admission:  DVT prophylaxis: heparin Code Status: DNR Family Communication: None at bedside   Status is: Inpatient  Remains inpatient appropriate because:Inpatient level of care appropriate due to severity of illness   Dispo: The patient is from: Home              Anticipated d/c is to: SNF              Anticipated d/c date is: 3 days              Patient currently is not medically stable to d/c.  Consultants:   General Surgery  Procedures:   Left colectomy, colostomy placement  Antimicrobials:  .  Vancomycin, zosyn   Subjective: Agitation ON  Objective: Vitals:   06/09/20 0029 06/09/20 0431 06/09/20 0756 06/09/20 1141  BP: (!) 158/54 (!) 154/52 (!) 151/53 (!) 163/62  Pulse: 66 71 71 92  Resp: 17 16 16 16    Temp: 97.7 F (36.5 C) 97.6 F (36.4 C) 97.8 F (36.6 C) 98 F (36.7 C)  TempSrc: Oral Oral Oral Oral  SpO2: 99% 99% 99% 98%  Weight:  76.2 kg    Height:        Intake/Output Summary (Last 24 hours) at 06/09/2020 1312 Last data filed at 06/09/2020 0908 Gross per 24 hour  Intake 183 ml  Output 1950 ml  Net -1767 ml   Filed Weights   06/06/20 0809 06/07/20 0430 06/09/20 0431  Weight: 69.8 kg 71.4 kg 76.2 kg    Examination:  General: 69 y.o. female resting in bed in NAD Cardiovascular: RRR, +S1, S2, no m/g/r, equal pulses throughout Respiratory: CTABL, no w/r/r, normal WOB GI: BS+, TTP LLQ/LUQ, ostomy noted MSK: No e/c/c Neuro: Alert to name, follows commands  Data Reviewed: I have personally reviewed following labs and imaging studies.  CBC: Recent Labs  Lab 06/05/20 0410 06/06/20 0456 06/07/20 0500 06/08/20 0500 06/09/20 0500  WBC 5.9 5.5 9.2 9.6 6.9  NEUTROABS 3.2 2.6  --  6.5 4.1  HGB 9.6* 10.1* 9.4* 7.9* 7.1*  HCT 30.1* 32.3* 30.8* 26.2* 24.3*  MCV 95.3 96.7 96.9 98.5 105.2*  PLT 245 230 217 189 749   Basic Metabolic Panel: Recent Labs  Lab 06/05/20 0410 06/05/20 0410 06/06/20 0456 06/07/20 0500 06/08/20 0500 06/09/20 0500 06/09/20 0633  NA 137   < > 137 135 137 132* 137  K 3.5   < > 5.1 4.2 4.2 5.2* 3.6  CL 101   < > 101 105 106 101 105  CO2 28   < > 29 24 24 22 24   GLUCOSE 138*   < > 152* 232* 245* 946* 151*  BUN 6*   < > 9 20 18 14 15   CREATININE 0.70   < > 0.67 0.66 0.64 0.66 0.60  CALCIUM 8.1*   < > 8.4* 8.0* 8.2* 8.0* 7.8*  MG 1.6*   < > 2.2 1.8 2.1 2.5* 2.0  PHOS 3.4  --  4.1 3.4  --  5.9* 3.5   < > = values in this interval not displayed.   GFR: Estimated Creatinine Clearance: 66.7 mL/min (by C-G formula based on SCr of 0.6 mg/dL). Liver Function Tests: Recent Labs  Lab 06/05/20 0410 06/06/20 0456 06/09/20 0500 06/09/20 0633  AST 13* 14*  --  16  ALT 8 9  --  11  ALKPHOS 39 40  --  41  BILITOT 0.4 0.6  --  0.2*  PROT 5.5*  6.0*  --  4.9*  ALBUMIN 2.0* 2.1* 1.3* 1.4*   No results for input(s): LIPASE, AMYLASE in the last 168 hours. No results for input(s): AMMONIA in the last 168 hours. Coagulation Profile: No results for input(s): INR, PROTIME in the last 168 hours. Cardiac Enzymes: No results for input(s): CKTOTAL, CKMB, CKMBINDEX, TROPONINI in the last 168 hours. BNP (last 3 results) No results for input(s): PROBNP in the last 8760 hours. HbA1C: No results for input(s): HGBA1C in the last 72 hours. CBG: Recent Labs  Lab 06/08/20 2039 06/09/20 0026 06/09/20 0430 06/09/20 0753 06/09/20 1136  GLUCAP 141* 155* 133* 153* 131*   Lipid Profile: No results for input(s): CHOL, HDL, LDLCALC, TRIG, CHOLHDL,  LDLDIRECT in the last 72 hours. Thyroid Function Tests: No results for input(s): TSH, T4TOTAL, FREET4, T3FREE, THYROIDAB in the last 72 hours. Anemia Panel: No results for input(s): VITAMINB12, FOLATE, FERRITIN, TIBC, IRON, RETICCTPCT in the last 72 hours. Sepsis Labs: No results for input(s): PROCALCITON, LATICACIDVEN in the last 168 hours.  Recent Results (from the past 240 hour(s))  SARS Coronavirus 2 by RT PCR (hospital order, performed in [facility] hospital lab) Nasopharyngeal Nasopharyngeal Swab     Status: None   Collection Time: 06/01/20 12:17 PM   Specimen: Nasopharyngeal Swab  Result Value Ref Range Status   SARS Coronavirus 2 NEGATIVE NEGATIVE Final    Comment: (NOTE) SARS-CoV-2 target nucleic acids are NOT DETECTED.  The SARS-CoV-2 RNA is generally detectable in upper and lower respiratory specimens during the acute phase of infection. The lowest concentration of SARS-CoV-2 viral copies this assay can detect is 250 copies / mL. A negative result does not preclude SARS-CoV-2 infection and should not be used as the sole basis for treatment or other patient management decisions.  A negative result may occur with improper specimen collection / handling, submission of specimen  other than nasopharyngeal swab, presence of viral mutation(s) within the areas targeted by this assay, and inadequate number of viral copies (<250 copies / mL). A negative result must be combined with clinical observations, patient history, and epidemiological information.  Fact Sheet for Patients:   StrictlyIdeas.no  Fact Sheet for Healthcare Providers: BankingDealers.co.za  This test is not yet approved or  cleared by the [place] FDA and has been authorized for detection and/or diagnosis of SARS-CoV-2 by FDA under an Emergency Use Authorization (EUA).  This EUA will remain in effect (meaning this test can be used) for the duration of the COVID-19 declaration under Section 564(b)(1) of the Act, 21 U.S.C. section 360bbb-3(b)(1), unless the authorization is terminated or revoked sooner.  Performed at [facility], [place] [address], [place], [place] [address]   Culture, blood (routine x 2)     Status: None   Collection Time: 06/01/20 12:51 PM   Specimen: BLOOD  Result Value Ref Range Status   Specimen Description BLOOD RIGHT ANTECUBITAL  Final   Special Requests   Final    BOTTLES DRAWN AEROBIC AND ANAEROBIC Blood Culture adequate volume   Culture   Final    NO GROWTH 5 DAYS Performed at [facility], [place] [address], [place], [place] [address]    Report Status 06/06/2020 FINAL  Final  Culture, blood (routine x 2)     Status: None   Collection Time: 06/01/20  4:37 PM   Specimen: BLOOD  Result Value Ref Range Status   Specimen Description BLOOD RIGHT ANTECUBITAL  Final   Special Requests   Final    BOTTLES DRAWN AEROBIC ONLY Blood Culture adequate volume   Culture   Final    NO GROWTH 5 DAYS Performed at [facility], [place] [address], [place], [place] [address]    Report Status 06/06/2020 FINAL  Final      Radiology Studies: No results found.   Scheduled Meds: . acetaminophen  1,000 mg Oral Q6H  .  aspirin EC  81 mg Oral Daily  . atorvastatin  40 mg Oral Daily  . carvedilol  3.125 mg Oral BID WC  . Chlorhexidine Gluconate Cloth  6 each Topical Daily  . heparin injection (subcutaneous)  5,000 Units Subcutaneous Q8H  . insulin aspart  0-15 Units Subcutaneous Q4H  .  lisinopril  5 mg Oral Daily  . methocarbamol  500 mg Oral TID  . nystatin cream   Topical BID  . PARoxetine  20 mg Oral Daily  . saccharomyces boulardii  250 mg Oral BID  . sodium chloride flush  10-40 mL Intracatheter Q12H  . sodium chloride flush  3 mL Intravenous Q12H  . vancomycin  125 mg Oral Q6H   Continuous Infusions: . sodium chloride 250 mL (06/09/20 0903)  . sodium chloride 75 mL/hr at 06/08/20 0337  . lactated ringers 10 mL/hr at 06/06/20 0815  . piperacillin-tazobactam 3.375 g (06/09/20 0622)  . potassium chloride 10 mEq (06/09/20 1212)  . TPN ADULT (ION) 80 mL/hr at 06/08/20 1739  . TPN ADULT (ION)       LOS: 8 days    Time spent: 25 minutes spent in the coordination of care today.    Jonnie Finner, DO [facility]  If 7PM-7AM, please contact night-coverage [email or web address] 06/09/2020, 1:12 PM

## 2020-06-09 NOTE — Progress Notes (Signed)
Patient ID: Tonya Hughes, female   DOB: 12-16-1950, 69 y.o.   MRN: [id number] [facility] Progress Note:   3 Days Post-Op  Subjective: Mental status is alert and grumpy.  Complaints about everything. Objective: Vital signs in last 24 hours: Temp:  [97.6 F (36.4 C)-98.2 F (36.8 C)] 98 F (36.7 C) (08/08 1141) Pulse Rate:  [66-92] 92 (08/08 1141) Resp:  [16-17] 16 (08/08 1141) BP: (143-163)/(45-62) 163/62 (08/08 1141) SpO2:  [98 %-100 %] 98 % (08/08 1141) Weight:  [76.2 kg] 76.2 kg (08/08 0431)  Intake/Output from previous day: 08/07 0701 - 08/08 0700 In: 360 [P.O.:360] Out: 1550 [Urine:1550] Intake/Output this shift: Total I/O In: 183 [P.O.:180; I.V.:3] Out: 400 [Urine:400]  Physical Exam: Work of breathing is not labored.  Ostomy with green output;  VAC in place  Lab Results:  Results for orders placed or performed during the hospital encounter of 05/31/20 (from the past 48 hour(s))  Glucose, capillary     Status: Abnormal   Collection Time: 06/07/20  4:36 PM  Result Value Ref Range   Glucose-Capillary 194 (H) 70 - 99 mg/dL    Comment: Glucose reference range applies only to samples taken after fasting for at least 8 hours.  Glucose, capillary     Status: Abnormal   Collection Time: 06/07/20  8:12 PM  Result Value Ref Range   Glucose-Capillary 182 (H) 70 - 99 mg/dL    Comment: Glucose reference range applies only to samples taken after fasting for at least 8 hours.  Glucose, capillary     Status: Abnormal   Collection Time: 06/08/20 12:44 AM  Result Value Ref Range   Glucose-Capillary 175 (H) 70 - 99 mg/dL    Comment: Glucose reference range applies only to samples taken after fasting for at least 8 hours.  Glucose, capillary     Status: Abnormal   Collection Time: 06/08/20  4:06 AM  Result Value Ref Range   Glucose-Capillary 188 (H) 70 - 99 mg/dL    Comment: Glucose reference range applies only to samples taken after fasting for at least 8 hours.  Basic  metabolic panel     Status: Abnormal   Collection Time: 06/08/20  5:00 AM  Result Value Ref Range   Sodium 137 135 - 145 mmol/L   Potassium 4.2 3.5 - 5.1 mmol/L   Chloride 106 98 - 111 mmol/L   CO2 24 22 - 32 mmol/L   Glucose, Bld 245 (H) 70 - 99 mg/dL    Comment: Glucose reference range applies only to samples taken after fasting for at least 8 hours.   BUN 18 8 - 23 mg/dL   Creatinine, Ser 0.64 0.44 - 1.00 mg/dL   Calcium 8.2 (L) 8.9 - 10.3 mg/dL   GFR calc non Af Amer >60 >60 mL/min   GFR calc Af Amer >60 >60 mL/min   Anion gap 7 5 - 15    Comment: Performed at [facility] [address], [place], [place] [address]  Magnesium     Status: None   Collection Time: 06/08/20  5:00 AM  Result Value Ref Range   Magnesium 2.1 1.7 - 2.4 mg/dL    Comment: Performed at [facility], [place] [address] [place], [place] [address]  CBC with Differential/Platelet     Status: Abnormal   Collection Time: 06/08/20  5:00 AM  Result Value Ref Range   WBC 9.6 4.0 - 10.5 K/uL   RBC 2.66 (L) 3.87 -  5.11 MIL/uL   Hemoglobin 7.9 (L) 12.0 - 15.0 g/dL   HCT 26.2 (L) 36 - 46 %   MCV 98.5 80.0 - 100.0 fL   MCH 29.7 26.0 - 34.0 pg   MCHC 30.2 30.0 - 36.0 g/dL   RDW 17.2 (H) 11.5 - 15.5 %   Platelets 189 150 - 400 K/uL   nRBC 0.0 0.0 - 0.2 %   Neutrophils Relative % 67 %   Neutro Abs 6.5 1.7 - 7.7 K/uL   Lymphocytes Relative 23 %   Lymphs Abs 2.2 0.7 - 4.0 K/uL   Monocytes Relative 7 %   Monocytes Absolute 0.6 0 - 1 K/uL   Eosinophils Relative 2 %   Eosinophils Absolute 0.2 0 - 0 K/uL   Basophils Relative 0 %   Basophils Absolute 0.0 0 - 0 K/uL   Immature Granulocytes 1 %   Abs Immature Granulocytes 0.05 0.00 - 0.07 K/uL    Comment: Performed at [facility] [address] [place], [place] [address]  Glucose, capillary     Status: Abnormal   Collection Time: 06/08/20  8:25 AM  Result Value Ref Range   Glucose-Capillary 166 (H) 70 - 99 mg/dL    Comment: Glucose reference  range applies only to samples taken after fasting for at least 8 hours.  Glucose, capillary     Status: Abnormal   Collection Time: 06/08/20 11:30 AM  Result Value Ref Range   Glucose-Capillary 155 (H) 70 - 99 mg/dL    Comment: Glucose reference range applies only to samples taken after fasting for at least 8 hours.  Glucose, capillary     Status: Abnormal   Collection Time: 06/08/20  4:30 PM  Result Value Ref Range   Glucose-Capillary 160 (H) 70 - 99 mg/dL    Comment: Glucose reference range applies only to samples taken after fasting for at least 8 hours.  Glucose, capillary     Status: Abnormal   Collection Time: 06/08/20  8:39 PM  Result Value Ref Range   Glucose-Capillary 141 (H) 70 - 99 mg/dL    Comment: Glucose reference range applies only to samples taken after fasting for at least 8 hours.  Glucose, capillary     Status: Abnormal   Collection Time: 06/09/20 12:26 AM  Result Value Ref Range   Glucose-Capillary 155 (H) 70 - 99 mg/dL    Comment: Glucose reference range applies only to samples taken after fasting for at least 8 hours.  Glucose, capillary     Status: Abnormal   Collection Time: 06/09/20  4:30 AM  Result Value Ref Range   Glucose-Capillary 133 (H) 70 - 99 mg/dL    Comment: Glucose reference range applies only to samples taken after fasting for at least 8 hours.  CBC with Differential/Platelet     Status: Abnormal   Collection Time: 06/09/20  5:00 AM  Result Value Ref Range   WBC 6.9 4.0 - 10.5 K/uL   RBC 2.31 (L) 3.87 - 5.11 MIL/uL   Hemoglobin 7.1 (L) 12.0 - 15.0 g/dL   HCT 24.3 (L) 36 - 46 %   MCV 105.2 (H) 80.0 - 100.0 fL    Comment: REPEATED TO VERIFY   MCH 30.7 26.0 - 34.0 pg   MCHC 29.2 (L) 30.0 - 36.0 g/dL   RDW 17.5 (H) 11.5 - 15.5 %   Platelets 182 150 - 400 K/uL   nRBC 0.0 0.0 - 0.2 %   Neutrophils Relative % 59 %  Neutro Abs 4.1 1.7 - 7.7 K/uL   Lymphocytes Relative 27 %   Lymphs Abs 1.8 0.7 - 4.0 K/uL   Monocytes Relative 7 %   Monocytes  Absolute 0.5 0 - 1 K/uL   Eosinophils Relative 6 %   Eosinophils Absolute 0.4 0 - 0 K/uL   Basophils Relative 0 %   Basophils Absolute 0.0 0 - 0 K/uL   Immature Granulocytes 1 %   Abs Immature Granulocytes 0.04 0.00 - 0.07 K/uL    Comment: Performed at [facility] [address], [place], [place] [address]  Magnesium     Status: Abnormal   Collection Time: 06/09/20  5:00 AM  Result Value Ref Range   Magnesium 2.5 (H) 1.7 - 2.4 mg/dL    Comment: Performed at [facility] [address], [place], [place] [address]  Renal function panel     Status: Abnormal   Collection Time: 06/09/20  5:00 AM  Result Value Ref Range   Sodium 132 (L) 135 - 145 mmol/L   Potassium 5.2 (H) 3.5 - 5.1 mmol/L   Chloride 101 98 - 111 mmol/L   CO2 22 22 - 32 mmol/L   Glucose, Bld 946 (HH) 70 - 99 mg/dL    Comment: Glucose reference range applies only to samples taken after fasting for at least 8 hours. CRITICAL RESULT CALLED TO, READ BACK BY AND VERIFIED WITH: Jamal Collin RN [id number] 9[address]    BUN 14 8 - 23 mg/dL   Creatinine, Ser 0.66 0.44 - 1.00 mg/dL   Calcium 8.0 (L) 8.9 - 10.3 mg/dL   Phosphorus 5.9 (H) 2.5 - 4.6 mg/dL   Albumin 1.3 (L) 3.5 - 5.0 g/dL   GFR calc non Af Amer >60 >60 mL/min   GFR calc Af Amer >60 >60 mL/min   Anion gap 9 5 - 15    Comment: Performed at [facility] [address] [place], [place] [address]  Comprehensive metabolic panel     Status: Abnormal   Collection Time: 06/09/20  6:33 AM  Result Value Ref Range   Sodium 137 135 - 145 mmol/L   Potassium 3.6 3.5 - 5.1 mmol/L   Chloride 105 98 - 111 mmol/L   CO2 24 22 - 32 mmol/L   Glucose, Bld 151 (H) 70 - 99 mg/dL    Comment: Glucose reference range applies only to samples taken after fasting for at least 8 hours.   BUN 15 8 - 23 mg/dL   Creatinine, Ser 0.60 0.44 - 1.00 mg/dL   Calcium 7.8 (L) 8.9 - 10.3 mg/dL   Total Protein 4.9 (L) 6.5 - 8.1 g/dL   Albumin 1.4 (L) 3.5 - 5.0 g/dL   AST 16  15 - 41 U/L   ALT 11 0 - 44 U/L   Alkaline Phosphatase 41 38 - 126 U/L   Total Bilirubin 0.2 (L) 0.3 - 1.2 mg/dL   GFR calc non Af Amer >60 >60 mL/min   GFR calc Af Amer >60 >60 mL/min   Anion gap 8 5 - 15    Comment: Performed at [facility] [address], [place], [place] [address]  Magnesium     Status: None   Collection Time: 06/09/20  6:33 AM  Result Value Ref Range   Magnesium 2.0 1.7 - 2.4 mg/dL    Comment: Performed at [facility] [address], [place],  [address]  Phosphorus  Status: None   Collection Time: 06/09/20  6:33 AM  Result Value Ref Range   Phosphorus 3.5 2.5 - 4.6 mg/dL    Comment: Performed at [facility], [place] [address], [place], [place] [address]  Glucose, capillary     Status: Abnormal   Collection Time: 06/09/20  7:53 AM  Result Value Ref Range   Glucose-Capillary 153 (H) 70 - 99 mg/dL    Comment: Glucose reference range applies only to samples taken after fasting for at least 8 hours.  Glucose, capillary     Status: Abnormal   Collection Time: 06/09/20 11:36 AM  Result Value Ref Range   Glucose-Capillary 131 (H) 70 - 99 mg/dL    Comment: Glucose reference range applies only to samples taken after fasting for at least 8 hours.    Radiology/Results: No results found.  Anti-infectives: Anti-infectives (From admission, onward)   Start     Dose/Rate Route Frequency Ordered Stop   06/07/20 1200  vancomycin (VANCOCIN) 50 mg/mL oral solution 125 mg     Discontinue     125 mg Oral Every 6 hours 06/07/20 1036     06/06/20 1615  metroNIDAZOLE (FLAGYL) IVPB 500 mg  Status:  Discontinued        500 mg 100 mL/hr over 60 Minutes Intravenous Every 8 hours 06/06/20 1605 06/07/20 1036   06/06/20 1400  piperacillin-tazobactam (ZOSYN) IVPB 3.375 g     Discontinue     3.375 g 100 mL/hr over 30 Minutes Intravenous Every 8 hours 06/06/20 1324 06/11/20 1359   06/06/20 0945  polymyxin B 500,000 Units, bacitracin 50,000 Units in sodium  chloride 0.9 % 500 mL irrigation  Status:  Discontinued          As needed 06/06/20 0945 06/06/20 1242   06/06/20 0800  cefoTEtan (CEFOTAN) 2 g in sodium chloride 0.9 % 100 mL IVPB        2 g 200 mL/hr over 30 Minutes Intravenous On call to O.R. 06/05/20 0831 06/06/20 1030   06/02/20 1030  vancomycin (VANCOCIN) 50 mg/mL oral solution 125 mg  Status:  Discontinued        125 mg Oral 3 times daily before meals & bedtime 06/02/20 0822 06/06/20 1605   06/01/20 1130  metroNIDAZOLE (FLAGYL) tablet 500 mg  Status:  Discontinued        500 mg Oral 3 times daily 06/01/20 1124 06/02/20 0814      Assessment/Plan: Problem List: Patient Active Problem List   Diagnosis Date Noted  . Malnutrition of moderate degree 06/05/2020  . Hypokalemia 06/01/2020  . Abdominal pain 05/17/2020  . Hyponatremia 05/17/2020  . Diarrhea 05/17/2020  . C. difficile colitis 05/17/2020  . Hemorrhagic shock ([place])   . Peripheral arterial occlusive disease ([place]) 04/18/2020  . Shock ([place]) 04/18/2020  . Hypertension   . Hypercholesteremia   . GERD (gastroesophageal reflux disease)   . Depressive disorder   . Congestive heart failure (CHF) ([place])   . Back pain   . Acute deep vein thrombosis (DVT) of proximal vein of right lower extremity ([place]) 08/27/2017  . Closed fracture of lumbar vertebra ([place]) 05/31/2017  . S/P lumbar fusion 04/21/2017  . Lumbar radiculopathy 02/25/2017    Taking clear liquid diet; Fussy about everything.  Refusing some nursing and PT interventions.  On clears PO and TNA.   3 Days Post-Op    LOS: 8 days   Matt B. Hassell Done, MD, [facility] [phone] to reach the  surgeon on call.    06/09/2020 12:00 PM

## 2020-06-10 LAB — CBC WITH DIFFERENTIAL/PLATELET
Abs Immature Granulocytes: 0.07 10*3/uL (ref 0.00–0.07)
Basophils Absolute: 0 10*3/uL (ref 0.0–0.1)
Basophils Relative: 1 %
Eosinophils Absolute: 0.5 10*3/uL (ref 0.0–0.5)
Eosinophils Relative: 7 %
HCT: 25.2 % — ABNORMAL LOW (ref 36.0–46.0)
Hemoglobin: 7.5 g/dL — ABNORMAL LOW (ref 12.0–15.0)
Immature Granulocytes: 1 %
Lymphocytes Relative: 29 %
Lymphs Abs: 2 10*3/uL (ref 0.7–4.0)
MCH: 29.6 pg (ref 26.0–34.0)
MCHC: 29.8 g/dL — ABNORMAL LOW (ref 30.0–36.0)
MCV: 99.6 fL (ref 80.0–100.0)
Monocytes Absolute: 0.5 10*3/uL (ref 0.1–1.0)
Monocytes Relative: 7 %
Neutro Abs: 3.9 10*3/uL (ref 1.7–7.7)
Neutrophils Relative %: 55 %
Platelets: 212 10*3/uL (ref 150–400)
RBC: 2.53 MIL/uL — ABNORMAL LOW (ref 3.87–5.11)
RDW: 16.9 % — ABNORMAL HIGH (ref 11.5–15.5)
WBC: 7 10*3/uL (ref 4.0–10.5)
nRBC: 0 % (ref 0.0–0.2)

## 2020-06-10 LAB — RENAL FUNCTION PANEL
Albumin: 1.4 g/dL — ABNORMAL LOW (ref 3.5–5.0)
Anion gap: 6 (ref 5–15)
BUN: 14 mg/dL (ref 8–23)
CO2: 25 mmol/L (ref 22–32)
Calcium: 8 mg/dL — ABNORMAL LOW (ref 8.9–10.3)
Chloride: 104 mmol/L (ref 98–111)
Creatinine, Ser: 0.55 mg/dL (ref 0.44–1.00)
GFR calc Af Amer: >60 mL/min (ref >60)
GFR calc non Af Amer: >60 mL/min (ref >60)
Glucose, Bld: 138 mg/dL — ABNORMAL HIGH (ref 70–99)
Phosphorus: 3.7 mg/dL (ref 2.5–4.6)
Potassium: 4 mmol/L (ref 3.5–5.1)
Sodium: 135 mmol/L (ref 135–145)

## 2020-06-10 LAB — GLUCOSE, CAPILLARY
Glucose-Capillary: 134 mg/dL — ABNORMAL HIGH (ref 70–99)
Glucose-Capillary: 139 mg/dL — ABNORMAL HIGH (ref 70–99)
Glucose-Capillary: 140 mg/dL — ABNORMAL HIGH (ref 70–99)
Glucose-Capillary: 142 mg/dL — ABNORMAL HIGH (ref 70–99)
Glucose-Capillary: 150 mg/dL — ABNORMAL HIGH (ref 70–99)
Glucose-Capillary: 152 mg/dL — ABNORMAL HIGH (ref 70–99)

## 2020-06-10 LAB — TRIGLYCERIDES: Triglycerides: 144 mg/dL (ref <150)

## 2020-06-10 LAB — MAGNESIUM: Magnesium: 2.1 mg/dL (ref 1.7–2.4)

## 2020-06-10 LAB — SURGICAL PATHOLOGY

## 2020-06-10 LAB — PREALBUMIN: Prealbumin: 11.3 mg/dL — ABNORMAL LOW (ref 18–38)

## 2020-06-10 MED ORDER — KETOROLAC TROMETHAMINE 15 MG/ML IJ SOLN: 15.0000 mg | Freq: Four times a day (QID) | INTRAMUSCULAR | Status: DC

## 2020-06-10 MED ORDER — TRAVASOL 10 % IV SOLN
INTRAVENOUS | Status: AC
  Filled 2020-06-10: qty 960

## 2020-06-10 MED ORDER — HYDRALAZINE HCL 20 MG/ML IJ SOLN
10.0000 mg | Freq: Four times a day (QID) | INTRAMUSCULAR | Status: DC | PRN
  Administered 2020-06-11 – 2020-06-12 (×2): 10 mg via INTRAVENOUS
  Filled 2020-06-10 (×3): qty 1

## 2020-06-10 MED ORDER — KETOROLAC TROMETHAMINE 15 MG/ML IJ SOLN
15.0000 mg | Freq: Once | INTRAMUSCULAR | Status: AC
  Administered 2020-06-10: 15 mg via INTRAVENOUS
  Filled 2020-06-10: qty 1

## 2020-06-10 MED ORDER — LISINOPRIL 20 MG PO TABS
20.0000 mg | ORAL_TABLET | Freq: Every day | ORAL | Status: DC
  Administered 2020-06-11 – 2020-06-14 (×4): 20 mg via ORAL
  Filled 2020-06-10 (×4): qty 1

## 2020-06-10 MED ORDER — LISINOPRIL 10 MG PO TABS
10.0000 mg | ORAL_TABLET | Freq: Once | ORAL | Status: AC
  Administered 2020-06-10: 10 mg via ORAL
  Filled 2020-06-10: qty 1

## 2020-06-10 MED ORDER — MORPHINE SULFATE (PF) 2 MG/ML IV SOLN
2.0000 mg | INTRAVENOUS | Status: DC | PRN
  Administered 2020-06-11 – 2020-06-12 (×4): 2 mg via INTRAVENOUS
  Filled 2020-06-10 (×5): qty 1

## 2020-06-10 NOTE — Progress Notes (Signed)
PHARMACY - TOTAL PARENTERAL NUTRITION CONSULT NOTE   Indication: Small bowel obstruction and Malnutrition  Patient Measurements: Height: 5' 3"  (160 cm) Weight: 78.9 kg (173 lb 15.1 oz) IBW/kg (Calculated) : 52.4 TPN AdjBW (KG): 56.8 Body mass index is 30.81 kg/m.  Assessment: 46 YOF readmitted with abdominal pain and found to have sigmoid colonic stricture causing partial small bowel obstruction. Per surgery team, patient is taking almost no full liquids and only drinking juice. A soft diet has been ordered but surgery thinks that based on patient's history, she will not be able to tolerate solid foods. Pharmacy consulted to start TPN for nutritional support.  Glucose / Insulin: A1c 6.9, Patient received one dose of dexamethasone 5 mg on 8/5. CBGs improved to 130-150 on mSSI, 10 units insulin in last 24 hours  Electrolytes: K 4, Mg 2.1 CoCa 10.1, other lytes wnl, Home oral potassium supplements d/c'ed  Renal: SCr normalized   LFTs / TGs: LFTs wnl, Tbili wnl  Prealbumin / albumin: Pre-albumin 11.3 (8/9) << 10.2 (8/4), alb down to 1.4 Intake / Output; MIVF: NS@ 75 ml/hr, UOP 0.7 mL/kg/hr, stool output: 375 cc via colostomy, pt endorses appetite GI Imaging: Surgeries / Procedures:  8/5: Extended left colectomy + sigmoid colectomy and placement of colostomy  Central access: PICC 8/3>> TPN start date: 8/3   Nutritional Goals (Per RD recommendations on 8/3): kCal: 1900-2100, Protein: 90-105g, Fluid:  Goal TPN rate is 80 mL/hr (provides 96 g of protein and 2066 kcals per day)  Current Nutrition:  Clear liquids started - advanced to full liquids 8/9 TPN   Plan:  Continue TPN at goal rate of 89m/hr at 1800 Electrolytes in TPN: increase to 856m/L of Na, cont with 3054mL of K, reduce to 0 mEq/L of Ca, cont with 37m70m of Mg, and 15mm46m of Phos. Cont Cl:Ac ratio of 1:1 If patient tolerates full liquids - hopeful to start weaning on 8/10 Add standard MVI and trace elements to  TPN Continue moderate SSI to q4h and adjust as needed  Monitor TPN labs on Mon/Thurs   Thank you for allowing pharmacy to be a part of this patient's care.  ElizaAlycia RossettirmD, BCPS Clinical Pharmacist Clinical phone for 06/10/2020: [phone] 9:35 AM   **Pharmacist phone directory can now be found on [email or web address]).  Listed under [facility] [place]

## 2020-06-10 NOTE — Consult Note (Signed)
WOC Nurse Consult Note: Surgical team PA assessed wound appearance during dressing change.  Pt was medicated for pain prior to the procedure and tolerated with mod amt discomfort.  Reason for Consult: Midline abdominal full thickness post-op wound  Measurement: 15 cm x 5cm x 4 cm  Wound bed: red and moist, small amt bloody drainage, no odor Dressing procedure/placement/frequency: Applied one piece black foam to 138m cont suction.  WFox Crossingteam will plan to change Wed.   WAlamoNurse ostomy consult note Stoma type/location:  LUQ colostomy pouch in place with good seal, mod amt tan brown liquid stool.  No extra pouching supplies available.  Will plan to change Wed. Supplies ordered to room for staff nurses use.  Enrolled patient in HHarbineprogram: Not Yet DJulien GirtMSN, RHoncut CGerome Sam CHitchcock

## 2020-06-10 NOTE — Anesthesia Postprocedure Evaluation (Signed)
Anesthesia Post Note  Patient: Tonya Hughes  Procedure(s) Performed: EXTENDED LEFT COLECTOMY WITH SIGMOID COLECTOMY COLOSTOMY CREATION/HARTMANN PROCEDURE (N/A Abdomen) EXPLORATORY LAPAROTOMY WITH MOBILIZATION OF SPLENIC FLEXURE (N/A Abdomen) LYSIS OF ADHESION (N/A Abdomen) APPLICATION OF WOUND VAC (N/A Abdomen)     Patient location during evaluation: PACU Anesthesia Type: General Level of consciousness: awake and alert Pain management: pain level controlled Vital Signs Assessment: post-procedure vital signs reviewed and stable Respiratory status: spontaneous breathing, nonlabored ventilation, respiratory function stable and patient connected to nasal cannula oxygen Cardiovascular status: blood pressure returned to baseline and stable Postop Assessment: no apparent nausea or vomiting Anesthetic complications: no   No complications documented.           Effie Berkshire

## 2020-06-10 NOTE — Progress Notes (Signed)
[facility] Progress Note  4 Days Post-Op  Subjective: CC-  Complaining of abdominal pain. States that pain medication does not help. She did not get OOB over the weekend due to pain. Denies n/v. Colostomy functioning. Tolerating clear liquids. States that she is starving. WBC 7, afebrile  Objective: Vital signs in last 24 hours: Temp:  [98 F (36.7 C)-98.6 F (37 C)] 98.6 F (37 C) (08/09 0841) Pulse Rate:  [68-92] 77 (08/09 0841) Resp:  [15-18] 18 (08/09 0841) BP: (137-180)/(54-65) 180/56 (08/09 0841) SpO2:  [96 %-100 %] 98 % (08/09 0841) Weight:  [78.9 kg] 78.9 kg (08/09 0423) Last BM Date: 06/09/20  Intake/Output from previous day: 08/08 0701 - 08/09 0700 In: 5494.7 [P.O.:930; I.V.:4414.7; IV Piggyback:150] Out: 2633 [Urine:2050; Stool:375] Intake/Output this shift: No intake/output data recorded.  PE: Gen:  Alert, NAD Pulm:  rate and effort normal Abd: Soft, ND, appropriately tender, +BS, open midline incision beefy red without erythema or drainage, ostomy viable with soft stool in pouch     Lab Results:  Recent Labs    06/09/20 0500 06/10/20 0447  WBC 6.9 7.0  HGB 7.1* 7.5*  HCT 24.3* 25.2*  PLT 182 212   BMET Recent Labs    06/09/20 0633 06/10/20 0447  NA 137 135  K 3.6 4.0  CL 105 104  CO2 24 25  GLUCOSE 151* 138*  BUN 15 14  CREATININE 0.60 0.55  CALCIUM 7.8* 8.0*   PT/INR No results for input(s): LABPROT, INR in the last 72 hours. CMP     Component Value Date/Time   NA 135 06/10/2020 0447   K 4.0 06/10/2020 0447   CL 104 06/10/2020 0447   CO2 25 06/10/2020 0447   GLUCOSE 138 (H) 06/10/2020 0447   BUN 14 06/10/2020 0447   CREATININE 0.55 06/10/2020 0447   CALCIUM 8.0 (L) 06/10/2020 0447   PROT 4.9 (L) 06/09/2020 0633   ALBUMIN 1.4 (L) 06/10/2020 0447   AST 16 06/09/2020 0633   ALT 11 06/09/2020 0633   ALKPHOS 41 06/09/2020 0633   BILITOT 0.2 (L) 06/09/2020 0633   GFRNONAA >60 06/10/2020 0447   GFRAA >60 06/10/2020  0447   Lipase     Component Value Date/Time   LIPASE 31 05/31/2020 2038       Studies/Results: No results found.  Anti-infectives: Anti-infectives (From admission, onward)   Start     Dose/Rate Route Frequency Ordered Stop   06/07/20 1200  vancomycin (VANCOCIN) 50 mg/mL oral solution 125 mg     Discontinue     125 mg Oral Every 6 hours 06/07/20 1036     06/06/20 1615  metroNIDAZOLE (FLAGYL) IVPB 500 mg  Status:  Discontinued        500 mg 100 mL/hr over 60 Minutes Intravenous Every 8 hours 06/06/20 1605 06/07/20 1036   06/06/20 1400  piperacillin-tazobactam (ZOSYN) IVPB 3.375 g     Discontinue     3.375 g 100 mL/hr over 30 Minutes Intravenous Every 8 hours 06/06/20 1324 06/11/20 1359   06/06/20 0945  polymyxin B 500,000 Units, bacitracin 50,000 Units in sodium chloride 0.9 % 500 mL irrigation  Status:  Discontinued          As needed 06/06/20 0945 06/06/20 1242   06/06/20 0800  cefoTEtan (CEFOTAN) 2 g in sodium chloride 0.9 % 100 mL IVPB        2 g 200 mL/hr over 30 Minutes Intravenous On call to O.R. 06/05/20 0831 06/06/20 1030  06/02/20 1030  vancomycin (VANCOCIN) 50 mg/mL oral solution 125 mg  Status:  Discontinued        125 mg Oral 3 times daily before meals & bedtime 06/02/20 0822 06/06/20 1605   06/01/20 1130  metroNIDAZOLE (FLAGYL) tablet 500 mg  Status:  Discontinued        500 mg Oral 3 times daily 06/01/20 1124 06/02/20 7654       Assessment/Plan PAD chronic diastolic heart failure CKD stage II HTN T2DM HLD Chronic back pain Depression Hx aortobifem bypass on 04/18/2020 by Dr. Donzetta Matters  C diff colitis - restarted on oral vancomycin, TRH discussing length of therapy with ID Malnutrition - prealbumin 11.3 (8/9)  Descending/sigmoid colonic stricture due to ischemic colitis. Contained perforation of the rectosigmoid junction -S/p Exploratory laparotomy, LOA, Extended left colectomy with end colostomy (Hartmann's procedure) by Dr. Redmond Pulling, 06/06/2020 - POD #4 -  surgical path pending - Continue IV Zosyn for 5 days post-op. Discussed with dr. Tommy Medal and Pharmacy post op for recommendations  - Wound vac, change M/W/F - tolerating clears and ostomy functioning  FEN -TPN, FLD ADAT VTE - SCds, subq heparin ID - C. Diff tx per TRH. IV Zosyn 8/5 >> Foley - out Follow up - Dr. Redmond Pulling  Plan: Advance to full liquids, and advance to soft diet as tolerated. Continue TPN for now and will wean once tolerating enough PO. Mobilize, continue therapies. Will add IV toradol for 2 days for better pain control.   LOS: 9 days    [facility] 06/10/2020, 9:01 AM Please see Amion for pager number during day hours 7:00am-4:30pm

## 2020-06-10 NOTE — TOC Progression Note (Signed)
Transition of Care [facility]) - Progression Note    Patient Details  Name: Tonya Hughes MRN: [id number] Date of Birth: 1951-02-22  Transition of Care [facility]) CM/SW  [place], [place] Phone Number: 06/10/2020, 2:07 PM  Clinical Narrative:     CSW spoke with patients spouse Tonya Hughes by phone. CSW provided SNF bed offers to patient. Patients spouse Tonya Hughes wants to review medicare compare site to help make SNF choice. Patients spouse Tonya Hughes will call CSW tomorrow with SNF choice. Insurance authorization still pending.  Pending SNF choice. Pending insurance authorization.  CSW will continue to follow.   Expected Discharge Plan: [place] Barriers to Discharge: Continued Medical Work up  Expected Discharge Plan and Services Expected Discharge Plan: [place] arrangements for the past 2 months: Single Family Home                                       Social Determinants of Health (SDOH) Interventions    Readmission Risk Interventions Readmission Risk Prevention Plan 06/07/2020  Social Work Consult for [place] Planning/Counseling Complete  Some recent data might be hidden

## 2020-06-10 NOTE — Progress Notes (Addendum)
PROGRESS NOTE    Tonya Hughes  GGY:[id number] DOB: [birth year] DOA: 05/31/2020 PCP: Monico Blitz, MD   Brief Narrative:   Tonya Hughes an 69 y.o.femalepast medical history of chronic diastolic heart failure, diabetes mellitus DVT essential hypertension status post aortobifemoral bypass on 04/18/2020 recently discharged from the hospital for C. difficile colitis on 05/30/2020 who presents to the hospital with ongoing abdominal pain. During her recent admission she was transfused 2 units of packed red blood cells due to rectal bleeding GI was consulted who perform a flex sig on 05/20/2020 that showed colonic stricture with pathology consistent with ischemic colitis. During this time GI recommended surgical consult which the patient refused at that time. Since discharge she is of been having worsening abdominal pain and diarrhea,she isnowamenable to surgery.  8/9: Surgery is advancing diet to FLD. Appreciate assistance. Zosyn through tomorrow. Right now, PO vanc through Wednesday. Will talk with ID about if there is a need to extend PO vanc given concurrent zosyn use. She is otherwise her normal self. Working on discharge options. Lisinopril increased to 70m. Follow BP.  Assessment & Plan: Readmitted for abdominal pain due to sigmoid colonic stricture causing partial small bowel obstruction - Abdominal x-ray done on 06/01/2020 showed partial small bowel obstruction.  - Barium enema demonstrated a long smooth stricture from the descending colon to the rectum on her previous admission. - she is on TPN - s/p left colectomy w/ colostomy; appreciate surgery assistance - 8/9: Surgery is advancing diet to FLD. Appreciate assistance. Zosyn through tomorrow. Right now, PO vanc through Wednesday. Will talk with ID about if there is a need to extend PO vanc given concurrent zosyn use.   History of of C. Dif. colitis - She tested positive for C. difficile on 05/17/2020, still  having some diarrhea, Flagyl is not optimal for treatment of C. difficile colitis.  - She was being noncompliant with her Flagyl as an outpatient. - Started on oral vancomycin and will continue total treatment of 10 days. - diarrhea is improved, follow, maintain precautions -8/9: Right now, PO vanc through Wednesday. Spoke with ID. They recommend that we congtinue PO vanc w/ a new 10-day start time after zosyn is complete. So she will continue vanc through 8/20.  Vaginal candidiasis - nystatin  Hypovolemic hyponatremia: - resolved on fluids  Hypokalemia Hypomagnesemia -K+/Mg2+ replaced. Follow  AKI - resolved, follow  Hx of PVD status post aortobifemoral bypass: - aspirin and statins.  Essential hypertension Chronic diastolic heart failure - lisinopril, coreg     - lisinopril was increased to 180m follow, may need to increase more - follow I&O, daily wts  DMt2 - A1c of 6.9. - she is on TPN, FLD     - SSI; follow glucose  Left heel unstageable sacral decubitus ulcer present on admission: - RN Pressure Injury Documentation: - Pressure Injury 04/27/20 Heel Left Deep Tissue Pressure Injury - Purple or maroon localized area of discolored intact skin or blood-filled blister due to damage of underlying soft tissue from pressure and/or shear. (Active) 04/27/20 2110 Location: Heel Location Orientation: Left Staging: Deep Tissue Pressure Injury - Purple or maroon localized area of discolored intact skin or blood-filled blister due to damage of underlying soft tissue from pressure and/or shear. Wound Description (Comments):  Present on Admission:  DVT prophylaxis: heparin Code Status: DNR Family Communication: None at bedside.   Status is: Inpatient  Remains inpatient appropriate because:Inpatient level of care appropriate due to severity of illness   Dispo: The  patient is from: Home               Anticipated d/c is to: SNF              Anticipated d/c date is: 3 days              Patient currently is not medically stable to d/c.  Consultants:   General Surgery  Procedures:   Left colectomy, colostomy placement  Antimicrobials:  . Vancomycin, zosyn   ROS:  Reports ab pain, hunger, N. Denies CP. Remainder ROS is negative for all not previously mentioned.  Subjective: "I can't eat this stuff!"  Objective: Vitals:   06/09/20 1615 06/09/20 1724 06/09/20 2136 06/10/20 0423  BP: (!) 149/56 (!) 137/54  (!) 178/65  Pulse: 68 73  74  Resp:  15 16 15   Temp:  98.5 F (36.9 C) 98.1 F (36.7 C) 98 F (36.7 C)  TempSrc:  Oral Oral Oral  SpO2:  100% 96% 97%  Weight:    78.9 kg  Height:        Intake/Output Summary (Last 24 hours) at 06/10/2020 0718 Last data filed at 06/10/2020 0500 Gross per 24 hour  Intake 5494.72 ml  Output 1625 ml  Net 3869.72 ml   Filed Weights   06/07/20 0430 06/09/20 0431 06/10/20 0423  Weight: 71.4 kg 76.2 kg 78.9 kg    Examination:  General: 69 y.o. female resting in bed in NAD Cardiovascular: RRR, +S1, S2, no m/g/r, equal pulses throughout Respiratory: CTABL, no w/r/r, normal WOB GI: BS+,central TTP along incision area, ostomy noted; incision area w/o drainage MSK: No e/c/c Neuro: Alert to name, follows commands   Data Reviewed: I have personally reviewed following labs and imaging studies.  CBC: Recent Labs  Lab 06/05/20 0410 06/05/20 0410 06/06/20 0456 06/07/20 0500 06/08/20 0500 06/09/20 0500 06/10/20 0447  WBC 5.9   < > 5.5 9.2 9.6 6.9 7.0  NEUTROABS 3.2  --  2.6  --  6.5 4.1 3.9  HGB 9.6*   < > 10.1* 9.4* 7.9* 7.1* 7.5*  HCT 30.1*   < > 32.3* 30.8* 26.2* 24.3* 25.2*  MCV 95.3   < > 96.7 96.9 98.5 105.2* 99.6  PLT 245   < > 230 217 189 182 212   < > = values in this interval not displayed.   Basic Metabolic Panel: Recent Labs  Lab 06/06/20 0456 06/06/20 0456 06/07/20 0500 06/08/20 0500 06/09/20 0500  06/09/20 0633 06/10/20 0447  NA 137   < > 135 137 132* 137 135  K 5.1   < > 4.2 4.2 5.2* 3.6 4.0  CL 101   < > 105 106 101 105 104  CO2 29   < > 24 24 22 24 25   GLUCOSE 152*   < > 232* 245* 946* 151* 138*  BUN 9   < > 20 18 14 15 14   CREATININE 0.67   < > 0.66 0.64 0.66 0.60 0.55  CALCIUM 8.4*   < > 8.0* 8.2* 8.0* 7.8* 8.0*  MG 2.2   < > 1.8 2.1 2.5* 2.0 2.1  PHOS 4.1  --  3.4  --  5.9* 3.5 3.7   < > = values in this interval not displayed.   GFR: Estimated Creatinine Clearance: 67.9 mL/min (by C-G formula based on SCr of 0.55 mg/dL). Liver Function Tests: Recent Labs  Lab 06/05/20 0410 06/06/20 0456 06/09/20 0500 06/09/20 0633 06/10/20 0447  AST 13* 14*  --  16  --   ALT 8 9  --  11  --   ALKPHOS 39 40  --  41  --   BILITOT 0.4 0.6  --  0.2*  --   PROT 5.5* 6.0*  --  4.9*  --   ALBUMIN 2.0* 2.1* 1.3* 1.4* 1.4*   No results for input(s): LIPASE, AMYLASE in the last 168 hours. No results for input(s): AMMONIA in the last 168 hours. Coagulation Profile: No results for input(s): INR, PROTIME in the last 168 hours. Cardiac Enzymes: No results for input(s): CKTOTAL, CKMB, CKMBINDEX, TROPONINI in the last 168 hours. BNP (last 3 results) No results for input(s): PROBNP in the last 8760 hours. HbA1C: No results for input(s): HGBA1C in the last 72 hours. CBG: Recent Labs  Lab 06/09/20 1136 06/09/20 1723 06/09/20 2058 06/10/20 0032 06/10/20 0408  GLUCAP 131* 143* 146* 142* 139*   Lipid Profile: Recent Labs    06/10/20 0447  TRIG 144   Thyroid Function Tests: No results for input(s): TSH, T4TOTAL, FREET4, T3FREE, THYROIDAB in the last 72 hours. Anemia Panel: No results for input(s): VITAMINB12, FOLATE, FERRITIN, TIBC, IRON, RETICCTPCT in the last 72 hours. Sepsis Labs: No results for input(s): PROCALCITON, LATICACIDVEN in the last 168 hours.  Recent Results (from the past 240 hour(s))  SARS Coronavirus 2 by RT PCR (hospital order, performed in [facility]  hospital lab) Nasopharyngeal Nasopharyngeal Swab     Status: None   Collection Time: 06/01/20 12:17 PM   Specimen: Nasopharyngeal Swab  Result Value Ref Range Status   SARS Coronavirus 2 NEGATIVE NEGATIVE Final    Comment: (NOTE) SARS-CoV-2 target nucleic acids are NOT DETECTED.  The SARS-CoV-2 RNA is generally detectable in upper and lower respiratory specimens during the acute phase of infection. The lowest concentration of SARS-CoV-2 viral copies this assay can detect is 250 copies / mL. A negative result does not preclude SARS-CoV-2 infection and should not be used as the sole basis for treatment or other patient management decisions.  A negative result may occur with improper specimen collection / handling, submission of specimen other than nasopharyngeal swab, presence of viral mutation(s) within the areas targeted by this assay, and inadequate number of viral copies (<250 copies / mL). A negative result must be combined with clinical observations, patient history, and epidemiological information.  Fact Sheet for Patients:   StrictlyIdeas.no  Fact Sheet for Healthcare Providers: BankingDealers.co.za  This test is not yet approved or  cleared by the [place] FDA and has been authorized for detection and/or diagnosis of SARS-CoV-2 by FDA under an Emergency Use Authorization (EUA).  This EUA will remain in effect (meaning this test can be used) for the duration of the COVID-19 declaration under Section 564(b)(1) of the Act, 21 U.S.C. section 360bbb-3(b)(1), unless the authorization is terminated or revoked sooner.  Performed at [facility], [place] [address] [place], [place] [address]   Culture, blood (routine x 2)     Status: None   Collection Time: 06/01/20 12:51 PM   Specimen: BLOOD  Result Value Ref Range Status   Specimen Description BLOOD RIGHT ANTECUBITAL  Final   Special Requests   Final    BOTTLES DRAWN  AEROBIC AND ANAEROBIC Blood Culture adequate volume   Culture   Final    NO GROWTH 5 DAYS Performed at [facility], [place] [address] [place], [place] [address]    Report Status 06/06/2020 FINAL  Final  Culture, blood (routine x 2)  Status: None   Collection Time: 06/01/20  4:37 PM   Specimen: BLOOD  Result Value Ref Range Status   Specimen Description BLOOD RIGHT ANTECUBITAL  Final   Special Requests   Final    BOTTLES DRAWN AEROBIC ONLY Blood Culture adequate volume   Culture   Final    NO GROWTH 5 DAYS Performed at [facility], [address] [place], [place] [address]    Report Status 06/06/2020 FINAL  Final      Radiology Studies: No results found.   Scheduled Meds: . acetaminophen  1,000 mg Oral Q6H  . aspirin EC  81 mg Oral Daily  . atorvastatin  40 mg Oral Daily  . carvedilol  3.125 mg Oral BID WC  . Chlorhexidine Gluconate Cloth  6 each Topical Daily  . heparin injection (subcutaneous)  5,000 Units Subcutaneous Q8H  . insulin aspart  0-15 Units Subcutaneous Q4H  . lisinopril  10 mg Oral Daily  . methocarbamol  500 mg Oral TID  . nystatin cream   Topical BID  . PARoxetine  20 mg Oral Daily  . saccharomyces boulardii  250 mg Oral BID  . sodium chloride flush  10-40 mL Intracatheter Q12H  . sodium chloride flush  3 mL Intravenous Q12H  . vancomycin  125 mg Oral Q6H   Continuous Infusions: . sodium chloride 250 mL (06/09/20 0903)  . sodium chloride 75 mL/hr at 06/08/20 0337  . lactated ringers 10 mL/hr at 06/06/20 0815  . piperacillin-tazobactam 3.375 g (06/10/20 0617)  . TPN ADULT (ION) 80 mL/hr at 06/09/20 1757     LOS: 9 days    Time spent: 25 minutes spent in the coordination of care today.    Jonnie Finner, DO [facility]  If 7PM-7AM, please contact night-coverage [email or web address] 06/10/2020, 7:18 AM

## 2020-06-10 NOTE — Progress Notes (Signed)
  Progress Note    06/10/2020 1:49 PM 4 Days Post-Op  Subjective: Still having abdominal pain but tolerating p.o.  Vitals:   06/10/20 0841 06/10/20 1241  BP: (!) 180/56 (!) 190/66  Pulse: 77 73  Resp: 18 16  Temp: 98.6 F (37 C) 97.6 F (36.4 C)  SpO2: 98% 96%    Physical Exam: Awake alert oriented Abdomen with wound VAC in place Bilateral groins clean dry intact nearly healed Palpable dorsalis pedis pulses bilaterally  CBC    Component Value Date/Time   WBC 7.0 06/10/2020 0447   RBC 2.53 (L) 06/10/2020 0447   HGB 7.5 (L) 06/10/2020 0447   HCT 25.2 (L) 06/10/2020 0447   PLT 212 06/10/2020 0447   MCV 99.6 06/10/2020 0447   MCH 29.6 06/10/2020 0447   MCHC 29.8 (L) 06/10/2020 0447   RDW 16.9 (H) 06/10/2020 0447   LYMPHSABS 2.0 06/10/2020 0447   MONOABS 0.5 06/10/2020 0447   EOSABS 0.5 06/10/2020 0447   BASOSABS 0.0 06/10/2020 0447    BMET    Component Value Date/Time   NA 135 06/10/2020 0447   K 4.0 06/10/2020 0447   CL 104 06/10/2020 0447   CO2 25 06/10/2020 0447   GLUCOSE 138 (H) 06/10/2020 0447   BUN 14 06/10/2020 0447   CREATININE 0.55 06/10/2020 0447   CALCIUM 8.0 (L) 06/10/2020 0447   GFRNONAA >60 06/10/2020 0447   GFRAA >60 06/10/2020 0447    INR    Component Value Date/Time   INR 1.5 (H) 04/18/2020 1648     Intake/Output Summary (Last 24 hours) at 06/10/2020 1349 Last data filed at 06/10/2020 0951 Gross per 24 hour  Intake 5407.72 ml  Output 2025 ml  Net 3382.72 ml     Assessment/plan:  69 y.o. female is s/p aortobifemoral bypass that was complicated by early ischemic colitis now status post colectomy with colostomy has nearly healed her bilateral groins.  General surgery care of this patient much appreciated.  Vascular will be available as needed.   Rayhan Groleau C. Donzetta Matters, MD [facility] Office: [phone] Pager: [phone]  06/10/2020 1:49 PM

## 2020-06-11 LAB — BASIC METABOLIC PANEL
Anion gap: 7 (ref 5–15)
BUN: 13 mg/dL (ref 8–23)
CO2: 26 mmol/L (ref 22–32)
Calcium: 7.9 mg/dL — ABNORMAL LOW (ref 8.9–10.3)
Chloride: 103 mmol/L (ref 98–111)
Creatinine, Ser: 0.51 mg/dL (ref 0.44–1.00)
GFR calc Af Amer: >60 mL/min (ref >60)
GFR calc non Af Amer: >60 mL/min (ref >60)
Glucose, Bld: 139 mg/dL — ABNORMAL HIGH (ref 70–99)
Potassium: 3.8 mmol/L (ref 3.5–5.1)
Sodium: 136 mmol/L (ref 135–145)

## 2020-06-11 LAB — GLUCOSE, CAPILLARY
Glucose-Capillary: 122 mg/dL — ABNORMAL HIGH (ref 70–99)
Glucose-Capillary: 139 mg/dL — ABNORMAL HIGH (ref 70–99)
Glucose-Capillary: 140 mg/dL — ABNORMAL HIGH (ref 70–99)
Glucose-Capillary: 152 mg/dL — ABNORMAL HIGH (ref 70–99)

## 2020-06-11 MED ORDER — PNEUMOCOCCAL VAC POLYVALENT 25 MCG/0.5ML IJ INJ: 0.5000 mL | INJECTION | INTRAMUSCULAR | Status: DC | PRN

## 2020-06-11 MED ORDER — INSULIN ASPART 100 UNIT/ML ~~LOC~~ SOLN
0.0000 [IU] | Freq: Four times a day (QID) | SUBCUTANEOUS | Status: DC
  Administered 2020-06-11: 2 [IU] via SUBCUTANEOUS

## 2020-06-11 MED ORDER — TRAVASOL 10 % IV SOLN
INTRAVENOUS | Status: AC
  Filled 2020-06-11: qty 480

## 2020-06-11 NOTE — Progress Notes (Signed)
Nutrition Follow-up / Consult  DOCUMENTATION CODES:   Non-severe (moderate) malnutrition in context of chronic illness  INTERVENTION:    Try Magic cup TID with meals, each supplement provides 290 kcal and 9 grams of protein  Wean TPN per Pharmacy  NUTRITION DIAGNOSIS:   Moderate Malnutrition related to chronic illness (ischemic colitis) as evidenced by energy intake < or equal to 75% for > or equal to 1 month, percent weight loss, mild fat depletion, moderate fat depletion, mild muscle depletion, moderate muscle depletion.  Ongoing  GOAL:   Patient will meet greater than or equal to 90% of their needs  Met with TPN + PO intake.  MONITOR:   PO intake, Diet advancement, Labs, Weight trends, Skin, I & O's  REASON FOR ASSESSMENT:   Consult Assessment of nutrition requirement/status  ASSESSMENT:   This is a 69 year old female with past medical history of HFpEF (last EF 60 to 65% on June 11) diabetes, DVT, hypertension, hyperlipidemia, PAD s/p aortobifemoral bypass (04/18/2020) who presented for evaluation of ongoing worsening abdominal pain  8/5 S/P colectomy with colostomy for colonic stricture.  Currently receiving TPN at 80 ml/h, decreasing rate in half today in hopes of increasing appetite.  Patient is receiving a soft diet with thin liquids. She is consuming 0-50% of meals. Patient does not like the PO supplements she has been receiving.   Unable to reach patient by phone, but did speak with RN, who reports patient has been refusing her supplements. She seems to be eating a little more of her meals today, intake at breakfast was 50%.   TPN at 40 ml/h will provide 1032 kcal and 48 gm protein per day.  Labs reviewed.  CBG: [phone]  Medications reviewed and include novolog, florastor.  Weight up to 79.8 kg since admission, likely related to volume status.   Diet Order:   Diet Order            DIET SOFT Room service appropriate? Yes; Fluid consistency: Thin   Diet effective now                 EDUCATION NEEDS:   No education needs have been identified at this time  Skin:  Skin Assessment: Skin Integrity Issues: Skin Integrity Issues:: DTI, Incisions, Other (Comment) DTI: lt heel Incisions: rt groin, lt groin, abdomen Other: MASD rt buttocks  Last BM:  8/10 type 6  Height:   Ht Readings from Last 1 Encounters:  06/06/20 5' 3"  (1.6 m)    Weight:   Wt Readings from Last 1 Encounters:  06/11/20 79.8 kg    Ideal Body Weight:  52.3 kg  BMI:  Body mass index is 31.16 kg/m.  Estimated Nutritional Needs:   Kcal:  1900-2100  Protein:  90-105 grams  Fluid:  > 1.9 L    Lucas Mallow, RD, LDN, CNSC Please refer to Amion for contact information.

## 2020-06-11 NOTE — Progress Notes (Signed)
PROGRESS NOTE    Tonya Hughes  OFB:[id number] DOB: Sep 11, 1951 DOA: 05/31/2020 PCP: Monico Blitz, MD   Brief Narrative:   Tonya Hughes an 69 y.o.femalepast medical history of chronic diastolic heart failure, diabetes mellitus DVT essential hypertension status post aortobifemoral bypass on 04/18/2020 recently discharged from the hospital for C. difficile colitis on 05/30/2020 who presents to the hospital with ongoing abdominal pain. During her recent admission she was transfused 2 units of packed red blood cells due to rectal bleeding GI was consulted who perform a flex sig on 05/20/2020 that showed colonic stricture with pathology consistent with ischemic colitis. During this time GI recommended surgical consult which the patient refused at that time. Since discharge she is of been having worsening abdominal pain and diarrhea,she isnowamenable to surgery.  8/10: Advanced to soft diet. Still not mobilizing. Increase lisinopril. Talked with ID. Continue PO vanc for 10 days after the end of zosyn (she's be on it through 8/20)   Assessment & Plan: Readmitted for abdominal pain due to sigmoid colonic stricture causing partial small bowel obstruction - Abdominal x-ray done on 06/01/2020 showed partial small bowel obstruction.  - Barium enema demonstrated a long smooth stricture from the descending colon to the rectum on her previous admission. - she is on TPN - s/p left colectomy w/ colostomy; appreciate surgery assistance - 8/10: Surgery is advancing diet to soft. Appreciate assistance. Zosyn through today. Continue PO vanc through 8/20. She needs to mobilize, but is refusing. Working on SNF placement  History of of C. Dif. colitis - She tested positive for C. difficile on 05/17/2020, still having some diarrhea, Flagyl is not optimal for treatment of C. difficile colitis.  - She was being noncompliant with her Flagyl as an outpatient. - Started on oral  vancomycin and will continue total treatment of 10 days. - diarrhea is improved, follow, maintain precautions -Spoke with ID. They recommend that we congtinue PO vanc w/ a new 10-day start time after zosyn is complete. So she will continue vanc through 8/20.  Vaginal candidiasis - resolved  Hypovolemic hyponatremia: - resolved  Hypokalemia Hypomagnesemia -K+/Mg2+ replaced. Follow  AKI - resolved, follow  Hx of PVD status post aortobifemoral bypass: - ASA and statin.  Essential hypertension Chronic diastolic heart failure - lisinopril, coreg     - lisinopril increased to 28m, follow - follow I&O, daily wts  DMt2 - A1c of 6.9. - she is on TPN, SMD - SSI     - glucose is acceptable  Left heel unstageable sacral decubitus ulcer present on admission: - RN Pressure Injury Documentation: - Pressure Injury 04/27/20 Heel Left Deep Tissue Pressure Injury - Purple or maroon localized area of discolored intact skin or blood-filled blister due to damage of underlying soft tissue from pressure and/or shear. (Active) 04/27/20 2110 Location: Heel Location Orientation: Left Staging: Deep Tissue Pressure Injury - Purple or maroon localized area of discolored intact skin or blood-filled blister due to damage of underlying soft tissue from pressure and/or shear. Wound Description (Comments):  Present on Admission:  DVT prophylaxis: heparin Code Status: DNR Family Communication: Attempted call to [facility] Received VM only.   Status is: Inpatient  Remains inpatient appropriate because:Inpatient level of care appropriate due to severity of illness   Dispo: The patient is from: Home              Anticipated d/c is to: SNF              Anticipated d/c date  is: 3 days              Patient currently is not medically stable to d/c.  Consultants:   General Surgery  Procedures:   Left colectomy, colostomy  placement  Antimicrobials:  . Zosyn, vanc   Subjective: No acute events ON.   Objective: Vitals:   06/11/20 0547 06/11/20 0611 06/11/20 0829 06/11/20 1123  BP: (!) 176/61 (!) 162/54 (!) 167/81 (!) 143/56  Pulse:   78 78  Resp: 18  16 18   Temp: 98.5 F (36.9 C)  97.9 F (36.6 C) 98.3 F (36.8 C)  TempSrc: Oral  Oral Oral  SpO2: 96%  98% 97%  Weight:      Height:        Intake/Output Summary (Last 24 hours) at 06/11/2020 1133 Last data filed at 06/11/2020 1123 Gross per 24 hour  Intake 489 ml  Output 2795 ml  Net -2306 ml   Filed Weights   06/09/20 0431 06/10/20 0423 06/11/20 0355  Weight: 76.2 kg 78.9 kg 79.8 kg    Examination:  General: 69 y.o. female resting in bed in NAD Cardiovascular: RRR, +S1, S2, no m/g/r Respiratory: CTABL, no w/r/r, normal WOB GI: BS+, ND, soft, appropriate TTP along incision area, colostomy noted MSK: No e/c/c Neuro: Alert, follows commands Psyc: calm/cooperative  Data Reviewed: I have personally reviewed following labs and imaging studies.  CBC: Recent Labs  Lab 06/05/20 0410 06/05/20 0410 06/06/20 0456 06/07/20 0500 06/08/20 0500 06/09/20 0500 06/10/20 0447  WBC 5.9   < > 5.5 9.2 9.6 6.9 7.0  NEUTROABS 3.2  --  2.6  --  6.5 4.1 3.9  HGB 9.6*   < > 10.1* 9.4* 7.9* 7.1* 7.5*  HCT 30.1*   < > 32.3* 30.8* 26.2* 24.3* 25.2*  MCV 95.3   < > 96.7 96.9 98.5 105.2* 99.6  PLT 245   < > 230 217 189 182 212   < > = values in this interval not displayed.   Basic Metabolic Panel: Recent Labs  Lab 06/06/20 0456 06/06/20 0456 06/07/20 0500 06/07/20 0500 06/08/20 0500 06/09/20 0500 06/09/20 0633 06/10/20 0447 06/11/20 0625  NA 137   < > 135   < > 137 132* 137 135 136  K 5.1   < > 4.2   < > 4.2 5.2* 3.6 4.0 3.8  CL 101   < > 105   < > 106 101 105 104 103  CO2 29   < > 24   < > 24 22 24 25 26   GLUCOSE 152*   < > 232*   < > 245* 946* 151* 138* 139*  BUN 9   < > 20   < > 18 14 15 14 13   CREATININE 0.67   < > 0.66   < > 0.64 0.66  0.60 0.55 0.51  CALCIUM 8.4*   < > 8.0*   < > 8.2* 8.0* 7.8* 8.0* 7.9*  MG 2.2   < > 1.8  --  2.1 2.5* 2.0 2.1  --   PHOS 4.1  --  3.4  --   --  5.9* 3.5 3.7  --    < > = values in this interval not displayed.   GFR: Estimated Creatinine Clearance: 68.3 mL/min (by C-G formula based on SCr of 0.51 mg/dL). Liver Function Tests: Recent Labs  Lab 06/05/20 0410 06/06/20 0456 06/09/20 0500 06/09/20 0633 06/10/20 0447  AST 13* 14*  --  16  --  ALT 8 9  --  11  --   ALKPHOS 39 40  --  41  --   BILITOT 0.4 0.6  --  0.2*  --   PROT 5.5* 6.0*  --  4.9*  --   ALBUMIN 2.0* 2.1* 1.3* 1.4* 1.4*   No results for input(s): LIPASE, AMYLASE in the last 168 hours. No results for input(s): AMMONIA in the last 168 hours. Coagulation Profile: No results for input(s): INR, PROTIME in the last 168 hours. Cardiac Enzymes: No results for input(s): CKTOTAL, CKMB, CKMBINDEX, TROPONINI in the last 168 hours. BNP (last 3 results) No results for input(s): PROBNP in the last 8760 hours. HbA1C: No results for input(s): HGBA1C in the last 72 hours. CBG: Recent Labs  Lab 06/10/20 1239 06/10/20 1707 06/10/20 2029 06/11/20 0010 06/11/20 0827  GLUCAP 152* 134* 140* 140* 152*   Lipid Profile: Recent Labs    06/10/20 0447  TRIG 144   Thyroid Function Tests: No results for input(s): TSH, T4TOTAL, FREET4, T3FREE, THYROIDAB in the last 72 hours. Anemia Panel: No results for input(s): VITAMINB12, FOLATE, FERRITIN, TIBC, IRON, RETICCTPCT in the last 72 hours. Sepsis Labs: No results for input(s): PROCALCITON, LATICACIDVEN in the last 168 hours.  Recent Results (from the past 240 hour(s))  SARS Coronavirus 2 by RT PCR (hospital order, performed in [facility] hospital lab) Nasopharyngeal Nasopharyngeal Swab     Status: None   Collection Time: 06/01/20 12:17 PM   Specimen: Nasopharyngeal Swab  Result Value Ref Range Status   SARS Coronavirus 2 NEGATIVE NEGATIVE Final    Comment: (NOTE) SARS-CoV-2  target nucleic acids are NOT DETECTED.  The SARS-CoV-2 RNA is generally detectable in upper and lower respiratory specimens during the acute phase of infection. The lowest concentration of SARS-CoV-2 viral copies this assay can detect is 250 copies / mL. A negative result does not preclude SARS-CoV-2 infection and should not be used as the sole basis for treatment or other patient management decisions.  A negative result may occur with improper specimen collection / handling, submission of specimen other than nasopharyngeal swab, presence of viral mutation(s) within the areas targeted by this assay, and inadequate number of viral copies (<250 copies / mL). A negative result must be combined with clinical observations, patient history, and epidemiological information.  Fact Sheet for Patients:   StrictlyIdeas.no  Fact Sheet for Healthcare Providers: BankingDealers.co.za  This test is not yet approved or  cleared by the [place] FDA and has been authorized for detection and/or diagnosis of SARS-CoV-2 by FDA under an Emergency Use Authorization (EUA).  This EUA will remain in effect (meaning this test can be used) for the duration of the COVID-19 declaration under Section 564(b)(1) of the Act, 21 U.S.C. section 360bbb-3(b)(1), unless the authorization is terminated or revoked sooner.  Performed at [facility], [place] [address] [place], [place] [address]   Culture, blood (routine x 2)     Status: None   Collection Time: 06/01/20 12:51 PM   Specimen: BLOOD  Result Value Ref Range Status   Specimen Description BLOOD RIGHT ANTECUBITAL  Final   Special Requests   Final    BOTTLES DRAWN AEROBIC AND ANAEROBIC Blood Culture adequate volume   Culture   Final    NO GROWTH 5 DAYS Performed at [facility], [place] [address] [place], [place] [address]    Report Status 06/06/2020 FINAL  Final  Culture, blood (routine x 2)      Status:  None   Collection Time: 06/01/20  4:37 PM   Specimen: BLOOD  Result Value Ref Range Status   Specimen Description BLOOD RIGHT ANTECUBITAL  Final   Special Requests   Final    BOTTLES DRAWN AEROBIC ONLY Blood Culture adequate volume   Culture   Final    NO GROWTH 5 DAYS Performed at [facility], [address], [place], [place] [address]    Report Status 06/06/2020 FINAL  Final      Radiology Studies: No results found.   Scheduled Meds: . acetaminophen  1,000 mg Oral Q6H  . aspirin EC  81 mg Oral Daily  . atorvastatin  40 mg Oral Daily  . carvedilol  3.125 mg Oral BID WC  . Chlorhexidine Gluconate Cloth  6 each Topical Daily  . heparin injection (subcutaneous)  5,000 Units Subcutaneous Q8H  . insulin aspart  0-15 Units Subcutaneous Q6H  . lisinopril  20 mg Oral Daily  . methocarbamol  500 mg Oral TID  . nystatin cream   Topical BID  . PARoxetine  20 mg Oral Daily  . saccharomyces boulardii  250 mg Oral BID  . sodium chloride flush  10-40 mL Intracatheter Q12H  . sodium chloride flush  3 mL Intravenous Q12H  . vancomycin  125 mg Oral Q6H   Continuous Infusions: . sodium chloride 250 mL (06/09/20 0903)  . sodium chloride 75 mL/hr at 06/10/20 1227  . lactated ringers 10 mL/hr at 06/06/20 0815  . TPN ADULT (ION) 80 mL/hr at 06/10/20 1724  . TPN ADULT (ION)       LOS: 10 days    Time spent: 25 minutes spent in the coordination of care today.    Jonnie Finner, DO [facility]  If 7PM-7AM, please contact night-coverage [email or web address] 06/11/2020, 11:33 AM

## 2020-06-11 NOTE — Progress Notes (Signed)
PHARMACY - TOTAL PARENTERAL NUTRITION CONSULT NOTE   Indication: Small bowel obstruction and Malnutrition  Patient Measurements: Height: 5' 3"  (160 cm) Weight: 79.8 kg (175 lb 14.8 oz) IBW/kg (Calculated) : 52.4 TPN AdjBW (KG): 56.8 Body mass index is 31.16 kg/m.  Assessment: 46 YOF readmitted with abdominal pain and found to have sigmoid colonic stricture causing partial small bowel obstruction. Per surgery team, patient is taking almost no full liquids and only drinking juice. A soft diet has been ordered but surgery thinks that based on patient's history, she will not be able to tolerate solid foods. Pharmacy consulted to start TPN for nutritional support.  Glucose / Insulin: A1c 6.9, CBGs 130-150 on mSSI, 13 units insulin in last 24 hours  Electrolytes: K 3.8, Mg 2.1 CoCa 10, other lytes wnl, Home oral potassium supplements d/c'ed  Renal: SCr normalized   LFTs / TGs: LFTs wnl, Tbili wnl  Prealbumin / albumin: Pre-albumin 11.3 (8/9) << 10.2 (8/4), alb down to 1.4 Intake / Output; MIVF: NS@ 75 ml/hr, UOP 1.5 mL/kg/hr, stool output: 120 cc via colostomy, surgery requesting half rate to help with appetite stimulation on 8/10 GI Imaging: Surgeries / Procedures:  8/5: Extended left colectomy + sigmoid colectomy and placement of colostomy  Central access: PICC 8/3>> TPN start date: 8/3   Nutritional Goals (Per RD recommendations on 8/3): kCal: 1900-2100, Protein: 90-105g, Fluid:  Goal TPN rate is 80 mL/hr (provides 96 g of protein and 2066 kcals per day)  Current Nutrition:  Clear liquids started - advanced to soft diet 8/9 - 25-40% charted as given TPN   Plan:  Reduce TPN to half rate of 40 cc/hr starting at 1800 today to provide 48g protein and 1031 kcal meeting ~50% of estimated needs Electrolytes in TPN: continue with 55mq/L of Na, increase to 552m/L of K, continue with 0 mEq/L of Ca, cont with 1097mL of Mg, and 82m4mL of Phos. Cont Cl:Ac ratio of 1:1 As the patient  continues to tolerate a diet - will follow-up on plans to continue to wean TPN to off  Add standard MVI and trace elements to TPN Adjust moderate SSI to q6h and adjust as needed  Monitor TPN labs on Mon/Thurs   Thank you for allowing pharmacy to be a part of this patient's care.  ElizAlycia RossettiarmD, BCPS Clinical Pharmacist Clinical phone for 06/11/2020: [phone] 9:49 AM   **Pharmacist phone directory can now be found on [email or web address] ([email or web address]).  Listed under [facility]

## 2020-06-11 NOTE — Progress Notes (Signed)
Physical Therapy Treatment Patient Details Name: Tonya Hughes MRN: [id number] DOB: 23-Sep-1951 Today's Date: 06/11/2020    History of Present Illness Tonya Hughes is an 69 y.o. female past medical history of chronic diastolic heart failure, diabetes mellitus DVT essential hypertension status post aortobifemoral bypass on 04/18/2020 recently discharged from the hospital for C. difficile colitis on 05/30/2020 who presents to the hospital with ongoing abdominal pain.  During her recent admission she was transfused 2 units of packed red blood cells due to rectal bleeding GI was consulted who perform a flex sig on 05/20/2020 that showed colonic stricture with pathology consistent with ischemic colitis. Colostomy placed 8/5 with wound vac    PT Comments    Pt reports increased abdominal pain with movement, but is willing to try therapy today.  Pt limited in safe mobility by decreased awareness of need for mobility, in presence of pain, and decreasing strength. Pt min Ax2 for rolling and modAx2 to come to seated EoB. Pt with increased posterior lean to decrease trunk flexion. Attempted to come to standing with maxAx2, when instructed to place L LE on floor pt yelled "That foot has not been on the floor since my bypass." and sat back down. Pt refused further attempts at standing and laid back on bed pt requires maxAx2 to return to bed. D/c plans remain appropriate at this time. PT will continue to follow acutely.    Follow Up Recommendations  SNF     Equipment Recommendations  Hospital bed       Precautions / Restrictions Precautions Precautions: Fall;Other (comment) Precaution Comments: abdominal precautions, wound vac and ostomy Restrictions Weight Bearing Restrictions: No    Mobility  Bed Mobility Overal bed mobility: Needs Assistance Bed Mobility: Rolling Rolling: +2 for physical assistance;Min assist Sidelying to sit: Mod assist;+2 for physical assistance       General bed mobility  comments: reviewed log rolling with pt, increased cuing for performance minAx2 for coming fully onto side, modA x2 for moving LE off bed bringing trunk to upright and pad scooting hips to EoB  Transfers Overall transfer level: Needs assistance Equipment used: Rolling walker (2 wheeled) Transfers: Sit to/from Stand Sit to Stand: +2 physical assistance;Max assist         General transfer comment: maxAx2 for attempt to come to standing, pt unable to place L foot on floor for weightbearing.   Ambulation/Gait             General Gait Details: unable         Balance Overall balance assessment: Needs assistance Sitting-balance support: Feet supported;No upper extremity supported Sitting balance-Leahy Scale: Fair Sitting balance - Comments: able to sit with no UE support for short bouts increased posterior lean to decreased trunk flexion  Postural control: Posterior lean     Standing balance comment: did not make it to fully upright                            Cognition Arousal/Alertness: Awake/alert Behavior During Therapy: WFL for tasks assessed/performed Overall Cognitive Status: No family/caregiver present to determine baseline cognitive functioning Area of Impairment: Awareness                       Following Commands: Follows one step commands with increased time;Follows multi-step commands with increased time Safety/Judgement: Decreased awareness of deficits;Decreased awareness of safety Awareness: Intellectual Problem Solving: Slow processing;Decreased initiation;Requires verbal cues;Requires tactile cues General  Comments: agreeable to therapy but continues to have poor understanding of importance of mobility to recovery, requires increased cuin g         General Comments  VSS on RA      Pertinent Vitals/Pain Pain Assessment: 0-10 Pain Score: 9  Pain Location: abdomen Pain Descriptors / Indicators: Sore;Moaning Pain Intervention(s):  Limited activity within patient's tolerance;Monitored during session;Repositioned           PT Goals (current goals can now be found in the care plan section) Acute Rehab PT Goals Patient Stated Goal: to go home PT Goal Formulation: With patient Time For Goal Achievement: 06/18/20 Potential to Achieve Goals: Fair Progress towards PT goals: Progressing toward goals    Frequency    Min 2X/week      PT Plan Current plan remains appropriate       AM-PAC PT "6 Clicks" Mobility   Outcome Measure  Help needed turning from your back to your side while in a flat bed without using bedrails?: A Lot Help needed moving from lying on your back to sitting on the side of a flat bed without using bedrails?: Total Help needed moving to and from a bed to a chair (including a wheelchair)?: Total Help needed standing up from a chair using your arms (e.g., wheelchair or bedside chair)?: Total Help needed to walk in hospital room?: Total Help needed climbing 3-5 steps with a railing? : Total 6 Click Score: 7    End of Session Equipment Utilized During Treatment: Gait belt Activity Tolerance: Patient limited by pain Patient left: in bed;with call bell/phone within reach;with bed alarm set Nurse Communication: Mobility status;Patient requests pain meds;Other (comment) (inability to come to standing ) PT Visit Diagnosis: Other abnormalities of gait and mobility (R26.89);Difficulty in walking, not elsewhere classified (R26.2);Muscle weakness (generalized) (M62.81);Other symptoms and signs involving the nervous system (R29.898);Pain Pain - Right/Left:  (mid) Pain - part of body:  (abdomen)     Time: 1115-5208 PT Time Calculation (min) (ACUTE ONLY): 24 min  Charges:  $Therapeutic Activity: 23-37 mins                     Jame Seelig B. Migdalia Dk PT, DPT Acute Rehabilitation Services Pager [phone] Office [phone]    [place] 06/11/2020, 5:15 PM

## 2020-06-11 NOTE — TOC Progression Note (Signed)
Transition of Care [facility] - Progression Note    Patient Details  Name: Tonya Hughes MRN: [id number] Date of Birth: [birth year]  [facility] CM/SW [place], [place] Phone Number: 06/11/2020, 2:55 PM  Clinical Narrative:     CSW spoke with patients spouse Carlos American who has chose SNF placement at [facility]. Insurance authorization pending.  Patient has SN[facility] bed at [facility]. Insurance authorization pending.  CSW will continue to follow.  Expected Discharge Plan: [place] Barriers to Discharge: Continued Medical Work up  Expected Discharge Plan and Services Expected Discharge Plan: [place] arrangements for the past 2 months: Single Family Home                                       Social Determinants of Health (SDOH) Interventions    Readmission Risk Interventions Readmission Risk Prevention Plan 06/07/2020  Social Work Consult for [place] Planning/Counseling Complete  Some recent data might be hidden

## 2020-06-11 NOTE — Progress Notes (Signed)
[facility] Progress Note  5 Days Post-Op  Subjective: CC-  No new complaints. Continues to have abdominal pain. Denies n/v. Colostomy functioning.  Advanced to soft diet yesterday. She is eating <50% of her meals. States that she does not like any of the protein supplements, and the food here is not very good. Continues to refuse to get OOB.  Objective: Vital signs in last 24 hours: Temp:  [97.6 F (36.4 C)-98.5 F (36.9 C)] 97.9 F (36.6 C) (08/10 0829) Pulse Rate:  [71-78] 78 (08/10 0829) Resp:  [15-18] 16 (08/10 0829) BP: (162-190)/(54-81) 167/81 (08/10 0829) SpO2:  [95 %-98 %] 98 % (08/10 0829) Weight:  [79.8 kg] 79.8 kg (08/10 0355) Last BM Date: 06/10/20  Intake/Output from previous day: 08/09 0701 - 08/10 0700 In: 726 [P.O.:720; I.V.:6] Out: 2395 [Urine:2000; Stool:395] Intake/Output this shift: Total I/O In: 3 [I.V.:3] Out: -   PE: Gen:  Alert, NAD Pulm:  rate and effort normal Abd: Soft, ND, appropriately tender, vac to midline incision, +BS, ostomy soft stool in pouch  Lab Results:  Recent Labs    06/09/20 0500 06/10/20 0447  WBC 6.9 7.0  HGB 7.1* 7.5*  HCT 24.3* 25.2*  PLT 182 212   BMET Recent Labs    06/10/20 0447 06/11/20 0625  NA 135 136  K 4.0 3.8  CL 104 103  CO2 25 26  GLUCOSE 138* 139*  BUN 14 13  CREATININE 0.55 0.51  CALCIUM 8.0* 7.9*   PT/INR No results for input(s): LABPROT, INR in the last 72 hours. CMP     Component Value Date/Time   NA 136 06/11/2020 0625   K 3.8 06/11/2020 0625   CL 103 06/11/2020 0625   CO2 26 06/11/2020 0625   GLUCOSE 139 (H) 06/11/2020 0625   BUN 13 06/11/2020 0625   CREATININE 0.51 06/11/2020 0625   CALCIUM 7.9 (L) 06/11/2020 0625   PROT 4.9 (L) 06/09/2020 0633   ALBUMIN 1.4 (L) 06/10/2020 0447   AST 16 06/09/2020 0633   ALT 11 06/09/2020 0633   ALKPHOS 41 06/09/2020 0633   BILITOT 0.2 (L) 06/09/2020 0633   GFRNONAA >60 06/11/2020 0625   GFRAA >60 06/11/2020 0625   Lipase      Component Value Date/Time   LIPASE 31 05/31/2020 2038       Studies/Results: No results found.  Anti-infectives: Anti-infectives (From admission, onward)   Start     Dose/Rate Route Frequency Ordered Stop   06/07/20 1200  vancomycin (VANCOCIN) 50 mg/mL oral solution 125 mg     Discontinue     125 mg Oral Every 6 hours 06/07/20 1036     06/06/20 1615  metroNIDAZOLE (FLAGYL) IVPB 500 mg  Status:  Discontinued        500 mg 100 mL/hr over 60 Minutes Intravenous Every 8 hours 06/06/20 1605 06/07/20 1036   06/06/20 1400  piperacillin-tazobactam (ZOSYN) IVPB 3.375 g        3.375 g 100 mL/hr over 30 Minutes Intravenous Every 8 hours 06/06/20 1324 06/11/20 0628   06/06/20 0945  polymyxin B 500,000 Units, bacitracin 50,000 Units in sodium chloride 0.9 % 500 mL irrigation  Status:  Discontinued          As needed 06/06/20 0945 06/06/20 1242   06/06/20 0800  cefoTEtan (CEFOTAN) 2 g in sodium chloride 0.9 % 100 mL IVPB        2 g 200 mL/hr over 30 Minutes Intravenous On call to O.R. 06/05/20 0831 06/06/20  1030   06/02/20 1030  vancomycin (VANCOCIN) 50 mg/mL oral solution 125 mg  Status:  Discontinued        125 mg Oral 3 times daily before meals & bedtime 06/02/20 0822 06/06/20 1605   06/01/20 1130  metroNIDAZOLE (FLAGYL) tablet 500 mg  Status:  Discontinued        500 mg Oral 3 times daily 06/01/20 1124 06/02/20 8421       Assessment/Plan PAD chronic diastolic heart failure CKD stage II HTN T2DM HLD Chronic back pain Depression Hxaortobifem bypasson 04/18/2020 by Dr. Orland Jarred colitis - restarted on oral vancomycin, TRH discussing length of therapy with ID Malnutrition - prealbumin 11.3 (8/9)  Descending/sigmoid colonic stricture due to ischemic colitis. Contained perforation of the rectosigmoid junction -S/pExploratory laparotomy, LOA,Extended left colectomy with end colostomy (Hartmann's procedure)by Dr. Redmond Pulling, 06/06/2020 - POD #5 - surgical path: severely  active chronic colitis - completed 5 days zosyn. Discussed with dr. Tommy Medal and Pharmacy post op for recommendations  - Wound vac, change M/W/F - tolerating diet and ostomy functioning  FEN -1/2 TPN,soft diet VTE -SCds, subqheparin ID -C. Diff tx per TRH. IV Zosyn 8/5 >>8/10 Foley - out Follow up - Dr. Redmond Pulling  Plan: Wean TPN to 1/2 rate to see if this stimulates her appetite at all. Will ask dietician to see for other supplement recommendations. Encouraged her to ask family/friends to bring food from outside hospital if she will eat this better. Continue therapies, encourage mobilization. Looking into SNF.   LOS: 10 days    Wellington Hampshire, [facility] Surgery 06/11/2020, 10:42 AM Please see Amion for pager number during day hours 7:00am-4:30pm

## 2020-06-11 NOTE — Consult Note (Signed)
   [facility] Inpatient Consult   06/11/2020  Tonya Hughes August 02, 1951 [id number]  [place] [facility] [ACO] Patient:  Tonya Hughes   Patient screened for high risk score for unplanned readmission score and for hospitalizations to check if potential [place] Management service needs.  Review of patient's medical record reveals patient is being recommended for a skilled nursing facility stay .  Primary Care Provider is Monico Blitz, MD this provider, [facility] Internal Medicine is listed to provide the transition of care [TOC] for post hospital follow up.   Plan:  Continue to follow progress and disposition to assess for post hospital care management needs. Patient continue to need authorization and progress in diet and mobility noted.  Please place a [facility] Care Management consult as appropriate and for questions contact:   Natividad Brood, RN BSN [facility] Liaison  [phone] business mobile phone Toll free office [phone]  Fax number: [phone] [email or web address]

## 2020-06-12 DIAGNOSIS — E860 Dehydration: Secondary | ICD-10-CM

## 2020-06-12 LAB — CBC WITH DIFFERENTIAL/PLATELET
Abs Immature Granulocytes: 0.09 10*3/uL — ABNORMAL HIGH (ref 0.00–0.07)
Basophils Absolute: 0 10*3/uL (ref 0.0–0.1)
Basophils Relative: 0 %
Eosinophils Absolute: 0.5 10*3/uL (ref 0.0–0.5)
Eosinophils Relative: 7 %
HCT: 23.2 % — ABNORMAL LOW (ref 36.0–46.0)
Hemoglobin: 7 g/dL — ABNORMAL LOW (ref 12.0–15.0)
Immature Granulocytes: 1 %
Lymphocytes Relative: 31 %
Lymphs Abs: 2.3 10*3/uL (ref 0.7–4.0)
MCH: 29.8 pg (ref 26.0–34.0)
MCHC: 30.2 g/dL (ref 30.0–36.0)
MCV: 98.7 fL (ref 80.0–100.0)
Monocytes Absolute: 0.5 10*3/uL (ref 0.1–1.0)
Monocytes Relative: 6 %
Neutro Abs: 4 10*3/uL (ref 1.7–7.7)
Neutrophils Relative %: 55 %
Platelets: 224 10*3/uL (ref 150–400)
RBC: 2.35 MIL/uL — ABNORMAL LOW (ref 3.87–5.11)
RDW: 16.8 % — ABNORMAL HIGH (ref 11.5–15.5)
WBC: 7.4 10*3/uL (ref 4.0–10.5)
nRBC: 0 % (ref 0.0–0.2)

## 2020-06-12 LAB — GLUCOSE, CAPILLARY
Glucose-Capillary: 107 mg/dL — ABNORMAL HIGH (ref 70–99)
Glucose-Capillary: 110 mg/dL — ABNORMAL HIGH (ref 70–99)
Glucose-Capillary: 110 mg/dL — ABNORMAL HIGH (ref 70–99)
Glucose-Capillary: 117 mg/dL — ABNORMAL HIGH (ref 70–99)
Glucose-Capillary: 122 mg/dL — ABNORMAL HIGH (ref 70–99)
Glucose-Capillary: 129 mg/dL — ABNORMAL HIGH (ref 70–99)
Glucose-Capillary: 133 mg/dL — ABNORMAL HIGH (ref 70–99)

## 2020-06-12 LAB — RENAL FUNCTION PANEL
Albumin: 1.4 g/dL — ABNORMAL LOW (ref 3.5–5.0)
Anion gap: 6 (ref 5–15)
BUN: 11 mg/dL (ref 8–23)
CO2: 26 mmol/L (ref 22–32)
Calcium: 7.8 mg/dL — ABNORMAL LOW (ref 8.9–10.3)
Chloride: 104 mmol/L (ref 98–111)
Creatinine, Ser: 0.4 mg/dL — ABNORMAL LOW (ref 0.44–1.00)
GFR calc Af Amer: >60 mL/min (ref >60)
GFR calc non Af Amer: >60 mL/min (ref >60)
Glucose, Bld: 109 mg/dL — ABNORMAL HIGH (ref 70–99)
Phosphorus: 3.9 mg/dL (ref 2.5–4.6)
Potassium: 3.8 mmol/L (ref 3.5–5.1)
Sodium: 136 mmol/L (ref 135–145)

## 2020-06-12 LAB — PREPARE RBC (CROSSMATCH)

## 2020-06-12 LAB — MAGNESIUM: Magnesium: 2.1 mg/dL (ref 1.7–2.4)

## 2020-06-12 MED ORDER — SODIUM CHLORIDE 0.9% IV SOLUTION: Freq: Once | INTRAVENOUS | Status: AC

## 2020-06-12 MED ORDER — INSULIN ASPART 100 UNIT/ML ~~LOC~~ SOLN
0.0000 [IU] | Freq: Four times a day (QID) | SUBCUTANEOUS | Status: DC
  Administered 2020-06-12: 1 [IU] via SUBCUTANEOUS

## 2020-06-12 MED ORDER — TRAVASOL 10 % IV SOLN
INTRAVENOUS | Status: DC
  Filled 2020-06-12: qty 480

## 2020-06-12 NOTE — Progress Notes (Signed)
PROGRESS NOTE    Tonya Hughes  XLK:[id number] DOB: [birth year] DOA: 05/31/2020 PCP: Monico Blitz, MD    Chief Complaint  Patient presents with  . Abdominal Pain    Brief Narrative:   69 year old lady with prior history of chronic diastolic heart failure, diabetes mellitus, DVT, essential hypertension, s/p ileobifemoral bypass on 04/18/2020 recently discharged from the hospital for C. difficile colitis on 05/30/2020 presents with abdominal pain.  On arrival to ED she was found to have bowel ischemia.   Assessment & Plan:   Principal Problem:   Abdominal pain Active Problems:   Hypertension   Hypercholesteremia   Congestive heart failure (CHF) (HCC)   Peripheral arterial occlusive disease (HCC)   Hyponatremia   C. difficile colitis   Hypokalemia   Malnutrition of moderate degree  Descending/sigmoid colon stricture secondary to ischemic colitis Contained perforation of the rectosigmoid junction S/p exploratory laparotomy, extended left colectomy with end colostomy Hartman's procedure by Dr. Redmond Pulling on 8/5/20201. Surgical pathology reveals chronic colitis Wound VAC change Monday Wednesday and Friday. Colostomy functioning. Continue with TPN and soft diet. . H/O C diff colitis:  Started on oral vancomycin and completed the course of 10 days.  Continue with oral vancomycin till 8/20.  Diarrhea improving.     Hyponatremia Hypovolemic Improving.    Hypokalemia, hypomagnesemia:  Replaced.    AKI Sec to dehydration.    H/o PVD  S/p aorto bi fem by pass.  Continue with aspirin and statin.    Essential hpertension:  Well controlled.    Chronic diastolic heart failure Appears to be compensated.    Type 2 DM: CBG (last 3)  Recent Labs    06/12/20 0753 06/12/20 1222 06/12/20 1617  GLUCAP 110* 107* 133*   Resume SSI.  A1c is 6.9%  Pressure Injury 04/27/20 Heel Left Deep Tissue Pressure Injury - Purple or maroon localized area of discolored intact skin  or blood-filled blister due to damage of underlying soft tissue from pressure and/or shear. (Active)  04/27/20 2110  Location: Heel  Location Orientation: Left  Staging: Deep Tissue Pressure Injury - Purple or maroon localized area of discolored intact skin or blood-filled blister due to damage of underlying soft tissue from pressure and/or shear.  Wound Description (Comments):   Present on Admission:       Wound care consulted and recommendations given.   DVT prophylaxis: HEPARIN. Code Status: DNR Family Communication: none at bedside.  Disposition:   Status is: Inpatient  Remains inpatient appropriate because:Ongoing diagnostic testing needed not appropriate for outpatient work up, IV treatments appropriate due to intensity of illness or inability to take PO and Inpatient level of care appropriate due to severity of illness   Dispo: The patient is from: Home              Anticipated d/c is to: SNF              Anticipated d/c date is: > 3 days              Patient currently is not medically stable to d/c.       Consultants:   gen surgery.   Procedures: None.  Antimicrobials:  Anti-infectives (From admission, onward)   Start     Dose/Rate Route Frequency Ordered Stop   06/07/20 1200  vancomycin (VANCOCIN) 50 mg/mL oral solution 125 mg     Discontinue     125 mg Oral Every 6 hours 06/07/20 1036     06/06/20 1615  metroNIDAZOLE (  FLAGYL) IVPB 500 mg  Status:  Discontinued        500 mg 100 mL/hr over 60 Minutes Intravenous Every 8 hours 06/06/20 1605 06/07/20 1036   06/06/20 1400  piperacillin-tazobactam (ZOSYN) IVPB 3.375 g        3.375 g 100 mL/hr over 30 Minutes Intravenous Every 8 hours 06/06/20 1324 06/11/20 0628   06/06/20 0945  polymyxin B 500,000 Units, bacitracin 50,000 Units in sodium chloride 0.9 % 500 mL irrigation  Status:  Discontinued          As needed 06/06/20 0945 06/06/20 1242   06/06/20 0800  cefoTEtan (CEFOTAN) 2 g in sodium chloride 0.9 % 100 mL  IVPB        2 g 200 mL/hr over 30 Minutes Intravenous On call to O.R. 06/05/20 0831 06/06/20 1030   06/02/20 1030  vancomycin (VANCOCIN) 50 mg/mL oral solution 125 mg  Status:  Discontinued        125 mg Oral 3 times daily before meals & bedtime 06/02/20 0822 06/06/20 1605   06/01/20 1130  metroNIDAZOLE (FLAGYL) tablet 500 mg  Status:  Discontinued        500 mg Oral 3 times daily 06/01/20 1124 06/02/20 0822          Subjective: Reports having abd pain.   Objective: Vitals:   06/12/20 1245 06/12/20 1315 06/12/20 1337 06/12/20 1700  BP: (!) 174/56 (!) 180/59 (!) 196/69 (!) 176/54  Pulse: 71 73  77  Resp: 18 20  16   Temp: 97.9 F (36.6 C) 98.5 F (36.9 C)  97.7 F (36.5 C)  TempSrc: Oral Oral  Oral  SpO2: 98% 98%  99%  Weight:      Height:        Intake/Output Summary (Last 24 hours) at 06/12/2020 1909 Last data filed at 06/12/2020 1800 Gross per 24 hour  Intake 575 ml  Output 2090 ml  Net -1515 ml   Filed Weights   06/10/20 0423 06/11/20 0355 06/12/20 0438  Weight: 78.9 kg 79.8 kg 78.8 kg    Examination:  General exam: Appears calm and comfortable  Respiratory system: Clear to auscultation. Respiratory effort normal. Cardiovascular system: S1 & S2 heard, RRR. No JVD,  No pedal edema. Gastrointestinal system: Abdomen is soft , tenderness in the abdomen, bowel sounds heard.  Central nervous system: Alert and oriented. No focal neurological deficits. Extremities: Symmetric 5 x 5 power. Skin: heel pressure injury present.  Psychiatry: Mood & affect appropriate.     Data Reviewed: I have personally reviewed following labs and imaging studies  CBC: Recent Labs  Lab 06/06/20 0456 06/06/20 0456 06/07/20 0500 06/08/20 0500 06/09/20 0500 06/10/20 0447 06/12/20 0520  WBC 5.5   < > 9.2 9.6 6.9 7.0 7.4  NEUTROABS 2.6  --   --  6.5 4.1 3.9 4.0  HGB 10.1*   < > 9.4* 7.9* 7.1* 7.5* 7.0*  HCT 32.3*   < > 30.8* 26.2* 24.3* 25.2* 23.2*  MCV 96.7   < > 96.9 98.5  105.2* 99.6 98.7  PLT 230   < > 217 189 182 212 224   < > = values in this interval not displayed.    Basic Metabolic Panel: Recent Labs  Lab 06/07/20 0500 06/07/20 0500 06/08/20 0500 06/08/20 0500 06/09/20 0500 06/09/20 6734 06/10/20 0447 06/11/20 0625 06/12/20 0520  NA 135   < > 137   < > 132* 137 135 136 136  K 4.2   < > 4.2   < >  5.2* 3.6 4.0 3.8 3.8  CL 105   < > 106   < > 101 105 104 103 104  CO2 24   < > 24   < > 22 24 25 26 26   GLUCOSE 232*   < > 245*   < > 946* 151* 138* 139* 109*  BUN 20   < > 18   < > 14 15 14 13 11   CREATININE 0.66   < > 0.64   < > 0.66 0.60 0.55 0.51 0.40*  CALCIUM 8.0*   < > 8.2*   < > 8.0* 7.8* 8.0* 7.9* 7.8*  MG 1.8   < > 2.1  --  2.5* 2.0 2.1  --  2.1  PHOS 3.4  --   --   --  5.9* 3.5 3.7  --  3.9   < > = values in this interval not displayed.    GFR: Estimated Creatinine Clearance: 67.9 mL/min (A) (by C-G formula based on SCr of 0.4 mg/dL (L)).  Liver Function Tests: Recent Labs  Lab 06/06/20 0456 06/09/20 0500 06/09/20 0633 06/10/20 0447 06/12/20 0520  AST 14*  --  16  --   --   ALT 9  --  11  --   --   ALKPHOS 40  --  41  --   --   BILITOT 0.6  --  0.2*  --   --   PROT 6.0*  --  4.9*  --   --   ALBUMIN 2.1* 1.3* 1.4* 1.4* 1.4*    CBG: Recent Labs  Lab 06/12/20 0043 06/12/20 0633 06/12/20 0753 06/12/20 1222 06/12/20 1617  GLUCAP 122* 117* 110* 107* 133*     No results found for this or any previous visit (from the past 240 hour(s)).       Radiology Studies: No results found.      Scheduled Meds: . acetaminophen  1,000 mg Oral Q6H  . aspirin EC  81 mg Oral Daily  . atorvastatin  40 mg Oral Daily  . carvedilol  3.125 mg Oral BID WC  . Chlorhexidine Gluconate Cloth  6 each Topical Daily  . heparin injection (subcutaneous)  5,000 Units Subcutaneous Q8H  . insulin aspart  0-9 Units Subcutaneous Q6H  . lisinopril  20 mg Oral Daily  . methocarbamol  500 mg Oral TID  . PARoxetine  20 mg Oral Daily  .  saccharomyces boulardii  250 mg Oral BID  . sodium chloride flush  10-40 mL Intracatheter Q12H  . sodium chloride flush  3 mL Intravenous Q12H  . vancomycin  125 mg Oral Q6H   Continuous Infusions: . sodium chloride 250 mL (06/09/20 0903)  . sodium chloride Stopped (06/12/20 1300)  . lactated ringers 10 mL/hr at 06/06/20 0815  . TPN ADULT (ION) 40 mL/hr at 06/12/20 1723     LOS: 11 days        Hosie Poisson, MD [facility]   To contact the attending provider between 7A-7P or the covering provider during after hours 7P-7A, please log into the web site [email or web address] and access using universal [place] password for that web site. If you do not have the password, please call the hospital operator.  06/12/2020, 7:09 PM

## 2020-06-12 NOTE — Progress Notes (Signed)
PHARMACY - TOTAL PARENTERAL NUTRITION CONSULT NOTE  Indication: SBO  Patient Measurements: Height: 5' 3"  (160 cm) Weight: 79.8 kg (175 lb 14.8 oz) IBW/kg (Calculated) : 52.4 TPN AdjBW (KG): 56.8 Body mass index is 31.16 kg/m.  Assessment: 61 YOF readmitted with abdominal pain and found to have sigmoid colonic stricture causing pSBO. Per surgery team, patient is taking almost no full liquids and only drinking juice. A soft diet has been ordered but Surgery thinks that based on patient's history, she will not be able to tolerate solid foods. Pharmacy consulted to manage TPN for nutritional support.  Glucose / Insulin: A1c 6.9% - CBGs < 180. Required 9 units mSSI yesterday. Electrolytes: all WNL (home oral KCL d/c'ed, CoCa 9.88)  Renal: SCr < 1, BUN WNL LFTs / TGs: LFTs / tbili / TG WNL Prealbumin / albumin: pre-albumin improved to 11.3, albumin down to 1.4 Intake / Output; MIVF: NS at 75 ml/hr, UOP 0.8 mL/kg/hr, no colostomy O/P charted, net +5.1L GI Imaging: none since TPN Surgeries / Procedures:  8/5: left colectomy, sigmoid colectomy and placement of colostomy  Central access: PICC 06/04/20 TPN start date: 06/04/20  Nutritional Goals (per RD rec on 8/10): 1900-2100 kCal, 90-105g protein, > 1.9L fluid per day  Current Nutrition:  Soft diet - only ate breakfast yesterday and consume 50% of her meal, does not like supplements TPN   Plan:  Half-rate TPN per Surgery Continue TPN at 40 ml/hr (goal rate 80 ml/hr) to provide 48g AA, 182g CHO and 22g ILE for a total of 1032 kCal Electrolytes in TPN: increase Na to 100 mEq/L, K 66mq/L, add back Ca, Mag 136m/L, Phos 1573m/L, Cl:Ac 1:1 Add standard MVI and trace elements to TPN Reduce SSI to sensitive Q6H NS at 75 ml/hr per MD - watch volume status F/U AM labs, PO intake to wean off of TPN  Rena Sweeden D. DanMina MarbleharmD, BCPS, BCCRenton11/2021, 10:02 AM

## 2020-06-12 NOTE — Consult Note (Addendum)
WOC Nurse Consult Note: Reason for Consult:Midline abdominal full thickness post-op wound; Vac dressing change.   Wound bed: red and moist, small amt bloody drainage, no odor Dressing procedure/placement/frequency:Applied one piece black foam to 164m cont suction; pt tolerated with minimal amt discomfort.     WMadisonNurse ostomy consultnote Stoma type/location:LUQ colostomy pouch changed; stoma is red and viable, 1 1/4 inches, flush with skin level.  Mod amt tan brown liquid stool.  Applied one piece convex pouch and barrier ring.  Pt did not watch the process or ask questions; will need total assistance with pouching activities after discharge.  Plans to go to facility, according to progress notes.  4 sets of barrier rings/pouches left in room. No family members present. [place] Yes DJulien GirtMSN, RN, [place]

## 2020-06-12 NOTE — TOC Progression Note (Signed)
Transition of Care [facility]) - Progression Note    Patient Details  Name: Tonya Hughes MRN: [id number] Date of Birth: 06/02/1951  Transition of Care [facility]) CM/SW [place], [place] Phone Number: 06/12/2020, 2:04 PM  Clinical Narrative:     CSW received insurance authorization for patient. Insurance authorization approval number is [id number] and Everlene Balls number is [id number]. Next review date is 8/13.  Patient [facility] SN[facility]. Insurance authorization has been approved.  CSW will continue to follow.  Expected Discharge Plan: [place] Barriers to Discharge: Continued Medical Work up  Expected Discharge Plan and Services Expected Discharge Plan: [place] arrangements for the past 2 months: Single Family Home                                       Social Determinants of Health (SDOH) Interventions    Readmission Risk Interventions Readmission Risk Prevention Plan 06/07/2020  Social Work Consult for [place] Planning/Counseling Complete  Some recent data might be hidden

## 2020-06-12 NOTE — Progress Notes (Signed)
[facility] Progress Note  6 Days Post-Op  Subjective: CC-  Eating rice krisipies. States that she cannot eat much at a time because her stomach hurts/ feels full. Only meal recorded for yesterday was breakfast and she ate 50%. Does not like any of the supplements. Denies n/v.  Ostomy functioning. WBC 7.4, afebrile  Objective: Vital signs in last 24 hours: Temp:  [97.9 F (36.6 C)-98.7 F (37.1 C)] 98.7 F (37.1 C) (08/11 0438) Pulse Rate:  [69-78] 78 (08/11 0836) Resp:  [18] 18 (08/11 0438) BP: (134-184)/(51-60) 184/60 (08/11 0836) SpO2:  [97 %-99 %] 98 % (08/11 0438) Weight:  [78.8 kg] 78.8 kg (08/11 0438) Last BM Date: 06/11/20  Intake/Output from previous day: 08/10 0701 - 08/11 0700 In: 129 [P.O.:120; I.V.:9] Out: 1500 [Urine:1500] Intake/Output this shift: No intake/output data recorded.  PE: Gen: Alert, NAD Pulm: rate and effort normal Abd: Soft,ND, tender mostly left abdomen around ostomy, vac to midline incision, +BS, ostomy with new/clean pouch in place  Lab Results:  Recent Labs    06/10/20 0447 06/12/20 0520  WBC 7.0 7.4  HGB 7.5* 7.0*  HCT 25.2* 23.2*  PLT 212 224   BMET Recent Labs    06/11/20 0625 06/12/20 0520  NA 136 136  K 3.8 3.8  CL 103 104  CO2 26 26  GLUCOSE 139* 109*  BUN 13 11  CREATININE 0.51 0.40*  CALCIUM 7.9* 7.8*   PT/INR No results for input(s): LABPROT, INR in the last 72 hours. CMP     Component Value Date/Time   NA 136 06/12/2020 0520   K 3.8 06/12/2020 0520   CL 104 06/12/2020 0520   CO2 26 06/12/2020 0520   GLUCOSE 109 (H) 06/12/2020 0520   BUN 11 06/12/2020 0520   CREATININE 0.40 (L) 06/12/2020 0520   CALCIUM 7.8 (L) 06/12/2020 0520   PROT 4.9 (L) 06/09/2020 0633   ALBUMIN 1.4 (L) 06/12/2020 0520   AST 16 06/09/2020 0633   ALT 11 06/09/2020 0633   ALKPHOS 41 06/09/2020 0633   BILITOT 0.2 (L) 06/09/2020 0633   GFRNONAA >60 06/12/2020 0520   GFRAA >60 06/12/2020 0520   Lipase      Component Value Date/Time   LIPASE 31 05/31/2020 2038       Studies/Results: No results found.  Anti-infectives: Anti-infectives (From admission, onward)   Start     Dose/Rate Route Frequency Ordered Stop   06/07/20 1200  vancomycin (VANCOCIN) 50 mg/mL oral solution 125 mg     Discontinue     125 mg Oral Every 6 hours 06/07/20 1036     06/06/20 1615  metroNIDAZOLE (FLAGYL) IVPB 500 mg  Status:  Discontinued        500 mg 100 mL/hr over 60 Minutes Intravenous Every 8 hours 06/06/20 1605 06/07/20 1036   06/06/20 1400  piperacillin-tazobactam (ZOSYN) IVPB 3.375 g        3.375 g 100 mL/hr over 30 Minutes Intravenous Every 8 hours 06/06/20 1324 06/11/20 0628   06/06/20 0945  polymyxin B 500,000 Units, bacitracin 50,000 Units in sodium chloride 0.9 % 500 mL irrigation  Status:  Discontinued          As needed 06/06/20 0945 06/06/20 1242   06/06/20 0800  cefoTEtan (CEFOTAN) 2 g in sodium chloride 0.9 % 100 mL IVPB        2 g 200 mL/hr over 30 Minutes Intravenous On call to O.R. 06/05/20 0831 06/06/20 1030   06/02/20 1030  vancomycin (VANCOCIN)  50 mg/mL oral solution 125 mg  Status:  Discontinued        125 mg Oral 3 times daily before meals & bedtime 06/02/20 0822 06/06/20 1605   06/01/20 1130  metroNIDAZOLE (FLAGYL) tablet 500 mg  Status:  Discontinued        500 mg Oral 3 times daily 06/01/20 1124 06/02/20 1594       Assessment/Plan PAD chronic diastolic heart failure CKD stage II HTN T2DM HLD Chronic back pain Depression Hxaortobifem bypasson 04/18/2020 by Dr. Orland Jarred colitis -restarted on oral vancomycin, TRH discussing length of therapy with ID Malnutrition - prealbumin 11.3 (8/9)  Descending/sigmoid colonic stricture due to ischemic colitis. Contained perforation of the rectosigmoid junction -S/pExploratory laparotomy, LOA,Extended left colectomy with end colostomy (Hartmann's procedure)by Dr. Redmond Pulling, 06/06/2020 - POD #6 - surgical path: severely active  chronic colitis - completed 5 days zosyn. Discussed with dr. Tommy Medal and Pharmacy post op for recommendations  - Wound vac, change M/W/F - tolerating diet but not eating much, ostomy functioning  FEN -1/2 TPN,soft diet VTE -SCds, subqheparin ID -C. Diff tx per TRH. IV Zosyn 8/5 >>8/10 Foley -out Follow up - Dr. Redmond Pulling  Plan:Continue TPN at 1/2 rate for today. Continue soft diet and monitor PO intake. Continue therapies, encourage mobilization. Looking into SNF.   LOS: 11 days    Wellington Hampshire, [facility] Surgery 06/12/2020, 8:41 AM Please see Amion for pager number during day hours 7:00am-4:30pm

## 2020-06-13 LAB — BPAM RBC
Blood Product Expiration Date: 202108182359
ISSUE DATE / TIME: 202108111245
Unit Type and Rh: 600

## 2020-06-13 LAB — TYPE AND SCREEN
ABO/RH(D): A NEG
Antibody Screen: NEGATIVE
Unit division: 0

## 2020-06-13 LAB — IRON AND TIBC
Iron: 16 ug/dL — ABNORMAL LOW (ref 28–170)
Saturation Ratios: 10 % — ABNORMAL LOW (ref 10.4–31.8)
TIBC: 168 ug/dL — ABNORMAL LOW (ref 250–450)
UIBC: 152 ug/dL

## 2020-06-13 LAB — RETICULOCYTES
Immature Retic Fract: 34.1 % — ABNORMAL HIGH (ref 2.3–15.9)
RBC.: 3.05 MIL/uL — ABNORMAL LOW (ref 3.87–5.11)
Retic Count, Absolute: 115.3 10*3/uL (ref 19.0–186.0)
Retic Ct Pct: 3.8 % — ABNORMAL HIGH (ref 0.4–3.1)

## 2020-06-13 LAB — CBC
HCT: 29 % — ABNORMAL LOW (ref 36.0–46.0)
Hemoglobin: 8.8 g/dL — ABNORMAL LOW (ref 12.0–15.0)
MCH: 29 pg (ref 26.0–34.0)
MCHC: 30.3 g/dL (ref 30.0–36.0)
MCV: 95.7 fL (ref 80.0–100.0)
Platelets: 274 10*3/uL (ref 150–400)
RBC: 3.03 MIL/uL — ABNORMAL LOW (ref 3.87–5.11)
RDW: 16.8 % — ABNORMAL HIGH (ref 11.5–15.5)
WBC: 8.1 10*3/uL (ref 4.0–10.5)
nRBC: 0 % (ref 0.0–0.2)

## 2020-06-13 LAB — FERRITIN: Ferritin: 154 ng/mL (ref 11–307)

## 2020-06-13 LAB — SARS CORONAVIRUS 2 BY RT PCR (HOSPITAL ORDER, PERFORMED IN ~~LOC~~ HOSPITAL LAB): SARS Coronavirus 2: NEGATIVE

## 2020-06-13 LAB — GLUCOSE, CAPILLARY
Glucose-Capillary: 115 mg/dL — ABNORMAL HIGH (ref 70–99)
Glucose-Capillary: 119 mg/dL — ABNORMAL HIGH (ref 70–99)
Glucose-Capillary: 141 mg/dL — ABNORMAL HIGH (ref 70–99)
Glucose-Capillary: 118 mg/dL — ABNORMAL HIGH (ref 70–99)
Glucose-Capillary: 105 mg/dL — ABNORMAL HIGH (ref 70–99)

## 2020-06-13 LAB — FOLATE: Folate: 8.6 ng/mL (ref >5.9)

## 2020-06-13 LAB — VITAMIN B12: Vitamin B-12: 417 pg/mL (ref 180–914)

## 2020-06-13 MED ORDER — AMLODIPINE BESYLATE 5 MG PO TABS
5.0000 mg | ORAL_TABLET | Freq: Every day | ORAL | Status: DC
  Administered 2020-06-13 – 2020-06-14 (×2): 5 mg via ORAL
  Filled 2020-06-13 (×2): qty 1

## 2020-06-13 NOTE — Progress Notes (Addendum)
[facility] Progress Note  7 Days Post-Op  Subjective: CC-  Eating about 25% of her meals. Some nausea and appetite suppressed. No emesis. Colostomy functioning. Afebrile.  Objective: Vital signs in last 24 hours: Temp:  [97.7 F (36.5 C)-98.5 F (36.9 C)] 98.2 F (36.8 C) (08/12 0809) Pulse Rate:  [71-82] 72 (08/12 0809) Resp:  [16-20] 18 (08/12 0809) BP: (161-196)/(54-69) 172/61 (08/12 0809) SpO2:  [97 %-100 %] 97 % (08/12 0809) Last BM Date: 06/12/20  Intake/Output from previous day: 08/11 0701 - 08/12 0700 In: [place] [P.O.:1200; I.V.:20; Blood:315] Out: 2550 [Urine:2250; Stool:300] Intake/Output this shift: No intake/output data recorded.  PE: Gen: Alert, NAD Pulm: rate and effort normal Abd: Soft,ND, tender mostly left abdomen around ostomy,vac to midline incision,+BS, cannot see ostomythrough pouch/ small amount of liquid stool in pouch  Lab Results:  Recent Labs    06/12/20 0520  WBC 7.4  HGB 7.0*  HCT 23.2*  PLT 224   BMET Recent Labs    06/11/20 0625 06/12/20 0520  NA 136 136  K 3.8 3.8  CL 103 104  CO2 26 26  GLUCOSE 139* 109*  BUN 13 11  CREATININE 0.51 0.40*  CALCIUM 7.9* 7.8*   PT/INR No results for input(s): LABPROT, INR in the last 72 hours. CMP     Component Value Date/Time   NA 136 06/12/2020 0520   K 3.8 06/12/2020 0520   CL 104 06/12/2020 0520   CO2 26 06/12/2020 0520   GLUCOSE 109 (H) 06/12/2020 0520   BUN 11 06/12/2020 0520   CREATININE 0.40 (L) 06/12/2020 0520   CALCIUM 7.8 (L) 06/12/2020 0520   PROT 4.9 (L) 06/09/2020 0633   ALBUMIN 1.4 (L) 06/12/2020 0520   AST 16 06/09/2020 0633   ALT 11 06/09/2020 0633   ALKPHOS 41 06/09/2020 0633   BILITOT 0.2 (L) 06/09/2020 0633   GFRNONAA >60 06/12/2020 0520   GFRAA >60 06/12/2020 0520   Lipase     Component Value Date/Time   LIPASE 31 05/31/2020 2038       Studies/Results: No results found.  Anti-infectives: Anti-infectives (From admission, onward)    Start     Dose/Rate Route Frequency Ordered Stop   06/07/20 1200  vancomycin (VANCOCIN) 50 mg/mL oral solution 125 mg     Discontinue     125 mg Oral Every 6 hours 06/07/20 1036     06/06/20 1615  metroNIDAZOLE (FLAGYL) IVPB 500 mg  Status:  Discontinued        500 mg 100 mL/hr over 60 Minutes Intravenous Every 8 hours 06/06/20 1605 06/07/20 1036   06/06/20 1400  piperacillin-tazobactam (ZOSYN) IVPB 3.375 g        3.375 g 100 mL/hr over 30 Minutes Intravenous Every 8 hours 06/06/20 1324 06/11/20 0628   06/06/20 0945  polymyxin B 500,000 Units, bacitracin 50,000 Units in sodium chloride 0.9 % 500 mL irrigation  Status:  Discontinued          As needed 06/06/20 0945 06/06/20 1242   06/06/20 0800  cefoTEtan (CEFOTAN) 2 g in sodium chloride 0.9 % 100 mL IVPB        2 g 200 mL/hr over 30 Minutes Intravenous On call to O.R. 06/05/20 0831 06/06/20 1030   06/02/20 1030  vancomycin (VANCOCIN) 50 mg/mL oral solution 125 mg  Status:  Discontinued        125 mg Oral 3 times daily before meals & bedtime 06/02/20 0822 06/06/20 1605   06/01/20 1130  metroNIDAZOLE (  FLAGYL) tablet 500 mg  Status:  Discontinued        500 mg Oral 3 times daily 06/01/20 1124 06/02/20 3086       Assessment/Plan PAD chronic diastolic heart failure CKD stage II HTN T2DM HLD Chronic back pain Depression Hxaortobifem bypasson 04/18/2020 by Dr. Orland Jarred colitis -restarted on oral vancomycin, TRH discussing length of therapy with ID Malnutrition - prealbumin 11.3 (8/9)  Descending/sigmoid colonic stricture due to ischemic colitis. Contained perforation of the rectosigmoid junction -S/pExploratory laparotomy, LOA,Extended left colectomy with end colostomy (Hartmann's procedure)by Dr. Redmond Pulling, 06/06/2020 - POD #7 - surgical path:severely active chronic colitis -completed 5 days zosyn.Discussed with dr. Tommy Medal and Pharmacy post op for recommendations  - Wound vac, change M/W/F - toleratingdiet but not  eating much, ostomy functioning  FEN -soft diet VTE -SCds, subqheparin ID -C. Diff tx per TRH. IV Zosyn 8/5 >>8/10 Foley -out Follow up - Dr. Redmond Pulling  Plan:D/c TPN today and continue soft diet. Encourage PO intake. Continue therapies, encourage mobilization. Looking into SNF. [place] for discharge to SNF when bed available. Discharge instructions and follow up info on AVS.   LOS: 12 days    Wellington [place], [facility] 06/13/2020, 8:30 AM Please see Amion for pager number during day hours 7:00am-4:30pm

## 2020-06-13 NOTE — Progress Notes (Signed)
PROGRESS NOTE    Tonya Hughes  MBW:[id number] DOB: December 20, 1950 DOA: 05/31/2020 PCP: Monico Blitz, MD    Chief Complaint  Patient presents with  . Abdominal Pain    Brief Narrative:   69 year old lady with prior history of chronic diastolic heart failure, diabetes mellitus, DVT, essential hypertension, s/p ileobifemoral bypass on 04/18/2020 recently discharged from the hospital for C. difficile colitis on 05/30/2020 presents with abdominal pain.  On arrival to ED she was found to have bowel ischemia. Underwent exploratory laparotomy, extended left colectomy with end colostomy by Dr. Redmond Pulling on 06/06/2020.  Surgical pathology  shows chronic colitis.   Assessment & Plan:   Principal Problem:   Abdominal pain Active Problems:   Hypertension   Hypercholesteremia   Congestive heart failure (CHF) (HCC)   Peripheral arterial occlusive disease (HCC)   Hyponatremia   C. difficile colitis   Hypokalemia   Malnutrition of moderate degree  Descending/sigmoid colon stricture secondary to ischemic colitis Contained perforation of the rectosigmoid junction S/p exploratory laparotomy, extended left colectomy with end colostomy Hartman's procedure by Dr. Redmond Pulling on 06/06/2020. Surgical pathology reveals chronic colitis Wound VAC change Monday Wednesday and Friday. Colostomy functioning. TPN discontinued by surgery today. Continue with soft diet , will plan for discharge in the next 24 hours.  Pt denies any  Nausea, vomiting, has some mild abd crampy pain.  . H/O C diff colitis:  Started on oral vancomycin and completed the course of 10 days. Dr Marylyn Ishihara discussed with ID recommended to continue with oral vancomycin for 10 days after the end of zosyn. Hence continue with oral vancomycin till 8/20.  Diarrhea improving.   Anemia of acute illness superimposed on anemia of blood loss last admission.  Baseline hemoglobin around 11 from January 2021. A gradual decline from 04/2020 to 7 yesterday.  S/p 1 unit  of prbc transfusion ordered and repeat H&H is pending today.  Anemia panel will be ordered further evaluation.  Pt underwent flex sig on 05/20/2020 that showed colonic stricture and pathology consistent with ischemic colitis.  No rectal bleeding evident this admission.    Hyponatremia Hypovolemic Resolved.    Hypokalemia, hypomagnesemia:  Replaced.    AKI Sec to dehydration.  Resolved with IV fluids.    H/o PVD  S/p aorto bi fem by pass.  Continue with aspirin and statin.    Essential hpertension:  Sub optimally  controlled. Resume coreg and lisinopril.  Added norvasc 5 mg daily.    Chronic diastolic heart failure  She appears to be compensated.  Last echocardiogram from 04/2020 shows Left ventricular ejection fraction, by estimation, is 60 to 65%. The  left ventricle has normal function. The left ventricle has no regional wall motion abnormalities. There is mild left ventricular hypertrophy. Left ventricular diastolic parameters  are consistent with Grade I diastolic dysfunction (impaired relaxation).   Type 2 DM: CBG (last 3)  Recent Labs    06/13/20 0553 06/13/20 0808 06/13/20 1128  GLUCAP 115* 119* 141*   Resume SSI.  A1c is 6.9%   Pressure injury:  Pressure Injury 04/27/20 Heel Left Deep Tissue Pressure Injury - Purple or maroon localized area of discolored intact skin or blood-filled blister due to damage of underlying soft tissue from pressure and/or shear. (Active)  04/27/20 2110  Location: Heel  Location Orientation: Left  Staging: Deep Tissue Pressure Injury - Purple or maroon localized area of discolored intact skin or blood-filled blister due to damage of underlying soft tissue from pressure and/or shear.  Wound  Description (Comments):   Present on Admission:    Wound care consulted and recommendations given.   DVT prophylaxis: HEPARIN. Code Status: DNR Family Communication: none at bedside.  Disposition:   Status is: Inpatient  Remains  inpatient appropriate because:Inpatient level of care appropriate due to severity of illness, ongoing diarrhea, possible discharge in am.    Dispo: The patient is from: Home              Anticipated d/c is to: SNF              Anticipated d/c date is: 1 day              Patient currently is not medically stable to d/c.       Consultants:   gen surgery.   Procedures: None.  Antimicrobials:  Anti-infectives (From admission, onward)   Start     Dose/Rate Route Frequency Ordered Stop   06/07/20 1200  vancomycin (VANCOCIN) 50 mg/mL oral solution 125 mg     Discontinue     125 mg Oral Every 6 hours 06/07/20 1036     06/06/20 1615  metroNIDAZOLE (FLAGYL) IVPB 500 mg  Status:  Discontinued        500 mg 100 mL/hr over 60 Minutes Intravenous Every 8 hours 06/06/20 1605 06/07/20 1036   06/06/20 1400  piperacillin-tazobactam (ZOSYN) IVPB 3.375 g        3.375 g 100 mL/hr over 30 Minutes Intravenous Every 8 hours 06/06/20 1324 06/11/20 0628   06/06/20 0945  polymyxin B 500,000 Units, bacitracin 50,000 Units in sodium chloride 0.9 % 500 mL irrigation  Status:  Discontinued          As needed 06/06/20 0945 06/06/20 1242   06/06/20 0800  cefoTEtan (CEFOTAN) 2 g in sodium chloride 0.9 % 100 mL IVPB        2 g 200 mL/hr over 30 Minutes Intravenous On call to O.R. 06/05/20 0831 06/06/20 1030   06/02/20 1030  vancomycin (VANCOCIN) 50 mg/mL oral solution 125 mg  Status:  Discontinued        125 mg Oral 3 times daily before meals & bedtime 06/02/20 0822 06/06/20 1605   06/01/20 1130  metroNIDAZOLE (FLAGYL) tablet 500 mg  Status:  Discontinued        500 mg Oral 3 times daily 06/01/20 1124 06/02/20 0822         Subjective: Pt denies any nausea, vomiting . abdominal pain is improving.  No chest pain, or sob.   Objective: Vitals:   06/12/20 2338 06/13/20 0426 06/13/20 0809 06/13/20 1201  BP: (!) 172/58 (!) 165/57 (!) 172/61 (!) 166/65  Pulse: 76 82 72 75  Resp:  20 18 20   Temp: 98.4 F  (36.9 C) 98.4 F (36.9 C) 98.2 F (36.8 C) 97.9 F (36.6 C)  TempSrc: Oral Oral Oral Oral  SpO2: 97% 97% 97% 98%  Weight:      Height:        Intake/Output Summary (Last 24 hours) at 06/13/2020 1432 Last data filed at 06/13/2020 1000 Gross per 24 hour  Intake 1155 ml  Output 3000 ml  Net -1845 ml   Filed Weights   06/10/20 0423 06/11/20 0355 06/12/20 0438  Weight: 78.9 kg 79.8 kg 78.8 kg    Examination:  General exam: Alert and comfortable, not in any distress. Respiratory system: Clear to auscultation bilaterally, no wheezing or rhonchi Cardiovascular system: S1-S2 heard, regular rate rhythm, no JVD. Gastrointestinal system: Abdomen is  soft, mild gen tenderness, no distention, LUQ colostomy pouch present.  Central nervous system: alert and oriented. Grossly non focal.  Extremities: no cyanosis or clubbing.  Skin:no rashes seen.  Psychiatry: Mood is appropriate.     Data Reviewed: I have personally reviewed following labs and imaging studies  CBC: Recent Labs  Lab 06/07/20 0500 06/08/20 0500 06/09/20 0500 06/10/20 0447 06/12/20 0520  WBC 9.2 9.6 6.9 7.0 7.4  NEUTROABS  --  6.5 4.1 3.9 4.0  HGB 9.4* 7.9* 7.1* 7.5* 7.0*  HCT 30.8* 26.2* 24.3* 25.2* 23.2*  MCV 96.9 98.5 105.2* 99.6 98.7  PLT 217 189 182 212 229    Basic Metabolic Panel: Recent Labs  Lab 06/07/20 0500 06/07/20 0500 06/08/20 0500 06/08/20 0500 06/09/20 0500 06/09/20 0633 06/10/20 0447 06/11/20 0625 06/12/20 0520  NA 135   < > 137   < > 132* 137 135 136 136  K 4.2   < > 4.2   < > 5.2* 3.6 4.0 3.8 3.8  CL 105   < > 106   < > 101 105 104 103 104  CO2 24   < > 24   < > 22 24 25 26 26   GLUCOSE 232*   < > 245*   < > 946* 151* 138* 139* 109*  BUN 20   < > 18   < > 14 15 14 13 11   CREATININE 0.66   < > 0.64   < > 0.66 0.60 0.55 0.51 0.40*  CALCIUM 8.0*   < > 8.2*   < > 8.0* 7.8* 8.0* 7.9* 7.8*  MG 1.8   < > 2.1  --  2.5* 2.0 2.1  --  2.1  PHOS 3.4  --   --   --  5.9* 3.5 3.7  --  3.9   < >  = values in this interval not displayed.    GFR: Estimated Creatinine Clearance: 67.9 mL/min (A) (by C-G formula based on SCr of 0.4 mg/dL (L)).  Liver Function Tests: Recent Labs  Lab 06/09/20 0500 06/09/20 0633 06/10/20 0447 06/12/20 0520  AST  --  16  --   --   ALT  --  11  --   --   ALKPHOS  --  41  --   --   BILITOT  --  0.2*  --   --   PROT  --  4.9*  --   --   ALBUMIN 1.3* 1.4* 1.4* 1.4*    CBG: Recent Labs  Lab 06/12/20 1848 06/12/20 2338 06/13/20 0553 06/13/20 0808 06/13/20 1128  GLUCAP 129* 110* 115* 119* 141*     No results found for this or any previous visit (from the past 240 hour(s)).       Radiology Studies: No results found.      Scheduled Meds: . acetaminophen  1,000 mg Oral Q6H  . aspirin EC  81 mg Oral Daily  . atorvastatin  40 mg Oral Daily  . carvedilol  3.125 mg Oral BID WC  . Chlorhexidine Gluconate Cloth  6 each Topical Daily  . heparin injection (subcutaneous)  5,000 Units Subcutaneous Q8H  . lisinopril  20 mg Oral Daily  . methocarbamol  500 mg Oral TID  . PARoxetine  20 mg Oral Daily  . saccharomyces boulardii  250 mg Oral BID  . sodium chloride flush  10-40 mL Intracatheter Q12H  . sodium chloride flush  3 mL Intravenous Q12H  . vancomycin  125 mg Oral Q6H  Continuous Infusions: . sodium chloride 250 mL (06/09/20 0903)  . sodium chloride Stopped (06/12/20 1300)  . lactated ringers 10 mL/hr at 06/06/20 0815  . TPN ADULT (ION) 40 mL/hr at 06/12/20 1723     LOS: 12 days        Hosie Poisson, MD [facility]   To contact the attending provider between 7A-7P or the covering provider during after hours 7P-7A, please log into the web site [email or web address] and access using universal [place] password for that web site. If you do not have the password, please call the hospital operator.  06/13/2020, 2:32 PM

## 2020-06-13 NOTE — Progress Notes (Signed)
Physical Therapy Treatment Patient Details Name: Tonya Hughes MRN: [id number] DOB: [birth year] Today's Date: 06/13/2020    History of Present Illness Tonya Hughes is an 69 y.o. female past medical history of chronic diastolic heart failure, diabetes mellitus DVT essential hypertension status post aortobifemoral bypass on 04/18/2020 recently discharged from the hospital for C. difficile colitis on 05/30/2020 who presents to the hospital with ongoing abdominal pain.  During her recent admission she was transfused 2 units of packed red blood cells due to rectal bleeding GI was consulted who perform a flex sig on 05/20/2020 that showed colonic stricture with pathology consistent with ischemic colitis. Colostomy placed 8/5 with wound vac    PT Comments    Pt eating lunch initially, however agrees to work with therapy once she has finished. On subsequent visit, pt agrees to therapy however adamantly refuses to get up to chair but agrees to walk with therapy. Pt requires min A for getting to EoB. Pt attempts to stand in RW with maxAx2, unable to come to fully standing. Pt sits back quickly. Attempt to have pt work on pushing heel towards floor in sitting. Pt completed once and then shrieks and complains of pain in her L side. Pt flings herself back in bed refusing any further therapy. Educated pt on need for improving ROM in L LE, practice scooting to get back to transferring to her w/c and need for increased movement to improve motility. Pt refuses all education. D/c plans appropriate at this time. PT will continue to follow acutely.     Follow Up Recommendations  SNF     Equipment Recommendations  Hospital bed       Precautions / Restrictions Precautions Precautions: Fall;Other (comment) Precaution Comments: abdominal precautions, wound vac and ostomy Restrictions Weight Bearing Restrictions: No    Mobility  Bed Mobility Overal bed mobility: Needs Assistance Bed Mobility: Rolling Rolling: +2  for physical assistance;Min assist Sidelying to sit: +2 for physical assistance;Min assist   Sit to supine: Min guard   General bed mobility comments: min A for rolling onto side and min Ax2 for bringing trunk to upright, pt able to scoot hips to EoB without assistance today. Min guard for safety as pt flung herself back into bed due to pain in her side, pt able to pull LE back into bed without assist  Transfers Overall transfer level: Needs assistance Equipment used: Rolling walker (2 wheeled) Transfers: Sit to/from Stand Sit to Stand: +2 physical assistance;Max assist         General transfer comment: maxAx2 for attempt to come to standing, unable to place L foot on floor due to pain and contraction, attempted to have pt scoot laterally along side of bed toward HoB, pt refuses to attempt due to pain, flinging herself back on bed   Ambulation/Gait             General Gait Details: unable         Balance Overall balance assessment: Needs assistance Sitting-balance support: Feet supported;No upper extremity supported Sitting balance-Leahy Scale: Fair Sitting balance - Comments: able to sit with no UE support for short bouts increased posterior lean to decreased trunk flexion  Postural control: Posterior lean     Standing balance comment: did not make it to fully upright                            Cognition Arousal/Alertness: Awake/alert Behavior During Therapy: Agitated Overall Cognitive  Status: No family/caregiver present to determine baseline cognitive functioning Area of Impairment: Awareness                       Following Commands: Follows one step commands with increased time;Follows multi-step commands with increased time Safety/Judgement: Decreased awareness of deficits;Decreased awareness of safety Awareness: Intellectual Problem Solving: Slow processing;Decreased initiation;Requires verbal cues;Requires tactile cues General Comments:  pt initially agreeable to therapy however becomes increasingly aggitated, yelling at therapist that she can't do anything because of the pain and flops herself back on bed surface. Pt provided with education on need for movement to improve bowel function. Pt refuses any other mobility except for pulling herself up in the bed         General Comments General comments (skin integrity, edema, etc.): Pt refused all education on ROM to improve L LE function, need for mobility to improve bowel function, need to work on transfers to w/c if ambulating is too painful. Pt refused all education, stating she "can't do anything when I hurt like this."       Pertinent Vitals/Pain Pain Assessment: 0-10 Pain Score: 9  Pain Location: abdomen Pain Descriptors / Indicators: Sore;Moaning Pain Intervention(s): Limited activity within patient's tolerance;Monitored during session;Repositioned           PT Goals (current goals can now be found in the care plan section) Acute Rehab PT Goals Patient Stated Goal: to go home PT Goal Formulation: With patient Time For Goal Achievement: 06/18/20 Potential to Achieve Goals: Poor Progress towards PT goals: Not progressing toward goals - comment (refuses to progress due to pain in side and L LE )    Frequency    Min 2X/week      PT Plan Current plan remains appropriate       AM-PAC PT "6 Clicks" Mobility   Outcome Measure  Help needed turning from your back to your side while in a flat bed without using bedrails?: A Lot Help needed moving from lying on your back to sitting on the side of a flat bed without using bedrails?: A Lot Help needed moving to and from a bed to a chair (including a wheelchair)?: Total Help needed standing up from a chair using your arms (e.g., wheelchair or bedside chair)?: Total Help needed to walk in hospital room?: Total Help needed climbing 3-5 steps with a railing? : Total 6 Click Score: 8    End of Session Equipment  Utilized During Treatment: Gait belt Activity Tolerance: Patient limited by pain Patient left: in bed;with call bell/phone within reach;with bed alarm set Nurse Communication: Mobility status;Patient requests pain meds;Other (comment) (inability to come to standing ) PT Visit Diagnosis: Other abnormalities of gait and mobility (R26.89);Difficulty in walking, not elsewhere classified (R26.2);Muscle weakness (generalized) (M62.81);Other symptoms and signs involving the nervous system (R29.898);Pain Pain - Right/Left:  (mid) Pain - part of body:  (abdomen)     Time: 1194-1740 PT Time Calculation (min) (ACUTE ONLY): 18 min  Charges:  $Therapeutic Activity: 8-22 mins                     Finola Rosal B. Migdalia Dk PT, DPT Acute Rehabilitation Services Pager [phone] Office [phone]    [place] 06/13/2020, 1:31 PM

## 2020-06-13 NOTE — Progress Notes (Signed)
PHARMACY - TOTAL PARENTERAL NUTRITION CONSULT NOTE  Indication: SBO  Patient Measurements: Height: 5' 3"  (160 cm) Weight: 79.8 kg (175 lb 14.8 oz) IBW/kg (Calculated) : 52.4 TPN AdjBW (KG): 56.8 Body mass index is 31.16 kg/m.  Assessment: 82 YOF readmitted with abdominal pain and found to have sigmoid colonic stricture causing pSBO. Per surgery team, patient is taking almost no full liquids and only drinking juice. A soft diet has been ordered but Surgery thinks that based on patient's history, she will not be able to tolerate solid foods. Pharmacy consulted to manage TPN for nutritional support.  Glucose / Insulin: A1c 6.9% - CBGs < 180. Required 1 units sSSI yesterday. Electrolytes: 8/11 labs - all WNL (home oral KCL d/c'ed, CoCa 9.88)  Renal: SCr < 1, BUN WNL LFTs / TGs: LFTs / tbili / TG WNL Prealbumin / albumin: pre-albumin improved to 11.3, albumin down to 1.4 Intake / Output; MIVF: NS at 75 ml/hr, UOP 0.8 mL/kg/hr, no colostomy O/P charted, net +5.1L GI Imaging: none since TPN Surgeries / Procedures:  8/5: left colectomy, sigmoid colectomy and placement of colostomy  Central access: PICC 06/04/20 TPN start date: 06/04/20  Nutritional Goals (per RD rec on 8/10): 1900-2100 kCal, 90-105g protein, > 1.9L fluid per day  Current Nutrition:  Soft diet - eat ~25% of meals, does not like supplements TPN   Plan:  D/C TPN after current bag per Surgery: reduce TPN to 20 ml/hr at 1600, then stop at 1800 D/C SSI, continue Q8H CBG check to monitor glucose NS at 75 ml/hr per MD  D/C TPN labs and nursing care orders  Kelin Borum D. Mina Marble, PharmD, BCPS, [place] 06/13/2020, 9:43 AM

## 2020-06-14 DIAGNOSIS — E46 Unspecified protein-calorie malnutrition: Secondary | ICD-10-CM | POA: Diagnosis not present

## 2020-06-14 DIAGNOSIS — E860 Dehydration: Secondary | ICD-10-CM | POA: Diagnosis not present

## 2020-06-14 DIAGNOSIS — T8189XA Other complications of procedures, not elsewhere classified, initial encounter: Secondary | ICD-10-CM | POA: Diagnosis not present

## 2020-06-14 DIAGNOSIS — S31001A Unspecified open wound of lower back and pelvis with penetration into retroperitoneum, initial encounter: Secondary | ICD-10-CM | POA: Diagnosis not present

## 2020-06-14 DIAGNOSIS — M6281 Muscle weakness (generalized): Secondary | ICD-10-CM | POA: Diagnosis not present

## 2020-06-14 DIAGNOSIS — N179 Acute kidney failure, unspecified: Secondary | ICD-10-CM | POA: Diagnosis not present

## 2020-06-14 DIAGNOSIS — E7800 Pure hypercholesterolemia, unspecified: Secondary | ICD-10-CM | POA: Diagnosis not present

## 2020-06-14 DIAGNOSIS — A0472 Enterocolitis due to Clostridium difficile, not specified as recurrent: Secondary | ICD-10-CM | POA: Diagnosis not present

## 2020-06-14 DIAGNOSIS — E440 Moderate protein-calorie malnutrition: Secondary | ICD-10-CM

## 2020-06-14 DIAGNOSIS — R6889 Other general symptoms and signs: Secondary | ICD-10-CM | POA: Diagnosis not present

## 2020-06-14 DIAGNOSIS — I509 Heart failure, unspecified: Secondary | ICD-10-CM | POA: Diagnosis not present

## 2020-06-14 DIAGNOSIS — E871 Hypo-osmolality and hyponatremia: Secondary | ICD-10-CM | POA: Diagnosis not present

## 2020-06-14 DIAGNOSIS — K55039 Acute (reversible) ischemia of large intestine, extent unspecified: Secondary | ICD-10-CM | POA: Diagnosis not present

## 2020-06-14 DIAGNOSIS — E876 Hypokalemia: Secondary | ICD-10-CM | POA: Diagnosis not present

## 2020-06-14 DIAGNOSIS — Z95828 Presence of other vascular implants and grafts: Secondary | ICD-10-CM | POA: Diagnosis not present

## 2020-06-14 DIAGNOSIS — R109 Unspecified abdominal pain: Secondary | ICD-10-CM | POA: Diagnosis not present

## 2020-06-14 DIAGNOSIS — E1151 Type 2 diabetes mellitus with diabetic peripheral angiopathy without gangrene: Secondary | ICD-10-CM | POA: Diagnosis not present

## 2020-06-14 DIAGNOSIS — R1084 Generalized abdominal pain: Secondary | ICD-10-CM | POA: Diagnosis not present

## 2020-06-14 DIAGNOSIS — R1311 Dysphagia, oral phase: Secondary | ICD-10-CM | POA: Diagnosis not present

## 2020-06-14 DIAGNOSIS — I10 Essential (primary) hypertension: Secondary | ICD-10-CM | POA: Diagnosis not present

## 2020-06-14 DIAGNOSIS — M2550 Pain in unspecified joint: Secondary | ICD-10-CM | POA: Diagnosis not present

## 2020-06-14 DIAGNOSIS — Z7401 Bed confinement status: Secondary | ICD-10-CM | POA: Diagnosis not present

## 2020-06-14 DIAGNOSIS — D649 Anemia, unspecified: Secondary | ICD-10-CM | POA: Diagnosis not present

## 2020-06-14 DIAGNOSIS — J310 Chronic rhinitis: Secondary | ICD-10-CM | POA: Diagnosis not present

## 2020-06-14 DIAGNOSIS — R030 Elevated blood-pressure reading, without diagnosis of hypertension: Secondary | ICD-10-CM | POA: Diagnosis not present

## 2020-06-14 DIAGNOSIS — Z933 Colostomy status: Secondary | ICD-10-CM | POA: Diagnosis not present

## 2020-06-14 DIAGNOSIS — R262 Difficulty in walking, not elsewhere classified: Secondary | ICD-10-CM | POA: Diagnosis not present

## 2020-06-14 DIAGNOSIS — J449 Chronic obstructive pulmonary disease, unspecified: Secondary | ICD-10-CM | POA: Diagnosis not present

## 2020-06-14 DIAGNOSIS — I739 Peripheral vascular disease, unspecified: Secondary | ICD-10-CM | POA: Diagnosis not present

## 2020-06-14 DIAGNOSIS — Z743 Need for continuous supervision: Secondary | ICD-10-CM | POA: Diagnosis not present

## 2020-06-14 LAB — GLUCOSE, CAPILLARY
Glucose-Capillary: 81 mg/dL (ref 70–99)
Glucose-Capillary: 98 mg/dL (ref 70–99)

## 2020-06-14 MED ORDER — AMLODIPINE BESYLATE 10 MG PO TABS: 10.0000 mg | ORAL_TABLET | Freq: Every day | ORAL | 1 refills | Status: AC

## 2020-06-14 MED ORDER — VANCOMYCIN 50 MG/ML ORAL SOLUTION: 125.0000 mg | Freq: Four times a day (QID) | ORAL | 0 refills | Status: AC

## 2020-06-14 MED ORDER — SACCHAROMYCES BOULARDII 250 MG PO CAPS: 250.0000 mg | ORAL_CAPSULE | Freq: Two times a day (BID) | ORAL | 1 refills | Status: AC

## 2020-06-14 MED ORDER — AMLODIPINE BESYLATE 10 MG PO TABS: 10.0000 mg | ORAL_TABLET | Freq: Every day | ORAL | Status: DC

## 2020-06-14 MED ORDER — HEPARIN SOD (PORK) LOCK FLUSH 100 UNIT/ML IV SOLN
250.0000 [IU] | INTRAVENOUS | Status: DC | PRN
  Filled 2020-06-14: qty 2.5

## 2020-06-14 MED ORDER — AMLODIPINE BESYLATE 5 MG PO TABS: 5.0000 mg | ORAL_TABLET | Freq: Every day | ORAL | 1 refills | Status: DC

## 2020-06-14 MED ORDER — LISINOPRIL 20 MG PO TABS: 20.0000 mg | ORAL_TABLET | Freq: Every day | ORAL | 1 refills | Status: AC

## 2020-06-14 MED ORDER — OXYCODONE HCL 5 MG PO TABS: 5.0000 mg | ORAL_TABLET | Freq: Four times a day (QID) | ORAL | 0 refills | Status: AC | PRN

## 2020-06-14 NOTE — Discharge Summary (Addendum)
Physician Discharge Summary  Tonya Hughes KCL:[id number] DOB: [birth year] DOA: 05/31/2020  PCP: Monico Blitz, MD  Admit date: 05/31/2020 Discharge date: 06/14/2020  Admitted From:Home. Disposition: SNF.  Recommendations for Outpatient Follow-up:  1. Follow up with PCP in 1-2 weeks 2. Please obtain BMP/CBC in one week Please follow up with Gen Surgery as scheduled  Discharge Condition:stable.  CODE STATUS:DNR Diet recommendation: Heart Healthy   Brief/Interim Summary:  69 year old lady with prior history of chronic diastolic heart failure, diabetes mellitus, DVT, essential hypertension, s/p ileobifemoral bypass on 04/18/2020 recently discharged from the hospital for C. difficile colitis on 05/30/2020 presents with abdominal pain.  On arrival to ED she was found to have bowel ischemia. Underwent exploratory laparotomy, extended left colectomy with end colostomy by Dr. Redmond Pulling on 06/06/2020.  Surgical pathology  shows chronic colitis.    Discharge Diagnoses:  Principal Problem:   Abdominal pain Active Problems:   Hypertension   Hypercholesteremia   Congestive heart failure (CHF) (HCC)   Peripheral arterial occlusive disease (HCC)   Hyponatremia   C. difficile colitis   Hypokalemia   Malnutrition of moderate degree  Descending/sigmoid colon stricture secondary to ischemic colitis Contained perforation of the rectosigmoid junction S/p exploratory laparotomy, extended left colectomy with end colostomy Hartman's procedure by Dr. Redmond Pulling on 06/06/2020. Surgical pathology reveals chronic colitis Wound VAC change Monday Wednesday and Friday. Colostomy functioning. TPN discontinued by surgery today. Continue with soft diet , will plan for discharge in the next 24 hours.    H/O C diff colitis:  Started on oral vancomycin and completed the course of 10 days. Dr Marylyn Ishihara discussed with ID recommended to continue with oral vancomycin for 10 days after the end of zosyn. Hence continue with oral  vancomycin till 8/20.  Diarrhea improving.   Anemia of acute illness superimposed on anemia of blood loss last admission.  Baseline hemoglobin around 11 from January 2021. A gradual decline from 04/2020 to 7 yesterday.  S/p 1 unit of prbc transfusion ordered and repeat H&H is pending today.  Anemia panel will be ordered further evaluation.  Pt underwent flex sig on 05/20/2020 that showed colonic stricture and pathology consistent with ischemic colitis.  No rectal bleeding evident this admission.    Hyponatremia Hypovolemic Resolved.    Hypokalemia, hypomagnesemia:  Replaced.    AKI Sec to dehydration.  Resolved with IV fluids.    H/o PVD  S/p aorto bi fem by pass.  Continue with aspirin and statin.    Essential hpertension:  Sub optimally  controlled. Resume coreg and lisinopril.  Added norvasc 5 mg daily.    Chronic diastolic heart failure  She appears to be compensated.  Last echocardiogram from 04/2020 shows Left ventricular ejection fraction, by estimation, is 60 to 65%. The  left ventricle has normal function. The left ventricle has no regional wall motion abnormalities. There is mild left ventricular hypertrophy. Left ventricular diastolic parameters  are consistent with Grade I diastolic dysfunction (impaired relaxation).   Type 2 DM: CBG (last 3)  Recent Labs (last 2 labs)        Recent Labs    06/13/20 0553 06/13/20 0808 06/13/20 1128  GLUCAP 115* 119* 141*     Resume SSI.  A1c is 6.9%   Pressure injury:  Pressure Injury 04/27/20 Heel Left Deep Tissue Pressure Injury - Purple or maroon localized area of discolored intact skin or blood-filled blister due to damage of underlying soft tissue from pressure and/or shear. (Active)  04/27/20 2110  Location: Heel  Location Orientation: Left  Staging: Deep Tissue Pressure Injury - Purple or maroon localized area of discolored intact skin or blood-filled blister due to damage of underlying  soft tissue from pressure and/or shear.  Wound Description (Comments):   Present on Admission:    Wound care consulted and recommendations given.   Discharge Instructions  Discharge Instructions    Diet - low sodium heart healthy   Complete by: As directed    Discharge wound care:   Complete by: As directed    Negative pressure wound therapy  Every Mon-Wed-Fri 1000    Comments: WOC will change Vac and ostomy pouch Q M/W/F Question Answer Comment Amount of suction? 125 mm/Hg  Suction Type? Continuous    06/12/20 0752    06/08/20 0500   Wound care  Daily      Comments: Cleanse left and right inguinal wounds with NS and pat dry.  Apply NS moist gauze to wound bed. Cover with dry gauze and tape.  Change daily.   Increase activity slowly   Complete by: As directed      Allergies as of 06/14/2020      Reactions   Zanaflex [tizanidine Hcl] Hives, Swelling   Penicillins Itching, Rash   Did it involve swelling of the face/tongue/throat, SOB, or low BP? No Did it involve sudden or severe rash/hives, skin peeling, or any reaction on the inside of your mouth or nose? Yes Did you need to seek medical attention at a hospital or doctor's office? Yes When did it last happen?40 + years ago If all above answers are "NO", may proceed with cephalosporin use. *tolerated Ceftriaxone July 2021      Medication List    STOP taking these medications   ibuprofen 200 MG tablet Commonly known as: ADVIL   metroNIDAZOLE 500 MG tablet Commonly known as: Flagyl   potassium chloride SA 20 MEQ tablet Commonly known as: KLOR-CON     TAKE these medications   albuterol (2.5 MG/3ML) 0.083% nebulizer solution Commonly known as: PROVENTIL Take 3 mLs (2.5 mg total) by nebulization 2 (two) times daily.   amLODipine 10 MG tablet Commonly known as: NORVASC Take 1 tablet (10 mg total) by mouth daily. Start taking on: June 15, 2020   arformoterol 15 MCG/2ML Nebu Commonly known as:  BROVANA Take 2 mLs (15 mcg total) by nebulization 2 (two) times daily.   aspirin EC 81 MG tablet Take 81 mg by mouth daily.   atorvastatin 40 MG tablet Commonly known as: LIPITOR Take 1 tablet (40 mg total) by mouth daily. What changed: when to take this   carvedilol 3.125 MG tablet Commonly known as: COREG TAKE ONE TABLET BY MOUTH TWICE DAILY   fexofenadine 180 MG tablet Commonly known as: ALLEGRA Take 180 mg by mouth daily.   lisinopril 20 MG tablet Commonly known as: ZESTRIL Take 1 tablet (20 mg total) by mouth daily. Start taking on: June 15, 2020 What changed:   medication strength  how much to take   oxyCODONE 5 MG immediate release tablet Commonly known as: Oxy IR/ROXICODONE Take 1 tablet (5 mg total) by mouth every 6 (six) hours as needed for up to 3 days for moderate pain or severe pain (43m for moderate pain, 165mfor severe pain).   PARoxetine 20 MG tablet Commonly known as: PAXIL Take 20 mg by mouth daily.   saccharomyces boulardii 250 MG capsule Commonly known as: FLORASTOR Take 1 capsule (250 mg total) by mouth 2 (two) times daily.  vancomycin 50 mg/mL  oral solution Commonly known as: VANCOCIN Take 2.5 mLs (125 mg total) by mouth every 6 (six) hours for 7 days.            Discharge Care Instructions  (From admission, onward)         Start     Ordered   06/14/20 0000  Discharge wound care:       Comments: Negative pressure wound therapy  Every Mon-Wed-Fri 1000    Comments: WOC will change Vac and ostomy pouch Q M/W/F Question Answer Comment Amount of suction? 125 mm/Hg  Suction Type? Continuous    06/12/20 0752    06/08/20 0500   Wound care  Daily      Comments: Cleanse left and right inguinal wounds with NS and pat dry.  Apply NS moist gauze to wound bed. Cover with dry gauze and tape.  Change daily.   06/14/20 1126          Contact information for follow-up providers    Greer Pickerel, MD. Go on 07/12/2020.   Specialty:  General Surgery Why: Your appointment is 9/10 @3 :30pm  Please arrive 30 minutes prior to your appointment to check in and fill out paperwork. Bring photo ID and insurance information. Contact information: [place] [address] [phone]        Monico Blitz, MD. Schedule an appointment as soon as possible for a visit in 1 week(s).   Specialty: Internal Medicine Contact information: [place] [address] [phone]            Contact information for after-discharge care    [facility] .   Service: Skilled Nursing Contact information: [address] [address] [phone]                 Allergies  Allergen Reactions  . Zanaflex [Tizanidine Hcl] Hives and Swelling  . Penicillins Itching and Rash    Did it involve swelling of the face/tongue/throat, SOB, or low BP? No Did it involve sudden or severe rash/hives, skin peeling, or any reaction on the inside of your mouth or nose? Yes Did you need to seek medical attention at a hospital or doctor's office? Yes When did it last happen?40 + years ago If all above answers are "NO", may proceed with cephalosporin use. *tolerated Ceftriaxone July 2021     Consultations:  Surgery    Procedures/Studies: DG Abdomen Acute W/Chest  Result Date: 06/01/2020 CLINICAL DATA:  C difficile colitis EXAM: DG ABDOMEN ACUTE W/ 1V CHEST COMPARISON:  Chest x-ray 04/22/2020 FINDINGS: Cardiomegaly and low inspiratory volumes. Atherosclerotic calcifications are present in the transverse aorta. No free air on the upright radiograph. Mild gaseous distension of small bowel in the left lower quadrant. Maximal bowel diameter 2.6 cm. Normal colonic gas pattern. No evidence of colonic obstruction. Surgical clips in the right upper quadrant consistent with prior cholecystectomy. Extensive prior thoracolumbosacral fusion surgical changes. IMPRESSION:  1. No acute cardiopulmonary process. 2. Single loop of mildly dilated and air-filled small bowel in the left lower quadrant. Differential considerations include early or partial small bowel obstruction versus local ileus related to an intra-abdominal infectious/inflammatory process. 3. Aortic atherosclerosis. Electronically Signed   By: Jacqulynn Cadet M.D.   On: 06/01/2020 09:52   DG BE (COLON)W SINGLE CM (SOL OR THIN BA)  Result Date: 05/28/2020 CLINICAL DATA:  Ischemic stricture left colon EXAM: WATER SOLUBLE CONTRAST  ENEMA TECHNIQUE: Barium enema tip was placed in the patient's rectum. Water-soluble contrast material was refluxed in a retrograde fashion to fill the left colon. FLUOROSCOPY TIME:  Fluoroscopy Time:  0 minutes and 48 seconds. Radiation Exposure Index (if provided by the fluoroscopic device): 1,721 mGy Number of Acquired Spot Images: COMPARISON:  CTA 05/17/2020 FINDINGS: Pre-procedure KUB shows a nonspecific bowel gas pattern with extensive lumbar fusion hardware, incompletely visualized. Patient was placed left-side down decubitus on the fluoro table and monitored fluoroscopically while water-soluble contrast was introduced into the rectum. Images were obtained in the decubitus and supine positions. Contrast refluxed into a stenotic sigmoid colon and distal descending colon. Proximal and mid descending colon are nonstenotic. As the contrast refluxed into the region of the splenic flexure, it began flowing around the enema tip and the patient was unable to retain further contrast. No further filling could be obtained. IMPRESSION: Long segment left colonic smooth stricture extending from approximately the mid descending colon to the rectum. There is abrupt transition from normal to stenotic colon. Electronically Signed   By: Misty Stanley M.D.   On: 05/28/2020 14:26   [place] EKG SITE RITE  Result Date: 06/04/2020 If Site Rite image not attached, placement could not be confirmed due to current  cardiac rhythm.  CT Angio Abd/Pel W and/or Wo Contrast  Result Date: 05/17/2020 CLINICAL DATA:  69 year old female with generalized abdominal pain and decreased p.o. intake. Concern for ischemia. EXAM: CTA ABDOMEN AND PELVIS WITHOUT AND WITH CONTRAST TECHNIQUE: Multidetector CT imaging of the abdomen and pelvis was performed using the standard protocol during bolus administration of intravenous contrast. Multiplanar reconstructed images and MIPs were obtained and reviewed to evaluate the vascular anatomy. CONTRAST:  190m OMNIPAQUE IOHEXOL 350 MG/ML SOLN COMPARISON:  CT abdomen pelvis dated 04/20/2020. FINDINGS: VASCULAR Aorta: There is advanced atherosclerotic calcification. No aneurysmal dilatation or dissection. There is complete occlusion of the distal abdominal aorta. Postsurgical changes of an aortobifemoral bypass graft. Slight irregularity of the aortic and stem oasis, likely postsurgical. The bypass graft is patent to the level of the common femoral artery. There is stranding and fluid surrounding the aortic and stem oasis, likely postsurgical. No drainable fluid collection or abscess identified at this location. Celiac: There is atherosclerotic calcification of the origin of the celiac axis. The celiac artery and its major branches remain patent. SMA: Atherosclerotic calcification of the SMA. The SMA remains patent. Renals: The renal arteries are patent. 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 IMA: There is nonvisualization of the origin of the IMA possibly due to compression by periaortic fluid and edema. There is reconstitution of flow within the IMA. Inflow: There is advanced atherosclerotic calcification of the iliac arteries. There is high-grade occlusion of the common iliac arteries bilaterally with very diminished flow in the native external and internal iliac arteries. The iliac limbs of the bypass graft are patent. Proximal Outflow: There is high-grade narrowing of the distal  anastomosis of the left iliac limb of the bypass graft at the anastomosis with the common femoral artery. There is advanced atherosclerotic calcification of the proximal portion of the common femoral, superficial and deep femoral arteries. These arteries appear narrowed with diminished flow but remain patent. There is atherosclerotic calcification and luminal narrowing of the right common femoral artery and visualized proximal superficial and deep femoral arteries. These arteries however remain patent. Veins: The IVC is unremarkable. The SMV, splenic vein, and main portal vein are patent. No portal venous gas. Review of the MIP  images confirms the above findings. NON-VASCULAR Lower chest: The visualized lung bases are clear. There is multi vessel coronary vascular calcification. No intra-abdominal free air or free fluid. Hepatobiliary: The liver is unremarkable. No intrahepatic biliary ductal dilatation. Cholecystectomy. No retained calcified stone noted in the central CBD. Pancreas: Unremarkable. No pancreatic ductal dilatation or surrounding inflammatory changes. Spleen: Normal in size without focal abnormality. Adrenals/Urinary Tract: The adrenal glands are unremarkable. There is mild right hydronephrosis similar to prior CT. There is no hydronephrosis on the left. Evaluation of the ureters is limited due to streak artifact caused by spinal hardware. The urinary bladder is unremarkable. Stomach/Bowel: There is diffuse circumferential thickening of the distal descending colon and rectosigmoid consistent with colitis. Interval improvement in the inflammatory changes of the splenic flexure since the prior CT. No pneumatosis. No definite evidence of perforation. No bowel obstruction. There is a 5 cm outpouching of the proximal stomach, likely a gastric diverticula. Lymphatic: No adenopathy. Reproductive: Hysterectomy. Other: Somewhat loculated fluid collections in the groins bilaterally adjacent to the distal bypass  graft anastomosis most consistent with seroma. Developing infection is not excluded. These fluid collections have increased in size since the prior CT and measure approximately 5.5 x 1.5 cm on the left and 4.0 x 2.2 cm on the right. Musculoskeletal: Osteopenia with degenerative changes of the spine and severe postsurgical changes and posterior fusion. No acute osseous pathology. IMPRESSION: 1. Colitis of the distal descending colon and rectosigmoid with improved inflammatory changes of the splenic flexure seen on the prior CT. No pneumatosis. No free air or portal venous gas. 2. Occluded distal native aorta and an aorto bi femoral bypass graft. The graft is patent. 3. Atherosclerotic calcification and narrowing of the bilateral femoral arteries. 4. Loculated appearing fluid collections adjacent to the femoral and sternum oasis of the bypass graft, increased in size since the prior CT. These may represent seroma although an infectious process is not excluded. 5. Mild right hydronephrosis similar to prior CT. 6. A 5 cm gastric diverticula. 7. Aortic Atherosclerosis (ICD10-I70.0). Electronically Signed   By: Anner Crete M.D.   On: 05/17/2020 21:15      Subjective: No new complaints.   Discharge Exam: Vitals:   06/14/20 0838 06/14/20 1103  BP: (!) 169/60 (!) 175/60  Pulse: 70 70  Resp: 16 18  Temp: 98.2 F (36.8 C) 97.8 F (36.6 C)  SpO2: 96% 98%   Vitals:   06/14/20 0051 06/14/20 0611 06/14/20 0838 06/14/20 1103  BP: (!) 161/67 (!) 171/51 (!) 169/60 (!) 175/60  Pulse: 72 71 70 70  Resp: 18 15 16 18   Temp: 98.3 F (36.8 C) 98 F (36.7 C) 98.2 F (36.8 C) 97.8 F (36.6 C)  TempSrc: Oral Oral Oral Oral  SpO2: 98% 95% 96% 98%  Weight:      Height:        General: Pt is alert, awake, not in acute distress Cardiovascular: RRR, S1/S2 +, no rubs, no gallops Respiratory: CTA bilaterally, no wheezing, no rhonchi Abdominal: Soft, NT, ND, bowel sounds + Extremities: no edema, no  cyanosis    The results of significant diagnostics from this hospitalization (including imaging, microbiology, ancillary and laboratory) are listed below for reference.     Microbiology: Recent Results (from the past 240 hour(s))  SARS Coronavirus 2 by RT PCR (hospital order, performed in [facility] hospital lab) Nasopharyngeal Nasopharyngeal Swab     Status: None   Collection Time: 06/13/20  5:09 PM   Specimen: Nasopharyngeal Swab  Result Value Ref Range Status   SARS Coronavirus 2 NEGATIVE NEGATIVE Final    Comment: (NOTE) SARS-CoV-2 target nucleic acids are NOT DETECTED.  The SARS-CoV-2 RNA is generally detectable in upper and lower respiratory specimens during the acute phase of infection. The lowest concentration of SARS-CoV-2 viral copies this assay can detect is 250 copies / mL. A negative result does not preclude SARS-CoV-2 infection and should not be used as the sole basis for treatment or other patient management decisions.  A negative result may occur with improper specimen collection / handling, submission of specimen other than nasopharyngeal swab, presence of viral mutation(s) within the areas targeted by this assay, and inadequate number of viral copies (<250 copies / mL). A negative result must be combined with clinical observations, patient history, and epidemiological information.  Fact Sheet for Patients:   StrictlyIdeas.no  Fact Sheet for Healthcare Providers: BankingDealers.co.za  This test is not yet approved or  cleared by the [place] FDA and has been authorized for detection and/or diagnosis of SARS-CoV-2 by FDA under an Emergency Use Authorization (EUA).  This EUA will remain in effect (meaning this test can be used) for the duration of the COVID-19 declaration under Section 564(b)(1) of the Act, 21 U.S.C. section 360bbb-3(b)(1), unless the authorization is terminated or revoked sooner.  Performed  at [facility], [place] [address] [place], [place] [address]      Labs: BNP (last 3 results) No results for input(s): BNP in the last 8760 hours. Basic Metabolic Panel: Recent Labs  Lab 06/08/20 0500 06/08/20 0500 06/09/20 0500 06/09/20 9326 06/10/20 0447 06/11/20 0625 06/12/20 0520  NA 137   < > 132* 137 135 136 136  K 4.2   < > 5.2* 3.6 4.0 3.8 3.8  CL 106   < > 101 105 104 103 104  CO2 24   < > 22 24 25 26 26   GLUCOSE 245*   < > 946* 151* 138* 139* 109*  BUN 18   < > 14 15 14 13 11   CREATININE 0.64   < > 0.66 0.60 0.55 0.51 0.40*  CALCIUM 8.2*   < > 8.0* 7.8* 8.0* 7.9* 7.8*  MG 2.1  --  2.5* 2.0 2.1  --  2.1  PHOS  --   --  5.9* 3.5 3.7  --  3.9   < > = values in this interval not displayed.   Liver Function Tests: Recent Labs  Lab 06/09/20 0500 06/09/20 0633 06/10/20 0447 06/12/20 0520  AST  --  16  --   --   ALT  --  11  --   --   ALKPHOS  --  41  --   --   BILITOT  --  0.2*  --   --   PROT  --  4.9*  --   --   ALBUMIN 1.3* 1.4* 1.4* 1.4*   No results for input(s): LIPASE, AMYLASE in the last 168 hours. No results for input(s): AMMONIA in the last 168 hours. CBC: Recent Labs  Lab 06/08/20 0500 06/09/20 0500 06/10/20 0447 06/12/20 0520 06/13/20 1740  WBC 9.6 6.9 7.0 7.4 8.1  NEUTROABS 6.5 4.1 3.9 4.0  --   HGB 7.9* 7.1* 7.5* 7.0* 8.8*  HCT 26.2* 24.3* 25.2* 23.2* 29.0*  MCV 98.5 105.2* 99.6 98.7 95.7  PLT 189 182 212 224 274   Cardiac Enzymes: No results for input(s): CKTOTAL, CKMB, CKMBINDEX, TROPONINI in the last 168 hours. BNP: Invalid input(s):  POCBNP CBG: Recent Labs  Lab 06/13/20 1128 06/13/20 1644 06/13/20 2203 06/14/20 0607 06/14/20 1102  GLUCAP 141* 118* 105* 81 98   D-Dimer No results for input(s): DDIMER in the last 72 hours. Hgb A1c No results for input(s): HGBA1C in the last 72 hours. Lipid Profile No results for input(s): CHOL, HDL, LDLCALC, TRIG, CHOLHDL, LDLDIRECT in the last 72 hours. Thyroid function  studies No results for input(s): TSH, T4TOTAL, T3FREE, THYROIDAB in the last 72 hours.  Invalid input(s): FREET3 Anemia work up Recent Labs    06/13/20 1740  VITAMINB12 417  FOLATE 8.6  FERRITIN 154  TIBC 168*  IRON 16*  RETICCTPCT 3.8*   Urinalysis    Component Value Date/Time   COLORURINE YELLOW 04/15/2020 1505   APPEARANCEUR CLEAR 04/15/2020 1505   LABSPEC 1.011 04/15/2020 1505   PHURINE 6.0 04/15/2020 1505   GLUCOSEU NEGATIVE 04/15/2020 1505   HGBUR SMALL (A) 04/15/2020 1505   BILIRUBINUR NEGATIVE 04/15/2020 1505   KETONESUR NEGATIVE 04/15/2020 1505   PROTEINUR NEGATIVE 04/15/2020 1505   NITRITE NEGATIVE 04/15/2020 1505   LEUKOCYTESUR NEGATIVE 04/15/2020 1505   Sepsis Labs Invalid input(s): PROCALCITONIN,  WBC,  LACTICIDVEN Microbiology Recent Results (from the past 240 hour(s))  SARS Coronavirus 2 by RT PCR (hospital order, performed in [place] hospital lab) Nasopharyngeal Nasopharyngeal Swab     Status: None   Collection Time: 06/13/20  5:09 PM   Specimen: Nasopharyngeal Swab  Result Value Ref Range Status   SARS Coronavirus 2 NEGATIVE NEGATIVE Final    Comment: (NOTE) SARS-CoV-2 target nucleic acids are NOT DETECTED.  The SARS-CoV-2 RNA is generally detectable in upper and lower respiratory specimens during the acute phase of infection. The lowest concentration of SARS-CoV-2 viral copies this assay can detect is 250 copies / mL. A negative result does not preclude SARS-CoV-2 infection and should not be used as the sole basis for treatment or other patient management decisions.  A negative result may occur with improper specimen collection / handling, submission of specimen other than nasopharyngeal swab, presence of viral mutation(s) within the areas targeted by this assay, and inadequate number of viral copies (<250 copies / mL). A negative result must be combined with clinical observations, patient history, and epidemiological information.  Fact  Sheet for Patients:   StrictlyIdeas.no  Fact Sheet for Healthcare Providers: BankingDealers.co.za  This test is not yet approved or  cleared by the [place] FDA and has been authorized for detection and/or diagnosis of SARS-CoV-2 by FDA under an Emergency Use Authorization (EUA).  This EUA will remain in effect (meaning this test can be used) for the duration of the COVID-19 declaration under Section 564(b)(1) of the Act, 21 U.S.C. section 360bbb-3(b)(1), unless the authorization is terminated or revoked sooner.  Performed at [facility], [place] [address] [place], [place] [address]      Time coordinating discharge: 35 minutes.    SIGNED:   Hosie Poisson, MD  [facility]

## 2020-06-14 NOTE — Progress Notes (Signed)
[facility] [phone] and report given to Normand Sloop, LPN.Questions answered and concerns clarified.Verbalized understanding.  Both Patient spouse Carlos American and Maryan Char contact numbers communicated to the [facility].  Awaiting [facility] to transfer out.

## 2020-06-14 NOTE — Care Management Important Message (Signed)
Important Message  Patient Details  Name: Tonya Hughes MRN: [id number] Date of Birth: 19-Nov-1950   Medicare Important Message Given:  Yes     Shelda Altes 06/14/2020, 12:01 PM

## 2020-06-14 NOTE — Consult Note (Signed)
WOC Nurse Consult Note: Reason for Consult:Midline abdominalfull thickness post-opwound; Vac dressing change performed.   Wound bed:red and moist, small amt bloody drainage, no odor Dressing procedure/placement/frequency:Applied one piece black foam to 169m cont suction; pt tolerated with minimal amt discomfort.  Portable Vac charging in room for use upon discharge.    [place]urse ost[place] consult[place] Stoma type/location:LUQ colostomypouch changed; stoma is red and viable, 1 1/4 inches, flush with skin level.  Mod amt tan brown liquid stool. Applied one piece convex pouch and barrier ring.  Pt did not watch the process or ask questions; will need total assistance with pouching activities after discharge.  Plans to go to facility, according to progress notes.  3 sets of barrier rings/pouches left in room. No family members present. [place] in [place]: Yes DJulien GirtMSN, RN, [place]

## 2020-06-14 NOTE — Progress Notes (Signed)
[facility] Progress Note  8 Days Post-Op  Subjective: CC-  Feels about the same. Continues to have some left sided abdominal pain around the colostomy, no better no worse. Mild nausea at times, no emesis. Colotomy functioning. Still only eating about 25% of her meals.  Worked with PT yesterday but was difficult to motivate.  Objective: Vital signs in last 24 hours: Temp:  [97.9 F (36.6 C)-98.3 F (36.8 C)] 98 F (36.7 C) (08/13 0611) Pulse Rate:  [69-75] 71 (08/13 0611) Resp:  [15-20] 15 (08/13 0611) BP: (160-171)/(51-67) 171/51 (08/13 0611) SpO2:  [95 %-99 %] 95 % (08/13 0611) Last BM Date: 06/12/20  Intake/Output from previous day: 08/12 0701 - 08/13 0700 In: 790.5 [P.O.:360; I.V.:430.5] Out: 1350 [Urine:1300; Stool:50] Intake/Output this shift: Total I/O In: -  Out: 600 [Urine:600]  PE: Gen: Alert, NAD Pulm: rate and effort normal Abd: Soft,ND,tender mostly left abdomen around ostomy/ no rigidity or peritonitis,vac to midline incision,+BS, cannot see ostomythrough pouch/ small amount of liquid stool in pouch  Lab Results:  Recent Labs    06/12/20 0520 06/13/20 1740  WBC 7.4 8.1  HGB 7.0* 8.8*  HCT 23.2* 29.0*  PLT 224 274   BMET Recent Labs    06/12/20 0520  NA 136  K 3.8  CL 104  CO2 26  GLUCOSE 109*  BUN 11  CREATININE 0.40*  CALCIUM 7.8*   PT/INR No results for input(s): LABPROT, INR in the last 72 hours. CMP     Component Value Date/Time   NA 136 06/12/2020 0520   K 3.8 06/12/2020 0520   CL 104 06/12/2020 0520   CO2 26 06/12/2020 0520   GLUCOSE 109 (H) 06/12/2020 0520   BUN 11 06/12/2020 0520   CREATININE 0.40 (L) 06/12/2020 0520   CALCIUM 7.8 (L) 06/12/2020 0520   PROT 4.9 (L) 06/09/2020 0633   ALBUMIN 1.4 (L) 06/12/2020 0520   AST 16 06/09/2020 0633   ALT 11 06/09/2020 0633   ALKPHOS 41 06/09/2020 0633   BILITOT 0.2 (L) 06/09/2020 0633   GFRNONAA >60 06/12/2020 0520   GFRAA >60 06/12/2020 0520   Lipase      Component Value Date/Time   LIPASE 31 05/31/2020 2038       Studies/Results: No results found.  Anti-infectives: Anti-infectives (From admission, onward)   Start     Dose/Rate Route Frequency Ordered Stop   06/07/20 1200  vancomycin (VANCOCIN) 50 mg/mL oral solution 125 mg     Discontinue     125 mg Oral Every 6 hours 06/07/20 1036     06/06/20 1615  metroNIDAZOLE (FLAGYL) IVPB 500 mg  Status:  Discontinued        500 mg 100 mL/hr over 60 Minutes Intravenous Every 8 hours 06/06/20 1605 06/07/20 1036   06/06/20 1400  piperacillin-tazobactam (ZOSYN) IVPB 3.375 g        3.375 g 100 mL/hr over 30 Minutes Intravenous Every 8 hours 06/06/20 1324 06/11/20 0628   06/06/20 0945  polymyxin B 500,000 Units, bacitracin 50,000 Units in sodium chloride 0.9 % 500 mL irrigation  Status:  Discontinued          As needed 06/06/20 0945 06/06/20 1242   06/06/20 0800  cefoTEtan (CEFOTAN) 2 g in sodium chloride 0.9 % 100 mL IVPB        2 g 200 mL/hr over 30 Minutes Intravenous On call to O.R. 06/05/20 0831 06/06/20 1030   06/02/20 1030  vancomycin (VANCOCIN) 50 mg/mL oral solution 125 mg  Status:  Discontinued        125 mg Oral 3 times daily before meals & bedtime 06/02/20 0822 06/06/20 1605   06/01/20 1130  metroNIDAZOLE (FLAGYL) tablet 500 mg  Status:  Discontinued        500 mg Oral 3 times daily 06/01/20 1124 06/02/20 2376       Assessment/Plan PAD chronic diastolic heart failure CKD stage II HTN T2DM HLD Chronic back pain Depression Hxaortobifem bypasson 04/18/2020 by Dr. Orland Jarred colitis -restarted on oral vancomycin, TRH discussing length of therapy with ID Malnutrition - prealbumin 11.3 (8/9)  Descending/sigmoid colonic stricture due to ischemic colitis. Contained perforation of the rectosigmoid junction -S/pExploratory laparotomy, LOA,Extended left colectomy with end colostomy (Hartmann's procedure)by Dr. Redmond Pulling, 06/06/2020 - POD #8 - surgical path:severely active  chronic colitis -completed 5 days zosyn.Discussed with dr. Tommy Medal and Pharmacy post op for recommendations  - Wound vac, change M/W/F - toleratingdietbut not eating much,ostomy functioning  FEN -soft diet VTE -SCds, subqheparin ID -C. Diff tx per TRH. IV Zosyn 8/5 >>8/10 Foley -out Follow up - Dr. Redmond Pulling  Plan:Continue soft diet. Continue therapies, mobilization. [place] for discharge to SNF when bed available. She will go with abdominal vac. Discharge instructions and follow up info on AVS.    LOS: 13 days    Wellington Hampshire, [facility] 06/14/2020, 8:14 AM Please see Amion for pager number during day hours 7:00am-4:30pm

## 2020-06-14 NOTE — Plan of Care (Signed)
  Problem: Education: Goal: Ability to demonstrate management of disease process will improve Outcome: Adequate for Discharge Goal: Ability to verbalize understanding of medication therapies will improve Outcome: Adequate for Discharge   Problem: Activity: Goal: Capacity to carry out activities will improve Outcome: Adequate for Discharge   Problem: Education: Goal: Knowledge of General Education information will improve Description: Including pain rating scale, medication(s)/side effects and non-pharmacologic comfort measures Outcome: Adequate for Discharge   Problem: Health Behavior/Discharge Planning: Goal: Ability to manage health-related needs will improve Outcome: Adequate for Discharge   Problem: Activity: Goal: Risk for activity intolerance will decrease Outcome: Adequate for Discharge   Problem: Nutrition: Goal: Adequate nutrition will be maintained Outcome: Adequate for Discharge   Problem: Skin Integrity: Goal: Risk for impaired skin integrity will decrease Outcome: Adequate for Discharge   Problem: Education: Goal: Ability to demonstrate management of disease process will improve Outcome: Adequate for Discharge Goal: Ability to verbalize understanding of medication therapies will improve Outcome: Adequate for Discharge   Problem: Activity: Goal: Capacity to carry out activities will improve Outcome: Adequate for Discharge   Problem: Cardiac: Goal: Ability to achieve and maintain adequate cardiopulmonary perfusion will improve Outcome: Completed/Met   Problem: Clinical Measurements: Goal: Ability to maintain clinical measurements within normal limits will improve Outcome: Completed/Met Goal: Will remain free from infection Outcome: Completed/Met Goal: Diagnostic test results will improve Outcome: Completed/Met Goal: Respiratory complications will improve Outcome: Completed/Met Goal: Cardiovascular complication will be avoided Outcome: Completed/Met    Problem: Coping: Goal: Level of anxiety will decrease Outcome: Completed/Met   Problem: Elimination: Goal: Will not experience complications related to bowel motility Outcome: Completed/Met   Problem: Safety: Goal: Ability to remain free from injury will improve Outcome: Completed/Met   Problem: Cardiac: Goal: Ability to achieve and maintain adequate cardiopulmonary perfusion will improve Outcome: Completed/Met   Problem: Education: Goal: Individualized Educational Video(s) Outcome: Not Applicable   Problem: Education: Goal: Individualized Educational Video(s) Outcome: Not Applicable   Problem: Education: Goal: Required Educational Video(s) Outcome: Not Applicable   Problem: Clinical Measurements: Goal: Ability to maintain clinical measurements within normal limits will improve Outcome: Not Applicable Goal: Postoperative complications will be avoided or minimized Outcome: Not Applicable   Problem: Skin Integrity: Goal: Demonstration of wound healing without infection will improve Outcome: Not Applicable

## 2020-06-14 NOTE — TOC Transition Note (Addendum)
Transition of Care [facility]) - CM/SW Discharge Note   Patient Details  Name: Tonya Hughes MRN: [id number] Date of Birth: July 08, 1951  [facility] CM/SW Contact:  Trula Ore, [place] Phone Number: 06/14/2020, 11:56 AM   Clinical Narrative:     Patient will DC to: [facility]  Anticipated DC date: 06/14/2020  Family notified: Carlos American  Transport by: Corey Harold  ?  Per MD patient ready for DC to [facility] . RN, patient, patient's family, and facility notified of DC. Discharge Summary sent to facility. RN given number for report tele#[phone] RM#[phone]. DC packet on chart. Ambulance transport requested for patient.  CSW signing off.  Final next level of care: Skilled Nursing Facility Barriers to Discharge: No Barriers Identified   Patient Goals and CMS Choice Patient states their goals for this hospitalization and ongoing recovery are:: to go to SNF CMS Medicare.gov Compare Post Acute Care list provided to:: Patient Represenative (must comment) Carlos American) Choice offered to / list presented to : Spouse Carlos American)  Discharge Placement              Patient chooses bed at: [facility] Patient to be transferred to facility by: [place] Name of family member notified: Carlos American Patient and family notified of of transfer: 06/14/20  Discharge Plan and Services                                     Social Determinants of Health (SDOH) Interventions     Readmission Risk Interventions Readmission Risk Prevention Plan 06/07/2020  Social Work Consult for [place] Planning/Counseling [employer] Screening Not Applicable  Some recent data might be hidden

## 2020-06-16 DIAGNOSIS — A0472 Enterocolitis due to Clostridium difficile, not specified as recurrent: Secondary | ICD-10-CM | POA: Diagnosis not present

## 2020-06-16 DIAGNOSIS — I10 Essential (primary) hypertension: Secondary | ICD-10-CM | POA: Diagnosis not present

## 2020-06-16 DIAGNOSIS — N179 Acute kidney failure, unspecified: Secondary | ICD-10-CM | POA: Diagnosis not present

## 2020-06-16 DIAGNOSIS — E1151 Type 2 diabetes mellitus with diabetic peripheral angiopathy without gangrene: Secondary | ICD-10-CM | POA: Diagnosis not present

## 2020-06-16 DIAGNOSIS — E871 Hypo-osmolality and hyponatremia: Secondary | ICD-10-CM | POA: Diagnosis not present

## 2020-06-19 DIAGNOSIS — N179 Acute kidney failure, unspecified: Secondary | ICD-10-CM | POA: Diagnosis not present

## 2020-06-19 DIAGNOSIS — K55039 Acute (reversible) ischemia of large intestine, extent unspecified: Secondary | ICD-10-CM | POA: Diagnosis not present

## 2020-06-19 DIAGNOSIS — J449 Chronic obstructive pulmonary disease, unspecified: Secondary | ICD-10-CM | POA: Diagnosis not present

## 2020-06-19 DIAGNOSIS — I10 Essential (primary) hypertension: Secondary | ICD-10-CM | POA: Diagnosis not present

## 2020-06-19 DIAGNOSIS — E1151 Type 2 diabetes mellitus with diabetic peripheral angiopathy without gangrene: Secondary | ICD-10-CM | POA: Diagnosis not present

## 2020-06-28 DIAGNOSIS — N179 Acute kidney failure, unspecified: Secondary | ICD-10-CM | POA: Diagnosis not present

## 2020-06-28 DIAGNOSIS — E1151 Type 2 diabetes mellitus with diabetic peripheral angiopathy without gangrene: Secondary | ICD-10-CM | POA: Diagnosis not present

## 2020-06-28 DIAGNOSIS — E871 Hypo-osmolality and hyponatremia: Secondary | ICD-10-CM | POA: Diagnosis not present

## 2020-07-03 DIAGNOSIS — N179 Acute kidney failure, unspecified: Secondary | ICD-10-CM | POA: Diagnosis not present

## 2020-07-03 DIAGNOSIS — E1151 Type 2 diabetes mellitus with diabetic peripheral angiopathy without gangrene: Secondary | ICD-10-CM | POA: Diagnosis not present

## 2020-07-03 DIAGNOSIS — E871 Hypo-osmolality and hyponatremia: Secondary | ICD-10-CM | POA: Diagnosis not present

## 2020-07-03 DIAGNOSIS — K55039 Acute (reversible) ischemia of large intestine, extent unspecified: Secondary | ICD-10-CM | POA: Diagnosis not present

## 2020-07-10 DIAGNOSIS — N179 Acute kidney failure, unspecified: Secondary | ICD-10-CM | POA: Diagnosis not present

## 2020-07-10 DIAGNOSIS — E871 Hypo-osmolality and hyponatremia: Secondary | ICD-10-CM | POA: Diagnosis not present

## 2020-07-10 DIAGNOSIS — E1151 Type 2 diabetes mellitus with diabetic peripheral angiopathy without gangrene: Secondary | ICD-10-CM | POA: Diagnosis not present

## 2020-07-14 DIAGNOSIS — Z48815 Encounter for surgical aftercare following surgery on the digestive system: Secondary | ICD-10-CM | POA: Diagnosis not present

## 2020-07-14 DIAGNOSIS — Z4801 Encounter for change or removal of surgical wound dressing: Secondary | ICD-10-CM | POA: Diagnosis not present

## 2020-07-14 DIAGNOSIS — J449 Chronic obstructive pulmonary disease, unspecified: Secondary | ICD-10-CM | POA: Diagnosis not present

## 2020-07-14 DIAGNOSIS — Z8619 Personal history of other infectious and parasitic diseases: Secondary | ICD-10-CM | POA: Diagnosis not present

## 2020-07-14 DIAGNOSIS — E871 Hypo-osmolality and hyponatremia: Secondary | ICD-10-CM | POA: Diagnosis not present

## 2020-07-14 DIAGNOSIS — I110 Hypertensive heart disease with heart failure: Secondary | ICD-10-CM | POA: Diagnosis not present

## 2020-07-14 DIAGNOSIS — E876 Hypokalemia: Secondary | ICD-10-CM | POA: Diagnosis not present

## 2020-07-14 DIAGNOSIS — Z433 Encounter for attention to colostomy: Secondary | ICD-10-CM | POA: Diagnosis not present

## 2020-07-14 DIAGNOSIS — E440 Moderate protein-calorie malnutrition: Secondary | ICD-10-CM | POA: Diagnosis not present

## 2020-07-14 DIAGNOSIS — I5032 Chronic diastolic (congestive) heart failure: Secondary | ICD-10-CM | POA: Diagnosis not present

## 2020-07-14 DIAGNOSIS — Z9049 Acquired absence of other specified parts of digestive tract: Secondary | ICD-10-CM | POA: Diagnosis not present

## 2020-07-14 DIAGNOSIS — N179 Acute kidney failure, unspecified: Secondary | ICD-10-CM | POA: Diagnosis not present

## 2020-07-14 DIAGNOSIS — E1151 Type 2 diabetes mellitus with diabetic peripheral angiopathy without gangrene: Secondary | ICD-10-CM | POA: Diagnosis not present

## 2020-07-15 DIAGNOSIS — Z433 Encounter for attention to colostomy: Secondary | ICD-10-CM | POA: Diagnosis not present

## 2020-07-15 DIAGNOSIS — J449 Chronic obstructive pulmonary disease, unspecified: Secondary | ICD-10-CM | POA: Diagnosis not present

## 2020-07-15 DIAGNOSIS — Z9049 Acquired absence of other specified parts of digestive tract: Secondary | ICD-10-CM | POA: Diagnosis not present

## 2020-07-15 DIAGNOSIS — N179 Acute kidney failure, unspecified: Secondary | ICD-10-CM | POA: Diagnosis not present

## 2020-07-15 DIAGNOSIS — E1151 Type 2 diabetes mellitus with diabetic peripheral angiopathy without gangrene: Secondary | ICD-10-CM | POA: Diagnosis not present

## 2020-07-15 DIAGNOSIS — Z8619 Personal history of other infectious and parasitic diseases: Secondary | ICD-10-CM | POA: Diagnosis not present

## 2020-07-15 DIAGNOSIS — Z4801 Encounter for change or removal of surgical wound dressing: Secondary | ICD-10-CM | POA: Diagnosis not present

## 2020-07-15 DIAGNOSIS — E440 Moderate protein-calorie malnutrition: Secondary | ICD-10-CM | POA: Diagnosis not present

## 2020-07-15 DIAGNOSIS — E876 Hypokalemia: Secondary | ICD-10-CM | POA: Diagnosis not present

## 2020-07-15 DIAGNOSIS — Z48815 Encounter for surgical aftercare following surgery on the digestive system: Secondary | ICD-10-CM | POA: Diagnosis not present

## 2020-07-15 DIAGNOSIS — I110 Hypertensive heart disease with heart failure: Secondary | ICD-10-CM | POA: Diagnosis not present

## 2020-07-15 DIAGNOSIS — I5032 Chronic diastolic (congestive) heart failure: Secondary | ICD-10-CM | POA: Diagnosis not present

## 2020-07-15 DIAGNOSIS — E871 Hypo-osmolality and hyponatremia: Secondary | ICD-10-CM | POA: Diagnosis not present

## 2020-07-16 DIAGNOSIS — E1151 Type 2 diabetes mellitus with diabetic peripheral angiopathy without gangrene: Secondary | ICD-10-CM | POA: Diagnosis not present

## 2020-07-16 DIAGNOSIS — Z4801 Encounter for change or removal of surgical wound dressing: Secondary | ICD-10-CM | POA: Diagnosis not present

## 2020-07-16 DIAGNOSIS — I5032 Chronic diastolic (congestive) heart failure: Secondary | ICD-10-CM | POA: Diagnosis not present

## 2020-07-16 DIAGNOSIS — N179 Acute kidney failure, unspecified: Secondary | ICD-10-CM | POA: Diagnosis not present

## 2020-07-16 DIAGNOSIS — Z8619 Personal history of other infectious and parasitic diseases: Secondary | ICD-10-CM | POA: Diagnosis not present

## 2020-07-16 DIAGNOSIS — I110 Hypertensive heart disease with heart failure: Secondary | ICD-10-CM | POA: Diagnosis not present

## 2020-07-16 DIAGNOSIS — E876 Hypokalemia: Secondary | ICD-10-CM | POA: Diagnosis not present

## 2020-07-16 DIAGNOSIS — Z433 Encounter for attention to colostomy: Secondary | ICD-10-CM | POA: Diagnosis not present

## 2020-07-16 DIAGNOSIS — Z933 Colostomy status: Secondary | ICD-10-CM | POA: Diagnosis not present

## 2020-07-16 DIAGNOSIS — J449 Chronic obstructive pulmonary disease, unspecified: Secondary | ICD-10-CM | POA: Diagnosis not present

## 2020-07-16 DIAGNOSIS — E440 Moderate protein-calorie malnutrition: Secondary | ICD-10-CM | POA: Diagnosis not present

## 2020-07-16 DIAGNOSIS — Z48815 Encounter for surgical aftercare following surgery on the digestive system: Secondary | ICD-10-CM | POA: Diagnosis not present

## 2020-07-16 DIAGNOSIS — Z9049 Acquired absence of other specified parts of digestive tract: Secondary | ICD-10-CM | POA: Diagnosis not present

## 2020-07-16 DIAGNOSIS — E871 Hypo-osmolality and hyponatremia: Secondary | ICD-10-CM | POA: Diagnosis not present

## 2020-07-17 DIAGNOSIS — E1151 Type 2 diabetes mellitus with diabetic peripheral angiopathy without gangrene: Secondary | ICD-10-CM | POA: Diagnosis not present

## 2020-07-17 DIAGNOSIS — Z48815 Encounter for surgical aftercare following surgery on the digestive system: Secondary | ICD-10-CM | POA: Diagnosis not present

## 2020-07-17 DIAGNOSIS — E440 Moderate protein-calorie malnutrition: Secondary | ICD-10-CM | POA: Diagnosis not present

## 2020-07-17 DIAGNOSIS — Z8619 Personal history of other infectious and parasitic diseases: Secondary | ICD-10-CM | POA: Diagnosis not present

## 2020-07-17 DIAGNOSIS — I5032 Chronic diastolic (congestive) heart failure: Secondary | ICD-10-CM | POA: Diagnosis not present

## 2020-07-17 DIAGNOSIS — N179 Acute kidney failure, unspecified: Secondary | ICD-10-CM | POA: Diagnosis not present

## 2020-07-17 DIAGNOSIS — E871 Hypo-osmolality and hyponatremia: Secondary | ICD-10-CM | POA: Diagnosis not present

## 2020-07-17 DIAGNOSIS — Z433 Encounter for attention to colostomy: Secondary | ICD-10-CM | POA: Diagnosis not present

## 2020-07-17 DIAGNOSIS — E876 Hypokalemia: Secondary | ICD-10-CM | POA: Diagnosis not present

## 2020-07-17 DIAGNOSIS — J449 Chronic obstructive pulmonary disease, unspecified: Secondary | ICD-10-CM | POA: Diagnosis not present

## 2020-07-17 DIAGNOSIS — I110 Hypertensive heart disease with heart failure: Secondary | ICD-10-CM | POA: Diagnosis not present

## 2020-07-17 DIAGNOSIS — Z4801 Encounter for change or removal of surgical wound dressing: Secondary | ICD-10-CM | POA: Diagnosis not present

## 2020-07-17 DIAGNOSIS — Z9049 Acquired absence of other specified parts of digestive tract: Secondary | ICD-10-CM | POA: Diagnosis not present

## 2020-07-18 DIAGNOSIS — I5032 Chronic diastolic (congestive) heart failure: Secondary | ICD-10-CM | POA: Diagnosis not present

## 2020-07-18 DIAGNOSIS — E871 Hypo-osmolality and hyponatremia: Secondary | ICD-10-CM | POA: Diagnosis not present

## 2020-07-18 DIAGNOSIS — Z9049 Acquired absence of other specified parts of digestive tract: Secondary | ICD-10-CM | POA: Diagnosis not present

## 2020-07-18 DIAGNOSIS — N179 Acute kidney failure, unspecified: Secondary | ICD-10-CM | POA: Diagnosis not present

## 2020-07-18 DIAGNOSIS — E1151 Type 2 diabetes mellitus with diabetic peripheral angiopathy without gangrene: Secondary | ICD-10-CM | POA: Diagnosis not present

## 2020-07-18 DIAGNOSIS — Z48815 Encounter for surgical aftercare following surgery on the digestive system: Secondary | ICD-10-CM | POA: Diagnosis not present

## 2020-07-18 DIAGNOSIS — J449 Chronic obstructive pulmonary disease, unspecified: Secondary | ICD-10-CM | POA: Diagnosis not present

## 2020-07-18 DIAGNOSIS — Z8619 Personal history of other infectious and parasitic diseases: Secondary | ICD-10-CM | POA: Diagnosis not present

## 2020-07-18 DIAGNOSIS — Z4801 Encounter for change or removal of surgical wound dressing: Secondary | ICD-10-CM | POA: Diagnosis not present

## 2020-07-18 DIAGNOSIS — I110 Hypertensive heart disease with heart failure: Secondary | ICD-10-CM | POA: Diagnosis not present

## 2020-07-18 DIAGNOSIS — E876 Hypokalemia: Secondary | ICD-10-CM | POA: Diagnosis not present

## 2020-07-18 DIAGNOSIS — E440 Moderate protein-calorie malnutrition: Secondary | ICD-10-CM | POA: Diagnosis not present

## 2020-07-18 DIAGNOSIS — Z433 Encounter for attention to colostomy: Secondary | ICD-10-CM | POA: Diagnosis not present

## 2020-07-20 DIAGNOSIS — Z433 Encounter for attention to colostomy: Secondary | ICD-10-CM | POA: Diagnosis not present

## 2020-07-20 DIAGNOSIS — E871 Hypo-osmolality and hyponatremia: Secondary | ICD-10-CM | POA: Diagnosis not present

## 2020-07-20 DIAGNOSIS — J449 Chronic obstructive pulmonary disease, unspecified: Secondary | ICD-10-CM | POA: Diagnosis not present

## 2020-07-20 DIAGNOSIS — Z4801 Encounter for change or removal of surgical wound dressing: Secondary | ICD-10-CM | POA: Diagnosis not present

## 2020-07-20 DIAGNOSIS — Z8619 Personal history of other infectious and parasitic diseases: Secondary | ICD-10-CM | POA: Diagnosis not present

## 2020-07-20 DIAGNOSIS — E440 Moderate protein-calorie malnutrition: Secondary | ICD-10-CM | POA: Diagnosis not present

## 2020-07-20 DIAGNOSIS — I110 Hypertensive heart disease with heart failure: Secondary | ICD-10-CM | POA: Diagnosis not present

## 2020-07-20 DIAGNOSIS — Z9049 Acquired absence of other specified parts of digestive tract: Secondary | ICD-10-CM | POA: Diagnosis not present

## 2020-07-20 DIAGNOSIS — E1151 Type 2 diabetes mellitus with diabetic peripheral angiopathy without gangrene: Secondary | ICD-10-CM | POA: Diagnosis not present

## 2020-07-20 DIAGNOSIS — E876 Hypokalemia: Secondary | ICD-10-CM | POA: Diagnosis not present

## 2020-07-20 DIAGNOSIS — I5032 Chronic diastolic (congestive) heart failure: Secondary | ICD-10-CM | POA: Diagnosis not present

## 2020-07-20 DIAGNOSIS — N179 Acute kidney failure, unspecified: Secondary | ICD-10-CM | POA: Diagnosis not present

## 2020-07-20 DIAGNOSIS — Z48815 Encounter for surgical aftercare following surgery on the digestive system: Secondary | ICD-10-CM | POA: Diagnosis not present

## 2020-07-22 DIAGNOSIS — E871 Hypo-osmolality and hyponatremia: Secondary | ICD-10-CM | POA: Diagnosis not present

## 2020-07-22 DIAGNOSIS — Z9049 Acquired absence of other specified parts of digestive tract: Secondary | ICD-10-CM | POA: Diagnosis not present

## 2020-07-22 DIAGNOSIS — I110 Hypertensive heart disease with heart failure: Secondary | ICD-10-CM | POA: Diagnosis not present

## 2020-07-22 DIAGNOSIS — E876 Hypokalemia: Secondary | ICD-10-CM | POA: Diagnosis not present

## 2020-07-22 DIAGNOSIS — J449 Chronic obstructive pulmonary disease, unspecified: Secondary | ICD-10-CM | POA: Diagnosis not present

## 2020-07-22 DIAGNOSIS — N179 Acute kidney failure, unspecified: Secondary | ICD-10-CM | POA: Diagnosis not present

## 2020-07-22 DIAGNOSIS — Z8619 Personal history of other infectious and parasitic diseases: Secondary | ICD-10-CM | POA: Diagnosis not present

## 2020-07-22 DIAGNOSIS — E440 Moderate protein-calorie malnutrition: Secondary | ICD-10-CM | POA: Diagnosis not present

## 2020-07-22 DIAGNOSIS — E1151 Type 2 diabetes mellitus with diabetic peripheral angiopathy without gangrene: Secondary | ICD-10-CM | POA: Diagnosis not present

## 2020-07-22 DIAGNOSIS — I5032 Chronic diastolic (congestive) heart failure: Secondary | ICD-10-CM | POA: Diagnosis not present

## 2020-07-22 DIAGNOSIS — Z433 Encounter for attention to colostomy: Secondary | ICD-10-CM | POA: Diagnosis not present

## 2020-07-22 DIAGNOSIS — Z4801 Encounter for change or removal of surgical wound dressing: Secondary | ICD-10-CM | POA: Diagnosis not present

## 2020-07-22 DIAGNOSIS — Z48815 Encounter for surgical aftercare following surgery on the digestive system: Secondary | ICD-10-CM | POA: Diagnosis not present

## 2020-07-23 DIAGNOSIS — I5032 Chronic diastolic (congestive) heart failure: Secondary | ICD-10-CM | POA: Diagnosis not present

## 2020-07-23 DIAGNOSIS — I110 Hypertensive heart disease with heart failure: Secondary | ICD-10-CM | POA: Diagnosis not present

## 2020-07-23 DIAGNOSIS — J449 Chronic obstructive pulmonary disease, unspecified: Secondary | ICD-10-CM | POA: Diagnosis not present

## 2020-07-23 DIAGNOSIS — E1151 Type 2 diabetes mellitus with diabetic peripheral angiopathy without gangrene: Secondary | ICD-10-CM | POA: Diagnosis not present

## 2020-07-23 DIAGNOSIS — Z48815 Encounter for surgical aftercare following surgery on the digestive system: Secondary | ICD-10-CM | POA: Diagnosis not present

## 2020-07-23 DIAGNOSIS — Z4801 Encounter for change or removal of surgical wound dressing: Secondary | ICD-10-CM | POA: Diagnosis not present

## 2020-07-23 DIAGNOSIS — Z9049 Acquired absence of other specified parts of digestive tract: Secondary | ICD-10-CM | POA: Diagnosis not present

## 2020-07-23 DIAGNOSIS — N179 Acute kidney failure, unspecified: Secondary | ICD-10-CM | POA: Diagnosis not present

## 2020-07-23 DIAGNOSIS — Z8619 Personal history of other infectious and parasitic diseases: Secondary | ICD-10-CM | POA: Diagnosis not present

## 2020-07-23 DIAGNOSIS — E876 Hypokalemia: Secondary | ICD-10-CM | POA: Diagnosis not present

## 2020-07-23 DIAGNOSIS — E440 Moderate protein-calorie malnutrition: Secondary | ICD-10-CM | POA: Diagnosis not present

## 2020-07-23 DIAGNOSIS — Z433 Encounter for attention to colostomy: Secondary | ICD-10-CM | POA: Diagnosis not present

## 2020-07-23 DIAGNOSIS — E871 Hypo-osmolality and hyponatremia: Secondary | ICD-10-CM | POA: Diagnosis not present

## 2020-07-26 DIAGNOSIS — Z4801 Encounter for change or removal of surgical wound dressing: Secondary | ICD-10-CM | POA: Diagnosis not present

## 2020-07-26 DIAGNOSIS — E440 Moderate protein-calorie malnutrition: Secondary | ICD-10-CM | POA: Diagnosis not present

## 2020-07-26 DIAGNOSIS — Z9049 Acquired absence of other specified parts of digestive tract: Secondary | ICD-10-CM | POA: Diagnosis not present

## 2020-07-26 DIAGNOSIS — E871 Hypo-osmolality and hyponatremia: Secondary | ICD-10-CM | POA: Diagnosis not present

## 2020-07-26 DIAGNOSIS — E1151 Type 2 diabetes mellitus with diabetic peripheral angiopathy without gangrene: Secondary | ICD-10-CM | POA: Diagnosis not present

## 2020-07-26 DIAGNOSIS — Z8619 Personal history of other infectious and parasitic diseases: Secondary | ICD-10-CM | POA: Diagnosis not present

## 2020-07-26 DIAGNOSIS — Z433 Encounter for attention to colostomy: Secondary | ICD-10-CM | POA: Diagnosis not present

## 2020-07-26 DIAGNOSIS — N179 Acute kidney failure, unspecified: Secondary | ICD-10-CM | POA: Diagnosis not present

## 2020-07-26 DIAGNOSIS — J449 Chronic obstructive pulmonary disease, unspecified: Secondary | ICD-10-CM | POA: Diagnosis not present

## 2020-07-26 DIAGNOSIS — E876 Hypokalemia: Secondary | ICD-10-CM | POA: Diagnosis not present

## 2020-07-26 DIAGNOSIS — I110 Hypertensive heart disease with heart failure: Secondary | ICD-10-CM | POA: Diagnosis not present

## 2020-07-26 DIAGNOSIS — Z48815 Encounter for surgical aftercare following surgery on the digestive system: Secondary | ICD-10-CM | POA: Diagnosis not present

## 2020-07-26 DIAGNOSIS — I5032 Chronic diastolic (congestive) heart failure: Secondary | ICD-10-CM | POA: Diagnosis not present

## 2020-07-30 DIAGNOSIS — Z8619 Personal history of other infectious and parasitic diseases: Secondary | ICD-10-CM | POA: Diagnosis not present

## 2020-07-30 DIAGNOSIS — Z4801 Encounter for change or removal of surgical wound dressing: Secondary | ICD-10-CM | POA: Diagnosis not present

## 2020-07-30 DIAGNOSIS — E1151 Type 2 diabetes mellitus with diabetic peripheral angiopathy without gangrene: Secondary | ICD-10-CM | POA: Diagnosis not present

## 2020-07-30 DIAGNOSIS — E876 Hypokalemia: Secondary | ICD-10-CM | POA: Diagnosis not present

## 2020-07-30 DIAGNOSIS — Z48815 Encounter for surgical aftercare following surgery on the digestive system: Secondary | ICD-10-CM | POA: Diagnosis not present

## 2020-07-30 DIAGNOSIS — E871 Hypo-osmolality and hyponatremia: Secondary | ICD-10-CM | POA: Diagnosis not present

## 2020-07-30 DIAGNOSIS — Z9049 Acquired absence of other specified parts of digestive tract: Secondary | ICD-10-CM | POA: Diagnosis not present

## 2020-07-30 DIAGNOSIS — I5032 Chronic diastolic (congestive) heart failure: Secondary | ICD-10-CM | POA: Diagnosis not present

## 2020-07-30 DIAGNOSIS — J449 Chronic obstructive pulmonary disease, unspecified: Secondary | ICD-10-CM | POA: Diagnosis not present

## 2020-07-30 DIAGNOSIS — I110 Hypertensive heart disease with heart failure: Secondary | ICD-10-CM | POA: Diagnosis not present

## 2020-07-30 DIAGNOSIS — E440 Moderate protein-calorie malnutrition: Secondary | ICD-10-CM | POA: Diagnosis not present

## 2020-07-30 DIAGNOSIS — N179 Acute kidney failure, unspecified: Secondary | ICD-10-CM | POA: Diagnosis not present

## 2020-07-30 DIAGNOSIS — Z433 Encounter for attention to colostomy: Secondary | ICD-10-CM | POA: Diagnosis not present

## 2020-08-01 DIAGNOSIS — E876 Hypokalemia: Secondary | ICD-10-CM | POA: Diagnosis not present

## 2020-08-01 DIAGNOSIS — J449 Chronic obstructive pulmonary disease, unspecified: Secondary | ICD-10-CM | POA: Diagnosis not present

## 2020-08-01 DIAGNOSIS — Z48815 Encounter for surgical aftercare following surgery on the digestive system: Secondary | ICD-10-CM | POA: Diagnosis not present

## 2020-08-01 DIAGNOSIS — Z9049 Acquired absence of other specified parts of digestive tract: Secondary | ICD-10-CM | POA: Diagnosis not present

## 2020-08-01 DIAGNOSIS — E871 Hypo-osmolality and hyponatremia: Secondary | ICD-10-CM | POA: Diagnosis not present

## 2020-08-01 DIAGNOSIS — Z433 Encounter for attention to colostomy: Secondary | ICD-10-CM | POA: Diagnosis not present

## 2020-08-01 DIAGNOSIS — I5032 Chronic diastolic (congestive) heart failure: Secondary | ICD-10-CM | POA: Diagnosis not present

## 2020-08-01 DIAGNOSIS — Z8619 Personal history of other infectious and parasitic diseases: Secondary | ICD-10-CM | POA: Diagnosis not present

## 2020-08-01 DIAGNOSIS — E1151 Type 2 diabetes mellitus with diabetic peripheral angiopathy without gangrene: Secondary | ICD-10-CM | POA: Diagnosis not present

## 2020-08-01 DIAGNOSIS — I110 Hypertensive heart disease with heart failure: Secondary | ICD-10-CM | POA: Diagnosis not present

## 2020-08-01 DIAGNOSIS — Z4801 Encounter for change or removal of surgical wound dressing: Secondary | ICD-10-CM | POA: Diagnosis not present

## 2020-08-01 DIAGNOSIS — N179 Acute kidney failure, unspecified: Secondary | ICD-10-CM | POA: Diagnosis not present

## 2020-08-01 DIAGNOSIS — E440 Moderate protein-calorie malnutrition: Secondary | ICD-10-CM | POA: Diagnosis not present

## 2020-08-05 DIAGNOSIS — M21372 Foot drop, left foot: Secondary | ICD-10-CM | POA: Diagnosis not present

## 2020-08-05 DIAGNOSIS — Z9889 Other specified postprocedural states: Secondary | ICD-10-CM | POA: Diagnosis not present

## 2020-08-05 DIAGNOSIS — I82409 Acute embolism and thrombosis of unspecified deep veins of unspecified lower extremity: Secondary | ICD-10-CM | POA: Diagnosis not present

## 2020-08-05 DIAGNOSIS — I509 Heart failure, unspecified: Secondary | ICD-10-CM | POA: Diagnosis not present

## 2020-08-05 DIAGNOSIS — F17210 Nicotine dependence, cigarettes, uncomplicated: Secondary | ICD-10-CM | POA: Diagnosis not present

## 2020-08-05 DIAGNOSIS — Z933 Colostomy status: Secondary | ICD-10-CM | POA: Diagnosis not present

## 2020-08-05 DIAGNOSIS — Z299 Encounter for prophylactic measures, unspecified: Secondary | ICD-10-CM | POA: Diagnosis not present

## 2020-08-06 DIAGNOSIS — E876 Hypokalemia: Secondary | ICD-10-CM | POA: Diagnosis not present

## 2020-08-06 DIAGNOSIS — Z9049 Acquired absence of other specified parts of digestive tract: Secondary | ICD-10-CM | POA: Diagnosis not present

## 2020-08-06 DIAGNOSIS — E1151 Type 2 diabetes mellitus with diabetic peripheral angiopathy without gangrene: Secondary | ICD-10-CM | POA: Diagnosis not present

## 2020-08-06 DIAGNOSIS — Z8619 Personal history of other infectious and parasitic diseases: Secondary | ICD-10-CM | POA: Diagnosis not present

## 2020-08-06 DIAGNOSIS — J449 Chronic obstructive pulmonary disease, unspecified: Secondary | ICD-10-CM | POA: Diagnosis not present

## 2020-08-06 DIAGNOSIS — Z4801 Encounter for change or removal of surgical wound dressing: Secondary | ICD-10-CM | POA: Diagnosis not present

## 2020-08-06 DIAGNOSIS — Z48815 Encounter for surgical aftercare following surgery on the digestive system: Secondary | ICD-10-CM | POA: Diagnosis not present

## 2020-08-06 DIAGNOSIS — E871 Hypo-osmolality and hyponatremia: Secondary | ICD-10-CM | POA: Diagnosis not present

## 2020-08-06 DIAGNOSIS — E440 Moderate protein-calorie malnutrition: Secondary | ICD-10-CM | POA: Diagnosis not present

## 2020-08-06 DIAGNOSIS — I110 Hypertensive heart disease with heart failure: Secondary | ICD-10-CM | POA: Diagnosis not present

## 2020-08-06 DIAGNOSIS — Z433 Encounter for attention to colostomy: Secondary | ICD-10-CM | POA: Diagnosis not present

## 2020-08-06 DIAGNOSIS — N179 Acute kidney failure, unspecified: Secondary | ICD-10-CM | POA: Diagnosis not present

## 2020-08-06 DIAGNOSIS — I5032 Chronic diastolic (congestive) heart failure: Secondary | ICD-10-CM | POA: Diagnosis not present

## 2020-08-08 DIAGNOSIS — Z48815 Encounter for surgical aftercare following surgery on the digestive system: Secondary | ICD-10-CM | POA: Diagnosis not present

## 2020-08-08 DIAGNOSIS — Z8619 Personal history of other infectious and parasitic diseases: Secondary | ICD-10-CM | POA: Diagnosis not present

## 2020-08-08 DIAGNOSIS — I5032 Chronic diastolic (congestive) heart failure: Secondary | ICD-10-CM | POA: Diagnosis not present

## 2020-08-08 DIAGNOSIS — E1151 Type 2 diabetes mellitus with diabetic peripheral angiopathy without gangrene: Secondary | ICD-10-CM | POA: Diagnosis not present

## 2020-08-08 DIAGNOSIS — E876 Hypokalemia: Secondary | ICD-10-CM | POA: Diagnosis not present

## 2020-08-08 DIAGNOSIS — Z4801 Encounter for change or removal of surgical wound dressing: Secondary | ICD-10-CM | POA: Diagnosis not present

## 2020-08-08 DIAGNOSIS — I110 Hypertensive heart disease with heart failure: Secondary | ICD-10-CM | POA: Diagnosis not present

## 2020-08-08 DIAGNOSIS — E871 Hypo-osmolality and hyponatremia: Secondary | ICD-10-CM | POA: Diagnosis not present

## 2020-08-08 DIAGNOSIS — E440 Moderate protein-calorie malnutrition: Secondary | ICD-10-CM | POA: Diagnosis not present

## 2020-08-08 DIAGNOSIS — Z433 Encounter for attention to colostomy: Secondary | ICD-10-CM | POA: Diagnosis not present

## 2020-08-08 DIAGNOSIS — Z9049 Acquired absence of other specified parts of digestive tract: Secondary | ICD-10-CM | POA: Diagnosis not present

## 2020-08-08 DIAGNOSIS — N179 Acute kidney failure, unspecified: Secondary | ICD-10-CM | POA: Diagnosis not present

## 2020-08-08 DIAGNOSIS — J449 Chronic obstructive pulmonary disease, unspecified: Secondary | ICD-10-CM | POA: Diagnosis not present

## 2020-08-13 DIAGNOSIS — Z4801 Encounter for change or removal of surgical wound dressing: Secondary | ICD-10-CM | POA: Diagnosis not present

## 2020-08-13 DIAGNOSIS — E440 Moderate protein-calorie malnutrition: Secondary | ICD-10-CM | POA: Diagnosis not present

## 2020-08-13 DIAGNOSIS — Z9049 Acquired absence of other specified parts of digestive tract: Secondary | ICD-10-CM | POA: Diagnosis not present

## 2020-08-13 DIAGNOSIS — N179 Acute kidney failure, unspecified: Secondary | ICD-10-CM | POA: Diagnosis not present

## 2020-08-13 DIAGNOSIS — I110 Hypertensive heart disease with heart failure: Secondary | ICD-10-CM | POA: Diagnosis not present

## 2020-08-13 DIAGNOSIS — E871 Hypo-osmolality and hyponatremia: Secondary | ICD-10-CM | POA: Diagnosis not present

## 2020-08-13 DIAGNOSIS — J449 Chronic obstructive pulmonary disease, unspecified: Secondary | ICD-10-CM | POA: Diagnosis not present

## 2020-08-13 DIAGNOSIS — Z8619 Personal history of other infectious and parasitic diseases: Secondary | ICD-10-CM | POA: Diagnosis not present

## 2020-08-13 DIAGNOSIS — Z48815 Encounter for surgical aftercare following surgery on the digestive system: Secondary | ICD-10-CM | POA: Diagnosis not present

## 2020-08-13 DIAGNOSIS — Z433 Encounter for attention to colostomy: Secondary | ICD-10-CM | POA: Diagnosis not present

## 2020-08-13 DIAGNOSIS — E1151 Type 2 diabetes mellitus with diabetic peripheral angiopathy without gangrene: Secondary | ICD-10-CM | POA: Diagnosis not present

## 2020-08-13 DIAGNOSIS — E876 Hypokalemia: Secondary | ICD-10-CM | POA: Diagnosis not present

## 2020-08-13 DIAGNOSIS — I5032 Chronic diastolic (congestive) heart failure: Secondary | ICD-10-CM | POA: Diagnosis not present

## 2020-08-14 DIAGNOSIS — Z433 Encounter for attention to colostomy: Secondary | ICD-10-CM | POA: Diagnosis not present

## 2020-08-21 DIAGNOSIS — Z933 Colostomy status: Secondary | ICD-10-CM | POA: Diagnosis not present

## 2020-08-26 DIAGNOSIS — Z8619 Personal history of other infectious and parasitic diseases: Secondary | ICD-10-CM | POA: Diagnosis not present

## 2020-08-26 DIAGNOSIS — J449 Chronic obstructive pulmonary disease, unspecified: Secondary | ICD-10-CM | POA: Diagnosis not present

## 2020-08-26 DIAGNOSIS — Z48815 Encounter for surgical aftercare following surgery on the digestive system: Secondary | ICD-10-CM | POA: Diagnosis not present

## 2020-08-26 DIAGNOSIS — N179 Acute kidney failure, unspecified: Secondary | ICD-10-CM | POA: Diagnosis not present

## 2020-08-26 DIAGNOSIS — Z433 Encounter for attention to colostomy: Secondary | ICD-10-CM | POA: Diagnosis not present

## 2020-08-26 DIAGNOSIS — E871 Hypo-osmolality and hyponatremia: Secondary | ICD-10-CM | POA: Diagnosis not present

## 2020-08-26 DIAGNOSIS — E876 Hypokalemia: Secondary | ICD-10-CM | POA: Diagnosis not present

## 2020-08-26 DIAGNOSIS — Z9049 Acquired absence of other specified parts of digestive tract: Secondary | ICD-10-CM | POA: Diagnosis not present

## 2020-08-26 DIAGNOSIS — Z4801 Encounter for change or removal of surgical wound dressing: Secondary | ICD-10-CM | POA: Diagnosis not present

## 2020-08-26 DIAGNOSIS — E1151 Type 2 diabetes mellitus with diabetic peripheral angiopathy without gangrene: Secondary | ICD-10-CM | POA: Diagnosis not present

## 2020-08-26 DIAGNOSIS — E440 Moderate protein-calorie malnutrition: Secondary | ICD-10-CM | POA: Diagnosis not present

## 2020-08-26 DIAGNOSIS — I110 Hypertensive heart disease with heart failure: Secondary | ICD-10-CM | POA: Diagnosis not present

## 2020-08-26 DIAGNOSIS — I5032 Chronic diastolic (congestive) heart failure: Secondary | ICD-10-CM | POA: Diagnosis not present

## 2020-09-02 DIAGNOSIS — Z9049 Acquired absence of other specified parts of digestive tract: Secondary | ICD-10-CM | POA: Diagnosis not present

## 2020-09-02 DIAGNOSIS — E876 Hypokalemia: Secondary | ICD-10-CM | POA: Diagnosis not present

## 2020-09-02 DIAGNOSIS — Z4801 Encounter for change or removal of surgical wound dressing: Secondary | ICD-10-CM | POA: Diagnosis not present

## 2020-09-02 DIAGNOSIS — I110 Hypertensive heart disease with heart failure: Secondary | ICD-10-CM | POA: Diagnosis not present

## 2020-09-02 DIAGNOSIS — E440 Moderate protein-calorie malnutrition: Secondary | ICD-10-CM | POA: Diagnosis not present

## 2020-09-02 DIAGNOSIS — I5032 Chronic diastolic (congestive) heart failure: Secondary | ICD-10-CM | POA: Diagnosis not present

## 2020-09-02 DIAGNOSIS — Z433 Encounter for attention to colostomy: Secondary | ICD-10-CM | POA: Diagnosis not present

## 2020-09-02 DIAGNOSIS — Z8619 Personal history of other infectious and parasitic diseases: Secondary | ICD-10-CM | POA: Diagnosis not present

## 2020-09-02 DIAGNOSIS — E871 Hypo-osmolality and hyponatremia: Secondary | ICD-10-CM | POA: Diagnosis not present

## 2020-09-02 DIAGNOSIS — J449 Chronic obstructive pulmonary disease, unspecified: Secondary | ICD-10-CM | POA: Diagnosis not present

## 2020-09-02 DIAGNOSIS — Z48815 Encounter for surgical aftercare following surgery on the digestive system: Secondary | ICD-10-CM | POA: Diagnosis not present

## 2020-09-02 DIAGNOSIS — N179 Acute kidney failure, unspecified: Secondary | ICD-10-CM | POA: Diagnosis not present

## 2020-09-02 DIAGNOSIS — E1151 Type 2 diabetes mellitus with diabetic peripheral angiopathy without gangrene: Secondary | ICD-10-CM | POA: Diagnosis not present

## 2020-09-05 DIAGNOSIS — Z9049 Acquired absence of other specified parts of digestive tract: Secondary | ICD-10-CM | POA: Diagnosis not present

## 2020-09-05 DIAGNOSIS — Z4801 Encounter for change or removal of surgical wound dressing: Secondary | ICD-10-CM | POA: Diagnosis not present

## 2020-09-05 DIAGNOSIS — N179 Acute kidney failure, unspecified: Secondary | ICD-10-CM | POA: Diagnosis not present

## 2020-09-05 DIAGNOSIS — E1151 Type 2 diabetes mellitus with diabetic peripheral angiopathy without gangrene: Secondary | ICD-10-CM | POA: Diagnosis not present

## 2020-09-05 DIAGNOSIS — Z8619 Personal history of other infectious and parasitic diseases: Secondary | ICD-10-CM | POA: Diagnosis not present

## 2020-09-05 DIAGNOSIS — E440 Moderate protein-calorie malnutrition: Secondary | ICD-10-CM | POA: Diagnosis not present

## 2020-09-05 DIAGNOSIS — E876 Hypokalemia: Secondary | ICD-10-CM | POA: Diagnosis not present

## 2020-09-05 DIAGNOSIS — Z48815 Encounter for surgical aftercare following surgery on the digestive system: Secondary | ICD-10-CM | POA: Diagnosis not present

## 2020-09-05 DIAGNOSIS — J449 Chronic obstructive pulmonary disease, unspecified: Secondary | ICD-10-CM | POA: Diagnosis not present

## 2020-09-05 DIAGNOSIS — I5032 Chronic diastolic (congestive) heart failure: Secondary | ICD-10-CM | POA: Diagnosis not present

## 2020-09-05 DIAGNOSIS — Z433 Encounter for attention to colostomy: Secondary | ICD-10-CM | POA: Diagnosis not present

## 2020-09-05 DIAGNOSIS — E871 Hypo-osmolality and hyponatremia: Secondary | ICD-10-CM | POA: Diagnosis not present

## 2020-09-05 DIAGNOSIS — I110 Hypertensive heart disease with heart failure: Secondary | ICD-10-CM | POA: Diagnosis not present

## 2020-09-09 DIAGNOSIS — Z299 Encounter for prophylactic measures, unspecified: Secondary | ICD-10-CM | POA: Diagnosis not present

## 2020-09-09 DIAGNOSIS — Z433 Encounter for attention to colostomy: Secondary | ICD-10-CM | POA: Diagnosis not present

## 2020-09-09 DIAGNOSIS — E1151 Type 2 diabetes mellitus with diabetic peripheral angiopathy without gangrene: Secondary | ICD-10-CM | POA: Diagnosis not present

## 2020-09-09 DIAGNOSIS — E876 Hypokalemia: Secondary | ICD-10-CM | POA: Diagnosis not present

## 2020-09-09 DIAGNOSIS — I5032 Chronic diastolic (congestive) heart failure: Secondary | ICD-10-CM | POA: Diagnosis not present

## 2020-09-09 DIAGNOSIS — E440 Moderate protein-calorie malnutrition: Secondary | ICD-10-CM | POA: Diagnosis not present

## 2020-09-09 DIAGNOSIS — I110 Hypertensive heart disease with heart failure: Secondary | ICD-10-CM | POA: Diagnosis not present

## 2020-09-09 DIAGNOSIS — Z4801 Encounter for change or removal of surgical wound dressing: Secondary | ICD-10-CM | POA: Diagnosis not present

## 2020-09-09 DIAGNOSIS — K551 Chronic vascular disorders of intestine: Secondary | ICD-10-CM | POA: Diagnosis not present

## 2020-09-09 DIAGNOSIS — E871 Hypo-osmolality and hyponatremia: Secondary | ICD-10-CM | POA: Diagnosis not present

## 2020-09-09 DIAGNOSIS — Z8619 Personal history of other infectious and parasitic diseases: Secondary | ICD-10-CM | POA: Diagnosis not present

## 2020-09-09 DIAGNOSIS — Z48815 Encounter for surgical aftercare following surgery on the digestive system: Secondary | ICD-10-CM | POA: Diagnosis not present

## 2020-09-09 DIAGNOSIS — Z9049 Acquired absence of other specified parts of digestive tract: Secondary | ICD-10-CM | POA: Diagnosis not present

## 2020-09-09 DIAGNOSIS — N179 Acute kidney failure, unspecified: Secondary | ICD-10-CM | POA: Diagnosis not present

## 2020-09-09 DIAGNOSIS — J449 Chronic obstructive pulmonary disease, unspecified: Secondary | ICD-10-CM | POA: Diagnosis not present

## 2020-09-09 DIAGNOSIS — I509 Heart failure, unspecified: Secondary | ICD-10-CM | POA: Diagnosis not present

## 2020-09-15 DIAGNOSIS — Z933 Colostomy status: Secondary | ICD-10-CM | POA: Diagnosis not present

## 2020-09-16 DIAGNOSIS — M21372 Foot drop, left foot: Secondary | ICD-10-CM | POA: Diagnosis not present

## 2020-09-17 DIAGNOSIS — Z933 Colostomy status: Secondary | ICD-10-CM | POA: Diagnosis not present

## 2020-09-19 DIAGNOSIS — Z933 Colostomy status: Secondary | ICD-10-CM | POA: Diagnosis not present

## 2020-09-20 DIAGNOSIS — Z933 Colostomy status: Secondary | ICD-10-CM | POA: Diagnosis not present

## 2020-10-05 DIAGNOSIS — Z933 Colostomy status: Secondary | ICD-10-CM | POA: Diagnosis not present

## 2020-10-23 DIAGNOSIS — Z8719 Personal history of other diseases of the digestive system: Secondary | ICD-10-CM | POA: Diagnosis not present

## 2020-10-23 DIAGNOSIS — Z9049 Acquired absence of other specified parts of digestive tract: Secondary | ICD-10-CM | POA: Diagnosis not present

## 2020-10-23 DIAGNOSIS — R29898 Other symptoms and signs involving the musculoskeletal system: Secondary | ICD-10-CM | POA: Diagnosis not present

## 2020-10-23 DIAGNOSIS — Z933 Colostomy status: Secondary | ICD-10-CM | POA: Diagnosis not present

## 2020-12-06 DIAGNOSIS — Z933 Colostomy status: Secondary | ICD-10-CM | POA: Diagnosis not present

## 2021-01-02 DIAGNOSIS — Z933 Colostomy status: Secondary | ICD-10-CM | POA: Diagnosis not present

## 2021-02-03 DIAGNOSIS — Z933 Colostomy status: Secondary | ICD-10-CM | POA: Diagnosis not present

## 2021-03-05 DIAGNOSIS — Z933 Colostomy status: Secondary | ICD-10-CM | POA: Diagnosis not present

## 2021-03-28 IMAGING — CT CT ABD-PELV W/ CM
2 of 5 series · 15 of 46 positions shown, 17 images · IV contrast (omnipaque)
Comparison: CT 03/21/2020

CLINICAL DATA: 69-year-old female with a history of abdominal pain
and fever, status post aorto iliac surgery

EXAM:
CT ABDOMEN AND PELVIS WITH CONTRAST
TECHNIQUE: Multidetector CT imaging of the abdomen and pelvis was performed
using the standard protocol following bolus administration of
intravenous contrast.
CONTRAST:  75mL OMNIPAQUE IOHEXOL 300 MG/ML  SOLN

[Series 3: abdomen 5.0 · axial · 0.92mm/px · z∈[+907,+1292]mm · 12 of 89 slices shown, 14 images]
[im 6/89  soft-tissue]
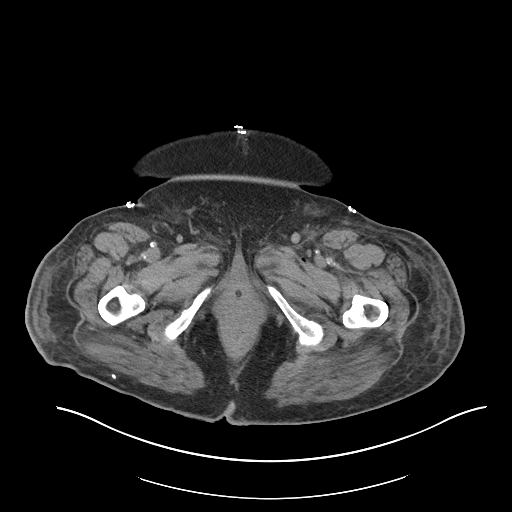
[im 6/89  bone]
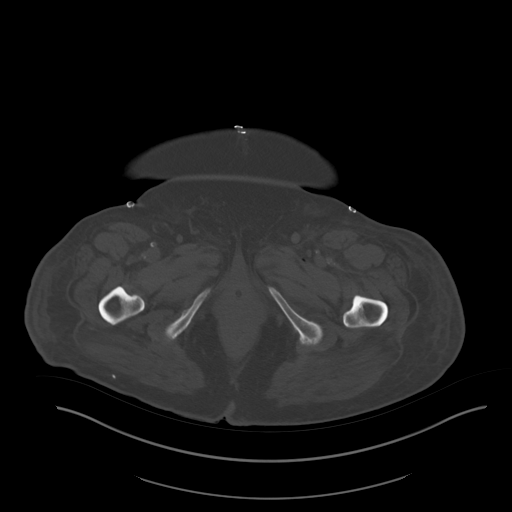
[im 12/89  soft-tissue]
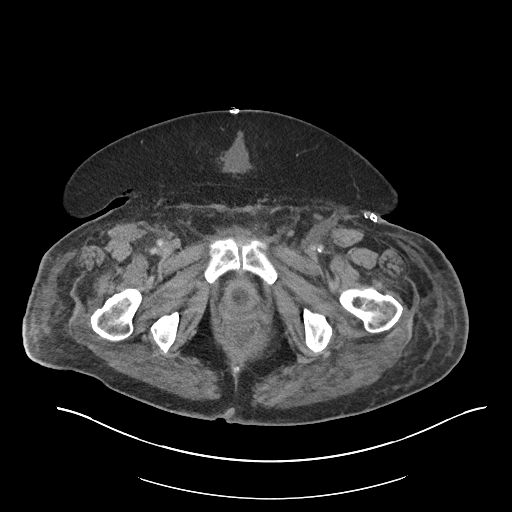
[im 18/89  soft-tissue]
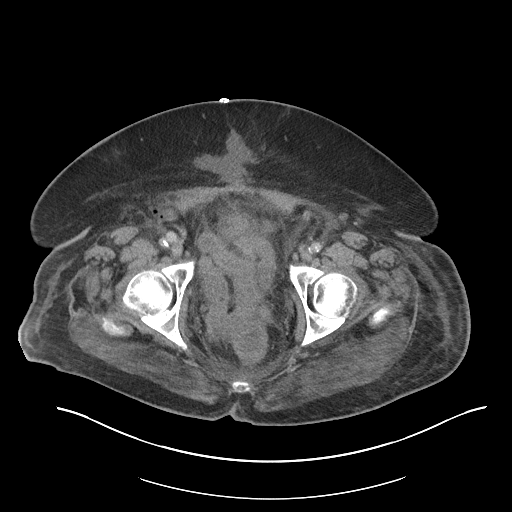
[im 30/89  soft-tissue]
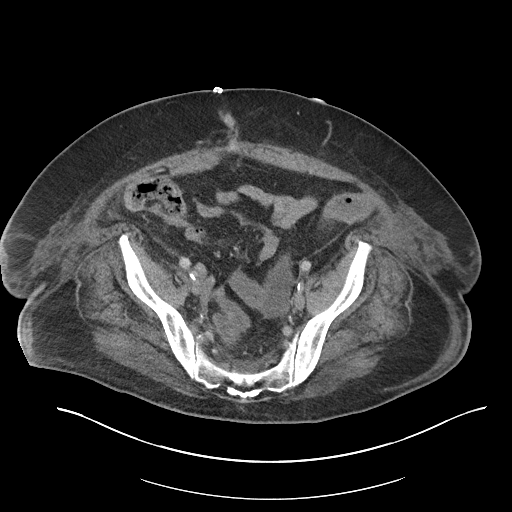
[im 36/89  soft-tissue]
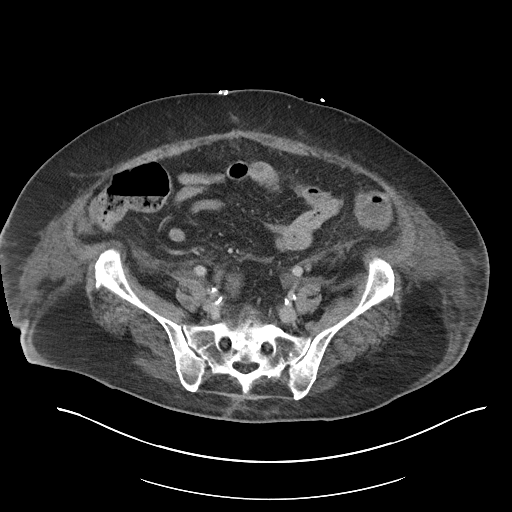
[im 42/89  soft-tissue]
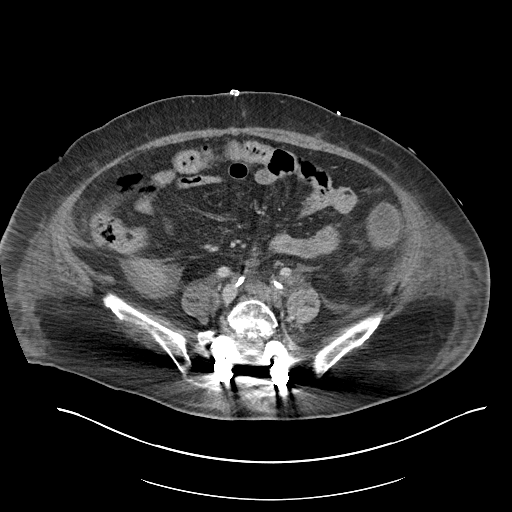
[im 47/89  soft-tissue]
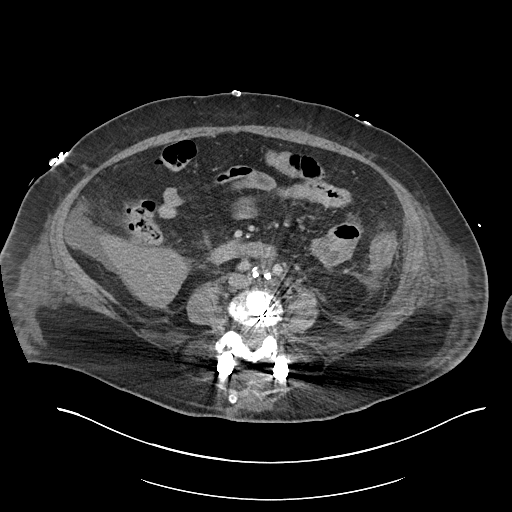
[im 53/89  soft-tissue]
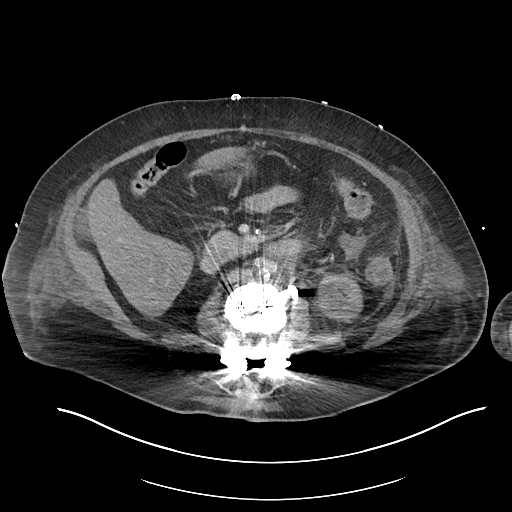
[im 59/89  soft-tissue]
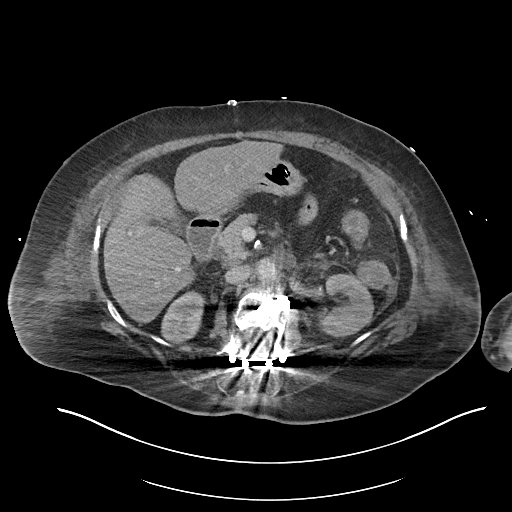
[im 59/89  bone]
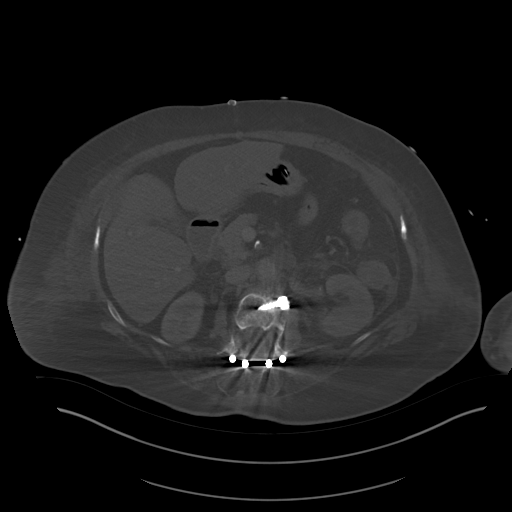
[im 71/89  soft-tissue]
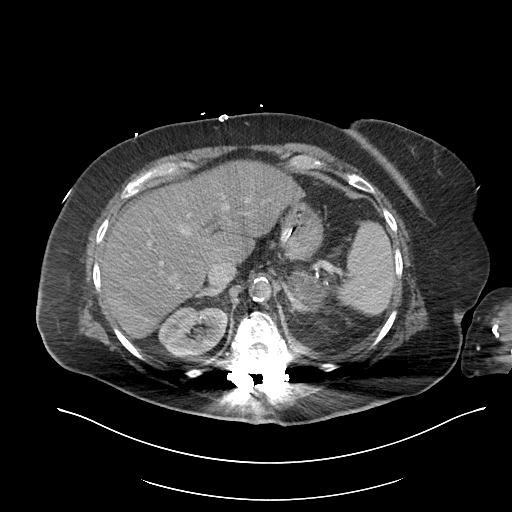
[im 77/89  soft-tissue]
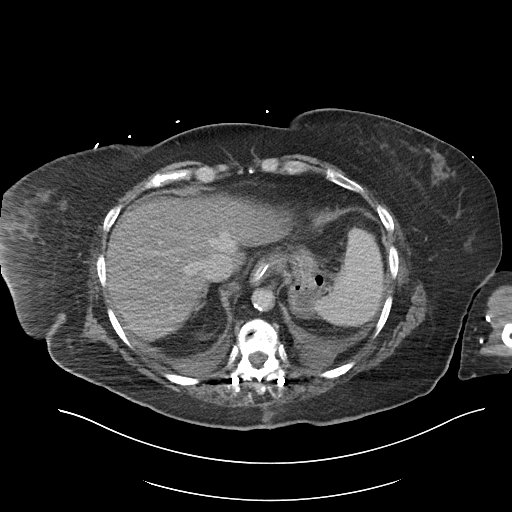
[im 83/89  soft-tissue]
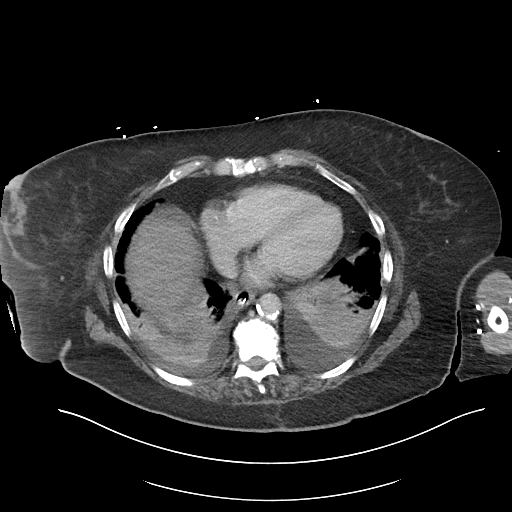

[Series 6: abdomen 3.0 mpr cor · coronal · 0.88mm/px · 3 of 97 slices shown]
[im 33/97  soft-tissue]
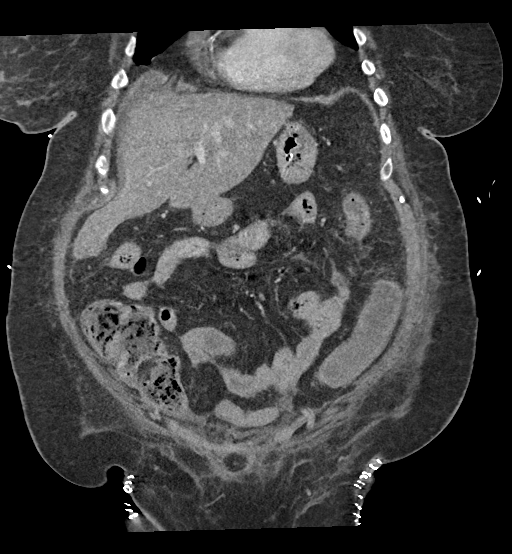
[im 43/97  soft-tissue]
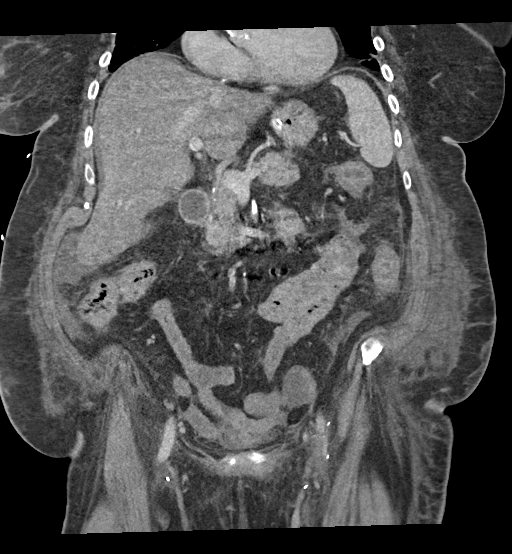
[im 54/97  soft-tissue]
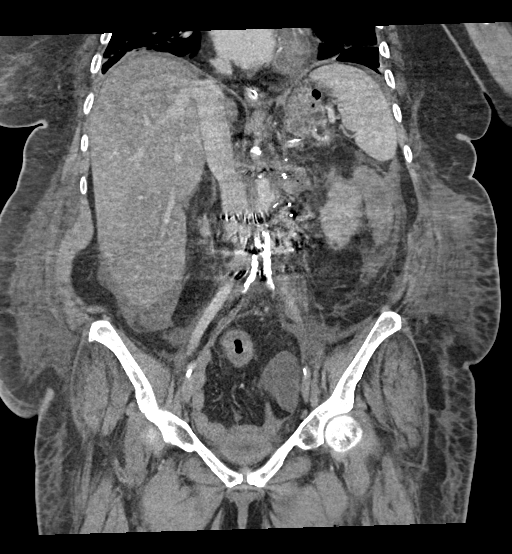

[15 of 46 positions shown; findings below may reference images not displayed]

FINDINGS: Lower chest: Small pleural effusions of the bilateral lung bases
with associated atelectasis.

Hepatobiliary: Unremarkable appearance of the liver.
Cholecystectomy.

Pancreas: Unremarkable

Spleen: Unremarkable

Adrenals/Urinary Tract:

- Right adrenal gland:  Unremarkable

- Left adrenal gland: Unremarkable.

- Right kidney: Relatively symmetric perfusion to the left kidney.
No hydronephrosis. Unremarkable right ureter. Vascular
calcifications in the hilum of the right kidney. No focal lesion.

- Left Kidney: Relatively symmetric perfusion to the right kidney.
No hydronephrosis. No focal lesion.

- Urinary Bladder: Urinary bladder relatively decompressed with a
balloon retention catheter in place.

Stomach/Bowel:

- Stomach: Gastric tube terminates within the stomach. Stomach is
decompressed.

- Small bowel:Small bowel appears relatively unremarkable without
distention, air-fluid levels, or transition. No focal wall
thickening

- Appendix: Appendix is not visualized, however, no inflammatory
changes are present adjacent to the cecum to indicate an
appendicitis.

- Colon: Proximal colon is decompressed with small stool burden.
Mucosa enhances of the right colon transverse colon to the splenic
flexure. At the splenic flexure and through the descending colon
there is mild circumferential wall thickening with decreased
attenuation of the colon wall, particularly within the sigmoid colon
on image 64. Decreased attenuation extends through the rectum. No
significant distention. No perforation.

Vascular/Lymphatic: Surgical changes of aorto bi-iliac bypass. The
proximal anastomosis is not well visualized given the streak
artifact, although just beyond the anastomosis the graft is patent
throughout its length. Both the left and the right limb of the graft
are patent to the distal anastomosis. The proximal femoral
vasculature remains patent.

Study is nondiagnostic for confident evaluation of any stenosis at
the proximal surgical anastomosis.

Atherosclerotic changes of the celiac artery origin and the SMA
artery origin, better characterized on prior CT angiogram.
Atherosclerotic changes at the bilateral renal artery origins,
better characterized on prior CT angiogram.

Reproductive: Hysterectomy.

Other: Postsurgical changes of the midline abdomen.

Surgical changes in the periaortic region without evidence of
extravasation of contrast or blood products. Trace fluid in the
right pericolic gutter. Trace fluid in the anatomic pelvis.

Musculoskeletal: Thoracolumbar fixation of T10-S1. This contributes
to significant streak artifact limiting evaluation of the canal, and
the retroperitoneum. No acute displaced fracture.
IMPRESSION: Interval surgical changes of aorto bi-iliac bypass. Although the
proximal bypass anastomosis is not well evaluated given the protocol
and the significant streak artifact, the bypass appears patent,
including the proximal aortic limb, and the bilateral iliac limbs.
Proximal femoral vasculature appears patent.

Decreased attenuation/enhancement of the left colon extending from
the splenic flexure through the sigmoid colon and rectum, concerning
for decreased perfusion/ischemia. No evidence of perforation.

Prelim results were discussed at the time of interpretation on
04/20/2020 at [DATE] with Dr. Abutii.

CT again demonstrates mesenteric and renal arterial disease, better
characterized on the previous CT angiogram 03/21/2020. Aortic
Atherosclerosis ([id number]).

Relatively symmetric perfusion of the kidneys.

Additional ancillary findings as above.

## 2021-03-29 IMAGING — DX DG CHEST 1V PORT
1 series · 1 of 1 positions shown · non-contrast
Comparison: 04/19/2020

CLINICAL DATA: 69-year-old female with respiratory failure

EXAM:
PORTABLE CHEST 1 VIEW

[chest ap]
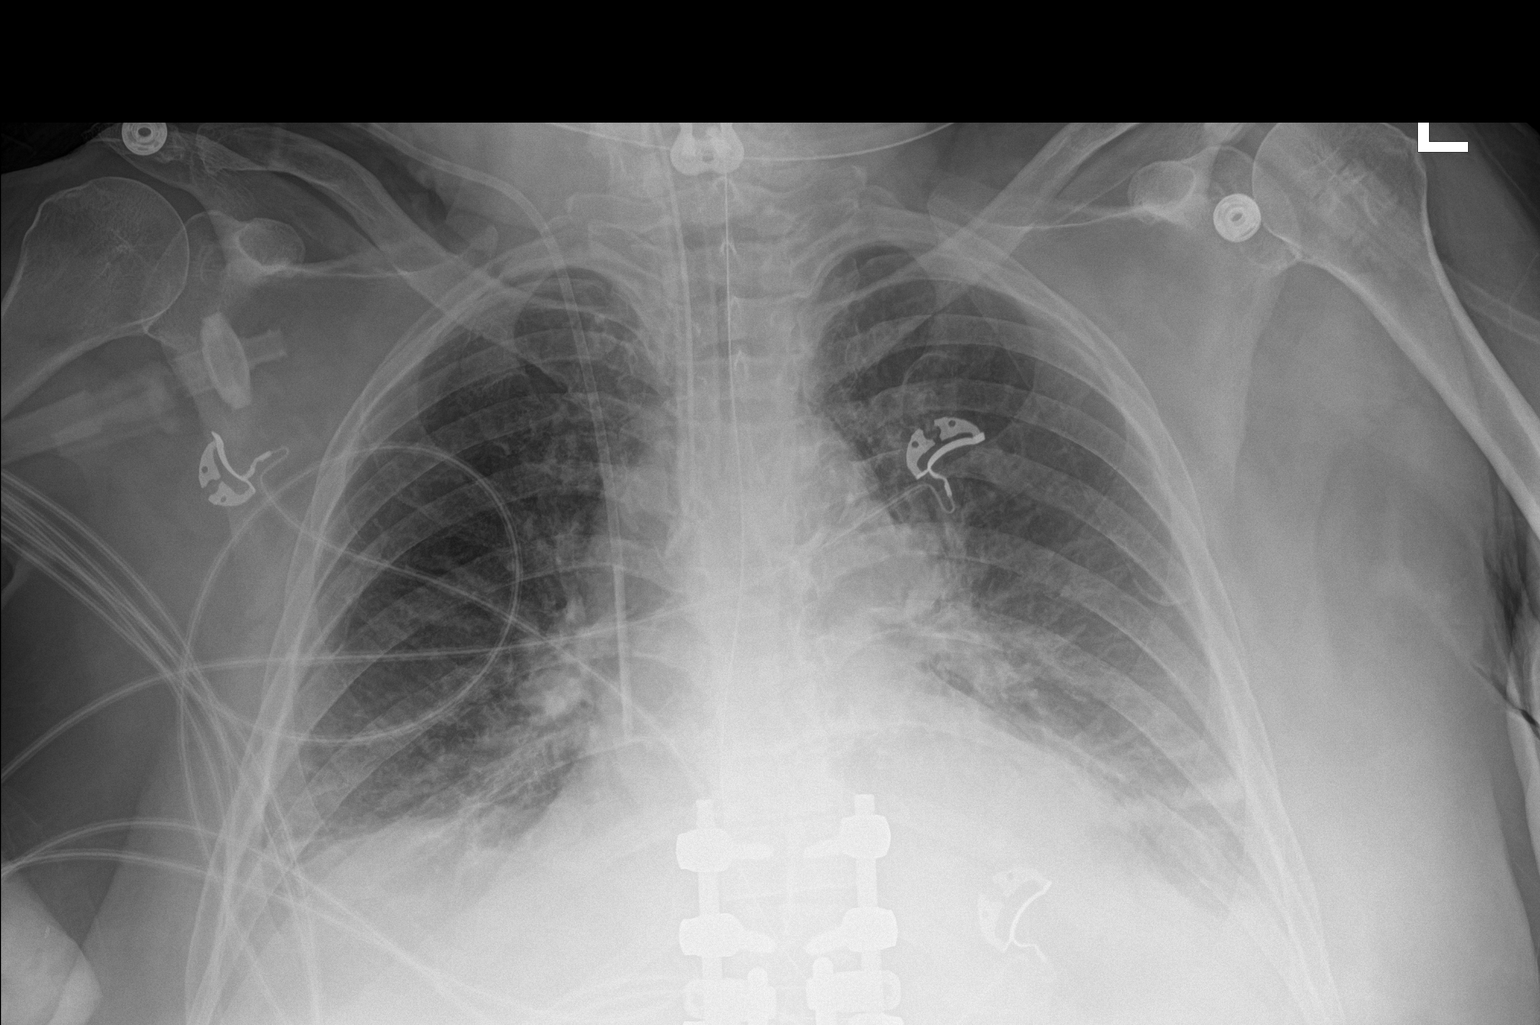

[1 of 1 positions shown; findings below may reference images not displayed]

FINDINGS: Cardiomediastinal silhouette unchanged.

Persistently low lung volumes with basilar opacities partially
obscuring the heart borders and hemidiaphragms.

Unchanged right IJ sheath.

Endotracheal tube terminates 2.1 cm above the carina, unchanged.

Gastric tube unchanged.

No pneumothorax.
IMPRESSION: Persistently low lung volumes with persisting basilar opacities,
potentially combination of atelectasis/consolidation, pleural fluid,
and/or local edema.

Unchanged right IJ central line.

Endotracheal tube terminates 2.1 cm above the carina.

Unchanged gastric tube.

## 2021-03-30 IMAGING — DX DG CHEST 1V PORT
1 series · 1 of 1 positions shown · non-contrast
Comparison: One-view chest x-ray 04/21/2020

CLINICAL DATA: Respiratory failure.

EXAM:
PORTABLE CHEST 1 VIEW

[chest ap]
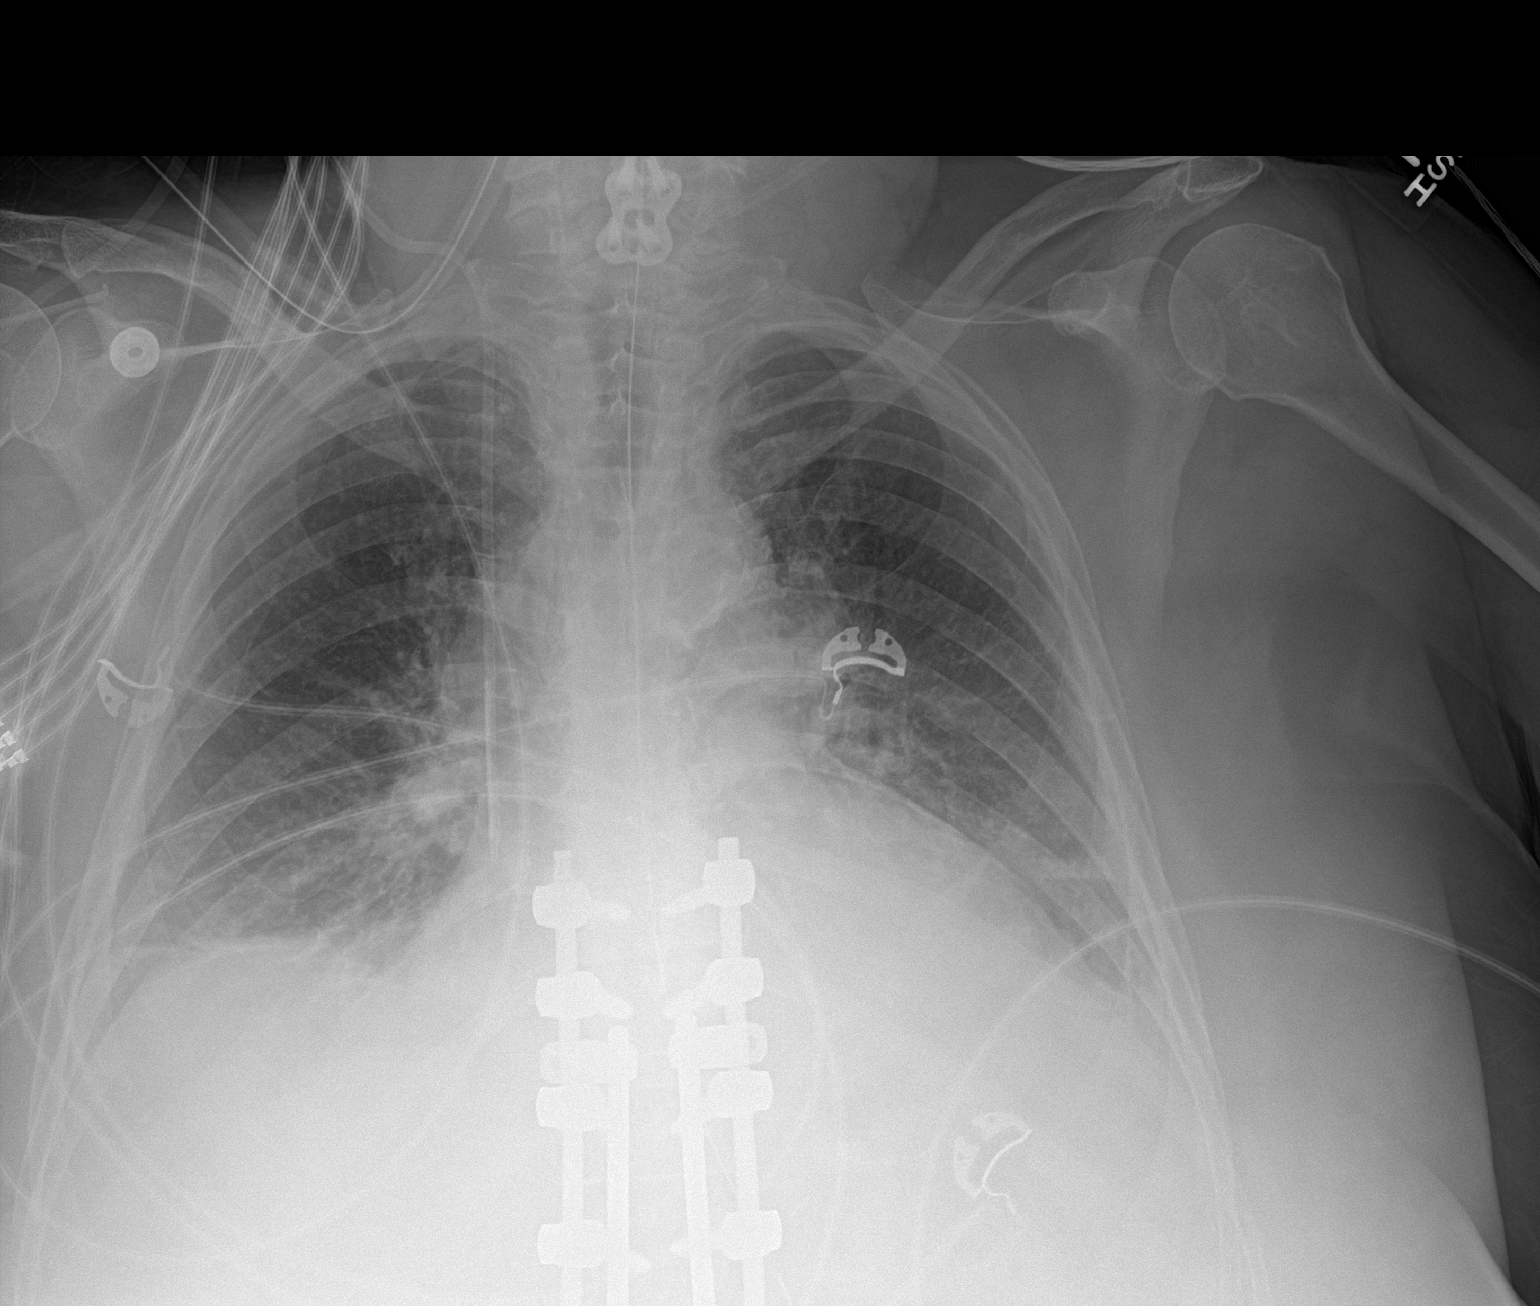

[1 of 1 positions shown; findings below may reference images not displayed]

FINDINGS: Patient has been extubated. NG tube courses off the inferior border
of the film. Right IJ line is stable.

Moderate pulmonary vascular congestion is slightly improved.
Bilateral effusions and basilar airspace opacities are again noted.
Aeration is slightly improved. Lung volumes remain low.
IMPRESSION: 1. Improving pulmonary vascular congestion and bilateral effusions.
2. Bibasilar airspace disease likely reflects atelectasis. Infection
is not excluded.

## 2021-04-11 ENCOUNTER — Other Ambulatory Visit: Payer: Self-pay | Admitting: Student

## 2021-04-11 MED ORDER — ATORVASTATIN CALCIUM 40 MG PO TABS: 40.0000 mg | ORAL_TABLET | Freq: Every day | ORAL | 1 refills | Status: AC

## 2021-05-05 DIAGNOSIS — Z933 Colostomy status: Secondary | ICD-10-CM | POA: Diagnosis not present

## 2021-05-19 ENCOUNTER — Other Ambulatory Visit: Payer: Self-pay | Admitting: Student

## 2021-05-22 DIAGNOSIS — Z933 Colostomy status: Secondary | ICD-10-CM | POA: Diagnosis not present

## 2021-06-20 DIAGNOSIS — Z9049 Acquired absence of other specified parts of digestive tract: Secondary | ICD-10-CM | POA: Diagnosis not present

## 2021-06-20 DIAGNOSIS — Z993 Dependence on wheelchair: Secondary | ICD-10-CM | POA: Diagnosis not present

## 2021-06-20 DIAGNOSIS — I509 Heart failure, unspecified: Secondary | ICD-10-CM | POA: Diagnosis not present

## 2021-06-20 DIAGNOSIS — Z933 Colostomy status: Secondary | ICD-10-CM | POA: Diagnosis not present

## 2021-06-20 DIAGNOSIS — I739 Peripheral vascular disease, unspecified: Secondary | ICD-10-CM | POA: Diagnosis not present

## 2021-06-21 ENCOUNTER — Other Ambulatory Visit: Payer: Self-pay | Admitting: Student

## 2021-06-23 DIAGNOSIS — Z933 Colostomy status: Secondary | ICD-10-CM | POA: Diagnosis not present

## 2021-06-30 ENCOUNTER — Other Ambulatory Visit: Payer: Self-pay

## 2021-06-30 ENCOUNTER — Ambulatory Visit (HOSPITAL_COMMUNITY)
Admission: RE | Admit: 2021-06-30 | Discharge: 2021-06-30 | Disposition: A | Discharge status: Home / Self Care | Payer: Medicare Other | Source: Ambulatory Visit | Attending: General Surgery | Admitting: General Surgery

## 2021-06-30 DIAGNOSIS — K9403 Colostomy malfunction: Secondary | ICD-10-CM | POA: Diagnosis not present

## 2021-06-30 DIAGNOSIS — K9400 Colostomy complication, unspecified: Secondary | ICD-10-CM

## 2021-06-30 DIAGNOSIS — Z993 Dependence on wheelchair: Secondary | ICD-10-CM | POA: Insufficient documentation

## 2021-06-30 NOTE — Progress Notes (Signed)
[facility]   Reason for visit:  Leaking ostomy, changing pouch daily.  Patient reports she is caring for the ostomy independently. She is wheelchair bound and unable to walk.   HPI:  Left hemicolectomy and colostomy one year ago.  She is tearful as surgery team was not optimistic about reversal and her quality of life is not as she would like. ROS  Review of Systems  Gastrointestinal:  Positive for abdominal pain.       Parastomal hernia and creasing to peristomal skin.   Musculoskeletal:  Positive for back pain and gait problem.  Psychiatric/Behavioral:  Positive for agitation.        Tearful, distraught.  Desires reversal and to be able to walk again.   All other systems reviewed and are negative. Vital signs:  BP (!) 190/56 (BP Location: Left Arm)   Pulse 72   Temp 97.6 F (36.4 C) (Oral)   Resp 20   Ht 5' 3"  (1.6 m)   SpO2 97%   BMI 30.77 kg/m  Exam:  Physical Exam Vitals reviewed.  Abdominal:     Hernia: A hernia is present.       Comments: Colostomy with frequent leaks.  Will stop convex pouch that is popping off.  Switching to 2 piece so she can remove pouch and leave barrier and not strip her skin.   Skin:    General: Skin is warm and dry.     Comments: Has history of "muscle damage" to sacral skin with intense itching.  Treats with Benadryl cream.  Skin intact  Neurological:     Mental Status: She is alert.     Gait: Gait abnormal.    Stoma type/location:  LLQ hernia with parastomal hernia and creasing in abdomen, but in distal pouching area Stomal assessment/size:  3/4" round, os at center, but creasing causes at dip at 3:00. Peristomal assessment:  Intact but erythematous.  Patient removes pouch 2-3 times daily.  She is paying out of pocket for pouches, as her insurance only covers 15/month and she is using much more than that.  Her husband voices frustration at this and I indicate that we will try an alternate pouch to see if we can improve wear  time and be more gentle on the skin.  Treatment options for stomal/peristomal skin: Today, will add barrier ring and switch to 2 piece pouch.  I bring in a belt, due to hernia, but she does not want to add this.  Output: Pouch is empty.  She says she has abdominal tenderness.  She had output yesterday, but a small amount.  She has not had stool softener in a few days.  I encourage her to take a laxative until she begins moving again, and then stop but resume daily softener.  I encourage her to drink more fluids.  Explaining that immobility and inadequate fluids can cause her to become constipated.  Ostomy pouching: 2pc. 2 1/4" pouch.  The convexity in her one piece was causing the pouch to pop off.  She pulls her pouch off this AM that was placed this morning and it is tightly adherent.  I explain that these bags are designed to last 2-4 days and this strong adhesive is stripping her skin by changing so often.  She is willing to try 2 piece pouch and demonstrates snapping the 2 together without difficulty.  Education provided:  Spoke with patient and husband regarding new pouch and obtaining supplies.     Impression/dx  Colostomy complication Discussion  Patient still needs presurgical clearance for ostomy reversal.  Barriers include cardiac history, immobility and risk for skin breakdown.  Plan  Call the office and let me know how new pouch is working.     Visit time: 60 minutes.   Domenic Moras FNP-BC

## 2021-06-30 NOTE — Discharge Instructions (Addendum)
Today, we are changing your bag to a 2 piece pouch with a barrier ring.   Snap the 2 pieces together like a tupperware bowl.  Add the barrier ring around the stoma Apply new pouch.  Try to empty when 1/3 full and only change every 3 days or if leaks.   It is stripping your skin to change twice daily.  If you like bags, I will call [facility] and get more bags ordered.  For your constipation, take laxative until stool is loose, then stool softener daily.  DRINK WATER!  Santiago Glad, [place]

## 2021-07-01 ENCOUNTER — Other Ambulatory Visit (HOSPITAL_COMMUNITY): Payer: Self-pay | Admitting: Nurse Practitioner

## 2021-07-01 DIAGNOSIS — K9400 Colostomy complication, unspecified: Secondary | ICD-10-CM

## 2021-07-10 DIAGNOSIS — Z933 Colostomy status: Secondary | ICD-10-CM | POA: Diagnosis not present

## 2021-07-30 DIAGNOSIS — Z933 Colostomy status: Secondary | ICD-10-CM | POA: Diagnosis not present

## 2021-08-07 DIAGNOSIS — Z933 Colostomy status: Secondary | ICD-10-CM | POA: Diagnosis not present

## 2021-08-30 DIAGNOSIS — Z933 Colostomy status: Secondary | ICD-10-CM | POA: Diagnosis not present

## 2021-09-29 DIAGNOSIS — Z933 Colostomy status: Secondary | ICD-10-CM | POA: Diagnosis not present

## 2021-10-29 DIAGNOSIS — Z933 Colostomy status: Secondary | ICD-10-CM | POA: Diagnosis not present

## 2021-12-01 DIAGNOSIS — Z933 Colostomy status: Secondary | ICD-10-CM | POA: Diagnosis not present

## 2021-12-30 DIAGNOSIS — Z933 Colostomy status: Secondary | ICD-10-CM | POA: Diagnosis not present

## 2022-01-28 DIAGNOSIS — Z933 Colostomy status: Secondary | ICD-10-CM | POA: Diagnosis not present

## 2022-03-02 DIAGNOSIS — Z933 Colostomy status: Secondary | ICD-10-CM | POA: Diagnosis not present

## 2022-04-02 DIAGNOSIS — Z933 Colostomy status: Secondary | ICD-10-CM | POA: Diagnosis not present

## 2022-05-02 DIAGNOSIS — Z933 Colostomy status: Secondary | ICD-10-CM | POA: Diagnosis not present

## 2022-05-08 ENCOUNTER — Encounter (HOSPITAL_COMMUNITY): Payer: Self-pay | Admitting: Nurse Practitioner

## 2022-05-15 ENCOUNTER — Encounter (HOSPITAL_COMMUNITY): Payer: Self-pay | Admitting: Nurse Practitioner

## 2022-05-26 ENCOUNTER — Encounter (HOSPITAL_COMMUNITY): Payer: Self-pay | Admitting: Nurse Practitioner

## 2022-06-03 DIAGNOSIS — Z933 Colostomy status: Secondary | ICD-10-CM | POA: Diagnosis not present

## 2022-06-26 DIAGNOSIS — Z933 Colostomy status: Secondary | ICD-10-CM | POA: Diagnosis not present

## 2022-08-03 DIAGNOSIS — Z933 Colostomy status: Secondary | ICD-10-CM | POA: Diagnosis not present

## 2022-09-02 DIAGNOSIS — Z933 Colostomy status: Secondary | ICD-10-CM | POA: Diagnosis not present

## 2022-10-02 DIAGNOSIS — Z933 Colostomy status: Secondary | ICD-10-CM | POA: Diagnosis not present

## 2022-11-04 DIAGNOSIS — Z933 Colostomy status: Secondary | ICD-10-CM | POA: Diagnosis not present

## 2022-12-05 DIAGNOSIS — Z933 Colostomy status: Secondary | ICD-10-CM | POA: Diagnosis not present

## 2023-01-02 DIAGNOSIS — Z933 Colostomy status: Secondary | ICD-10-CM | POA: Diagnosis not present

## 2023-02-04 DIAGNOSIS — Z933 Colostomy status: Secondary | ICD-10-CM | POA: Diagnosis not present

## 2023-03-05 DIAGNOSIS — Z933 Colostomy status: Secondary | ICD-10-CM | POA: Diagnosis not present

## 2023-04-05 DIAGNOSIS — Z933 Colostomy status: Secondary | ICD-10-CM | POA: Diagnosis not present

## 2023-05-06 DIAGNOSIS — Z933 Colostomy status: Secondary | ICD-10-CM | POA: Diagnosis not present

## 2023-06-04 DIAGNOSIS — Z933 Colostomy status: Secondary | ICD-10-CM | POA: Diagnosis not present

## 2023-07-02 DIAGNOSIS — Z933 Colostomy status: Secondary | ICD-10-CM | POA: Diagnosis not present

## 2023-09-16 ENCOUNTER — Telehealth: Payer: Self-pay

## 2023-09-16 NOTE — Patient Outreach (Signed)
[facility] Assistant attempted to call patient on today regarding preventative mammogram screening. No answer from patient after multiple rings. Assistant unable to leave confidential voicemail for patient to return call.  Will call back patient back for final attempt.  Baruch Gouty [facility] Assistant [facility] Population [facility] [phone]

## 2023-12-04 DEATH — deceased
